# Patient Record
Sex: Female | Born: 1940 | Race: White | Hispanic: No | Smoking: Former smoker
Health system: Southern US, Community
[De-identification: ages and names within clinical notes are randomized; demographics above are authoritative.]

## PROBLEM LIST (undated history)

## (undated) DIAGNOSIS — I10 Essential (primary) hypertension: Secondary | ICD-10-CM

## (undated) DIAGNOSIS — F0391 Unspecified dementia with behavioral disturbance: Secondary | ICD-10-CM

## (undated) DIAGNOSIS — J069 Acute upper respiratory infection, unspecified: Secondary | ICD-10-CM

## (undated) DIAGNOSIS — F32A Depression, unspecified: Secondary | ICD-10-CM

## (undated) DIAGNOSIS — F419 Anxiety disorder, unspecified: Secondary | ICD-10-CM

## (undated) DIAGNOSIS — I639 Cerebral infarction, unspecified: Secondary | ICD-10-CM

## (undated) DIAGNOSIS — K2270 Barrett's esophagus without dysplasia: Secondary | ICD-10-CM

## (undated) DIAGNOSIS — I209 Angina pectoris, unspecified: Secondary | ICD-10-CM

## (undated) DIAGNOSIS — I2510 Atherosclerotic heart disease of native coronary artery without angina pectoris: Secondary | ICD-10-CM

## (undated) DIAGNOSIS — F329 Major depressive disorder, single episode, unspecified: Secondary | ICD-10-CM

## (undated) DIAGNOSIS — E785 Hyperlipidemia, unspecified: Secondary | ICD-10-CM

## (undated) DIAGNOSIS — F39 Unspecified mood [affective] disorder: Secondary | ICD-10-CM

## (undated) DIAGNOSIS — I6930 Unspecified sequelae of cerebral infarction: Secondary | ICD-10-CM

## (undated) DIAGNOSIS — I4891 Unspecified atrial fibrillation: Secondary | ICD-10-CM

## (undated) DIAGNOSIS — D649 Anemia, unspecified: Secondary | ICD-10-CM

## (undated) DIAGNOSIS — J45909 Unspecified asthma, uncomplicated: Secondary | ICD-10-CM

## (undated) DIAGNOSIS — J40 Bronchitis, not specified as acute or chronic: Secondary | ICD-10-CM

## (undated) DIAGNOSIS — I499 Cardiac arrhythmia, unspecified: Secondary | ICD-10-CM

## (undated) DIAGNOSIS — F22 Delusional disorders: Secondary | ICD-10-CM

## (undated) DIAGNOSIS — K219 Gastro-esophageal reflux disease without esophagitis: Secondary | ICD-10-CM

## (undated) DIAGNOSIS — C801 Malignant (primary) neoplasm, unspecified: Secondary | ICD-10-CM

## (undated) HISTORY — DX: Unspecified sequelae of cerebral infarction: I69.30

## (undated) HISTORY — DX: Barrett's esophagus without dysplasia: K22.70

## (undated) HISTORY — DX: Angina pectoris, unspecified: I20.9

## (undated) HISTORY — DX: Atherosclerotic heart disease of native coronary artery without angina pectoris: I25.10

## (undated) HISTORY — PX: PARTIAL COLECTOMY: SHX5273

## (undated) HISTORY — PX: CARDIAC CATHETERIZATION: SHX172

## (undated) HISTORY — DX: Malignant (primary) neoplasm, unspecified: C80.1

## (undated) HISTORY — PX: CAROTID ENDARTERECTOMY: SUR193

## (undated) HISTORY — DX: Anemia, unspecified: D64.9

## (undated) HISTORY — DX: Unspecified atrial fibrillation: I48.91

## (undated) HISTORY — DX: Unspecified asthma, uncomplicated: J45.909

## (undated) HISTORY — PX: CHOLECYSTECTOMY: SHX55

## (undated) HISTORY — DX: Hyperlipidemia, unspecified: E78.5

## (undated) HISTORY — PX: ABDOMINAL HYSTERECTOMY: SHX81

## (undated) HISTORY — DX: Unspecified mood (affective) disorder: F39

## (undated) HISTORY — DX: Delusional disorders: F22

## (undated) HISTORY — DX: Unspecified dementia with behavioral disturbance: F03.91

---

## 2005-03-01 ENCOUNTER — Encounter: Payer: Self-pay | Admitting: Gastroenterology

## 2005-04-14 ENCOUNTER — Encounter: Payer: Self-pay | Admitting: Gastroenterology

## 2005-05-25 ENCOUNTER — Encounter: Payer: Self-pay | Admitting: Gastroenterology

## 2005-08-09 ENCOUNTER — Encounter: Payer: Self-pay | Admitting: Gastroenterology

## 2006-02-05 ENCOUNTER — Emergency Department (HOSPITAL_COMMUNITY): Admission: EM | Admit: 2006-02-05 | Discharge: 2006-02-05 | Payer: Self-pay | Admitting: Family Medicine

## 2006-02-16 ENCOUNTER — Inpatient Hospital Stay (HOSPITAL_COMMUNITY)
Admission: RE | Admit: 2006-02-16 | Discharge: 2006-03-04 | Payer: Self-pay | Admitting: Physical Medicine & Rehabilitation

## 2006-02-16 ENCOUNTER — Ambulatory Visit: Payer: Self-pay | Admitting: Physical Medicine & Rehabilitation

## 2006-07-31 ENCOUNTER — Observation Stay (HOSPITAL_COMMUNITY): Admission: EM | Admit: 2006-07-31 | Discharge: 2006-08-01 | Payer: Self-pay | Admitting: Emergency Medicine

## 2007-04-03 ENCOUNTER — Ambulatory Visit: Payer: Self-pay | Admitting: Vascular Surgery

## 2010-04-15 ENCOUNTER — Encounter (INDEPENDENT_AMBULATORY_CARE_PROVIDER_SITE_OTHER): Payer: Self-pay | Admitting: *Deleted

## 2010-05-26 ENCOUNTER — Ambulatory Visit: Payer: Self-pay | Admitting: Gastroenterology

## 2010-06-04 ENCOUNTER — Encounter (INDEPENDENT_AMBULATORY_CARE_PROVIDER_SITE_OTHER): Payer: Self-pay | Admitting: *Deleted

## 2010-06-04 ENCOUNTER — Encounter: Payer: Self-pay | Admitting: Gastroenterology

## 2010-06-04 DIAGNOSIS — K2270 Barrett's esophagus without dysplasia: Secondary | ICD-10-CM | POA: Insufficient documentation

## 2010-07-29 ENCOUNTER — Ambulatory Visit: Payer: Self-pay | Admitting: Gastroenterology

## 2010-07-29 DIAGNOSIS — D649 Anemia, unspecified: Secondary | ICD-10-CM

## 2010-07-29 HISTORY — DX: Anemia, unspecified: D64.9

## 2010-07-29 LAB — CONVERTED CEMR LAB
Basophils Absolute: 0 10*3/uL (ref 0.0–0.1)
Eosinophils Absolute: 0.2 10*3/uL (ref 0.0–0.7)
HCT: 37.9 % (ref 36.0–46.0)
Hemoglobin: 12.9 g/dL (ref 12.0–15.0)
Lymphs Abs: 1.2 10*3/uL (ref 0.7–4.0)
MCHC: 33.9 g/dL (ref 30.0–36.0)
MCV: 86.3 fL (ref 78.0–100.0)
Monocytes Absolute: 0.4 10*3/uL (ref 0.1–1.0)
Neutro Abs: 3.6 10*3/uL (ref 1.4–7.7)
RDW: 13.6 % (ref 11.5–14.6)

## 2010-10-24 ENCOUNTER — Emergency Department (HOSPITAL_COMMUNITY): Admission: EM | Admit: 2010-10-24 | Discharge: 2010-10-24 | Payer: Self-pay | Admitting: Emergency Medicine

## 2010-12-29 NOTE — Procedures (Signed)
Summary: EGD/[facility]  [facility]   Imported By: Sherian Rein 06/04/2010 09:06:46  _____________________________________________________________________  External Attachment:    Type:   Image     Comment:   External Document

## 2010-12-29 NOTE — Procedures (Signed)
Summary: EGD/[facility]  [facility]   Imported By: Sherian Rein 06/04/2010 09:09:05  _____________________________________________________________________  External Attachment:    Type:   Image     Comment:   External Document

## 2010-12-29 NOTE — Procedures (Signed)
Summary: EGD/[facility]  [facility]   Imported By: Sherian Rein 06/04/2010 09:07:53  _____________________________________________________________________  External Attachment:    Type:   Image     Comment:   External Document

## 2010-12-29 NOTE — Letter (Signed)
Summary: New Patient letter  [facility]  [address] [place], [place] [address]   Phone: [phone]  Fax: [phone]       04/15/2010 MRN: [id number]  [facility] [address] [place], [place]  [address]  Dear Ms. Cindy [name],  Welcome to the Gastroenterology Division at [facility].    You are scheduled to see Dr.  Christella Hartigan on 05-26-10 at 10:30am on the 3rd floor at [facility], [address]  We ask that you try to arrive at our office 15 minutes prior to your appointment time to allow for check-in.  We would like you to complete the enclosed self-administered evaluation form prior to your visit and bring it with you on the day of your appointment.  We will review it with you.  Also, please bring a complete list of all your medications or, if you prefer, bring the medication bottles and we will list them.  Please bring your insurance card so that we may make a copy of it.  If your insurance requires a referral to see a specialist, please bring your referral form from your primary care physician.  Co-payments are due at the time of your visit and may be paid by cash, check or credit card.     Your office visit will consist of a consult with your physician (includes a physical exam), any laboratory testing he/she may order, scheduling of any necessary diagnostic testing (e.g. x-ray, ultrasound, CT-scan), and scheduling of a procedure (e.g. Endoscopy, Colonoscopy) if required.  Please allow enough time on your schedule to allow for any/all of these possibilities.    If you cannot keep your appointment, please call [phone] to cancel or reschedule prior to your appointment date.  This allows [place] the opportunity to schedule an appointment for another patient in need of care.  If you do not cancel or reschedule by 5 p.m. the business day prior to your appointment date, you will be charged a $50.00 late cancellation/no-show fee.    Thank you for choosing  [facility] for your medical needs.  We appreciate the opportunity to care for you.  Please visit [place] at our website  to learn more about our practice.                     Sincerely,                                                             The Gastroenterology Division

## 2010-12-29 NOTE — Procedures (Signed)
Summary: Colonoscopy/[facility]   Imported By: Sherian Rein 06/04/2010 09:10:10  _____________________________________________________________________  External Attachment:    Type:   Image     Comment:   External Document

## 2010-12-29 NOTE — Miscellaneous (Signed)
Summary: CBC  Clinical Lists Changes  Problems: Added new problem of ANEMIA (ICD-285.9) Orders: Added new Test order of TLB-CBC Platelet - w/Differential (85025-CBCD) - Signed

## 2010-12-29 NOTE — Miscellaneous (Signed)
Summary: Orders Update/EGD  Clinical Lists Changes  Problems: Added new problem of BARRETTS ESOPHAGUS (ICD-530.85) Orders: Added new Test order of EGD (EGD) - Signed

## 2010-12-29 NOTE — Procedures (Signed)
Summary: Upper Endoscopy  Patient: Cindy [name] Note: All result statuses are Final unless otherwise noted.  Tests: (1) Upper Endoscopy (EGD)   EGD Upper Endoscopy       DONE     [facility]     [address]     [place], [place]  [address]           ENDOSCOPY PROCEDURE REPORT     PATIENT:  Cindy, [name]  MR#:  [id number]     BIRTHDATE:  [birth year], 68 yrs. old  GENDER:  female     ENDOSCOPIST:  Rachael Fee, MD     Referred by:  Florentina Jenny, M.D.     PROCEDURE DATE:  07/29/2010     PROCEDURE:  EGD with biopsy     ASA CLASS:  Class II     INDICATIONS:  originally referred for routine colonoscopy.     However after obtaining and reviewing previous GI procedures I     determined next colonoscopy was not needed until 02/2015.  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  The LB GIF-H180 [id number] endoscope was     introduced through the mouth and advanced to the second portion of     the duodenum, without limitations.  The instrument was slowly     withdrawn as the mucosa was fully examined.     <<PROCEDUREIMAGES>>     There was a short segment (1-2cm) of non-nodular, Barrett's     appearing changes at GE junction. Several biopsies were taken to     check for dysplasia (see image1 and image8).  A hiatal hernia was     found. This was 3-4cm (see image2).  There was friable GAVE-like     changes in distal stomach that oozed blood with minor scope trauma     (see image3).  Otherwise the examination was normal (see image4,     image5, and image7).    Retroflexed views revealed no     abnormalities.    The scope was then withdrawn from the patient     and the procedure completed.     COMPLICATIONS:  None           ENDOSCOPIC IMPRESSION:     1) Non-nodular Barrett's changes (short segment) in distal     esophagus: biopsied     2) Hiatal hernia     3) Friable GAVE-like changes in distal stomach     4) Otherwise normal examination           RECOMMENDATIONS:     Will get a CBC today to see if she is anemic.  The GAVE changes     in distal stomach could certainly cause/contribute to anemia.  If     she is signficantly anemic, would likely arrange repeat EGD at [facility]     to treat with APC. This would have to be done off of coumadin.     She should stay on iron supplement daily, indefinitely.  ______________________________     Rachael Fee, MD           n.     eSIGNED:   Rachael Fee at 07/29/2010 11:05 AM           Sherolyn Buba, [id number]  Note: An exclamation mark (!) indicates a result that was not dispersed into the flowsheet. Document Creation Date: 07/29/2010 11:05 AM _______________________________________________________________________  (1) Order result status: Final Collection or observation date-time: 07/29/2010 10:53 Requested date-time:  Receipt date-time:  Reported date-time:  Referring Physician:   Ordering Physician: Rob Bunting [id number]) Specimen Source:  Source: Launa Grill Order Number: [id number] Lab site:

## 2010-12-29 NOTE — Letter (Signed)
Summary: EGD Instructions  [facility]  [address] [place], [place] [address]   Phone: [phone]  Fax: [phone]       Cindy [name]    05-17-1941    MRN: [id number]       Procedure Day /Date:07/29/10  WED     Arrival Time: 10 am     Procedure Time:11 am     Location of Procedure:                    [facility] (4th Floor)    PREPARATION FOR ENDOSCOPY   On 07/29/10  THE DAY OF THE PROCEDURE:  1.   No solid foods, milk or milk products are allowed after midnight the night before your procedure.  2.   Do not drink anything colored red or purple.  Avoid juices with pulp.  No orange juice.  3.  You may drink clear liquids until 9 am , which is 2 hours before your procedure.                                                                                                CLEAR LIQUIDS INCLUDE: Water Jello Ice Popsicles Tea (sugar ok, no milk/cream) Powdered fruit flavored drinks Coffee (sugar ok, no milk/cream) Gatorade Juice: apple, white grape, white cranberry  Lemonade Clear bullion, consomm, broth Carbonated beverages (any kind) Strained chicken noodle soup Hard Candy   MEDICATION INSTRUCTIONS  Unless otherwise instructed, you should take regular prescription medications with a small sip of water as early as possible the morning of your procedure.  Diabetic patients - see separate instructions.    Stay on Coumadin per Dr Christella Hartigan               OTHER INSTRUCTIONS  You will need a responsible adult at least 72 years of age to accompany you and drive you home.   This person must remain in the waiting room during your procedure.  Wear loose fitting clothing that is easily removed.  Leave jewelry and other valuables at home.  However, you may wish to bring a book to read or an iPod/MP3 player to listen to music as you wait for your procedure to start.  Remove all body piercing jewelry and leave at home.  Total time from  sign-in until discharge is approximately 2-3 hours.  You should go home directly after your procedure and rest.  You can resume normal activities the day after your procedure.  The day of your procedure you should not:   Drive   Make legal decisions   Operate machinery   Drink alcohol   Return to work  You will receive specific instructions about eating, activities and medications before you leave.    The above instructions have been reviewed and explained to me by   _______________________    I fully understand and can verbalize these instructions _____________________________ Date _________     Appended Document: EGD Instructions faxed to Attn 3rd floor [facility] living  [phone]  pt and staff aware

## 2010-12-29 NOTE — Assessment & Plan Note (Signed)
History of Present Illness Visit Type: Initial Consult Primary GI MD: Rob Bunting MD Requesting Provider: Florentina Jenny, MD Chief Complaint: colon screening on coumadin History of Present Illness:     very pleasant 70 year old woman who had a colonoscopy 5 years ago, she was told she had a hemorrhoids (this was in [place], done at  [facility]).  No polyps were noted.  No cancers.  She was told she needed another colonoscopy at 5 year interval and she was referred here to discuss that recommendation.  she has had several other colonoscopies in the past, cannot recall ever having polyp removed.    She has had no changes in her bowel, no bleeding, no constipation or bleeding.  she is on coumadin due to stroke.            Current Medications (verified): 1)  Acetaminophen 325 Mg Tabs (Acetaminophen) .... Every Evening 2)  Alendronate Sodium 70 Mg Tabs (Alendronate Sodium) .... Once Weekly 3)  Aspirin 81 Mg Tbec (Aspirin) .... Once Daily 4)  Citracal/vitamin D 250-200 Mg-Unit Tabs (Calcium Citrate-Vitamin D) .... Once Daily 5)  Klonopin 0.5 Mg Tabs (Clonazepam) .... Once Daily 6)  Depakote 125 Mg Tbec (Divalproex Sodium) .... At Bedtime 7)  Ferrous Sulfate 325 (65 Fe) Mg Tabs (Ferrous Sulfate) .... Once Daily 8)  Fluticasone Propionate 50 Mcg/act Susp (Fluticasone Propionate) .... 2 Puff Each Nostril Once Daily 9)  Lisinopril 10 Mg Tabs (Lisinopril) .... Once Daily 10)  Metoprolol Tartrate 50 Mg Tabs (Metoprolol Tartrate) .... Take 1 1/2 Tablet By Mouth  Every Morning 11)  Omeprazole 20 Mg Cpdr (Omeprazole) .... Take 2 Caps Daily 12)  Sertraline Hcl 100 Mg  Tabs (Sertraline Hcl) .... Qhs 13)  Proair Hfa 108 (90 Base) Mcg/act Aers (Albuterol Sulfate) .... Use Daily As Needed 14)  Simvastatin 40 Mg Tabs (Simvastatin) .... Once Daily 15)  Trazodone Hcl 100 Mg Tabs (Trazodone Hcl) .... Two Times A Day 16)  Coumadin 3 Mg Tabs (Warfarin Sodium) .... As Directed 17)   Ambien 5 Mg Tabs (Zolpidem Tartrate) .... At Bedtime  Allergies (verified): No Known Drug Allergies  Past History:  Past Medical History: stroke Elevated cholesterol Anxiety Carotid endarterectomy in 2007 Ischemic stroke Hypertension  Past Surgical History: small bowel resection due to ischemic event of some sort appy cholecystectomy hysterectomy carotid endarterectomy  Family History: no colon cancer  Social History: nondrinker Does not smoke cigarettes  Review of Systems       Pertinent positive and negative review of systems were noted in the above HPI and GI specific review of systems.  All other review of systems was otherwise negative.   Vital Signs:  Patient profile:   70 year old female Height:      60 inches Weight:      201.25 pounds BMI:     39.45 Pulse rate:   60 / minute Pulse rhythm:   regular BP sitting:   132 / 60  (left arm) Cuff size:   regular  Vitals Entered By: June McMurray CMA Duncan Dull) (May 26, 2010 10:12 AM)  Physical Exam  Additional Exam:  Constitutional: generally well appearing Psychiatric: alert and oriented times 3 Eyes: extraocular movements intact Mouth: oropharynx moist, no lesions Neck: supple, no lymphadenopathy Cardiovascular: heart regular rate and rythm Lungs: CTA bilaterally Abdomen: soft, non-tender, non-distended, no obvious ascites, no peritoneal signs, normal bowel sounds Extremities: no lower extremity edema bilaterally Skin: no lesions on visible extremities    Impression & Recommendations:  Problem # 1:  routine risk for colon cancer she believes she had a colonoscopy in April 2006. She has had several colonoscopies prior to that. She does not recall ever being told that she had colon polyps and certainly no colon cancer. The best that I can tell, she is at her routine risk for colon cancer. We will get records from her previous gastroenterologist sent over and if that is indeed the case, then she does not  require another colonoscopy until 2016.  If however, she has had polyps removed in the past, then five-year interval is most appropriate. If that were the case then we would have to contact her primary care physician about holding her Coumadin for 5 days prior to a colonoscopy.  Patient Instructions: 1)  We will get records from Dr. Hoyle Barr office to review your previous colonoscopy reports, pathology. This will determine when you need another rountine colonoscopy for cancer screening. 2)  A copy of this information will be sent to Dr. Florentina Jenny. 3)  The medication list was reviewed and reconciled.  All changed / newly prescribed medications were explained.  A complete medication list was provided to the patient / caregiver.  Appended Document:  received faxed notes: several EGDs and one colonoscoyp from 2005/2006.  she needs recall colonoscopy in 02/2015 for routine colon cancer screening. She has short segment Barretts and GAVE (treated with APC) proven on previous EGDs, the last EGD was in 2006.  patty, she will need repeat EGD in [facility] (for Barrett's surveillance). She can stay on the coumadin, but will need an INR checked the day prior to EGD to make sure her level isn't too high.  Appended Document: Recall colon   Clinical Lists Changes  Observations: Added new observation of COLONNXTDUE: 02/2015 (06/02/2010 14:50)      Appended Document:  pt will be scheduled for EGD and remain on coumadin  I spoke with the pt and her aide at [facility] assisting living and the instructions will be faxed to them attn tina. 3rd floor  [phone]

## 2010-12-29 NOTE — Procedures (Signed)
Summary: EGD/[facility]  [facility]   Imported By: Sherian Rein 06/04/2010 09:11:08  _____________________________________________________________________  External Attachment:    Type:   Image     Comment:   External Document

## 2011-02-09 LAB — CBC
MCHC: 31.9 g/dL (ref 30.0–36.0)
Platelets: 168 10*3/uL (ref 150–400)
RDW: 13.1 % (ref 11.5–15.5)
WBC: 5.5 10*3/uL (ref 4.0–10.5)

## 2011-02-09 LAB — BASIC METABOLIC PANEL
BUN: 14 mg/dL (ref 6–23)
Creatinine, Ser: 0.95 mg/dL (ref 0.4–1.2)
GFR calc non Af Amer: 58 mL/min — ABNORMAL LOW (ref >60)

## 2011-02-09 LAB — PROTIME-INR: INR: 1.84 — ABNORMAL HIGH (ref 0.00–1.49)

## 2011-02-09 LAB — POCT CARDIAC MARKERS

## 2011-04-16 NOTE — Discharge Summary (Signed)
NAMEJANAIAH, Cindy [name] NO.:  [id number]   MEDICAL RECORD NO.:  [id number]          PATIENT TYPE:  IPS   LOCATION:  4027                         FACILITY:  [facility]   PHYSICIAN:  Ranelle Oyster, M.D.DATE OF BIRTH:  1941-04-15   DATE OF ADMISSION:  02/16/2006  DATE OF DISCHARGE:  03/04/2006                                 DISCHARGE SUMMARY   DISCHARGE DIAGNOSES:  1.  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  2.  Pain management.  3.  Hypertension.  4.  Hyperlipidemia.  5.  History of transient ischemic attack.  6.  Anxiety with depression.  7.  Irritable bowel syndrome.   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  The  patient seen at [facility] for planned left carotid surgery.  Preoperative stress test positive.  Cardiac catheterization per Dr. Lodema Hong  revealed a 50% left main and circumflex disease.  She was cleared for  surgery.  Underwent left carotid surgery March 15 per Dr. Keitha Butte.  Intraoperative right-sided weakness.  Underwent coronary angiogram with no  obvious embolism and intraoperative urokinase was given.  Postoperative  cranial CT scan with watershed ischemic infarction.  Follow-up neurology  services, Dr. Lynnea Ferrier, and placed on Aggrenox therapy.  Follow-up cranial CT  scan negative for hemorrhage.  Blood pressure monitored with metoprolol and  Altace.  She was admitted for comprehensive rehab program.   PAST MEDICAL HISTORY:  1.  See discharge diagnoses.  2.  Remote smoker.  3.  No alcohol.   SOCIAL HISTORY:  Lives alone in [place],  [place].  Daughter in [place] but  can assist as needed.  No other local family.   ALLERGIES:  NONE.   MEDICATIONS:  Prior to admission, unknown.   REHABILITATION HOSPITAL COURSE:  The patient was admitted to inpatient rehab  services with therapies initiated on a b.i.d. basis consisting of physical  therapy, occupational therapy, speech therapy and rehabilitation nursing.  The following issues were addressed during the patient's rehabilitation  stay.  Pertaining to Mrs. Commisso's watershed ischemic infarction, right-sided  weakness and aphasia after carotid surgery.  She continued to progress in  all areas.  Strength was graded 3+ to 4/5, right lower extremity, 3+/5 right  lower extremity.  She was now close supervision for ambulation, needing some  cues for safety.  Side rails had been up x 4 for patient safety and limited  awareness.  She was minimal assist for bathing and dressing.  She had a 50%  comprehension for short sentences.  She remained on Aggrenox for CVA  prophylaxis.  Subcutaneous Lovenox for deep vein thrombosis prophylaxis was  discontinued at time of discharge.  Her blood pressures were well-controlled  with Altace and Lopressor  with no orthostatic changes.  She would remain on  Vytorin for hyperlipidemia.  During her rehabilitation course, she was  placed on Lexapro March 28 for depression after cerebrovascular accident  although her moods had greatly improved.  She was resting well at night.  Her appetite continued to improve.  She was also using Xanax as needed.  Plan is to be discharged March 04, 2006 to assisted living facility.   Latest labs showed a hemoglobin 10.4, hematocrit 30.5, platelets 356,000,  WBC 9.4.  Sodium 137, potassium 4.1, BUN 18, creatinine 1.1.   DISCHARGE MEDICATIONS AT TIME OF DICTATION:  Included:  1.  Aggrenox one capsule p.o. b.i.d.  2.  Altace 10 mg p.o. daily.  3.  Protonix 40 mg p.o. daily.  4.  Vytorin 10/40 one tablet p.o. q.h.s.  5.   Lopressor 50 mg p.o. b.i.d.  6.  Tylenol 650 mg p.o. q.4h. p.r.n. pain.  7.  Albuterol inhaler 90 mcg two puffs every 4 hours as needed.  8.  Lexapro 10 mg p.o. q.h.s.  9.  Xanax 0.5 mg p.o. q.h.s. as needed.   ACTIVITY:  As tolerated.   DIET:  Regular.   SPECIAL INSTRUCTIONS:  Continue therapies to promote overall mobility and  well-being.  She would follow up with Dr. Faith Rogue at the outpatient  rehab service office as advised.      Mariam Dollar, P.A.      Ranelle Oyster, M.D.  Electronically Signed    DA/MEDQ  D:  03/04/2006  T:  03/04/2006  Job:  [id number]

## 2011-04-16 NOTE — H&P (Signed)
Cindy, [name] NO.:  [id number]   MEDICAL RECORD NO.:  [id number]          PATIENT TYPE:  IPS   LOCATION:  4027                         FACILITY:  [facility]   PHYSICIAN:  Ranelle Oyster, M.D.DATE OF BIRTH:  1941/08/04   DATE OF ADMISSION:  02/16/2006  DATE OF DISCHARGE:                                HISTORY & PHYSICAL   CHIEF COMPLAINT:  Right-sided weakness, aphasia.   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 She ultimately was admitted for elective  left CEA to [facility]. Perioperatively, the patient developed  right hemiparesis and difficulty with speech. Postoperative CT revealed a  watershed ischemic infarct. The patient may have had some adjustment to her  Aggrenox at the time of the stroke, although it is unclear by the  documentation we have available. It appears the patient was on Aggrenox at  home prior to arrival. The patient had no hemorrhage so there was no  contraindication for further anticoagulation. She was managed supportively  and has been showing some improvements in regards to her right-sided motor  function and speech.   REVIEW OF SYSTEMS:  Positive for some dizziness, anxiety. Other pertinent  positives are listed above. Full review is in the written H&P.   PAST MEDICAL HISTORY:  Positive for:  1.  Hypertension.  2.  Hyperlipidemia.  3.  Obesity.  4.  Irritable bowel syndrome.  5.  TIA.  6.  Hysterectomy.  7.  Anxiety.  8.  Remote tobacco use.   FAMILY HISTORY:  Positive for coronary artery disease, cervical cancer,  diabetes, and hypertension.   SOCIAL HISTORY:  The patient lives alone in [place].  Daughter lives in [place] and works but can assist her as needed. She  does not have any other local family. The patient was completely independent  prior to arrival.   MEDICATIONS PRIOR TO ARRIVAL:  Apparently include Altace, omeprazole, Xanax,  Vytorin, Aggrenox, and metoprolol.   ALLERGIES:  None.   Recent laboratory values include hemoglobin 9.7; white count 8.0; platelets  198,000. Sodium 134, potassium 3.9, BUN and creatinine 21 and 0.9.   PHYSICAL EXAMINATION:  VITAL SIGNS:  Blood pressure is 124/82, pulse is 74,  respiratory rate is 16, temperature is afebrile.  GENERAL:  The patient is pleasant, in no acute distress. She is able to  follow simple one-step commands for me today.  HEENT:  Ear, nose, and throat exam was unremarkable. Oral mucosa is pink and  moist.  NECK:  Supple without JVD or lymphadenopathy.  CHEST:  Clear to auscultation bilaterally without wheezes, rales, or  rhonchi.  HEART:  Regular rate and rhythm without murmur, rubs, or gallops.  ABDOMEN:  Soft, nontender, bowel sounds are positive.  EXTREMITIES:  Showed no clubbing, cyanosis, or edema.  NEUROLOGIC:  Cranial nerves II-XII revealed a right central VII, right  tongue deviation. She appeared to have good visual acuity and scanning  abilities. No focal field deficits  were noted today. As noted above, the  patient was able to follow simple commands. She had notable expressive  aphasia with minimal receptive component today. Judgment, orientation, and  memory are difficult to assess for the most part, although she seemed  generally alert. Mood was good. Motor function in the right upper extremity  is 2/5 to 3-/5. Right lower extremity is 3+/5 to 4/5. Reflexes are 2+. The  patient appeared to have general preservation of her sensory function with  good withdrawal to pain stimulus.  SKIN:  Intact. Pulses are 2+.   ASSESSMENT AND PLAN:  1.  Functional deficits secondary to left watershed stroke related to her      carotid endarterectomy and internal carotid artery stenosis on the left.      Begin comprehensive inpatient rehab with PT to assess the  patient for      mobility, lower extremity strengthening. OT to assess for upper      extremity strength and self care. Speech will follow the patient for      swallowing, language, and cognitive deficits. Nursing will manage bowel,      bladder, skin, and medication issues. Nurse case manager and social work      will assess for psychosocial needs. Estimated length of stay is 2 weeks.      Goal is modified independent/supervision.  Prognosis good.  2.  Pain management with Vicodin p.r.n. as well as Tylenol p.r.n.  3.  Deep venous thrombosis prophylaxis with thigh-high TED hose and Lovenox.  4.  Hypertension. Continue metoprolol and Altace.  5.  Anticoagulation:  Aggrenox.  6.  Anxiety:  Xanax. Will monitor the patient clinically.  7.  Observe bowel and bladder function.      Ranelle Oyster, M.D.  Electronically Signed     ZTS/MEDQ  D:  02/16/2006  T:  02/17/2006  Job:  [id number]

## 2011-04-16 NOTE — H&P (Signed)
NAMEROXANA, LAI NO.:  [id number]   MEDICAL RECORD NO.:  [id number]          PATIENT TYPE:  INP   LOCATION:  1825                         FACILITY:  [facility]   PHYSICIAN:  Elmore Guise., M.D.DATE OF BIRTH:  [birth year]   DATE OF ADMISSION:  07/31/2006  DATE OF DISCHARGE:                                HISTORY & PHYSICAL   INDICATION FOR ADMISSION:  Chest pain.   PRIMARY CARDIOLOGIST:  Corky Crafts, M.D., [facility]   HISTORY OF PRESENT ILLNESS:  The patient is a very pleasant 70 year old  white female with past medical history of coronary artery disease, recent  left-sided CVA stroke, peripheral vascular disease (status post carotid  endarterectomy), dyslipidemia, obesity, who presented with acute onset of  epigastric or sternal chest pain.  History is obtained from the patient and  her daughter. Both report the patient was in normal state of health.  She  had a significant hospitalization after her stroke back in April.  Following  that, she moved to [place] be closer with her family. She initially had  significant dysarthria as well as total right-sided near paralysis which is  now almost at total resolution.  Since being discharged from the hospital,  she would do her normal activities without any significant pain. She does  have a history of obstructive coronary disease and, by report, was being set  up for a coronary artery bypass surgery in the future.  Today she went to  church, doing her normal activities. While there, she started having acute  onset of indigestion.  This was followed by epigastric and lower sternal  chest pain associated with shortness of breath, diaphoresis and nausea.  Her  symptoms lasted approximately 30 minutes.  She denies any exertional  worsening. She told her daughter. Her daughter took her church, called EMS,  and the patient was then brought to the emergency department for further  evaluation.  Her  symptoms resolved after starting supplemental oxygen.   She denies any recent fever, cough, orthopnea, PND, palpitations or  significant lower extremity edema.  She does report she has a limited  functional capacity of less than one block secondary to shortness of breath.   CURRENT MEDICATIONS:  1. Advair discus 250/50 one puff twice daily.  2. Trazodone 50 mg daily.  3. Vitorin 10/40 mg daily.  4. Tums p.r.n.  5. Albuterol p.r.n.  6. Tylenol p.r.n.  7. Xanax p.r.n.  8. Prilosec 20 mg daily.  9. Claritin 10 mg daily.  10.Coumadin 5 mg daily.  11.Lopressor 25 mg twice daily.  12.Iron sulfate 325 mg twice daily.  13.Aspirin 81 mg daily.   ALLERGIES:  None.   FAMILY HISTORY:  Is positive for heart disease, stroke, diabetes.   SOCIAL HISTORY:  She lives in assisted living. No tobacco or alcohol.   PHYSICAL EXAMINATION:  VITAL SIGNS:  She is afebrile.  Temperature is 98.6,  blood pressure 151/73, heart rate 57.  She is saturating 96% on room air.  GENERAL: She is a very pleasant, elderly white female, alert and oriented  x4.  No  acute distress.  NECK:  She has no JVD, 2+ carotids.  LUNGS: Were clear.  HEART: Is regular with a normal S1-S2 soft flow murmur noted.  ABDOMEN: Obese, soft, nontender, nondistended. No rebound or guarding.  EXTREMITIES:  Warm with no significant edema.  NEUROLOGIC: She is slightly weaker on the right; however, cranial nerves are  intact, and she has full range of motion in her upper and lower extremities.   LABORATORY DATA:  Shows a white count of 7.9, hemoglobin 13.2, platelet  count of 244.  INR is 2.7.  Myoglobin is 71. CPK-MB is 1.5.  Troponin I is  less than 0.05.  Her BUN and creatinine are 23 and 0.9, respectively.  Potassium level is 4.5.   Her x-rays show no acute cardiopulmonary disease.  She does have a very  small blunting of her costophrenic angles.   ECG shows normal sinus rhythm, 60 per minute, with occasional PVCs. No  significant  ST or T-wave changes noted.   IMPRESSION:  1. Acute onset of chest pain, now resolved.  2. History of known coronary artery disease.  3. History of cerebrovascular accident approximately 6 months ago (on      Coumadin now).  4. Hypertension.  5. Obesity.   PLAN:  1. The patient will be admitted for observation.  We will continue serial      cardiac enzymes.  The patient will have Lopressor, nitroglycerin p.r.n.      Will add low-dose ACE inhibitor. If no further symptoms, likely      discharge in a.m. with good outpatient followup with Dr. Eldridge Dace;      however, should her symptoms continue, will  continue her hospital      evaluation as indicated.  Will also try to get her outpatient hospital      records from [place] regarding her catheterization back in April.      Elmore Guise., M.D.  Electronically Signed     TWK/MEDQ  D:  07/31/2006  T:  07/31/2006  Job:  [id number]   cc:   Corky Crafts, MD

## 2011-04-16 NOTE — Discharge Summary (Signed)
Cindy [name], ROAT NO.:  [id number]   MEDICAL RECORD NO.:  [id number]          PATIENT TYPE:  INP   LOCATION:  2005                         FACILITY:  [facility]   PHYSICIAN:  Elmore Guise., M.D.DATE OF BIRTH:  Aug 14, 1941   DATE OF ADMISSION:  07/31/2006  DATE OF DISCHARGE:  08/01/2006                                 DISCHARGE SUMMARY   DISCHARGE DIAGNOSES:  1. History of coronary artery disease.  2. History of recent cerebrovascular accident, now with almost total      resolution of her right-sided weakness.  3. Dyslipidemia.  4. Gastroesophageal reflux disease.  5. History of diastolic dysfunction.   HISTORY OF PRESENT ILLNESS:  The patient is a very pleasant 70 year old  white female who was admitted with a single episode of chest pain.   HOSPITAL COURSE:  Patient was admitted for observation.  She was started on  Lisinopril for hypertension and likely diastolic dysfunction.  She remained  chest pain free throughout her hospitalization.  Her cardiac enzymes and EKG  were normal.  She was discharged on August 01, 2006.   DISCHARGE MEDICATIONS:  1. Advair 250/50, one puff twice daily.  2. Trazodone 50 mg q.h.s.  3. Vytorin 10/40 mg once daily.  4. Prilosec 20 mg daily.  5. Claritin 10 mg daily.  6. Coumadin as before.  7. Lopressor 25 mg twice daily.  8. Iron sulfate 325 mg twice daily.  9. Aspirin 325 mg daily.  10.Lisinopril 5 mg daily.  11.Nitroglycerin on a p.r.n. basis.   The patient will follow up with Dr. Everette Rank with [facility] in  the next 1 to 2 weeks for a routine office visit and BMP.  Her diet should  be low cholesterol, low salt.  She is to slowly increase her activity as  tolerated.  She is to notify the office, should she have any further  problems prior to seeing Dr. Eldridge Dace.  All of her instructions were  discussed with the patient, as well as her daughter at length.      Elmore Guise., M.D.  Electronically Signed    TWK/MEDQ  D:  08/02/2006  T:  08/02/2006  Job:  [id number]

## 2011-10-25 ENCOUNTER — Ambulatory Visit (HOSPITAL_COMMUNITY): Admission: RE | Admit: 2011-10-25 | Payer: Medicare Other | Source: Ambulatory Visit | Admitting: Gastroenterology

## 2011-10-25 ENCOUNTER — Encounter (HOSPITAL_COMMUNITY): Admission: RE | Payer: Self-pay | Source: Ambulatory Visit

## 2011-10-25 SURGERY — COLONOSCOPY
Anesthesia: Moderate Sedation

## 2011-12-01 ENCOUNTER — Ambulatory Visit (HOSPITAL_COMMUNITY)
Admission: RE | Admit: 2011-12-01 | Discharge: 2011-12-01 | Disposition: A | Discharge status: Home / Self Care | Payer: Medicare Other | Source: Ambulatory Visit | Attending: Gastroenterology | Admitting: Gastroenterology

## 2011-12-01 ENCOUNTER — Other Ambulatory Visit: Payer: Self-pay | Admitting: Gastroenterology

## 2011-12-01 ENCOUNTER — Encounter (HOSPITAL_COMMUNITY): Payer: Self-pay | Admitting: *Deleted

## 2011-12-01 ENCOUNTER — Encounter (HOSPITAL_COMMUNITY)
Admission: RE | Disposition: A | Discharge status: Home / Self Care | Payer: Self-pay | Source: Ambulatory Visit | Attending: Gastroenterology

## 2011-12-01 DIAGNOSIS — K648 Other hemorrhoids: Secondary | ICD-10-CM | POA: Insufficient documentation

## 2011-12-01 DIAGNOSIS — D509 Iron deficiency anemia, unspecified: Secondary | ICD-10-CM | POA: Insufficient documentation

## 2011-12-01 DIAGNOSIS — K5730 Diverticulosis of large intestine without perforation or abscess without bleeding: Secondary | ICD-10-CM | POA: Insufficient documentation

## 2011-12-01 DIAGNOSIS — D126 Benign neoplasm of colon, unspecified: Secondary | ICD-10-CM | POA: Insufficient documentation

## 2011-12-01 DIAGNOSIS — K449 Diaphragmatic hernia without obstruction or gangrene: Secondary | ICD-10-CM | POA: Insufficient documentation

## 2011-12-01 DIAGNOSIS — K219 Gastro-esophageal reflux disease without esophagitis: Secondary | ICD-10-CM | POA: Insufficient documentation

## 2011-12-01 DIAGNOSIS — Z981 Arthrodesis status: Secondary | ICD-10-CM | POA: Insufficient documentation

## 2011-12-01 DIAGNOSIS — K31811 Angiodysplasia of stomach and duodenum with bleeding: Secondary | ICD-10-CM | POA: Insufficient documentation

## 2011-12-01 HISTORY — PX: COLONOSCOPY: SHX5424

## 2011-12-01 HISTORY — PX: HOT HEMOSTASIS: SHX5433

## 2011-12-01 HISTORY — DX: Cerebral infarction, unspecified: I63.9

## 2011-12-01 HISTORY — DX: Major depressive disorder, single episode, unspecified: F32.9

## 2011-12-01 HISTORY — DX: Acute upper respiratory infection, unspecified: J06.9

## 2011-12-01 HISTORY — DX: Essential (primary) hypertension: I10

## 2011-12-01 HISTORY — DX: Depression, unspecified: F32.A

## 2011-12-01 HISTORY — DX: Bronchitis, not specified as acute or chronic: J40

## 2011-12-01 HISTORY — DX: Gastro-esophageal reflux disease without esophagitis: K21.9

## 2011-12-01 HISTORY — DX: Anxiety disorder, unspecified: F41.9

## 2011-12-01 HISTORY — PX: ESOPHAGOGASTRODUODENOSCOPY: SHX5428

## 2011-12-01 SURGERY — EGD (ESOPHAGOGASTRODUODENOSCOPY)
Anesthesia: Moderate Sedation

## 2011-12-01 MED ORDER — MIDAZOLAM HCL 10 MG/2ML IJ SOLN
INTRAMUSCULAR | Status: AC
  Filled 2011-12-01: qty 4

## 2011-12-01 MED ORDER — FENTANYL CITRATE 0.05 MG/ML IJ SOLN
INTRAMUSCULAR | Status: AC
  Filled 2011-12-01: qty 4

## 2011-12-01 MED ORDER — MIDAZOLAM HCL 10 MG/2ML IJ SOLN
INTRAMUSCULAR | Status: DC | PRN
  Administered 2011-12-01 (×3): 2 mg via INTRAVENOUS
  Administered 2011-12-01 (×2): 1 mg via INTRAVENOUS
  Administered 2011-12-01: 2 mg via INTRAVENOUS
  Administered 2011-12-01 (×4): 1 mg via INTRAVENOUS

## 2011-12-01 MED ORDER — SODIUM CHLORIDE 0.9 % IV SOLN
Freq: Once | INTRAVENOUS | Status: AC
  Administered 2011-12-01: 500 mL via INTRAVENOUS

## 2011-12-01 MED ORDER — BUTAMBEN-TETRACAINE-BENZOCAINE 2-2-14 % EX AERO
INHALATION_SPRAY | CUTANEOUS | Status: DC | PRN
  Administered 2011-12-01: 2 via TOPICAL

## 2011-12-01 MED ORDER — FENTANYL NICU IV SYRINGE 50 MCG/ML
INJECTION | INTRAMUSCULAR | Status: DC | PRN
  Administered 2011-12-01 (×5): 25 ug via INTRAVENOUS

## 2011-12-01 NOTE — H&P (Signed)
  See scanned H and P. History of Anemia and gastric vascular antral ectasia.  Needs colonoscopy and EGD reevaluate.

## 2011-12-01 NOTE — Op Note (Addendum)
[facility] [address] [place], [place]  [address]  COLONOSCOPY PROCEDURE REPORT  PATIENT:  Cindy [name], Cindy [name]  MR#:  [id number] BIRTHDATE:  09/04/1941, 70 yrs. old  GENDER:  female ENDOSCOPIST:  Charlott Rakes, MD REF. BY:  Florentina Jenny, M.D. PROCEDURE DATE:  12/01/2011 PROCEDURE:  Colonoscopy with snare polypectomy ASA CLASS:  Class III INDICATIONS:  blood in stool MEDICATIONS:   Fentanyl 25 mcg IV, Versed 4 mg IV, additional medicine given during preceding EGD  DESCRIPTION OF PROCEDURE:   After the risks benefits and alternatives of the procedure were thoroughly explained, informed consent was obtained.  The Pentax Ped Colon [id number] endoscope was introduced through the anus and advanced to the cecum, which was identified by both the appendix and ileocecal valve, limited by a tortuous colon, looping difficulties.    The quality of the prep was fair.  The instrument was then slowly withdrawn as the colon was fully examined. <<PROCEDUREIMAGES>>  FINDINGS:  Rectal exam unremarkable.  Pediatric colonoscope inserted into the colon and advanced to the cecum, where the appendiceal orifice and ileocecal valve were identified.    On insertion a colocolonic anastomosis was noted in the transverse colon. The terminal ileum was intubated and was normal in appearance. On careful withdrawal of the colonoscope a 1.2 cm pedunculated polyp was noted in the sigmoid colon that was removed with snare cautery.   Diffused sigmoid diverticuli noted on insertion and withdrawal. Retroflexion revealed small internal hemorrhoids.  COMPLICATIONS:  None  IMPRESSION:   1. Pedunculated sigmoid polyp removed with snare cautery 2. Small internal hemorrhoids 3. Evidence of previous surgery with a colocolonic anastomosis in the mid-colon 4. Sigmoid diverticulosis  RECOMMENDATIONS:   1. Hold coumadin for 5 days 2. F/U on path 3. No aspirin products for 14 days 4. High fiber  diet  ______________________________ Charlott Rakes, MD  CC:  n. REVISED:  12/01/2011 12:32 PM eSIGNED:   Charlott Rakes at 12/01/2011 12:32 PM  Sherolyn Buba, [phone]

## 2011-12-01 NOTE — Brief Op Note (Signed)
See Endopro notes.

## 2011-12-01 NOTE — Op Note (Signed)
[facility] [address] [place], [place]  [address]  ENDOSCOPY PROCEDURE REPORT  PATIENT:  Cindy, [name]  MR#:  [id number] BIRTHDATE:  September 10, 1941, 70 yrs. old  GENDER:  female  ENDOSCOPIST:  Charlott Rakes, MD Referred by:  Florentina Jenny, M.D.  PROCEDURE DATE:  12/01/2011 PROCEDURE:  EGD for control of bleeding, EGD with lesion ablation ASA CLASS:  Class III INDICATIONS:  iron deficiency anemia, h/o Barrett's Esophagus, blood in stool  MEDICATIONS:   Fentanyl 100 mcg IV, Versed 10 mg IV  TOPICAL ANESTHETIC:  Cetacaine spray X 2  DESCRIPTION OF PROCEDURE:   After the risks benefits and alternatives of the procedure were thoroughly explained, informed consent was obtained.  The [id number] endoscope was introduced through the mouth and advanced to the second portion of the duodenum, without limitations.  The instrument was slowly withdrawn as the mucosa was fully examined. <<PROCEDUREIMAGES>>  FINDINGS:  The endoscope was inserted into the oropharynx and esophagus was intubated.  In the distal esophagus there was circumferential erythema and a mildly stenotic gastroesophageal junction. The endoscope was able to advance to this area without resistance. Endoscope was advanced into the stomach which revealed moderate gastric antral vascular ectasias with a small amount of oozing from part of this affected area. Retroflexion revealed small hiatal hernia with mild proximal erythema of gastric mucosa. The endoscope was advanced to the duodenal bulb and second portion of duodenum which were unremarkable.  The endoscope was withdrawn back into the stomach and argon plasma coagulation was used to fulgurate portions of the GAVE with successful results. On withdrawal the endoscope no Barrett's esophagus was noted.  COMPLICATIONS:  None  ENDOSCOPIC IMPRESSION:  1. 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 Small hiatal hernia  RECOMMENDATIONS:   1. Hold Coumadin  for another 5 days and then resume 2. No aspirin products for 2 weeks  REPEAT EXAM:  N/A  ______________________________ Charlott Rakes, MD  CC:  n. eSIGNEDCharlott Rakes at 12/01/2011 12:04 PM  Sherolyn Buba, [phone]

## 2011-12-01 NOTE — Interval H&P Note (Signed)
History and Physical Interval Note:  12/01/2011 10:31 AM  Cindy [name]  has presented today for surgery, with the diagnosis of reflux/blood in stool  The various methods of treatment have been discussed with the patient and family. After consideration of risks, benefits and other options for treatment, the patient has consented to  Procedure(s): ESOPHAGOGASTRODUODENOSCOPY (EGD) COLONOSCOPY HOT HEMOSTASIS (ARGON PLASMA COAGULATION/BICAP) as a surgical intervention .  The patients' history has been reviewed, patient examined, no change in status, stable for surgery.  I have reviewed the patients' chart and labs.  Questions were answered to the patient's satisfaction.     Janiyla Long C.

## 2011-12-02 ENCOUNTER — Encounter (HOSPITAL_COMMUNITY): Payer: Self-pay

## 2011-12-02 ENCOUNTER — Encounter (HOSPITAL_COMMUNITY): Payer: Self-pay | Admitting: Gastroenterology

## 2011-12-27 NOTE — H&P (Signed)
Cindy [name] is an 71 y.o. female.   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 Christella Hartigan. She has occasional blood on the toilet tissue with wiping. She has reflux that is well-controlled with Omeprazole. She is on chronic Coumadin for atrial fibrillation. Last had a colonoscopy in 2006. Hgb 11.5 in September 2012.  Past Medical History  Diagnosis Date  . Stroke     2007  . Asthma   . Bronchitis   . Hypertension   . Recurrent upper respiratory infection (URI)   . GERD (gastroesophageal reflux disease)   . Depression   . Anxiety     Past Surgical History  Procedure Date  . Carotid artery angioplasty   . Abdominal hysterectomy   . Cholecystectomy   . Partial colectomy   . Esophagogastroduodenoscopy 12/01/2011    Procedure: ESOPHAGOGASTRODUODENOSCOPY (EGD);  Surgeon: Kearia Friar, MD;  Location: [facility];  Service: Endoscopy;  Laterality: N/A;  . Colonoscopy 12/01/2011    Procedure: COLONOSCOPY;  Surgeon: Shoshanna Friar, MD;  Location: [facility] END[facility]COPY;  Service: Endoscopy;  Laterality: N/A;  . Hot hemostasis 12/01/2011    Procedure: HOT HEMOSTASIS (ARGON PLASMA COAGULATION/BICAP);  Surgeon: Lashika Friar, MD;  Location: [facility];  Service: Endoscopy;  Laterality: N/A;    No prescriptions prior to admission    Allergies: No Known Allergies  History reviewed. No pertinent family history.  Social History:  reports that she has quit smoking. She does not have any smokeless tobacco history on file. She reports that she does not drink alcohol or use illicit drugs.   ROS: Negative except as stated in HPI    VITALS: VS: Afebrile, VSS PHYSICAL EXAM: General appearance: alert, well-nourished, no acute distress HENT: oropharynx clear, no erythema Eyes: anicteric Neck: no  lymphadenopathy, nontender Skin: no jaundice Resp: clear to ascultation bilaterally Cardio: regular, rate, and rhythm no murmurs, rubs, or gallops GI: soft, nontender, nondistended Extremities: no lower extremity edema, no cyanosis Neuro: no asterixis   Assessment/Plan 70yo with history of anemia and blood in stool with past history of GAVE and Barrett's esophagus in need of a repeat colonoscopy/EGD with possible APC. Procedures will be done off Coumadin for 5 days prior to the procedure.  [name] 12/27/2011, 8:44 AM

## 2012-01-07 ENCOUNTER — Emergency Department (HOSPITAL_COMMUNITY): Payer: Medicare Other

## 2012-01-07 ENCOUNTER — Observation Stay (HOSPITAL_COMMUNITY)
Admission: EM | Admit: 2012-01-07 | Discharge: 2012-01-08 | Payer: Medicare Other | Attending: Internal Medicine | Admitting: Internal Medicine

## 2012-01-07 ENCOUNTER — Encounter (HOSPITAL_COMMUNITY): Payer: Self-pay

## 2012-01-07 ENCOUNTER — Other Ambulatory Visit: Payer: Self-pay

## 2012-01-07 DIAGNOSIS — X58XXXA Exposure to other specified factors, initial encounter: Secondary | ICD-10-CM | POA: Insufficient documentation

## 2012-01-07 DIAGNOSIS — I4891 Unspecified atrial fibrillation: Secondary | ICD-10-CM | POA: Diagnosis present

## 2012-01-07 DIAGNOSIS — E785 Hyperlipidemia, unspecified: Secondary | ICD-10-CM | POA: Insufficient documentation

## 2012-01-07 DIAGNOSIS — Z23 Encounter for immunization: Secondary | ICD-10-CM | POA: Insufficient documentation

## 2012-01-07 DIAGNOSIS — K2270 Barrett's esophagus without dysplasia: Secondary | ICD-10-CM | POA: Diagnosis present

## 2012-01-07 DIAGNOSIS — J45909 Unspecified asthma, uncomplicated: Secondary | ICD-10-CM

## 2012-01-07 DIAGNOSIS — IMO0002 Reserved for concepts with insufficient information to code with codable children: Secondary | ICD-10-CM | POA: Insufficient documentation

## 2012-01-07 DIAGNOSIS — R079 Chest pain, unspecified: Principal | ICD-10-CM | POA: Diagnosis present

## 2012-01-07 DIAGNOSIS — K219 Gastro-esophageal reflux disease without esophagitis: Secondary | ICD-10-CM | POA: Diagnosis present

## 2012-01-07 DIAGNOSIS — D649 Anemia, unspecified: Secondary | ICD-10-CM | POA: Diagnosis present

## 2012-01-07 DIAGNOSIS — R0602 Shortness of breath: Secondary | ICD-10-CM | POA: Insufficient documentation

## 2012-01-07 DIAGNOSIS — I6930 Unspecified sequelae of cerebral infarction: Secondary | ICD-10-CM

## 2012-01-07 DIAGNOSIS — Z7901 Long term (current) use of anticoagulants: Secondary | ICD-10-CM | POA: Insufficient documentation

## 2012-01-07 DIAGNOSIS — I10 Essential (primary) hypertension: Secondary | ICD-10-CM | POA: Insufficient documentation

## 2012-01-07 DIAGNOSIS — F419 Anxiety disorder, unspecified: Secondary | ICD-10-CM | POA: Diagnosis present

## 2012-01-07 DIAGNOSIS — F39 Unspecified mood [affective] disorder: Secondary | ICD-10-CM | POA: Diagnosis present

## 2012-01-07 DIAGNOSIS — I110 Hypertensive heart disease with heart failure: Secondary | ICD-10-CM | POA: Diagnosis present

## 2012-01-07 DIAGNOSIS — D509 Iron deficiency anemia, unspecified: Secondary | ICD-10-CM | POA: Insufficient documentation

## 2012-01-07 DIAGNOSIS — I639 Cerebral infarction, unspecified: Secondary | ICD-10-CM | POA: Diagnosis present

## 2012-01-07 DIAGNOSIS — Z8673 Personal history of transient ischemic attack (TIA), and cerebral infarction without residual deficits: Secondary | ICD-10-CM | POA: Insufficient documentation

## 2012-01-07 HISTORY — DX: Unspecified asthma, uncomplicated: J45.909

## 2012-01-07 HISTORY — DX: Unspecified atrial fibrillation: I48.91

## 2012-01-07 HISTORY — DX: Unspecified sequelae of cerebral infarction: I69.30

## 2012-01-07 HISTORY — DX: Cardiac arrhythmia, unspecified: I49.9

## 2012-01-07 LAB — CARDIAC PANEL(CRET KIN+CKTOT+MB+TROPI)
CK, MB: 2.6 ng/mL (ref 0.3–4.0)
Relative Index: INVALID (ref 0.0–2.5)
Relative Index: INVALID (ref 0.0–2.5)
Relative Index: INVALID (ref 0.0–2.5)
Total CK: 54 U/L (ref 7–177)
Total CK: 61 U/L (ref 7–177)

## 2012-01-07 LAB — DIFFERENTIAL
Basophils Absolute: 0 10*3/uL (ref 0.0–0.1)
Lymphocytes Relative: 17 % (ref 12–46)
Neutro Abs: 4.2 10*3/uL (ref 1.7–7.7)

## 2012-01-07 LAB — POCT I-STAT TROPONIN I: Troponin i, poc: 0 ng/mL (ref 0.00–0.08)

## 2012-01-07 LAB — CBC
HCT: 33.8 % — ABNORMAL LOW (ref 36.0–46.0)
Platelets: 148 10*3/uL — ABNORMAL LOW (ref 150–400)
RDW: 14.6 % (ref 11.5–15.5)
WBC: 5.7 10*3/uL (ref 4.0–10.5)

## 2012-01-07 LAB — BASIC METABOLIC PANEL
CO2: 29 mEq/L (ref 19–32)
Chloride: 104 mEq/L (ref 96–112)
Potassium: 4.2 mEq/L (ref 3.5–5.1)
Sodium: 140 mEq/L (ref 135–145)

## 2012-01-07 LAB — PROTIME-INR
INR: 2.73 — ABNORMAL HIGH (ref 0.00–1.49)
Prothrombin Time: 29.4 seconds — ABNORMAL HIGH (ref 11.6–15.2)

## 2012-01-07 MED ORDER — ZOLPIDEM TARTRATE 5 MG PO TABS
5.0000 mg | ORAL_TABLET | Freq: Every evening | ORAL | Status: DC | PRN
  Administered 2012-01-07: 5 mg via ORAL
  Filled 2012-01-07: qty 1

## 2012-01-07 MED ORDER — TRAZODONE HCL 100 MG PO TABS
100.0000 mg | ORAL_TABLET | Freq: Two times a day (BID) | ORAL | Status: DC
  Administered 2012-01-07 – 2012-01-08 (×3): 100 mg via ORAL
  Filled 2012-01-07 (×5): qty 1

## 2012-01-07 MED ORDER — WARFARIN SODIUM 4 MG PO TABS
4.0000 mg | ORAL_TABLET | ORAL | Status: DC
  Filled 2012-01-07: qty 1

## 2012-01-07 MED ORDER — ALBUTEROL SULFATE HFA 108 (90 BASE) MCG/ACT IN AERS: 2.0000 | INHALATION_SPRAY | Freq: Four times a day (QID) | RESPIRATORY_TRACT | Status: DC | PRN

## 2012-01-07 MED ORDER — SALINE SPRAY 0.65 % NA SOLN
2.0000 | NASAL | Status: DC
  Administered 2012-01-07 – 2012-01-08 (×7): 2 via NASAL
  Filled 2012-01-07: qty 44

## 2012-01-07 MED ORDER — SODIUM CHLORIDE 0.9 % IJ SOLN: 3.0000 mL | Freq: Two times a day (BID) | INTRAMUSCULAR | Status: DC

## 2012-01-07 MED ORDER — WARFARIN SODIUM 6 MG PO TABS
6.0000 mg | ORAL_TABLET | ORAL | Status: DC
  Administered 2012-01-07: 6 mg via ORAL
  Filled 2012-01-07: qty 1

## 2012-01-07 MED ORDER — MELATONIN 1 MG PO TABS: 2.0000 mg | ORAL_TABLET | Freq: Every day | ORAL | Status: DC

## 2012-01-07 MED ORDER — ACETAMINOPHEN 325 MG PO TABS
650.0000 mg | ORAL_TABLET | Freq: Four times a day (QID) | ORAL | Status: DC | PRN
  Administered 2012-01-07: 650 mg via ORAL
  Filled 2012-01-07: qty 2

## 2012-01-07 MED ORDER — PANTOPRAZOLE SODIUM 40 MG PO TBEC
40.0000 mg | DELAYED_RELEASE_TABLET | Freq: Every day | ORAL | Status: DC
  Administered 2012-01-07 – 2012-01-08 (×2): 40 mg via ORAL
  Filled 2012-01-07: qty 1

## 2012-01-07 MED ORDER — SODIUM CHLORIDE 0.9 % IV SOLN
INTRAVENOUS | Status: DC
  Administered 2012-01-07: 05:00:00 via INTRAVENOUS

## 2012-01-07 MED ORDER — FERROUS SULFATE 325 (65 FE) MG PO TABS
325.0000 mg | ORAL_TABLET | Freq: Every day | ORAL | Status: DC
  Administered 2012-01-07 – 2012-01-08 (×2): 325 mg via ORAL
  Filled 2012-01-07 (×3): qty 1

## 2012-01-07 MED ORDER — ASPIRIN EC 81 MG PO TBEC
81.0000 mg | DELAYED_RELEASE_TABLET | Freq: Every day | ORAL | Status: DC
  Administered 2012-01-08: 81 mg via ORAL
  Filled 2012-01-07: qty 1

## 2012-01-07 MED ORDER — CHOLECALCIFEROL 10 MCG (400 UNIT) PO TABS
400.0000 [IU] | ORAL_TABLET | Freq: Every day | ORAL | Status: DC
  Administered 2012-01-07 – 2012-01-08 (×2): 400 [IU] via ORAL
  Filled 2012-01-07 (×2): qty 1

## 2012-01-07 MED ORDER — PNEUMOCOCCAL VAC POLYVALENT 25 MCG/0.5ML IJ INJ
0.5000 mL | INJECTION | INTRAMUSCULAR | Status: AC
  Administered 2012-01-08: 0.5 mL via INTRAMUSCULAR
  Filled 2012-01-07: qty 0.5

## 2012-01-07 MED ORDER — CALCIUM CITRATE 950 (200 CA) MG PO TABS
200.0000 mg | ORAL_TABLET | Freq: Every day | ORAL | Status: DC
  Administered 2012-01-07 – 2012-01-08 (×2): 200 mg via ORAL
  Filled 2012-01-07 (×2): qty 1

## 2012-01-07 MED ORDER — FLUTICASONE PROPIONATE 50 MCG/ACT NA SUSP
2.0000 | Freq: Every day | NASAL | Status: DC
  Administered 2012-01-07 – 2012-01-08 (×2): 2 via NASAL
  Filled 2012-01-07: qty 16

## 2012-01-07 MED ORDER — ONDANSETRON HCL 4 MG PO TABS: 4.0000 mg | ORAL_TABLET | Freq: Four times a day (QID) | ORAL | Status: DC | PRN

## 2012-01-07 MED ORDER — ISOSORBIDE MONONITRATE ER 30 MG PO TB24
30.0000 mg | ORAL_TABLET | Freq: Every day | ORAL | Status: DC
  Administered 2012-01-07 – 2012-01-08 (×2): 30 mg via ORAL
  Filled 2012-01-07 (×3): qty 1

## 2012-01-07 MED ORDER — LORATADINE 10 MG PO TABS
10.0000 mg | ORAL_TABLET | Freq: Every day | ORAL | Status: DC
  Administered 2012-01-07 – 2012-01-08 (×2): 10 mg via ORAL
  Filled 2012-01-07 (×2): qty 1

## 2012-01-07 MED ORDER — ONDANSETRON HCL 4 MG/2ML IJ SOLN: 4.0000 mg | Freq: Four times a day (QID) | INTRAMUSCULAR | Status: DC | PRN

## 2012-01-07 MED ORDER — SODIUM CHLORIDE 0.9 % IJ SOLN
3.0000 mL | Freq: Two times a day (BID) | INTRAMUSCULAR | Status: DC
  Administered 2012-01-07 – 2012-01-08 (×2): 3 mL via INTRAVENOUS

## 2012-01-07 MED ORDER — CALCIUM CITRATE-VITAMIN D 200-200 MG-UNIT PO TABS
1.0000 | ORAL_TABLET | Freq: Every day | ORAL | Status: DC
Start: 2012-01-07 — End: 2012-01-07

## 2012-01-07 MED ORDER — ASPIRIN 81 MG PO CHEW
324.0000 mg | CHEWABLE_TABLET | Freq: Once | ORAL | Status: AC
  Administered 2012-01-07: 324 mg via ORAL
  Filled 2012-01-07: qty 4

## 2012-01-07 MED ORDER — SERTRALINE HCL 100 MG PO TABS
100.0000 mg | ORAL_TABLET | Freq: Every day | ORAL | Status: DC
  Administered 2012-01-07: 100 mg via ORAL
  Filled 2012-01-07 (×2): qty 1

## 2012-01-07 MED ORDER — MUPIROCIN 2 % EX OINT
1.0000 "application " | TOPICAL_OINTMENT | Freq: Three times a day (TID) | CUTANEOUS | Status: DC
  Administered 2012-01-07 – 2012-01-08 (×2): 1 via TOPICAL
  Filled 2012-01-07: qty 22

## 2012-01-07 MED ORDER — ACETAMINOPHEN 650 MG RE SUPP: 650.0000 mg | Freq: Four times a day (QID) | RECTAL | Status: DC | PRN

## 2012-01-07 MED ORDER — CLONAZEPAM 0.5 MG PO TABS
0.5000 mg | ORAL_TABLET | Freq: Three times a day (TID) | ORAL | Status: DC
  Administered 2012-01-07 – 2012-01-08 (×4): 0.5 mg via ORAL
  Filled 2012-01-07 (×4): qty 1

## 2012-01-07 MED ORDER — NITROGLYCERIN 0.4 MG SL SUBL: 0.4000 mg | SUBLINGUAL_TABLET | SUBLINGUAL | Status: DC | PRN

## 2012-01-07 MED ORDER — SIMVASTATIN 20 MG PO TABS
20.0000 mg | ORAL_TABLET | Freq: Every day | ORAL | Status: DC
  Administered 2012-01-07: 20 mg via ORAL
  Filled 2012-01-07 (×2): qty 1

## 2012-01-07 MED ORDER — LOSARTAN POTASSIUM 50 MG PO TABS
50.0000 mg | ORAL_TABLET | Freq: Every day | ORAL | Status: DC
  Administered 2012-01-07 – 2012-01-08 (×2): 50 mg via ORAL
  Filled 2012-01-07 (×2): qty 1

## 2012-01-07 NOTE — ED Notes (Signed)
Tried to call report and they will call me back.

## 2012-01-07 NOTE — Progress Notes (Signed)
Subjective: Denies any chest pain or shortness of breath.  Objective: Vital signs in last 24 hours: Filed Vitals:   01/07/12 0403 01/07/12 0515 01/07/12 0640 01/07/12 0900  BP: 183/81 166/78 106/76 168/64  Pulse: 65 57 55   Temp:   98.1 F (36.7 C)   TempSrc: Oral  Oral   Resp: 24 17 18    Height: 5\' 1"  (1.549 m)  5\' 1"  (1.549 m)   Weight: 93.441 kg (206 lb)  92.625 kg (204 lb 3.2 oz)   SpO2: 97% 94% 96%    Weight change:   Intake/Output Summary (Last 24 hours) at 01/07/12 1323 Last data filed at 01/07/12 0900  Gross per 24 hour  Intake    480 ml  Output      0 ml  Net    480 ml    Physical Exam: General: Awake, Oriented, No acute distress. HEENT: EOMI. Neck: Supple CV: S1 and S2 Lungs: Clear to ascultation bilaterally Abdomen: Soft, Nontender, Nondistended, +bowel sounds. Ext: Good pulses. Trace edema.  Lab Results:  [facility] 01/07/12 0424  NA 140  K 4.2  CL 104  CO2 29  GLUCOSE 110*  BUN 21  CREATININE 1.06  CALCIUM 9.8  MG --  PHOS --   No results found for this basename: AST:2,ALT:2,ALKPHOS:2,BILITOT:2,PROT:2,ALBUMIN:2 in the last 72 hours No results found for this basename: LIPASE:2,AMYLASE:2 in the last 72 hours  Basename 01/07/12 0424  WBC 5.7  NEUTROABS 4.2  HGB 10.7*  HCT 33.8*  MCV 84.3  PLT 148*    Basename 01/07/12 0655  CKTOTAL 59  CKMB 3.0  CKMBINDEX --  TROPONINI <0.30   No components found with this basename: POCBNP:3 No results found for this basename: DDIMER:2 in the last 72 hours No results found for this basename: HGBA1C:2 in the last 72 hours No results found for this basename: CHOL:2,HDL:2,LDLCALC:2,TRIG:2,CHOLHDL:2,LDLDIRECT:2 in the last 72 hours No results found for this basename: TSH,T4TOTAL,FREET3,T3FREE,THYROIDAB in the last 72 hours No results found for this basename: VITAMINB12:2,FOLATE:2,FERRITIN:2,TIBC:2,IRON:2,RETICCTPCT:2 in the last 72 hours  Micro Results: Recent Results (from the past 240 hour(s))  MRSA  PCR SCREENING     Status: Normal   Collection Time   01/07/12  7:05 AM      Component Value Range Status Comment   MRSA by PCR NEGATIVE  NEGATIVE  Final     Studies/Results: Dg Chest Port 1 View  01/07/2012  *RADIOLOGY REPORT*  Clinical Data: Chest pain  PORTABLE CHEST - 1 VIEW  Comparison: 07/31/2006  Findings: Cardiomegaly central vascular congestion. Loss of the normal AP window concavity. Otherwise, no overt edema or new areas of consolidation.  No effusion or pneumothorax.  No acute osseous abnormality.  IMPRESSION: Cardiomegaly. Loss of the AP window concavity may be exaggerated by technique/positioning. Recommend PA and lateral radiographs to better characterize.  Original Report Authenticated By: Waneta Martins, M.D.    Medications: I have reviewed the patient's current medications. Scheduled Meds:   . aspirin  324 mg Oral Once  . aspirin EC  81 mg Oral Daily  . calcium citrate  200 mg of elemental calcium Oral Daily  . cholecalciferol  400 Units Oral Daily  . clonazePAM  0.5 mg Oral TID  . ferrous sulfate  325 mg Oral Q breakfast  . fluticasone  2 spray Each Nare Daily  . isosorbide mononitrate  30 mg Oral Daily  . loratadine  10 mg Oral Daily  . losartan  50 mg Oral Daily  . mupirocin ointment  1 application Topical TID  . pantoprazole  40 mg Oral Q1200  . pneumococcal 23 valent vaccine  0.5 mL Intramuscular Tomorrow-1000  . sertraline  100 mg Oral QHS  . simvastatin  20 mg Oral q1800  . sodium chloride  2 spray Nasal Q4H  . sodium chloride  3 mL Intravenous Q12H  . traZODone  100 mg Oral BID  . warfarin  4 mg Oral Q T,Th,S,Su-1800  . warfarin  6 mg Oral Q M,W,F-1800  . DISCONTD: calcium citrate-vitamin D  1 tablet Oral Daily  . DISCONTD: Melatonin  2 mg Oral QHS  . DISCONTD: sodium chloride  3 mL Intravenous Q12H   Continuous Infusions:   . DISCONTD: sodium chloride 125 mL/hr at 01/07/12 0511   PRN Meds:.acetaminophen, acetaminophen, albuterol, nitroGLYCERIN,  ondansetron (ZOFRAN) IV, ondansetron, zolpidem  Assessment/Plan: 1. Chest pain.  Currently on telemetry.  Trending troponins, ruling the patient out for acute coronary syndrome.  Continue aspirin.  Patient was started on isosorbide mononitrate by cardiology 30 mg daily.  2.  History of A. fib.  Patient rate controlled continue Coumadin per pharmacy.  3.  Hypertension.  Stable continue to monitor.  If blood pressure is elevated consistently will adjust antihypertensive medications.  Patient started on isosorbide mononitrate  4.  Anemia.  Due to iron deficiency anemia.  Continue for ferrous sulfate.  5. Asthma.  Stable.  6.  Hyperlipidemia.  Continue statin.  7. CVA (cerebral infarction).  Continue aspirin.  8.  Prophylaxis.  Therapeutic INR.  9.  Disposition.  Pending.  Patient from assisted living facility.  As per cardiology if no further workup after being ruled out for acute coronary syndrome consider discharge 1-2 days.   LOS: 0 days  Avan Gullett A, MD 01/07/2012, 1:23 PM

## 2012-01-07 NOTE — Progress Notes (Signed)
.  Clinical social worker completed patient psychosocial assessment, please see assessment in patient shadow chart.  Pt plans to return to [facility] when medically stable. Pt will be able to return without nurse assessment per discussion with alf. CSW completed fl2 for md signature. .Clinical social worker continuing to follow pt to assist with pt dc plans and further csw needs.   Catha Gosselin, [place]  [phone] .01/07/2012 16:47pm

## 2012-01-07 NOTE — Progress Notes (Signed)
ANTICOAGULATION CONSULT NOTE - Initial Consult  Pharmacy Consult for Coumadin Indication: atrial fibrillation  No Known Allergies  Patient Measurements: Height: 5\' 1"  (154.9 cm) Weight: 206 lb (93.441 kg) IBW/kg (Calculated) : 47.8   Vital Signs: Temp src: Oral (02/08 0403) BP: 166/78 mmHg (02/08 0515) Pulse Rate: 57  (02/08 0515)  Labs:  Basename 01/07/12 0424  HGB 10.7*  HCT 33.8*  PLT 148*  APTT --  LABPROT 29.4*  INR 2.73*  HEPARINUNFRC --  CREATININE 1.06  CKTOTAL --  CKMB --  TROPONINI --   Estimated Creatinine Clearance: 50 ml/min (by C-G formula based on Cr of 1.06).  Medical History: Past Medical History  Diagnosis Date  . Stroke     2007  . Asthma   . Bronchitis   . Hypertension   . Recurrent upper respiratory infection (URI)   . GERD (gastroesophageal reflux disease)   . Depression   . Anxiety   . Dysrhythmia     Medications:  Coumadin 4 mg TTSS 6 mg MWF APAP  Albuterol  Fosamax  ASA  Oscal D  Zyrtec  Klonopin  Iron  Flonase  Cozaar  Melatonin  Bactroban  Ntg  Prilosec  Zoloft  Zocor  Trazodone  Ambien  Assessment: 71 yo female admitted with chest pain, h/o Afib, for continues anticoagulation  Goal of Therapy:  INR 2-3   Plan:  Continue prior Coumadin regimen.  CONCERNING: P&T Medication Policy on Herbal Medications  DESCRIPTION:  This patient's order for:  Melatonin  has been noted. This product(s) is classified as an "herbal" or natural product. Due to a lack of definitive safety studies or FDA approval, nonstandard manufacturing practices, plus the potential risk of unknown drug-drug interactions while on inpatient medications, the Pharmacy and Therapeutics Committee does not permit the use of "herbal" or natural products of this type within [facility].   RECOMMENDATION: The pharmacy department has rejected this order at this time and your patient has been informed of this safety policy. Please reevaluate patient's clinical  condition at discharge and address if the herbal or natural product(s) should be resumed at that time.   Eddie Candle 01/07/2012,6:32 AM

## 2012-01-07 NOTE — Consult Note (Signed)
WOC consult Note Reason for Consult: req to eval for wound care for abdominal wound.  Pt reports she has had open area for several weeks, they come up and heal.  Today wound on abdomen is healed completely and scarred. No need for dressing at this time.  Re consult if needed, will not follow at this time. Thanks  Nabeeha Badertscher [employer], CWOCN [phone])

## 2012-01-07 NOTE — H&P (Addendum)
Cindy [name] is an 71 y.o. female.   PCP - Dr.[facility] - [facility] - Dr.Schooler. Chief Complaint: Chest pain. HPI: 71 year-old female with history as previous CVA during left-sided carotid endarterectomy, hypertension, atrial fibrillation on Coumadin, asthma who lives in an assisted living facility presented to the for chest pain. Patient states the chest pain happened when she went to bed. It was retrosternal and nonradiating with no associated shortness of breath palpitation diaphoresis nausea vomiting or abdominal pain. At the assisted living facility patient was given nitroglycerin after which her chest pain subsided. Patient was brought to the ER for further workup. At this time her cardiac enzymes remain negative chest x-ray and EKG does not show any acute.  Past Medical History  Diagnosis Date  . Stroke     2007  . Asthma   . Bronchitis   . Hypertension   . Recurrent upper respiratory infection (URI)   . GERD (gastroesophageal reflux disease)   . Depression   . Anxiety   . Dysrhythmia     Past Surgical History  Procedure Date  . Carotid artery angioplasty   . Abdominal hysterectomy   . Cholecystectomy   . Partial colectomy   . Esophagogastroduodenoscopy 12/01/2011    Procedure: ESOPHAGOGASTRODUODENOSCOPY (EGD);  Surgeon: Micheala Friar, MD;  Location: [facility];  Service: Endoscopy;  Laterality: N/A;  . Colonoscopy 12/01/2011    Procedure: COLONOSCOPY;  Surgeon: Vonette Friar, MD;  Location: [facility];  Service: Endoscopy;  Laterality: N/A;  . Hot hemostasis 12/01/2011    Procedure: HOT HEMOSTASIS (ARGON PLASMA COAGULATION/BICAP);  Surgeon: Jacklin Friar, MD;  Location: [facility];  Service: Endoscopy;  Laterality: N/A;    History reviewed. No pertinent family history. Social History:  reports that she has quit smoking. She does not have any smokeless tobacco history on file. She reports that she does not drink alcohol or use illicit  drugs.  Allergies: No Known Allergies  Medications Prior to Admission  Medication Dose Route Frequency Provider Last Rate Last Dose  . 0.9 %  sodium chloride infusion   Intravenous Continuous Nicholes Stairs, MD 125 mL/hr at 01/07/12 0511    . aspirin chewable tablet 324 mg  324 mg Oral Once Nicholes Stairs, MD   324 mg at 01/07/12 5621   Medications Prior to Admission  Medication Sig Dispense Refill  . acetaminophen (TYLENOL) 325 MG tablet Take 650 mg by mouth every evening. 8pm      . albuterol (PROVENTIL HFA;VENTOLIN HFA) 108 (90 BASE) MCG/ACT inhaler Inhale 2 puffs into the lungs every 6 (six) hours as needed. For shortness of breath      . alendronate (FOSAMAX) 70 MG tablet Take 70 mg by mouth every 7 (seven) days. Take with a full glass of water on an empty stomach.       Marland Kitchen aspirin 81 MG chewable tablet Chew 81 mg by mouth daily.        . calcium citrate-vitamin D 200-200 MG-UNIT TABS Take 1 tablet by mouth daily.        . cetirizine (ZYRTEC) 10 MG tablet Take 10 mg by mouth daily.        . clonazePAM (KLONOPIN) 0.5 MG tablet Take 0.5 mg by mouth 3 (three) times daily.        . ferrous sulfate 325 (65 FE) MG tablet Take 325 mg by mouth daily with breakfast.        . fluticasone (FLONASE) 50 MCG/ACT nasal spray  Place 2 sprays into the nose daily.        Marland Kitchen losartan (COZAAR) 50 MG tablet Take 50 mg by mouth daily.        . Melatonin 1 MG TABS Take 2 mg by mouth at bedtime.       Marland Kitchen omeprazole (PRILOSEC) 20 MG capsule Take 40 mg by mouth daily.        . sertraline (ZOLOFT) 100 MG tablet Take 100 mg by mouth at bedtime.       . simvastatin (ZOCOR) 20 MG tablet Take 20 mg by mouth at bedtime.        . traZODone (DESYREL) 100 MG tablet Take 100 mg by mouth 2 (two) times daily.          Results for orders placed during the hospital encounter of 01/07/12 (from the past 48 hour(s))  CBC     Status: Abnormal   Collection Time   01/07/12  4:24 AM      Component Value Range Comment    WBC 5.7  4.0 - 10.5 (K/uL)    RBC 4.01  3.87 - 5.11 (MIL/uL)    Hemoglobin 10.7 (*) 12.0 - 15.0 (g/dL)    HCT 54.0 (*) 98.1 - 46.0 (%)    MCV 84.3  78.0 - 100.0 (fL)    MCH 26.7  26.0 - 34.0 (pg)    MCHC 31.7  30.0 - 36.0 (g/dL)    RDW 19.1  47.8 - 29.5 (%)    Platelets 148 (*) 150 - 400 (K/uL)   DIFFERENTIAL     Status: Normal   Collection Time   01/07/12  4:24 AM      Component Value Range Comment   Neutrophils Relative 74  43 - 77 (%)    Neutro Abs 4.2  1.7 - 7.7 (K/uL)    Lymphocytes Relative 17  12 - 46 (%)    Lymphs Abs 0.9  0.7 - 4.0 (K/uL)    Monocytes Relative 6  3 - 12 (%)    Monocytes Absolute 0.4  0.1 - 1.0 (K/uL)    Eosinophils Relative 3  0 - 5 (%)    Eosinophils Absolute 0.2  0.0 - 0.7 (K/uL)    Basophils Relative 0  0 - 1 (%)    Basophils Absolute 0.0  0.0 - 0.1 (K/uL)   BASIC METABOLIC PANEL     Status: Abnormal   Collection Time   01/07/12  4:24 AM      Component Value Range Comment   Sodium 140  135 - 145 (mEq/L)    Potassium 4.2  3.5 - 5.1 (mEq/L)    Chloride 104  96 - 112 (mEq/L)    CO2 29  19 - 32 (mEq/L)    Glucose, Bld 110 (*) 70 - 99 (mg/dL)    BUN 21  6 - 23 (mg/dL)    Creatinine, Ser 6.21  0.50 - 1.10 (mg/dL)    Calcium 9.8  8.4 - 10.5 (mg/dL)    GFR calc non Af Amer 51 (*) >90 (mL/min)    GFR calc Af Amer 59 (*) >90 (mL/min)   PROTIME-INR     Status: Abnormal   Collection Time   01/07/12  4:24 AM      Component Value Range Comment   Prothrombin Time 29.4 (*) 11.6 - 15.2 (seconds)    INR 2.73 (*) 0.00 - 1.49    POCT I-STAT TROPONIN I     Status: Normal   Collection Time  01/07/12  4:37 AM      Component Value Range Comment   Troponin i, poc 0.00  0.00 - 0.08 (ng/mL)    Comment 3             Dg Chest Port 1 View  01/07/2012  *RADIOLOGY REPORT*  Clinical Data: Chest pain  PORTABLE CHEST - 1 VIEW  Comparison: 07/31/2006  Findings: Cardiomegaly central vascular congestion. Loss of the normal AP window concavity. Otherwise, no overt edema or new areas  of consolidation.  No effusion or pneumothorax.  No acute osseous abnormality.  IMPRESSION: Cardiomegaly. Loss of the AP window concavity may be exaggerated by technique/positioning. Recommend PA and lateral radiographs to better characterize.  Original Report Authenticated By: Waneta Martins, M.D.    Review of Systems  Constitutional: Negative.   HENT: Negative.   Eyes: Negative.   Respiratory: Negative.   Cardiovascular: Positive for chest pain.  Gastrointestinal: Negative.   Genitourinary: Negative.   Musculoskeletal: Negative.   Skin: Negative.   Neurological: Negative.   Endo/Heme/Allergies: Negative.   Psychiatric/Behavioral: Negative.     Blood pressure 166/78, pulse 57, resp. rate 17, height 5\' 1"  (1.549 m), weight 93.441 kg (206 lb), SpO2 94.00%. Physical Exam  Constitutional: She is oriented to person, place, and time. She appears well-developed and well-nourished. No distress.  HENT:  Head: Normocephalic and atraumatic.  Right Ear: External ear normal.  Left Ear: External ear normal.  Nose: Nose normal.  Mouth/Throat: Oropharynx is clear and moist. No oropharyngeal exudate.  Eyes: Conjunctivae are normal. Pupils are equal, round, and reactive to light. Right eye exhibits no discharge. Left eye exhibits no discharge. No scleral icterus.  Neck: Normal range of motion. Neck supple.  Cardiovascular: Normal rate.        Irregular.  Respiratory: Effort normal and breath sounds normal. No respiratory distress. She has no wheezes. She has no rales.  GI: Soft. Bowel sounds are normal. She exhibits no distension. There is no tenderness. There is no rebound.       Small wound in the abdomen.  Musculoskeletal: Normal range of motion. She exhibits no edema and no tenderness.  Neurological: She is alert and oriented to person, place, and time.       Moves all limbs 5/5.  Skin: She is not diaphoretic.  Psychiatric: Her behavior is normal.     Assessment/Plan #1. Chest pain  to rule out ACS - presently patient is chest pain-free. We will cycle cardiac markers. #2. History of atrial fibrillation on Coumadin rate controlled - continue Coumadin per pharmacy and present medications. #3. Small skin abrasion on the abdomen - continue mupirocin cream and also consulted wound team. #4. History of hypertension and hyperlipidemia - continue present medications. #5. Anemia - out patient work up. Follow CBC.  CODE STATUS - full code.  Daelyn Mozer N. 01/07/2012, 6:06 AM

## 2012-01-07 NOTE — ED Notes (Signed)
Pt is in from assisted living. Pt had chest pains last night. Was given a nitro at 2300 and at 0300. Pt did not want to come but daughter "made" her come. Pt has been pain free with EMS.

## 2012-01-07 NOTE — ED Provider Notes (Signed)
History     CSN: [id number]  Arrival date & time 01/07/12  0410   First MD Initiated Contact with Patient 01/07/12 0411      Chief Complaint  Patient presents with  . Chest Pain    (Consider location/radiation/quality/duration/timing/severity/associated sxs/prior treatment) Patient is a 71 y.o. female presenting with chest pain. The history is provided by the patient, medical records and the nursing home.  Chest Pain Primary symptoms include shortness of breath. Pertinent negatives for primary symptoms include no fever, no cough, no nausea and no vomiting.    the patient is a 71 year old, female, with a history of stroke, and hypertension, who presents to the emergency department after she had 2 episodes of chest pain.  She described it as if a breakfast sitting on her chest.  She was given nitroglycerin at the nursing home and her symptoms resolved.  She denies nausea, vomiting, fevers, chills.  She did have shortness of breath.  She denies leg pain or swelling.  She denies history of prior heart attack in the past.  She denies a history of congestive heart failure.  She is asymptomatic now.  Past Medical History  Diagnosis Date  . Stroke     2007  . Asthma   . Bronchitis   . Hypertension   . Recurrent upper respiratory infection (URI)   . GERD (gastroesophageal reflux disease)   . Depression   . Anxiety     Past Surgical History  Procedure Date  . Carotid artery angioplasty   . Abdominal hysterectomy   . Cholecystectomy   . Partial colectomy   . Esophagogastroduodenoscopy 12/01/2011    Procedure: ESOPHAGOGASTRODUODENOSCOPY (EGD);  Surgeon: Novaleigh Friar, MD;  Location: [facility];  Service: Endoscopy;  Laterality: N/A;  . Colonoscopy 12/01/2011    Procedure: COLONOSCOPY;  Surgeon: Diania Friar, MD;  Location: [facility];  Service: Endoscopy;  Laterality: N/A;  . Hot hemostasis 12/01/2011    Procedure: HOT HEMOSTASIS (ARGON PLASMA COAGULATION/BICAP);  Surgeon:  Tyrihanna Friar, MD;  Location: [facility];  Service: Endoscopy;  Laterality: N/A;    History reviewed. No pertinent family history.  History  Substance Use Topics  . Smoking status: Former Games developer  . Smokeless tobacco: Not on file  . Alcohol Use: No    OB History    Grav Para Term Preterm Abortions TAB SAB Ect Mult Living                  Review of Systems  Constitutional: Negative for fever and chills.  HENT: Negative for congestion.   Respiratory: Positive for shortness of breath. Negative for cough.   Cardiovascular: Positive for chest pain.  Gastrointestinal: Negative for nausea and vomiting.  Skin: Negative for rash.  Neurological: Negative for headaches.  Psychiatric/Behavioral: Negative for confusion.  All other systems reviewed and are negative.    Allergies  Review of patient's allergies indicates no known allergies.  Home Medications   Current Outpatient Rx  Name Route Sig Dispense Refill  . ACETAMINOPHEN 325 MG PO TABS Oral Take 650 mg by mouth Nightly.      . ALBUTEROL SULFATE HFA 108 (90 BASE) MCG/ACT IN AERS Inhalation Inhale 2 puffs into the lungs every 6 (six) hours as needed.      . ALENDRONATE SODIUM 70 MG PO TABS Oral Take 70 mg by mouth every 7 (seven) days. Take with a full glass of water on an empty stomach.     . ASPIRIN 81 MG PO CHEW Oral  Chew 81 mg by mouth daily.      Marland Kitchen BISACODYL 5 MG PO TBEC Oral Take 5 mg by mouth daily as needed.      Marland Kitchen CALCIUM CITRATE-VITAMIN D 200-200 MG-UNIT PO TABS Oral Take 1 tablet by mouth daily.      Marland Kitchen CETIRIZINE HCL 10 MG PO TABS Oral Take 10 mg by mouth daily.      Marland Kitchen CLONAZEPAM 0.5 MG PO TABS Oral Take 0.5 mg by mouth 3 (three) times daily.      Marland Kitchen FERROUS SULFATE 325 (65 FE) MG PO TABS Oral Take 325 mg by mouth daily with breakfast.      . FLUTICASONE PROPIONATE 50 MCG/ACT NA SUSP Nasal Place 2 sprays into the nose daily.      Marland Kitchen LOSARTAN POTASSIUM 50 MG PO TABS Oral Take 50 mg by mouth daily.      Marland Kitchen  MELATONIN 1 MG PO TABS Oral Take 1 mg by mouth Nightly.      Marland Kitchen OMEPRAZOLE 20 MG PO CPDR Oral Take 40 mg by mouth daily.      . SERTRALINE HCL 100 MG PO TABS Oral Take 100 mg by mouth daily.      Marland Kitchen SIMVASTATIN 20 MG PO TABS Oral Take 20 mg by mouth at bedtime.      . TRAZODONE HCL 100 MG PO TABS Oral Take 100 mg by mouth 2 (two) times daily.        BP 183/81  Pulse 65  Resp 24  Ht 5\' 1"  (1.549 m)  Wt 206 lb (93.441 kg)  BMI 38.92 kg/m2  SpO2 97%  Physical Exam  Constitutional: She is oriented to person, place, and time. No distress.       Obese  HENT:  Head: Normocephalic and atraumatic.  Eyes: Conjunctivae are normal. Pupils are equal, round, and reactive to light.  Neck: Normal range of motion. Neck supple.  Cardiovascular: Regular rhythm.   No murmur heard.      Bradycardia  Pulmonary/Chest: Effort normal and breath sounds normal. No respiratory distress.  Abdominal: Soft. There is no tenderness.  Musculoskeletal: Normal range of motion. She exhibits no edema and no tenderness.  Neurological: She is alert and oriented to person, place, and time.  Skin: Skin is warm and dry.  Psychiatric: She has a normal mood and affect.    ED Course  Procedures (including critical care time) 72 year old, female, with known history of coronary artery disease, presents to the emergency department after she had 2 episodes of pressure in her chest with shortness of breath.  Her symptoms resolved, with nitroglycerin.  She is asymptomatic now.  Her physical examination is normal.  EKG does not show any signs of acute coronary syndrome.  We will perform a chest x-ray, and laboratory testing, and monitor.  ED ECG REPORT   Date: 01/07/2012  EKG Time: 4:28 AM  Rate: 56   Rhythm: sinus bradycardia,    Axis:  nl  Intervals:first-degree A-V block   ST&T Change: none  Narrative Interpretation: sinus brady with 1st degree av block            Labs Reviewed  CBC  DIFFERENTIAL  BASIC METABOLIC  PANEL   No results found.   No diagnosis found.  5:18 AM Spoke with [facility]. He accepted pt for admission.  MDM  Chest pain New onset cp c/w angina.  Therefore, unstable angina.  No stemi and neg ces. Pain free.  Nicholes Stairs, MD 01/07/12 [phone]

## 2012-01-07 NOTE — Consult Note (Signed)
Admit date: 01/07/2012 Referring Physician  Dr. Betti Cruz Primary Physician  Dr. Redmond School Primary Cardiologist  Dr. Eldridge Dace Reason for Consultation  Chest pain  HPI: This is a 71yo WF with a history of atrial fibrillation, CVA with carotid artery stenosis, HTN and GERD who presented to the hospital with complaints of chest pain.  She says that she had gone to bed and was asleep when she was awakened by chest pain.  It was midsternal in location and described as a pressure.  There was no radiation of the discomfort.  She did have some diaphoresis but no nausea or vomiting.  She was given 2  NTG at the assisted living and by the time she got to the ER it was gone.  She has not had any chest pain since admission.  SHe has never had chest pain up until yesterday.     PMH:   Past Medical History  Diagnosis Date  . Stroke     2007  . Asthma   . Bronchitis   . Hypertension   . Recurrent upper respiratory infection (URI)   . GERD (gastroesophageal reflux disease)   . Depression   . Anxiety   . Dysrhythmia     atrial fibrillation     PSH:   Past Surgical History  Procedure Date  . Carotid artery angioplasty   . Abdominal hysterectomy   . Cholecystectomy   . Partial colectomy   . Esophagogastroduodenoscopy 12/01/2011    Procedure: ESOPHAGOGASTRODUODENOSCOPY (EGD);  Surgeon: Mirakle Friar, MD;  Location: [facility];  Service: Endoscopy;  Laterality: N/A;  . Colonoscopy 12/01/2011    Procedure: COLONOSCOPY;  Surgeon: Journii Friar, MD;  Location: [facility];  Service: Endoscopy;  Laterality: N/A;  . Hot hemostasis 12/01/2011    Procedure: HOT HEMOSTASIS (ARGON PLASMA COAGULATION/BICAP);  Surgeon: Elna Friar, MD;  Location: [facility];  Service: Endoscopy;  Laterality: N/A;    Allergies:  Review of patient's allergies indicates no known allergies. Prior to Admit Meds:   Prescriptions prior to admission  Medication Sig Dispense Refill  . acetaminophen (TYLENOL) 325 MG tablet  Take 650 mg by mouth every evening. 8pm      . albuterol (PROVENTIL HFA;VENTOLIN HFA) 108 (90 BASE) MCG/ACT inhaler Inhale 2 puffs into the lungs every 6 (six) hours as needed. For shortness of breath      . alendronate (FOSAMAX) 70 MG tablet Take 70 mg by mouth every 7 (seven) days. Take with a full glass of water on an empty stomach.       Marland Kitchen aspirin 81 MG chewable tablet Chew 81 mg by mouth daily.        . calcium citrate-vitamin D 200-200 MG-UNIT TABS Take 1 tablet by mouth daily.        . cetirizine (ZYRTEC) 10 MG tablet Take 10 mg by mouth daily.        . clonazePAM (KLONOPIN) 0.5 MG tablet Take 0.5 mg by mouth 3 (three) times daily.        . ferrous sulfate 325 (65 FE) MG tablet Take 325 mg by mouth daily with breakfast.        . fluticasone (FLONASE) 50 MCG/ACT nasal spray Place 2 sprays into the nose daily.        Marland Kitchen guaiFENesin (ROBITUSSIN) 100 MG/5ML liquid Take 200 mg by mouth 3 (three) times daily as needed. For cough      . losartan (COZAAR) 50 MG tablet Take 50 mg by mouth daily.        Marland Kitchen  Melatonin 1 MG TABS Take 2 mg by mouth at bedtime.       . mupirocin ointment (BACTROBAN) 2 % Apply 1 application topically 3 (three) times daily. Apply to abdomen,arm, and upper chest      . nitroGLYCERIN (NITROSTAT) 0.4 MG SL tablet Place 0.4 mg under the tongue every 5 (five) minutes as needed. For chest pain      . omeprazole (PRILOSEC) 20 MG capsule Take 40 mg by mouth daily.        Marland Kitchen OVER THE COUNTER MEDICATION Place 1 drop into both ears every 7 (seven) days. Unknown day of the week not specified on mar      . sertraline (ZOLOFT) 100 MG tablet Take 100 mg by mouth at bedtime.       . simvastatin (ZOCOR) 20 MG tablet Take 20 mg by mouth at bedtime.        . sodium chloride (OCEAN) 0.65 % SOLN nasal spray Place 2 sprays into the nose every 4 (four) hours. Scheduled while awake only      . traZODone (DESYREL) 100 MG tablet Take 100 mg by mouth 2 (two) times daily.        Marland Kitchen triamcinolone cream  (KENALOG) 0.1 % Apply 1 application topically 2 (two) times daily as needed. Apply to affected area      . warfarin (COUMADIN) 4 MG tablet Take 4 mg by mouth every Tuesday, Thursday, Saturday, and Sunday.      . warfarin (COUMADIN) 6 MG tablet Take 6 mg by mouth every Monday, Wednesday, and Friday.      . zolpidem (AMBIEN) 5 MG tablet Take 5 mg by mouth at bedtime as needed. For sleep       Fam HX:   History reviewed. No pertinent family history. Social HX:    History   Social History  . Marital Status: Divorced    Spouse Name: N/A    Number of Children: N/A  . Years of Education: N/A   Occupational History  . Not on file.   Social History Main Topics  . Smoking status: Former Games developer  . Smokeless tobacco: Not on file  . Alcohol Use: No  . Drug Use: No  . Sexually Active: No   Other Topics Concern  . Not on file   Social History Narrative  . No narrative on file     ROS:  All 11 ROS were addressed and are negative except what is stated in the HPI  Physical Exam: Blood pressure 168/64, pulse 55, temperature 98.1 F (36.7 C), temperature source Oral, resp. rate 18, height 5\' 1"  (1.549 m), weight 92.625 kg (204 lb 3.2 oz), SpO2 96.00%.    General: Well developed, well nourished, in no acute distress Head: Eyes PERRLA, No xanthomas.   Normal cephalic and atramatic  Lungs:   Clear bilaterally to auscultation and percussion. Heart:   HRRR S1 S2 Pulses are 2+ & equal.            No carotid bruit. No JVD.  No abdominal bruits. No femoral bruits. Abdomen: Bowel sounds are positive, abdomen soft and non-tender without masse Extremities:   No clubbing, cyanosis or edema.  DP +1 Neuro: Alert and oriented X 3. Psych:  Good affect, responds appropriately    Labs:   Lab Results  Component Value Date   WBC 5.7 01/07/2012   HGB 10.7* 01/07/2012   HCT 33.8* 01/07/2012   MCV 84.3 01/07/2012   PLT 148* 01/07/2012  Lab 01/07/12 0424  NA 140  K 4.2  CL 104  CO2 29  BUN 21    CREATININE 1.06  CALCIUM 9.8  PROT --  BILITOT --  ALKPHOS --  ALT --  AST --  GLUCOSE 110*   No results found for this basename: PTT   Lab Results  Component Value Date   INR 2.73* 01/07/2012   INR 1.84* 10/24/2010   Lab Results  Component Value Date   CKTOTAL 59 01/07/2012   CKMB 3.0 01/07/2012   TROPONINI <0.30 01/07/2012       Radiology:  Dg Chest Port 1 View  01/07/2012  *RADIOLOGY REPORT*  Clinical Data: Chest pain  PORTABLE CHEST - 1 VIEW  Comparison: 07/31/2006  Findings: Cardiomegaly central vascular congestion. Loss of the normal AP window concavity. Otherwise, no overt edema or new areas of consolidation.  No effusion or pneumothorax.  No acute osseous abnormality.  IMPRESSION: Cardiomegaly. Loss of the AP window concavity may be exaggerated by technique/positioning. Recommend PA and lateral radiographs to better characterize.  Original Report Authenticated By: Waneta Martins, M.D.    EKG:  NSR with nonspecific ST abnormality  ASSESSMENT:  1.  Chest pain with negative cardiac enzymes x 1 and no futher chest pain.  Her cardiac risk factors include age, PVD, HTN, obesity and remote tob abuse.  Chest pain occurred at night ? Coronary ischemia vs. GERD with esophageal spasm.  She had a nuclear stress test done last April that showed a small area of ischemia and she was placed on medical therapy.   2.  PVD with carotid artery angioplasty complicated by CVA 3.  HTN 4.  Obesity 5.  H/O PAF on Coumadin  PLAN:   I have discussed patient with Dr. Eldridge Dace who is her primary Cardiologist.  He would like to try medical therapy with nitrates unless her cardiac enzymes become positive.  I will start Imdur 30mg  daily and monitor for further chest pain.  Dr. Anne Fu will see patient over the weekend.   Quintella Reichert, MD  01/07/2012  10:19 AM

## 2012-01-08 DIAGNOSIS — F39 Unspecified mood [affective] disorder: Secondary | ICD-10-CM | POA: Diagnosis present

## 2012-01-08 DIAGNOSIS — I110 Hypertensive heart disease with heart failure: Secondary | ICD-10-CM | POA: Diagnosis present

## 2012-01-08 DIAGNOSIS — K219 Gastro-esophageal reflux disease without esophagitis: Secondary | ICD-10-CM | POA: Diagnosis present

## 2012-01-08 DIAGNOSIS — I639 Cerebral infarction, unspecified: Secondary | ICD-10-CM | POA: Diagnosis present

## 2012-01-08 DIAGNOSIS — F419 Anxiety disorder, unspecified: Secondary | ICD-10-CM | POA: Diagnosis present

## 2012-01-08 LAB — CBC
HCT: 34.3 % — ABNORMAL LOW (ref 36.0–46.0)
Hemoglobin: 10.7 g/dL — ABNORMAL LOW (ref 12.0–15.0)
MCHC: 31.2 g/dL (ref 30.0–36.0)
MCV: 83.9 fL (ref 78.0–100.0)
RDW: 14.6 % (ref 11.5–15.5)
WBC: 5.9 10*3/uL (ref 4.0–10.5)

## 2012-01-08 LAB — PROTIME-INR: INR: 2.58 — ABNORMAL HIGH (ref 0.00–1.49)

## 2012-01-08 LAB — BASIC METABOLIC PANEL
BUN: 21 mg/dL (ref 6–23)
CO2: 30 mEq/L (ref 19–32)
Chloride: 105 mEq/L (ref 96–112)
Creatinine, Ser: 0.99 mg/dL (ref 0.50–1.10)
GFR calc Af Amer: 64 mL/min — ABNORMAL LOW (ref >90)
Potassium: 4 mEq/L (ref 3.5–5.1)

## 2012-01-08 MED ORDER — ISOSORBIDE MONONITRATE ER 30 MG PO TB24: 30.0000 mg | ORAL_TABLET | Freq: Every day | ORAL | Status: DC

## 2012-01-08 NOTE — Progress Notes (Signed)
Subjective: Denies any chest pain or shortness of breath.  Feeling better today.  Wants to be discharged home today.  Objective: Vital signs in last 24 hours: Filed Vitals:   01/07/12 0900 01/07/12 1400 01/07/12 2100 01/08/12 0322  BP: 168/64 141/75 163/72 165/72  Pulse:  57 66 61  Temp:  98.1 F (36.7 C) 98 F (36.7 C) 98.9 F (37.2 C)  TempSrc:  Oral Oral Oral  Resp:  18 18 18   Height:      Weight:      SpO2:  93% 93% 92%   Weight change:   Intake/Output Summary (Last 24 hours) at 01/08/12 1046 Last data filed at 01/08/12 0911  Gross per 24 hour  Intake    980 ml  Output      0 ml  Net    980 ml    Physical Exam: General: Awake, Oriented, No acute distress. HEENT: EOMI. Neck: Supple CV: S1 and S2 Lungs: Clear to ascultation bilaterally Abdomen: Soft, Nontender, Nondistended, +bowel sounds. Ext: Good pulses. Trace edema.  Lab Results:  Basename 01/08/12 0500 01/07/12 0424  NA 140 140  K 4.0 4.2  CL 105 104  CO2 30 29  GLUCOSE 88 110*  BUN 21 21  CREATININE 0.99 1.06  CALCIUM 10.2 9.8  MG -- --  PHOS -- --   No results found for this basename: AST:2,ALT:2,ALKPHOS:2,BILITOT:2,PROT:2,ALBUMIN:2 in the last 72 hours No results found for this basename: LIPASE:2,AMYLASE:2 in the last 72 hours  Basename 01/08/12 0500 01/07/12 0424  WBC 5.9 5.7  NEUTROABS -- 4.2  HGB 10.7* 10.7*  HCT 34.3* 33.8*  MCV 83.9 84.3  PLT 164 148*    Basename 01/07/12 2235 01/07/12 1502 01/07/12 0655  CKTOTAL 61 54 59  CKMB 2.5 2.6 3.0  CKMBINDEX -- -- --  TROPONINI <0.30 <0.30 <0.30   No components found with this basename: POCBNP:3 No results found for this basename: DDIMER:2 in the last 72 hours No results found for this basename: HGBA1C:2 in the last 72 hours No results found for this basename: CHOL:2,HDL:2,LDLCALC:2,TRIG:2,CHOLHDL:2,LDLDIRECT:2 in the last 72 hours No results found for this basename: TSH,T4TOTAL,FREET3,T3FREE,THYROIDAB in the last 72 hours No results  found for this basename: VITAMINB12:2,FOLATE:2,FERRITIN:2,TIBC:2,IRON:2,RETICCTPCT:2 in the last 72 hours  Micro Results: Recent Results (from the past 240 hour(s))  MRSA PCR SCREENING     Status: Normal   Collection Time   01/07/12  7:05 AM      Component Value Range Status Comment   MRSA by PCR NEGATIVE  NEGATIVE  Final     Studies/Results: Dg Chest Port 1 View  01/07/2012  *RADIOLOGY REPORT*  Clinical Data: Chest pain  PORTABLE CHEST - 1 VIEW  Comparison: 07/31/2006  Findings: Cardiomegaly central vascular congestion. Loss of the normal AP window concavity. Otherwise, no overt edema or new areas of consolidation.  No effusion or pneumothorax.  No acute osseous abnormality.  IMPRESSION: Cardiomegaly. Loss of the AP window concavity may be exaggerated by technique/positioning. Recommend PA and lateral radiographs to better characterize.  Original Report Authenticated By: Waneta Martins, M.D.    Medications: I have reviewed the patient's current medications. Scheduled Meds:    . aspirin EC  81 mg Oral Daily  . calcium citrate  200 mg of elemental calcium Oral Daily  . cholecalciferol  400 Units Oral Daily  . clonazePAM  0.5 mg Oral TID  . ferrous sulfate  325 mg Oral Q breakfast  . fluticasone  2 spray Each Nare Daily  .  isosorbide mononitrate  30 mg Oral Daily  . loratadine  10 mg Oral Daily  . losartan  50 mg Oral Daily  . mupirocin ointment  1 application Topical TID  . pantoprazole  40 mg Oral Q1200  . pneumococcal 23 valent vaccine  0.5 mL Intramuscular Tomorrow-1000  . sertraline  100 mg Oral QHS  . simvastatin  20 mg Oral q1800  . sodium chloride  2 spray Nasal Q4H  . sodium chloride  3 mL Intravenous Q12H  . traZODone  100 mg Oral BID  . warfarin  4 mg Oral Q T,Th,S,Su-1800  . warfarin  6 mg Oral Q M,W,F-1800  . DISCONTD: sodium chloride  3 mL Intravenous Q12H   Continuous Infusions:  PRN Meds:.acetaminophen, acetaminophen, albuterol, nitroGLYCERIN, ondansetron  (ZOFRAN) IV, ondansetron, zolpidem  Assessment/Plan: 1. Atypical chest pain.  Resolved.  Patient ruled out for acute coronary syndrome.  Continue aspirin.  Patient was started on isosorbide mononitrate by cardiology 30 mg daily.  2.  History of A. fib.  Patient rate controlled continue Coumadin per pharmacy.  3.  Hypertension.  Stable continue to monitor.  Further titration as outpatient.  Patient started on isosorbide mononitrate during the course of hospital stay.  4.  Anemia.  Due to iron deficiency anemia.  Continue for ferrous sulfate.  5. Asthma.  Stable.  6.  Hyperlipidemia.  Continue statin.  7. CVA (cerebral infarction).  Continue aspirin.  8.  Prophylaxis.  Therapeutic INR.  9.  Disposition.  Pending.  Discharge the patient to assisted living facility today.   LOS: 1 day  Chiamaka Latka A, MD 01/08/2012, 10:46 AM

## 2012-01-08 NOTE — Discharge Summary (Signed)
Discharge Summary  Cindy [name] MR#: [id number]  DOB:07/28/1941  Date of Admission: 01/07/2012 Date of Discharge: 01/08/2012  Patient's PCP: Florentina Jenny, MD, MD  Attending Physician:Jim Philemon A  Consults: Treatment Team:  Corky Crafts, MD, Cardiology  Discharge Diagnoses: Principal Problem:  *Chest pain Active Problems:  BARRETTS ESOPHAGUS  Asthma  A-fib  CVA (cerebral infarction)  Anemia  GERD (gastroesophageal reflux disease)  Depression  Anxiety  Hypertension  Stroke  Brief Admitting History and Physical 71 year old Caucasian female with history of CVA during left-sided carotid endarterectomy, hypertension, A. fib on chronic anticoagulation, asthma who lives at assisted living facility presented with complaints of chest pain on 01/07/2012.  Discharge Medications Medication List  As of 01/08/2012 10:54 AM   TAKE these medications         acetaminophen 325 MG tablet   Commonly known as: TYLENOL   Take 650 mg by mouth every evening. 8pm      albuterol 108 (90 BASE) MCG/ACT inhaler   Commonly known as: PROVENTIL HFA;VENTOLIN HFA   Inhale 2 puffs into the lungs every 6 (six) hours as needed. For shortness of breath      alendronate 70 MG tablet   Commonly known as: FOSAMAX   Take 70 mg by mouth every 7 (seven) days. Take with a full glass of water on an empty stomach.      aspirin 81 MG chewable tablet   Chew 81 mg by mouth daily.      calcium citrate-vitamin D 200-200 MG-UNIT Tabs   Take 1 tablet by mouth daily.      cetirizine 10 MG tablet   Commonly known as: ZYRTEC   Take 10 mg by mouth daily.      clonazePAM 0.5 MG tablet   Commonly known as: KLONOPIN   Take 0.5 mg by mouth 3 (three) times daily.      ferrous sulfate 325 (65 FE) MG tablet   Take 325 mg by mouth daily with breakfast.      fluticasone 50 MCG/ACT nasal spray   Commonly known as: FLONASE   Place 2 sprays into the nose daily.      guaiFENesin 100 MG/5ML liquid   Commonly  known as: ROBITUSSIN   Take 200 mg by mouth 3 (three) times daily as needed. For cough      isosorbide mononitrate 30 MG 24 hr tablet   Commonly known as: IMDUR   Take 1 tablet (30 mg total) by mouth daily.      losartan 50 MG tablet   Commonly known as: COZAAR   Take 50 mg by mouth daily.      Melatonin 1 MG Tabs   Take 2 mg by mouth at bedtime.      mupirocin ointment 2 %   Commonly known as: BACTROBAN   Apply 1 application topically 3 (three) times daily. Apply to abdomen,arm, and upper chest      nitroGLYCERIN 0.4 MG SL tablet   Commonly known as: NITROSTAT   Place 0.4 mg under the tongue every 5 (five) minutes as needed. For chest pain      omeprazole 20 MG capsule   Commonly known as: PRILOSEC   Take 40 mg by mouth daily.      OVER THE COUNTER MEDICATION   Place 1 drop into both ears every 7 (seven) days. Unknown day of the week not specified on mar      sertraline 100 MG tablet   Commonly known as: ZOLOFT   Take  100 mg by mouth at bedtime.      simvastatin 20 MG tablet   Commonly known as: ZOCOR   Take 20 mg by mouth at bedtime.      sodium chloride 0.65 % Soln nasal spray   Commonly known as: OCEAN   Place 2 sprays into the nose every 4 (four) hours. Scheduled while awake only      traZODone 100 MG tablet   Commonly known as: DESYREL   Take 100 mg by mouth 2 (two) times daily.      triamcinolone cream 0.1 %   Commonly known as: KENALOG   Apply 1 application topically 2 (two) times daily as needed. Apply to affected area      warfarin 4 MG tablet   Commonly known as: COUMADIN   Take 4 mg by mouth every Tuesday, Thursday, Saturday, and Sunday.      warfarin 6 MG tablet   Commonly known as: COUMADIN   Take 6 mg by mouth every Monday, Wednesday, and Friday.      zolpidem 5 MG tablet   Commonly known as: AMBIEN   Take 5 mg by mouth at bedtime as needed. For sleep            Hospital Course: 1. Atypical chest pain.  Patient was admitted to  telemetry and was ruled out for acute coronary syndrome.  Dr. Mayford Knife and Dr. Anne Fu with cardiology evaluated the patient.  Patient was started on isosorbide mononitrate 30 mg daily per cardiology.  Continue the patient on aspirin.  After the patient was ruled out for acute coronary syndrome.  Cardiology recommended no further workup other than medical management at this time.    2. History of A. fib. Patient rate controlled continue Coumadin per pharmacy.  Patient to have PT/INR checked on 2/11 or 01/11/2012.  3. Hypertension.  Was slightly uncontrolled and was borderline high, patient was started on isosorbide mononitrate during the course of hospital stay. Further titration as outpatient.   4. Anemia. Due to iron deficiency anemia. Continue for ferrous sulfate.  Hemoglobin stable during the course of hospital stay.  5. Asthma. Stable.   6. Hyperlipidemia. Continue statin.   7. history of CVA (cerebral infarction). Continue aspirin, and continue anticoagulation for history of A. fib.   Day of Discharge BP 165/72  Pulse 61  Temp(Src) 98.9 F (37.2 C) (Oral)  Resp 18  Ht 5\' 1"  (1.549 m)  Wt 92.625 kg (204 lb 3.2 oz)  BMI 38.58 kg/m2  SpO2 92%  Results for orders placed during the hospital encounter of 01/07/12 (from the past 48 hour(s))  CBC     Status: Abnormal   Collection Time   01/07/12  4:24 AM      Component Value Range Comment   WBC 5.7  4.0 - 10.5 (K/uL)    RBC 4.01  3.87 - 5.11 (MIL/uL)    Hemoglobin 10.7 (*) 12.0 - 15.0 (g/dL)    HCT 05.3 (*) 97.6 - 46.0 (%)    MCV 84.3  78.0 - 100.0 (fL)    MCH 26.7  26.0 - 34.0 (pg)    MCHC 31.7  30.0 - 36.0 (g/dL)    RDW 73.4  19.3 - 79.0 (%)    Platelets 148 (*) 150 - 400 (K/uL)   DIFFERENTIAL     Status: Normal   Collection Time   01/07/12  4:24 AM      Component Value Range Comment   Neutrophils Relative 74  43 -  77 (%)    Neutro Abs 4.2  1.7 - 7.7 (K/uL)    Lymphocytes Relative 17  12 - 46 (%)    Lymphs Abs 0.9  0.7 - 4.0  (K/uL)    Monocytes Relative 6  3 - 12 (%)    Monocytes Absolute 0.4  0.1 - 1.0 (K/uL)    Eosinophils Relative 3  0 - 5 (%)    Eosinophils Absolute 0.2  0.0 - 0.7 (K/uL)    Basophils Relative 0  0 - 1 (%)    Basophils Absolute 0.0  0.0 - 0.1 (K/uL)   BASIC METABOLIC PANEL     Status: Abnormal   Collection Time   01/07/12  4:24 AM      Component Value Range Comment   Sodium 140  135 - 145 (mEq/L)    Potassium 4.2  3.5 - 5.1 (mEq/L)    Chloride 104  96 - 112 (mEq/L)    CO2 29  19 - 32 (mEq/L)    Glucose, Bld 110 (*) 70 - 99 (mg/dL)    BUN 21  6 - 23 (mg/dL)    Creatinine, Ser 1.61  0.50 - 1.10 (mg/dL)    Calcium 9.8  8.4 - 10.5 (mg/dL)    GFR calc non Af Amer 51 (*) >90 (mL/min)    GFR calc Af Amer 59 (*) >90 (mL/min)   PROTIME-INR     Status: Abnormal   Collection Time   01/07/12  4:24 AM      Component Value Range Comment   Prothrombin Time 29.4 (*) 11.6 - 15.2 (seconds)    INR 2.73 (*) 0.00 - 1.49    POCT I-STAT TROPONIN I     Status: Normal   Collection Time   01/07/12  4:37 AM      Component Value Range Comment   Troponin i, poc 0.00  0.00 - 0.08 (ng/mL)    Comment 3            CARDIAC PANEL(CRET KIN+CKTOT+MB+TROPI)     Status: Normal   Collection Time   01/07/12  6:55 AM      Component Value Range Comment   Total CK 59  7 - 177 (U/L)    CK, MB 3.0  0.3 - 4.0 (ng/mL)    Troponin I <0.30  <0.30 (ng/mL)    Relative Index RELATIVE INDEX IS INVALID  0.0 - 2.5    MRSA PCR SCREENING     Status: Normal   Collection Time   01/07/12  7:05 AM      Component Value Range Comment   MRSA by PCR NEGATIVE  NEGATIVE    CARDIAC PANEL(CRET KIN+CKTOT+MB+TROPI)     Status: Normal   Collection Time   01/07/12  3:02 PM      Component Value Range Comment   Total CK 54  7 - 177 (U/L)    CK, MB 2.6  0.3 - 4.0 (ng/mL)    Troponin I <0.30  <0.30 (ng/mL)    Relative Index RELATIVE INDEX IS INVALID  0.0 - 2.5    CARDIAC PANEL(CRET KIN+CKTOT+MB+TROPI)     Status: Normal   Collection Time   01/07/12  10:35 PM      Component Value Range Comment   Total CK 61  7 - 177 (U/L)    CK, MB 2.5  0.3 - 4.0 (ng/mL)    Troponin I <0.30  <0.30 (ng/mL)    Relative Index RELATIVE INDEX IS INVALID  0.0 - 2.5  BASIC METABOLIC PANEL     Status: Abnormal   Collection Time   01/08/12  5:00 AM      Component Value Range Comment   Sodium 140  135 - 145 (mEq/L)    Potassium 4.0  3.5 - 5.1 (mEq/L)    Chloride 105  96 - 112 (mEq/L)    CO2 30  19 - 32 (mEq/L)    Glucose, Bld 88  70 - 99 (mg/dL)    BUN 21  6 - 23 (mg/dL)    Creatinine, Ser 1.61  0.50 - 1.10 (mg/dL)    Calcium 09.6  8.4 - 10.5 (mg/dL)    GFR calc non Af Amer 56 (*) >90 (mL/min)    GFR calc Af Amer 64 (*) >90 (mL/min)   CBC     Status: Abnormal   Collection Time   01/08/12  5:00 AM      Component Value Range Comment   WBC 5.9  4.0 - 10.5 (K/uL)    RBC 4.09  3.87 - 5.11 (MIL/uL)    Hemoglobin 10.7 (*) 12.0 - 15.0 (g/dL)    HCT 04.5 (*) 40.9 - 46.0 (%)    MCV 83.9  78.0 - 100.0 (fL)    MCH 26.2  26.0 - 34.0 (pg)    MCHC 31.2  30.0 - 36.0 (g/dL)    RDW 81.1  91.4 - 78.2 (%)    Platelets 164  150 - 400 (K/uL)   PROTIME-INR     Status: Abnormal   Collection Time   01/08/12  5:00 AM      Component Value Range Comment   Prothrombin Time 28.1 (*) 11.6 - 15.2 (seconds)    INR 2.58 (*) 0.00 - 1.49      Dg Chest Port 1 View  01/07/2012  *RADIOLOGY REPORT*  Clinical Data: Chest pain  PORTABLE CHEST - 1 VIEW  Comparison: 07/31/2006  Findings: Cardiomegaly central vascular congestion. Loss of the normal AP window concavity. Otherwise, no overt edema or new areas of consolidation.  No effusion or pneumothorax.  No acute osseous abnormality.  IMPRESSION: Cardiomegaly. Loss of the AP window concavity may be exaggerated by technique/positioning. Recommend PA and lateral radiographs to better characterize.  Original Report Authenticated By: Waneta Martins, M.D.   Disposition: Assisted living facility.  Diet: Heart healthy diet.  Activity: Resume  as tolerated.  Follow-up Appts: Discharge Orders    Future Orders Please Complete By Expires   Diet - low sodium heart healthy      Increase activity slowly      Discharge instructions      Comments:   Followup with TRIPP,HENRY, MD (PCP) in 1 week. Please have your PT/INR checked on 2/11 or 01/11/2012 and have the results sent to your cardiologist Dr. Eldridge Dace [facility]).      TESTS THAT NEED FOLLOW-UP None  Time spent on discharge, talking to the patient, and coordinating care: 25 mins.   Signed: Cristal Ford, MD 01/08/2012, 10:54 AM

## 2012-01-08 NOTE — Progress Notes (Signed)
Subjective:  71 year old with atypical CP, obesity, afib, CVA, HTN  Feels better. OK to go home. No CP, no SOB  Objective:  Vital Signs in the last 24 hours: Temp:  [98 F (36.7 C)-98.9 F (37.2 C)] 98.9 F (37.2 C) (02/09 0322) Pulse Rate:  [57-66] 61  (02/09 0322) Resp:  [18] 18  (02/09 0322) BP: (141-165)/(72-75) 165/72 mmHg (02/09 0322) SpO2:  [92 %-93 %] 92 % (02/09 0322)  Intake/Output from previous day: 02/08 0701 - 02/09 0700 In: 980 [P.O.:980] Out: -    Physical Exam: General: Well developed, well nourished, in no acute distress, awakened from sleep. Head:  Normocephalic and atraumatic. Lungs: Clear to auscultation and percussion. Heart: Normal S1 and S2.  No murmur, rubs or gallops.  Pulses: Pulses normal in all 4 extremities. Abdomen: soft, non-tender, positive bowel sounds. Obese Extremities: No clubbing or cyanosis. No edema. Neurologic: Alert and oriented x 3.    Lab Results:  Basename 01/08/12 0500 01/07/12 0424  WBC 5.9 5.7  HGB 10.7* 10.7*  PLT 164 148*    Basename 01/08/12 0500 01/07/12 0424  NA 140 140  K 4.0 4.2  CL 105 104  CO2 30 29  GLUCOSE 88 110*  BUN 21 21  CREATININE 0.99 1.06    Basename 01/07/12 2235 01/07/12 1502  TROPONINI <0.30 <0.30  Imaging: Dg Chest Port 1 View  01/07/2012  *RADIOLOGY REPORT*  Clinical Data: Chest pain  PORTABLE CHEST - 1 VIEW  Comparison: 07/31/2006  Findings: Cardiomegaly central vascular congestion. Loss of the normal AP window concavity. Otherwise, no overt edema or new areas of consolidation.  No effusion or pneumothorax.  No acute osseous abnormality.  IMPRESSION: Cardiomegaly. Loss of the AP window concavity may be exaggerated by technique/positioning. Recommend PA and lateral radiographs to better characterize.  Original Report Authenticated By: Waneta Martins, M.D.   Personally viewed.   Telemetry: Occasional PAC's, SR Personally viewed.   EKG:  SB 56, NSSTW changes  Cardiac Studies:  Recent  NUC low risk  Assessment/Plan:  Principal Problem:  *Chest pain Active Problems:  Asthma  A-fib  CVA (cerebral infarction)  Anemia   Given her negative cardiac markers and resolution of CP, I am fine with her discharge back to facility.  Continue with coumadin, med mgt. No further cardiac testing needed. Discussed with primary cardiologist Dr. Eldridge Dace yesterday. Imdur started.   SKAINS, MARK 01/08/2012, 10:08 AM

## 2012-04-08 ENCOUNTER — Emergency Department (HOSPITAL_COMMUNITY)
Admission: EM | Admit: 2012-04-08 | Discharge: 2012-04-08 | Disposition: A | Discharge status: Home / Self Care | Payer: Medicare Other | Attending: Emergency Medicine | Admitting: Emergency Medicine

## 2012-04-08 ENCOUNTER — Encounter (HOSPITAL_COMMUNITY): Payer: Self-pay | Admitting: Emergency Medicine

## 2012-04-08 ENCOUNTER — Emergency Department (HOSPITAL_COMMUNITY): Payer: Medicare Other

## 2012-04-08 DIAGNOSIS — I4891 Unspecified atrial fibrillation: Secondary | ICD-10-CM | POA: Insufficient documentation

## 2012-04-08 DIAGNOSIS — W19XXXA Unspecified fall, initial encounter: Secondary | ICD-10-CM

## 2012-04-08 DIAGNOSIS — Z79899 Other long term (current) drug therapy: Secondary | ICD-10-CM | POA: Insufficient documentation

## 2012-04-08 DIAGNOSIS — Z87891 Personal history of nicotine dependence: Secondary | ICD-10-CM | POA: Insufficient documentation

## 2012-04-08 DIAGNOSIS — S8010XA Contusion of unspecified lower leg, initial encounter: Secondary | ICD-10-CM | POA: Insufficient documentation

## 2012-04-08 DIAGNOSIS — S5010XA Contusion of unspecified forearm, initial encounter: Secondary | ICD-10-CM | POA: Insufficient documentation

## 2012-04-08 DIAGNOSIS — F411 Generalized anxiety disorder: Secondary | ICD-10-CM | POA: Insufficient documentation

## 2012-04-08 DIAGNOSIS — Z8673 Personal history of transient ischemic attack (TIA), and cerebral infarction without residual deficits: Secondary | ICD-10-CM | POA: Insufficient documentation

## 2012-04-08 DIAGNOSIS — J45909 Unspecified asthma, uncomplicated: Secondary | ICD-10-CM | POA: Insufficient documentation

## 2012-04-08 DIAGNOSIS — R6889 Other general symptoms and signs: Secondary | ICD-10-CM | POA: Insufficient documentation

## 2012-04-08 DIAGNOSIS — Y9210 Unspecified residential institution as the place of occurrence of the external cause: Secondary | ICD-10-CM | POA: Insufficient documentation

## 2012-04-08 DIAGNOSIS — Z9861 Coronary angioplasty status: Secondary | ICD-10-CM | POA: Insufficient documentation

## 2012-04-08 DIAGNOSIS — T07XXXA Unspecified multiple injuries, initial encounter: Secondary | ICD-10-CM

## 2012-04-08 DIAGNOSIS — R51 Headache: Secondary | ICD-10-CM | POA: Insufficient documentation

## 2012-04-08 DIAGNOSIS — K219 Gastro-esophageal reflux disease without esophagitis: Secondary | ICD-10-CM | POA: Insufficient documentation

## 2012-04-08 DIAGNOSIS — W010XXA Fall on same level from slipping, tripping and stumbling without subsequent striking against object, initial encounter: Secondary | ICD-10-CM | POA: Insufficient documentation

## 2012-04-08 DIAGNOSIS — S0003XA Contusion of scalp, initial encounter: Secondary | ICD-10-CM | POA: Insufficient documentation

## 2012-04-08 LAB — CBC
HCT: 35.5 % — ABNORMAL LOW (ref 36.0–46.0)
Hemoglobin: 11.8 g/dL — ABNORMAL LOW (ref 12.0–15.0)
MCHC: 33.2 g/dL (ref 30.0–36.0)
RBC: 4.2 MIL/uL (ref 3.87–5.11)
WBC: 5.8 10*3/uL (ref 4.0–10.5)

## 2012-04-08 LAB — DIFFERENTIAL
Basophils Relative: 0 % (ref 0–1)
Lymphocytes Relative: 20 % (ref 12–46)
Lymphs Abs: 1.1 10*3/uL (ref 0.7–4.0)
Monocytes Absolute: 0.4 10*3/uL (ref 0.1–1.0)
Monocytes Relative: 7 % (ref 3–12)
Neutro Abs: 4.1 10*3/uL (ref 1.7–7.7)
Neutrophils Relative %: 71 % (ref 43–77)

## 2012-04-08 LAB — URINALYSIS, ROUTINE W REFLEX MICROSCOPIC
Hgb urine dipstick: NEGATIVE
Specific Gravity, Urine: 1.007 (ref 1.005–1.030)
Urobilinogen, UA: 0.2 mg/dL (ref 0.0–1.0)
pH: 6 (ref 5.0–8.0)

## 2012-04-08 LAB — POCT I-STAT, CHEM 8
BUN: 19 mg/dL (ref 6–23)
Creatinine, Ser: 1.1 mg/dL (ref 0.50–1.10)
Glucose, Bld: 94 mg/dL (ref 70–99)
Potassium: 4.5 mEq/L (ref 3.5–5.1)
Sodium: 137 mEq/L (ref 135–145)

## 2012-04-08 NOTE — ED Notes (Signed)
Patient transported to CT 

## 2012-04-08 NOTE — ED Notes (Signed)
Ice pack applied to right forearm and right lower leg.

## 2012-04-08 NOTE — Discharge Instructions (Signed)
Cindy [name] had physical examination, laboratory tests, and CT x-ray of the head to check on her after she fell today. She had normal laboratory tests, INR was 2.89, which is therapeutic, and negative head CT. She can return to her facility and resume her regular medications. She should apply an ice pack to her right shin for 15 minutes 4 times a day to prevent the contusion there from going into a larger contusion.

## 2012-04-08 NOTE — ED Notes (Signed)
Patient brought in via [facility] S/P fall, at a Nursing Home. Abrasions noted to forehead, right forearm and  right shin, No LOC patient was able to get up without assistance. Alert and oriented to normal status.

## 2012-04-08 NOTE — ED Provider Notes (Signed)
History     CSN: [id number]  Arrival date & time 04/08/12  1610   First MD Initiated Contact with Patient 04/08/12 1612      Chief Complaint  Patient presents with  . Fall    (Consider location/radiation/quality/duration/timing/severity/associated sxs/prior treatment) HPI Comments: Patient is a 71 year old woman living in an assisted-living facility. She was outside feeding the birds, tripped over her feet, and fell, striking her forehead against a concrete object. She also suffered bruising on her right forearm and right shin. She was not rendered unconscious. She was therefore sent to [facility] for evaluation. Review of prior history shows that she has had a stroke following a carotid endarterectomy. She is on Coumadin for chronic atrial fibrillation.  Patient is a 71 y.o. female presenting with fall.  Fall The accident occurred 1 to 2 hours ago. Incident: While feeding birds. She fell from a height of 1 to 2 ft. She landed on concrete. There was no blood loss. The point of impact was the head (Right forearm, right shin.  ). The pain is present in the head. The pain is at a severity of 2/10. The pain is mild. She was ambulatory at the scene. There was no entrapment after the fall. There was no drug use involved in the accident. There was no alcohol use involved in the accident. Pertinent negatives include no loss of consciousness. Exacerbated by: Nothing. She has tried nothing for the symptoms.    Past Medical History  Diagnosis Date  . Stroke     2007  . Asthma   . Bronchitis   . Hypertension   . Recurrent upper respiratory infection (URI)   . GERD (gastroesophageal reflux disease)   . Depression   . Anxiety   . Dysrhythmia     atrial fibrillation  . Chest pain     Past Surgical History  Procedure Date  . Carotid artery angioplasty   . Abdominal hysterectomy   . Cholecystectomy   . Partial colectomy   . Esophagogastroduodenoscopy 12/01/2011    Procedure:  ESOPHAGOGASTRODUODENOSCOPY (EGD);  Surgeon: Selisa Friar, MD;  Location: [facility];  Service: Endoscopy;  Laterality: N/A;  . Colonoscopy 12/01/2011    Procedure: COLONOSCOPY;  Surgeon: Jazmon Friar, MD;  Location: [facility];  Service: Endoscopy;  Laterality: N/A;  . Hot hemostasis 12/01/2011    Procedure: HOT HEMOSTASIS (ARGON PLASMA COAGULATION/BICAP);  Surgeon: Brianna Friar, MD;  Location: [facility];  Service: Endoscopy;  Laterality: N/A;    No family history on file.  History  Substance Use Topics  . Smoking status: Former Smoker    Quit date: 04/09/2003  . Smokeless tobacco: Never Used  . Alcohol Use: No    OB History    Grav Para Term Preterm Abortions TAB SAB Ect Mult Living                  Review of Systems  Constitutional: Negative.   HENT:       Contusion of left forehead.   Eyes: Negative.   Respiratory: Negative.   Gastrointestinal: Negative.   Genitourinary: Negative.   Musculoskeletal: Negative.   Skin:       Contusions of left forehead, Left forearm, left shin.    Neurological: Negative.  Negative for loss of consciousness.  Psychiatric/Behavioral: Negative.     Allergies  Review of patient's allergies indicates no known allergies.  Home Medications   Current Outpatient Rx  Name Route Sig Dispense Refill  . ACETAMINOPHEN 325  MG PO TABS Oral Take 650 mg by mouth every 4 (four) hours as needed. For pain    . ACETAMINOPHEN 325 MG PO TABS Oral Take 650 mg by mouth daily at 8 pm.    . ALBUTEROL SULFATE HFA 108 (90 BASE) MCG/ACT IN AERS Inhalation Inhale 2 puffs into the lungs every 6 (six) hours as needed. For shortness of breath    . ALENDRONATE SODIUM 70 MG PO TABS Oral Take 70 mg by mouth every 7 (seven) days. Take with a full glass of water on an empty stomach.     . ASPIRIN 81 MG PO CHEW Oral Chew 81 mg by mouth daily.      Marland Kitchen CALCIUM CITRATE-VITAMIN D 200-200 MG-UNIT PO TABS Oral Take 1 tablet by mouth daily.      Marland Kitchen  CETIRIZINE HCL 10 MG PO TABS Oral Take 10 mg by mouth daily.      Marland Kitchen CLONAZEPAM 0.5 MG PO TABS Oral Take 0.5 mg by mouth 3 (three) times daily.      Marland Kitchen FERROUS SULFATE 325 (65 FE) MG PO TABS Oral Take 325 mg by mouth daily with breakfast.      . FLUTICASONE PROPIONATE 50 MCG/ACT NA SUSP Nasal Place 2 sprays into the nose daily.      . GUAIFENESIN 100 MG/5ML PO LIQD Oral Take 200 mg by mouth 3 (three) times daily as needed. For cough    . ISOSORBIDE MONONITRATE ER 30 MG PO TB24 Oral Take 1 tablet (30 mg total) by mouth daily. 30 tablet 0  . LOSARTAN POTASSIUM 100 MG PO TABS Oral Take 100 mg by mouth daily.    Marland Kitchen MELATONIN 1 MG PO TABS Oral Take 2 mg by mouth at bedtime.     Marland Kitchen NITROGLYCERIN 0.4 MG SL SUBL Sublingual Place 0.4 mg under the tongue every 5 (five) minutes as needed. For chest pain    . OMEPRAZOLE 20 MG PO CPDR Oral Take 40 mg by mouth daily.      Marland Kitchen OVER THE COUNTER MEDICATION Both Ears Place 1 drop into both ears every 7 (seven) days. Mineral Oil    . SERTRALINE HCL 100 MG PO TABS Oral Take 100 mg by mouth at bedtime.     Marland Kitchen SIMVASTATIN 20 MG PO TABS Oral Take 20 mg by mouth at bedtime.      Marland Kitchen SALINE 0.65 % NA SOLN Nasal Place 2 sprays into the nose every 4 (four) hours. Scheduled while awake only    . TIOTROPIUM BROMIDE MONOHYDRATE 18 MCG IN CAPS Inhalation Place 18 mcg into inhaler and inhale daily.    . TRAZODONE HCL 100 MG PO TABS Oral Take 100 mg by mouth 2 (two) times daily.      . TRIAMCINOLONE ACETONIDE 0.1 % EX CREA Topical Apply 1 application topically 2 (two) times daily. Apply to affected area    . WARFARIN SODIUM 4 MG PO TABS Oral Take 4 mg by mouth daily.     Marland Kitchen ZOLPIDEM TARTRATE 5 MG PO TABS Oral Take 5 mg by mouth at bedtime as needed. For sleep      BP 132/61  Pulse 79  Temp(Src) 98.3 F (36.8 C) (Oral)  Resp 18  SpO2 99%  Physical Exam  Nursing note and vitals reviewed. Constitutional: She is oriented to person, place, and time. She appears well-developed and  well-nourished. No distress.  HENT:       She has a 1 cm contusion on the left forehead by  the hairline.  There is no palpable skull or facial bony deformity.  Eyes: Conjunctivae and EOM are normal. Pupils are equal, round, and reactive to light.  Neck: Normal range of motion. Neck supple.       Well-healed left carotid endarterectomy scar.  Cardiovascular: Normal rate, regular rhythm and normal heart sounds.   Pulmonary/Chest: Effort normal and breath sounds normal.  Abdominal: Soft. Bowel sounds are normal.  Musculoskeletal: Normal range of motion. She exhibits no edema and no tenderness.  Neurological: She is alert and oriented to person, place, and time.       No sensory or motor deficit.  Skin: Skin is warm and dry.       She as a 2 x 2 cm contusion on the volar aspect of the right forearm.  She has a 3 x 3 cm contusion on the right anterior shin.  There is no bony deformity, and no loss of pulse, sensation or tendon function in the arms and legs.  Psychiatric: She has a normal mood and affect. Her behavior is normal.    ED Course  Procedures (including critical care time)   Labs Reviewed  CBC  DIFFERENTIAL  URINALYSIS, ROUTINE W REFLEX MICROSCOPIC  URINE CULTURE  PROTIME-INR   4:38 PM Pt was seen and had physical examination.  Lab tests and CT of head ordered.  Ice pack to contusion on right shin.  Old charts reviewed.    7:30 PM Results for orders placed during the hospital encounter of 04/08/12  CBC      Component Value Range   WBC 5.8  4.0 - 10.5 (K/uL)   RBC 4.20  3.87 - 5.11 (MIL/uL)   Hemoglobin 11.8 (*) 12.0 - 15.0 (g/dL)   HCT 16.1 (*) 09.6 - 46.0 (%)   MCV 84.5  78.0 - 100.0 (fL)   MCH 28.1  26.0 - 34.0 (pg)   MCHC 33.2  30.0 - 36.0 (g/dL)   RDW 04.5  40.9 - 81.1 (%)   Platelets 173  150 - 400 (K/uL)  DIFFERENTIAL      Component Value Range   Neutrophils Relative 71  43 - 77 (%)   Neutro Abs 4.1  1.7 - 7.7 (K/uL)   Lymphocytes Relative 20  12 - 46 (%)    Lymphs Abs 1.1  0.7 - 4.0 (K/uL)   Monocytes Relative 7  3 - 12 (%)   Monocytes Absolute 0.4  0.1 - 1.0 (K/uL)   Eosinophils Relative 3  0 - 5 (%)   Eosinophils Absolute 0.2  0.0 - 0.7 (K/uL)   Basophils Relative 0  0 - 1 (%)   Basophils Absolute 0.0  0.0 - 0.1 (K/uL)  URINALYSIS, ROUTINE W REFLEX MICROSCOPIC      Component Value Range   Color, Urine YELLOW  YELLOW    APPearance CLEAR  CLEAR    Specific Gravity, Urine 1.007  1.005 - 1.030    pH 6.0  5.0 - 8.0    Glucose, UA NEGATIVE  NEGATIVE (mg/dL)   Hgb urine dipstick NEGATIVE  NEGATIVE    Bilirubin Urine NEGATIVE  NEGATIVE    Ketones, ur NEGATIVE  NEGATIVE (mg/dL)   Protein, ur NEGATIVE  NEGATIVE (mg/dL)   Urobilinogen, UA 0.2  0.0 - 1.0 (mg/dL)   Nitrite NEGATIVE  NEGATIVE    Leukocytes, UA NEGATIVE  NEGATIVE   PROTIME-INR      Component Value Range   Prothrombin Time 30.7 (*) 11.6 - 15.2 (seconds)   INR  2.89 (*) 0.00 - 1.49   POCT I-STAT, CHEM 8      Component Value Range   Sodium 137  135 - 145 (mEq/L)   Potassium 4.5  3.5 - 5.1 (mEq/L)   Chloride 103  96 - 112 (mEq/L)   BUN 19  6 - 23 (mg/dL)   Creatinine, Ser 2.95  0.50 - 1.10 (mg/dL)   Glucose, Bld 94  70 - 99 (mg/dL)   Calcium, Ion 6.21  3.08 - 1.32 (mmol/L)   TCO2 29  0 - 100 (mmol/L)   Hemoglobin 11.9 (*) 12.0 - 15.0 (g/dL)   HCT 65.7 (*) 84.6 - 46.0 (%)   Ct Head Wo Contrast  04/08/2012  *RADIOLOGY REPORT*  Clinical Data: Fall  CT HEAD WITHOUT CONTRAST  Technique:  Contiguous axial images were obtained from the base of the skull through the vertex without contrast.  Comparison: 10/24/2010  Findings: No skull fracture is noted.  No intracranial hemorrhage, mass effect or midline shift.  Atherosclerotic calcifications of the carotid siphon again noted.  Stable cerebral atrophy.  Stable remote infarcts in the left frontal lobe and posterior aspect of the right parietal lobe.  Ventricular size is stable from prior exam.  Periventricular and subcortical white matter  decreased attenuation probable due to chronic small vessel ischemic changes.  No definite acute cortical infarction.  No mass lesion is noted on this unenhanced scan. Bilateral basal ganglia punctate calcifications are again noted.  IMPRESSION: No acute intracranial abnormality.  Stable remote infarcts in left frontal lobe and right posterior parietal lobe.  Stable atrophy and chronic white matter disease.  Original Report Authenticated By: Natasha Mead, M.D.    INR was therapeutic. CT of the head showed no intracranial bleeding. Patient laboratory tests were all essentially normal. She was reassured and released.  1. Fall   2. Contusion of multiple sites          Carleene Cooper III, MD 04/08/12 [phone]

## 2012-04-10 LAB — URINE CULTURE
Culture  Setup Time: 201305120206
Culture: NO GROWTH

## 2012-05-25 ENCOUNTER — Other Ambulatory Visit: Payer: Self-pay | Admitting: Gastroenterology

## 2012-05-25 DIAGNOSIS — R110 Nausea: Secondary | ICD-10-CM

## 2012-05-25 DIAGNOSIS — R109 Unspecified abdominal pain: Secondary | ICD-10-CM

## 2012-05-26 ENCOUNTER — Inpatient Hospital Stay: Admission: RE | Admit: 2012-05-26 | Payer: Medicare Other | Source: Ambulatory Visit

## 2012-05-29 ENCOUNTER — Ambulatory Visit
Admission: RE | Admit: 2012-05-29 | Discharge: 2012-05-29 | Disposition: A | Discharge status: Home / Self Care | Payer: Medicare Other | Source: Ambulatory Visit | Attending: Gastroenterology | Admitting: Gastroenterology

## 2012-05-29 DIAGNOSIS — R109 Unspecified abdominal pain: Secondary | ICD-10-CM

## 2012-05-29 DIAGNOSIS — R110 Nausea: Secondary | ICD-10-CM

## 2012-05-29 MED ORDER — IOHEXOL 300 MG/ML  SOLN
80.0000 mL | Freq: Once | INTRAMUSCULAR | Status: AC | PRN
  Administered 2012-05-29: 80 mL via INTRAVENOUS

## 2012-07-17 ENCOUNTER — Encounter (INDEPENDENT_AMBULATORY_CARE_PROVIDER_SITE_OTHER): Payer: Self-pay | Admitting: General Surgery

## 2012-07-17 ENCOUNTER — Ambulatory Visit (INDEPENDENT_AMBULATORY_CARE_PROVIDER_SITE_OTHER): Payer: Medicare Other | Admitting: General Surgery

## 2012-07-17 VITALS — BP 140/64 | HR 68 | Temp 98.1°F | Resp 16 | Ht 60.0 in | Wt 205.2 lb

## 2012-07-17 DIAGNOSIS — R109 Unspecified abdominal pain: Secondary | ICD-10-CM

## 2012-07-17 DIAGNOSIS — K432 Incisional hernia without obstruction or gangrene: Secondary | ICD-10-CM

## 2012-07-17 NOTE — Progress Notes (Signed)
Patient ID: Cindy [name], female   DOB: January 25, 1941, 71 y.o.   MRN: [id number]  Chief Complaint  Patient presents with  . Other    eval abd pain    HPI Cindy [name] is a 71 y.o. female.  Referred by Dr. Bosie Clos HPI This is a 70 year old female with multiple medical problems as listed in her H&P who presents after which she describes as about a month or so history of a area that is chloroquine in her upper abdomen. She also complains of nausea. She has had a couple episodes of emesis. She is having normal bowel movements without any difficulty there is been no change in. There is no relation to any food or any other activity she notes. She thinks she may have had been nausea and emesis from some sinus drainage she had the same time as well. She has undergone a colonoscopy in January and had a polyp present. She also has a history of GAVE.  I've obtained her prior operative report from the other hospital that showed she apparently had ischemic colitis with a subsequent stricture was taken to the operating room and when a segmental colectomy with a stapled anastomosis as well as an incidental cholecystectomy at the same time. She comes in today to discuss her abdominal pain and a possible surgical therapy. Past Medical History  Diagnosis Date  . Stroke     2007  . Asthma   . Bronchitis   . Hypertension   . Recurrent upper respiratory infection (URI)   . GERD (gastroesophageal reflux disease)   . Depression   . Anxiety   . Dysrhythmia     atrial fibrillation  . Chest pain   . Cancer     [facility]    Past Surgical History  Procedure Date  . Carotid artery angioplasty   . Abdominal hysterectomy   . Cholecystectomy   . Partial colectomy   . Esophagogastroduodenoscopy 12/01/2011    Procedure: ESOPHAGOGASTRODUODENOSCOPY (EGD);  Surgeon: Maryum Friar, MD;  Location: [facility];  Service: Endoscopy;  Laterality: N/A;  . Colonoscopy 12/01/2011    Procedure: COLONOSCOPY;  Surgeon:  Jadin Friar, MD;  Location: [facility];  Service: Endoscopy;  Laterality: N/A;  . Hot hemostasis 12/01/2011    Procedure: HOT HEMOSTASIS (ARGON PLASMA COAGULATION/BICAP);  Surgeon: Trinidad Friar, MD;  Location: [facility];  Service: Endoscopy;  Laterality: N/A;    Family History  Problem Relation Age of Onset  . Cancer Sister     ovarian    Social History History  Substance Use Topics  . Smoking status: Former Smoker    Quit date: 04/09/2003  . Smokeless tobacco: Never Used  . Alcohol Use: No    No Known Allergies  Current Outpatient Prescriptions  Medication Sig Dispense Refill  . acetaminophen (TYLENOL) 325 MG tablet Take 650 mg by mouth every 4 (four) hours as needed. For pain      . acetaminophen (TYLENOL) 325 MG tablet Take 650 mg by mouth daily at 8 pm.      . albuterol (PROVENTIL HFA;VENTOLIN HFA) 108 (90 BASE) MCG/ACT inhaler Inhale 2 puffs into the lungs every 6 (six) hours as needed. For shortness of breath      . alendronate (FOSAMAX) 70 MG tablet Take 70 mg by mouth every 7 (seven) days. Take with a full glass of water on an empty stomach.       Marland Kitchen aspirin 81 MG chewable tablet Chew 81 mg by mouth daily.        Marland Kitchen  cetirizine (ZYRTEC) 10 MG tablet Take 10 mg by mouth daily.        . clonazePAM (KLONOPIN) 0.5 MG tablet Take 0.5 mg by mouth 3 (three) times daily.        . ferrous sulfate 325 (65 FE) MG tablet Take 325 mg by mouth daily with breakfast.        . fluticasone (FLONASE) 50 MCG/ACT nasal spray Place 2 sprays into the nose daily.        Marland Kitchen guaiFENesin (ROBITUSSIN) 100 MG/5ML liquid Take 200 mg by mouth 3 (three) times daily as needed. For cough      . isosorbide mononitrate (IMDUR) 30 MG 24 hr tablet Take 1 tablet (30 mg total) by mouth daily.  30 tablet  0  . losartan (COZAAR) 100 MG tablet Take 100 mg by mouth daily.      . Melatonin 1 MG TABS Take 2 mg by mouth at bedtime.       . nitroGLYCERIN (NITROSTAT) 0.4 MG SL tablet Place 0.4 mg under the  tongue every 5 (five) minutes as needed. For chest pain      . omeprazole (PRILOSEC) 20 MG capsule Take 40 mg by mouth daily.        Marland Kitchen OVER THE COUNTER MEDICATION Place 1 drop into both ears every 7 (seven) days. Mineral Oil      . sertraline (ZOLOFT) 100 MG tablet Take 100 mg by mouth at bedtime.       . simvastatin (ZOCOR) 20 MG tablet Take 20 mg by mouth at bedtime.        . sodium chloride (OCEAN) 0.65 % SOLN nasal spray Place 2 sprays into the nose every 4 (four) hours. Scheduled while awake only      . traZODone (DESYREL) 100 MG tablet Take 100 mg by mouth 2 (two) times daily.        Marland Kitchen triamcinolone cream (KENALOG) 0.1 % Apply 1 application topically 2 (two) times daily. Apply to affected area      . warfarin (COUMADIN) 4 MG tablet Take 4 mg by mouth daily.       Marland Kitchen zolpidem (AMBIEN) 5 MG tablet Take 5 mg by mouth at bedtime as needed. For sleep      . calcium citrate-vitamin D 200-200 MG-UNIT TABS Take 1 tablet by mouth daily.        Marland Kitchen tiotropium (SPIRIVA) 18 MCG inhalation capsule Place 18 mcg into inhaler and inhale daily.        Review of Systems Review of Systems  Constitutional: Negative for fever, chills and unexpected weight change.  HENT: Positive for hearing loss. Negative for congestion, sore throat, trouble swallowing and voice change.   Eyes: Negative for visual disturbance.  Respiratory: Positive for cough and wheezing.   Cardiovascular: Negative for chest pain, palpitations and leg swelling.  Gastrointestinal: Positive for nausea and abdominal pain. Negative for vomiting, diarrhea, constipation, blood in stool, abdominal distention and anal bleeding.  Genitourinary: Negative for hematuria, vaginal bleeding and difficulty urinating.  Musculoskeletal: Positive for arthralgias.  Skin: Negative for rash and wound.  Neurological: Positive for weakness and headaches. Negative for seizures and syncope.  Hematological: Negative for adenopathy. Does not bruise/bleed easily.    Psychiatric/Behavioral: Negative for confusion.    Blood pressure 140/64, pulse 68, temperature 98.1 F (36.7 C), temperature source Temporal, resp. rate 16, height 5' (1.524 m), weight 205 lb 3.2 oz (93.078 kg).  Physical Exam Physical Exam  Vitals reviewed. Constitutional: She appears well-developed  and well-nourished.  Abdominal:      Data Reviewed  CT ABDOMEN AND PELVIS WITH CONTRAST  Technique: Multidetector CT imaging of the abdomen and pelvis was  performed following the standard protocol during bolus  administration of intravenous contrast.  Contrast: 80mL OMNIPAQUE IOHEXOL 300 MG/ML SOLN  Comparison: None.  Findings: A band of atelectasis or scarring in the lingula.  Otherwise, the imaged lung bases are clear. Coronary artery  atherosclerosis is noted. Visualized portion of the heart appears  mildly enlarged. The distal esophagus is distended with oral  contrast and measures up to 3.0 cm AP diameter. There is some  herniation of the abdominal mesentery through the diaphragmatic  hiatus. However, the stomach is normally positioned below the  diaphragm.  The liver contains multiple low density lesions in both lobes. One  of the largest is in the right lobe, and measures 16 mm greatest  diameter. This measures water density, compatible with a cyst.  The majority of the other low density lesions have similar  appearances, but are too small to characterize. There are no prior  imaging studies of the liver for comparison. The liver is normal  in size.  The spleen is normal in size and enhancement. The adrenal glands  and pancreas are normal. There is some atrophy of the renal  cortices bilaterally. There is a 1 cm exophytic cyst extending  from the lower pole of the left kidney. There is no  hydronephrosis.  There is anastomotic suture in the colon at the level of the  splenic flexure. At the level of the suture, the colon is focally  dilated, and measures up to 7.3  cm transverse diameter. This  dilated portion of the colon contains a large amount of stool.  Proximal to the splenic flexure however, the colon is normal in  caliber. The descending colon and sigmoid colon are decompressed  and contain multiple diverticula, and a moderate amount of stool.  There are no focal inflammatory changes to suggest acute  diverticulitis.  Small bowel loops are normal in caliber. There are several fat  density filling defects within multiple small bowel loops, that  appear to be within the center of the bowel loop and not associated  with the bowel wall; therefore are favored to be due to ingested  food particles, likely fat-containing meat, rather than small bowel  lipomas. Patient is status post cholecystectomy and hysterectomy.  There is no adnexal mass. Urinary bladder has normal appearances.  There is extensive atherosclerotic calcification of the abdominal  aorta and iliac vasculature. The infrarenal abdominal aorta  demonstrates focal ectasia, measuring up to 2.4 cm AP diameter.  There is no aneurysm.  Negative for lymphadenopathy or ascites.  There is a supraumbilical midline abdominal wall hernia which  contains fat only. The mouth of the hernia is 4.6 cm. The hernia  sac measures 8.1 x 3.2 x 5.8 cm. There are no herniated bowel  loops.  There is disc space narrowing at L4-L5. There are facet joint  degenerative changes of the lower lumbar spine. There are  degenerative changes of the pubic symphysis. No acute or  suspicious bony abnormality.  IMPRESSION:  1. The colon is focally dilated at the level of the splenic  flexure, where there is anastomotic suture. This portion of the  colon in particular contains a large volume of stool. Question if  there could be a low grade focal partial obstruction (it is noted  that the proximal colon is normal in  caliber, however), focal  ileus, or decreased colonic motility.  2. Fairly extensive colonic  diverticulosis, predominately in the  sigmoid colon. No acute inflammatory changes.  3. The distal thoracic esophagus is distended with oral contrast.  This could reflect gastroesophageal reflux, dysmotility, or distal  esophageal stricture. Esophagram may be useful for further  evaluation.  4. Multiple low-density lesions in the liver. The largest of  these meet the criteria for simple cysts. Many of these low  density lesions are too small to characterize. If there is any  clinical concern for metastatic disease, MRI of the abdomen with  and without contrast could be useful for further evaluation.  5. Atherosclerotic disease of the aorta and iliacs, with focal  ectasia of the infrarenal abdominal aorta.  6. Fat containing ventral abdominal wall hernia.  7. Coronary artery atherosclerosis and cardiomegaly.   Assessment    Incisional hernia Nausea and abdominal pain    Plan    She has an abdominal wall hernia secondary to her prior surgery that is certainly present. I'm not sure that this is related to all of the symptoms she is having right now. She also has an abnormality on her CT scan and her prior colectomy site. She had a colonoscopy in January that didn't look like it showed an abnormality beside that was prior to her having symptoms. I think it would be reasonable to advocate another colonoscopy or barium enema to evaluate this area prior to any discussion of proceeding with surgery. I will discuss this with Dr. Bosie Clos. I thank you for going to discuss any sort of hernia repair with her I would not necessarily tell her this would relieve a lot of her symptoms but would be to prevent incarceration moving forward. She is certainly at high risk given all her medical problems it would require a full medical evaluation before discussion of this. I will plan on seeing her after her we evaluate this area and the colon. I've also obtain her operative report from previous as well.        Kharee Lesesne 07/17/2012, 3:55 PM

## 2012-07-25 ENCOUNTER — Telehealth (INDEPENDENT_AMBULATORY_CARE_PROVIDER_SITE_OTHER): Payer: Self-pay

## 2012-07-25 DIAGNOSIS — R109 Unspecified abdominal pain: Secondary | ICD-10-CM

## 2012-07-25 NOTE — Telephone Encounter (Signed)
LMOM and work for pt's daughter Cindy [name] to call me back. I did leave the detail message of the appt on the cell# stating that I have the pt an appt with Dr Bosie Clos sch. For 07/27/12 arrive at 3:45 for a 4:00. The daughter said she would have to call me back to get the details when I called the facility where the pt lives.

## 2012-07-27 ENCOUNTER — Other Ambulatory Visit: Payer: Self-pay | Admitting: Gastroenterology

## 2012-07-27 DIAGNOSIS — R109 Unspecified abdominal pain: Secondary | ICD-10-CM

## 2012-08-14 ENCOUNTER — Ambulatory Visit
Admission: RE | Admit: 2012-08-14 | Discharge: 2012-08-14 | Disposition: A | Discharge status: Home / Self Care | Payer: Medicare Other | Source: Ambulatory Visit | Attending: Gastroenterology | Admitting: Gastroenterology

## 2012-08-14 ENCOUNTER — Other Ambulatory Visit: Payer: Self-pay | Admitting: Gastroenterology

## 2012-08-14 DIAGNOSIS — R109 Unspecified abdominal pain: Secondary | ICD-10-CM

## 2012-09-25 ENCOUNTER — Encounter (INDEPENDENT_AMBULATORY_CARE_PROVIDER_SITE_OTHER): Payer: Medicare Other | Admitting: General Surgery

## 2012-09-25 ENCOUNTER — Encounter (INDEPENDENT_AMBULATORY_CARE_PROVIDER_SITE_OTHER): Payer: Self-pay | Admitting: General Surgery

## 2012-09-25 ENCOUNTER — Ambulatory Visit (INDEPENDENT_AMBULATORY_CARE_PROVIDER_SITE_OTHER): Payer: Medicare Other | Admitting: General Surgery

## 2012-09-25 VITALS — BP 125/81 | HR 70 | Temp 98.2°F | Resp 22 | Ht 62.0 in | Wt 198.8 lb

## 2012-09-25 DIAGNOSIS — K56699 Other intestinal obstruction unspecified as to partial versus complete obstruction: Secondary | ICD-10-CM

## 2012-09-25 DIAGNOSIS — K432 Incisional hernia without obstruction or gangrene: Secondary | ICD-10-CM

## 2012-09-25 DIAGNOSIS — K56609 Unspecified intestinal obstruction, unspecified as to partial versus complete obstruction: Secondary | ICD-10-CM

## 2012-09-25 NOTE — Progress Notes (Signed)
Subjective:     Patient ID: Cindy [name], female   DOB: 1941/07/10, 71 y.o.   MRN: [id number]  HPI This is a 71 year old female with multiple medical problems as listed in her H&P who presents after which she describes as about a month or so history of abdominal pain. I've obtained her prior operative report from the other hospital that showed she apparently had ischemic colitis with a subsequent stricture was taken to the operating room and when a segmental colectomy with a stapled anastomosis as well as an incidental cholecystectomy at the same time. She also appears to have an incisional hernia at this time. I discussed her with gastroenterology at that point. I obtained a barium enema to reevaluate this area that showed up on her CT scan. The barium enema showed that there is considerable retained feces as well as a caliber change in the splenic flexure suggesting a mild stricture to be present. There is no mass seen in the superior stricture appears smoothly marginated. There is no gross constricting lesion that is noted. She comes back in today to discuss her options and still complains of a fair amount of abdominal pain it is mostly up to her right upper quadrant and right flank at this point. She is having bowel movements through without any difficulty and this really has not changed over this time period   Review of Systems     Objective:   Physical Exam unchanged    Assessment:     Incisional hernia   colonic anastomotic stricture Plan:     I am not entirely sure where most of her symptoms are coming from it this point. I don't think she really has a lot of issues with the stricture although I am concerned about it. I told her that we certainly have several options. If we were going to redo her anastomosis in the operating room that I would not really be able to fix her hernia in appropriate fashion. I think he would be reasonable to consider a colonoscopy and possibly manage this  endoscopically if we need to and see how she does after that. If that is able to be successful we don't need to do anything with that at this point then I think he would also be reasonable to proceed with going ahead with repairing her incisional hernia which could possibly be done laparoscopically. There is certainly some risk for all of these operations and I think would be to obtain perioperative evaluation from her primary care physician as well as her cardiologist prior to beginning this as well. Am going to discuss this with Dr. Bosie Clos in and will get back to the patient.

## 2012-09-29 ENCOUNTER — Telehealth (INDEPENDENT_AMBULATORY_CARE_PROVIDER_SITE_OTHER): Payer: Self-pay | Admitting: General Surgery

## 2012-09-29 NOTE — Telephone Encounter (Signed)
error 

## 2012-10-18 ENCOUNTER — Encounter (HOSPITAL_COMMUNITY): Payer: Self-pay

## 2012-10-18 ENCOUNTER — Ambulatory Visit (HOSPITAL_COMMUNITY)
Admission: RE | Admit: 2012-10-18 | Discharge: 2012-10-18 | Disposition: A | Discharge status: Home / Self Care | Payer: Medicare Other | Source: Ambulatory Visit | Attending: Gastroenterology | Admitting: Gastroenterology

## 2012-10-18 ENCOUNTER — Encounter (HOSPITAL_COMMUNITY)
Admission: RE | Disposition: A | Discharge status: Home / Self Care | Payer: Self-pay | Source: Ambulatory Visit | Attending: Gastroenterology

## 2012-10-18 ENCOUNTER — Encounter (HOSPITAL_COMMUNITY): Payer: Self-pay | Admitting: *Deleted

## 2012-10-18 DIAGNOSIS — R109 Unspecified abdominal pain: Secondary | ICD-10-CM | POA: Insufficient documentation

## 2012-10-18 DIAGNOSIS — Z980 Intestinal bypass and anastomosis status: Secondary | ICD-10-CM | POA: Insufficient documentation

## 2012-10-18 DIAGNOSIS — R1084 Generalized abdominal pain: Secondary | ICD-10-CM | POA: Diagnosis present

## 2012-10-18 HISTORY — PX: BALLOON DILATION: SHX5330

## 2012-10-18 HISTORY — PX: COLONOSCOPY: SHX5424

## 2012-10-18 SURGERY — COLONOSCOPY
Anesthesia: Moderate Sedation

## 2012-10-18 MED ORDER — MIDAZOLAM HCL 10 MG/2ML IJ SOLN
INTRAMUSCULAR | Status: AC
  Filled 2012-10-18: qty 2

## 2012-10-18 MED ORDER — FENTANYL CITRATE 0.05 MG/ML IJ SOLN
INTRAMUSCULAR | Status: AC
  Filled 2012-10-18: qty 2

## 2012-10-18 MED ORDER — DIPHENHYDRAMINE HCL 50 MG/ML IJ SOLN
INTRAMUSCULAR | Status: AC
  Filled 2012-10-18: qty 1

## 2012-10-18 MED ORDER — DIPHENHYDRAMINE HCL 50 MG/ML IJ SOLN
INTRAMUSCULAR | Status: DC | PRN
  Administered 2012-10-18: 25 mg via INTRAVENOUS

## 2012-10-18 MED ORDER — SODIUM CHLORIDE 0.9 % IV SOLN: INTRAVENOUS | Status: DC

## 2012-10-18 MED ORDER — MIDAZOLAM HCL 5 MG/5ML IJ SOLN
INTRAMUSCULAR | Status: DC | PRN
  Administered 2012-10-18 (×5): 2.5 mg via INTRAVENOUS

## 2012-10-18 MED ORDER — FENTANYL CITRATE 0.05 MG/ML IJ SOLN
INTRAMUSCULAR | Status: DC | PRN
  Administered 2012-10-18 (×5): 25 ug via INTRAVENOUS

## 2012-10-18 NOTE — H&P (Signed)
  Date of Initial H&P: 10/16/12  History reviewed, patient examined, no change in status, stable for surgery.

## 2012-10-18 NOTE — Interval H&P Note (Signed)
History and Physical Interval Note:  10/18/2012 1:22 PM  Cindy [name]  has presented today for surgery, with the diagnosis of colon ic dilation for astomosis  The various methods of treatment have been discussed with the patient and family. After consideration of risks, benefits and other options for treatment, the patient has consented to  Procedure(s) (LRB) with comments: COLONOSCOPY (N/A) - colonic dilation BALLOON DILATION (N/A) as a surgical intervention .  The patient's history has been reviewed, patient examined, no change in status, stable for surgery.  I have reviewed the patient's chart and labs.  Questions were answered to the patient's satisfaction.     Triana Coover C.

## 2012-10-18 NOTE — Op Note (Signed)
[facility] [address] [place], [address]   COLONOSCOPY PROCEDURE REPORT  PATIENT: Cindy, [name].  MR#: [id number] BIRTHDATE: Apr 19, 1941 , 71  yrs. old GENDER: Female ENDOSCOPIST: Charlott Rakes, MD REFERRED [id number] Dwain Sarna, M.D. PROCEDURE DATE:  10/18/2012 PROCEDURE:   Colonoscopy, diagnostic ASA CLASS:   Class III INDICATIONS:Abdominal pain. MEDICATIONS: Fentanyl 125 mcg IV, Versed-Detailed 12.5 mg IV, and Benadryl 25 mg IV  DESCRIPTION OF PROCEDURE:   After the risks benefits and alternatives of the procedure were thoroughly explained, informed consent was obtained.  The Pentax Ped Colon [id number]  endoscope was introduced through the anus and advanced to the cecum, which was identified by both the appendix and ileocecal valve , limited by No adverse events experienced.   The quality of the prep was poor. . The instrument was then slowly withdrawn as the colon was fully examined.     FINDINGS:  Rectal exam unremarkable.  Colonoscope was inserted into a poorly prepped colon and advanced to the splenic flexure where an anastomosis was noted with a large amount of solid stool in this area. No stricture was identified although views were limited due to poor prep. Due to excessive looping in this area, abdominal pressure had to be applied to advance proximal to the splenic flexure. The lumen appeared wide open proximal to the stool mass and no stricture was seen. It is possible that a stricture is present under this stool mass but the stool could not be dislodged despite repeated irrigation and use of a biopsy forceps to try and break up the stool. The pediatric colonoscope was then advanced to the cecum, where the appendiceal orifice and ileocecal valve were identified.    On careful withdrawal of the colonoscope the colocolonic anastomosis was again noted and there was no resistance to passage of an 11 mm diameter scope through this  area.  COMPLICATIONS: None  IMPRESSION:     1.Large amount of stool noted at splenic flexure but no stricture seen (poor prep may have prevented visualization); 2.Colocolonic anastomosis patent (see #1)  RECOMMENDATIONS: Miralax daily; Surveillance colonoscopy in 3 years as previously planned    ______________________________ eSigned:  Charlott Rakes, MD 10/18/2012 2:34 PM   [id number] Dwain Sarna, MD  PATIENT NAME:  Cindy, [name]. MR#: [id number]

## 2012-10-19 ENCOUNTER — Encounter (HOSPITAL_COMMUNITY): Payer: Self-pay | Admitting: Gastroenterology

## 2012-10-20 ENCOUNTER — Telehealth (INDEPENDENT_AMBULATORY_CARE_PROVIDER_SITE_OTHER): Payer: Self-pay | Admitting: General Surgery

## 2012-10-20 NOTE — Telephone Encounter (Signed)
Spoke with daughter and gave her appt time of 12/6 at 9:40.

## 2012-11-03 ENCOUNTER — Ambulatory Visit (INDEPENDENT_AMBULATORY_CARE_PROVIDER_SITE_OTHER): Payer: Medicare Other | Admitting: General Surgery

## 2012-11-03 ENCOUNTER — Encounter (INDEPENDENT_AMBULATORY_CARE_PROVIDER_SITE_OTHER): Payer: Self-pay | Admitting: General Surgery

## 2012-11-03 VITALS — BP 132/82 | HR 74 | Temp 97.8°F | Resp 18 | Ht 61.0 in | Wt 196.2 lb

## 2012-11-03 DIAGNOSIS — R1084 Generalized abdominal pain: Secondary | ICD-10-CM

## 2012-11-05 NOTE — Progress Notes (Signed)
Subjective:     Patient ID: [place], female   DOB: [birth year], 71 y.o.   MRN: [id number]  HPI 84 yof with history documented in previous notes.  She saw me for abdominal pain that is difficult to tell if is related to an incisional hernia or a colonic stricture at anastomosis.  Or this could be unrelated to both.  Her pain is actually considerably better today.  She continues to have bowel movements without difficulty and there is no change in caliber.  She underwent attempt at colonoscopy to see if this could be dilated or treated endoscopically but that is not possible.  I have discussed this with Dr. Bosie Clos.  She returns today with her daughter to discuss options.  Review of Systems     Objective:   Physical Exam unchanged    Assessment:     Incisional hernia Colonic anastomotic stricture    Plan:     She has multiple medical issues and is a frail lady.  I had a long discussion with she and her daughter today to discuss our options.  I am still not entirely sure what is causing her pain and I told her that surgery may not resolve any of her pain issues.  I am not entirely sure the colon is symptomatic although endoscopically and radiologically there is what appears to be narrowing of her anastomosis.  We discussed revising the anastomosis which would be colonic resection today.  I told her I would not be able to fix hernia in appropriate manner then and it would likely recur if repaired primarily.  We also discussed hernia repair.  After this discussion we have all agreed to follow her for now.  There is certainly a small risk she would need an urgent operation for one of these but I think the risk of this is significantly less than her operative risk.  They are both interested primarily in her quality of life right now and I think this approach is reasonable.  If there are changes then I would like to see her asap otherwise I will see and evaluated clinically in 3 months.

## 2012-11-06 ENCOUNTER — Encounter (INDEPENDENT_AMBULATORY_CARE_PROVIDER_SITE_OTHER): Payer: Medicare Other | Admitting: General Surgery

## 2012-12-29 ENCOUNTER — Encounter (INDEPENDENT_AMBULATORY_CARE_PROVIDER_SITE_OTHER): Payer: Self-pay

## 2013-01-08 ENCOUNTER — Encounter (INDEPENDENT_AMBULATORY_CARE_PROVIDER_SITE_OTHER): Payer: Self-pay | Admitting: General Surgery

## 2013-01-15 ENCOUNTER — Encounter (INDEPENDENT_AMBULATORY_CARE_PROVIDER_SITE_OTHER): Payer: Self-pay

## 2013-06-12 ENCOUNTER — Encounter (INDEPENDENT_AMBULATORY_CARE_PROVIDER_SITE_OTHER): Payer: Self-pay | Admitting: General Surgery

## 2013-06-12 ENCOUNTER — Ambulatory Visit (INDEPENDENT_AMBULATORY_CARE_PROVIDER_SITE_OTHER): Payer: Medicare Other | Admitting: General Surgery

## 2013-06-12 VITALS — BP 122/60 | HR 72 | Resp 16 | Ht 61.0 in | Wt 192.0 lb

## 2013-06-12 DIAGNOSIS — K432 Incisional hernia without obstruction or gangrene: Secondary | ICD-10-CM

## 2013-06-12 NOTE — Progress Notes (Signed)
Subjective:     Patient ID: Cindy [name], female   DOB: 1941/07/25, 72 y.o.   MRN: [id number]  HPI 89 yof with history documented in previous notes. She has significant surgical history as well as multiple cormorbidities.  Her daughter is not present for todays appointment.  She saw me for abdominal pain that is difficult to tell if is related to an incisional hernia or a colonic stricture at anastomosis. Or this could be unrelated to both.  She continues to have bowel movements without difficulty and there is no change in caliber. She underwent attempt at colonoscopy to see if this could be dilated or treated endoscopically but that is not possible. She does have a barium enema that shows a change in caliber but passes.  She returns today for follow up with no complaints. Specifically she is passing stool daily with no problem, she has no abdominal pain and she has no n/v  The symptoms she initially saw me for are all resolved right now.    Review of Systems     Objective:   Physical Exam Well healed midline incision with nontender incisional hernia    Assessment:     Incisional hernia ? Anastomotic stricture, partial     Plan:     She really has no symptoms today. I still think given her comorbidities as well as her extensive surgical history that the best plan is going to be observation. I've discussed this with she and her daughter on previous occasions. I discussed this with her again today. She is very agreeable to this. I think the risk of an operation is worse than the risk of her having problems with her incisional hernia or: Right now. I will plan on following her operative is needed. If she develops any gastrointestinal symptoms please send her back to see me.

## 2013-10-18 ENCOUNTER — Encounter: Payer: Self-pay | Admitting: Interventional Cardiology

## 2013-12-11 ENCOUNTER — Telehealth: Payer: Self-pay

## 2013-12-11 NOTE — Telephone Encounter (Signed)
Error

## 2014-01-17 ENCOUNTER — Encounter: Payer: Self-pay | Admitting: Cardiology

## 2014-01-17 ENCOUNTER — Encounter: Payer: Self-pay | Admitting: Interventional Cardiology

## 2014-01-17 ENCOUNTER — Ambulatory Visit (INDEPENDENT_AMBULATORY_CARE_PROVIDER_SITE_OTHER): Payer: Medicare Other | Admitting: Interventional Cardiology

## 2014-01-17 VITALS — BP 144/80 | HR 61 | Ht 61.0 in | Wt 190.0 lb

## 2014-01-17 DIAGNOSIS — I4891 Unspecified atrial fibrillation: Secondary | ICD-10-CM

## 2014-01-17 DIAGNOSIS — I2510 Atherosclerotic heart disease of native coronary artery without angina pectoris: Secondary | ICD-10-CM

## 2014-01-17 DIAGNOSIS — E669 Obesity, unspecified: Secondary | ICD-10-CM

## 2014-01-17 DIAGNOSIS — I10 Essential (primary) hypertension: Secondary | ICD-10-CM

## 2014-01-17 MED ORDER — LOSARTAN POTASSIUM 50 MG PO TABS: ORAL_TABLET | ORAL | Status: DC

## 2014-01-17 MED ORDER — NITROGLYCERIN 0.4 MG SL SUBL: 0.4000 mg | SUBLINGUAL_TABLET | SUBLINGUAL | Status: DC | PRN

## 2014-01-17 NOTE — Progress Notes (Signed)
Patient ID: Cindy [name], female   DOB: Oct 19, 1941, 73 y.o.   MRN: [id number]    [name], [name], [place]  [address] Phone: [phone] Fax:  [phone]  Date:  01/17/2014   ID:  [name], [name] [birth year], MRN [id number]  PCP:  [name], MD      History of Present Illness: Cindy [name] is a 73 y.o. female who has had moderate CAD. She has had GI issues with a hernia and a partial colon obstruction. No chest pain. Minimal walking. she has no procedures for her colon planned. Her Coumadin has been checked recently. She does minimal walking. She has some chronic shortness of breath with her activities. CAD/ASCVD:  Denies :  Diaphoresis.  Dizziness.  Dyspnea on exertion.  Leg edema.  Nitroglycerin.  Orthopnea.  Palpitations.   One episode of chest discomfort with walking.  She used a NTG and sx resolved after a minute with some rest.  She went back to her activities.    Wt Readings from Last 3 Encounters:  01/17/14 190 lb (86.183 kg)  06/12/13 192 lb (87.091 kg)  11/03/12 196 lb 3.2 oz (88.996 kg)     Past Medical History  Diagnosis Date  . Asthma   . Bronchitis   . Hypertension   . Recurrent upper respiratory infection (URI)   . GERD (gastroesophageal reflux disease)   . Depression   . Anxiety   . Dysrhythmia     atrial fibrillation  . Chest pain   . Cancer     uterian  . Barrett's esophagus   . Angina pectoris   . Coronary artery disease     right carotid occlusion   . Stroke     2007, after left endarterectomy  . Hyperlipidemia     Current Outpatient Prescriptions  Medication Sig Dispense Refill  . acetaminophen (TYLENOL) 325 MG tablet Take 650 mg by mouth every 4 (four) hours as needed. For pain      . acetaminophen (TYLENOL) 325 MG tablet Take 650 mg by mouth daily at 8 pm.      . albuterol (PROVENTIL HFA;VENTOLIN HFA) 108 (90 BASE) MCG/ACT inhaler Inhale 2 puffs into the lungs every 6 (six) hours as needed. For shortness of  breath      . alendronate (FOSAMAX) 70 MG tablet Take 70 mg by mouth every 7 (seven) days. Take with a full glass of water on an empty stomach.       Marland Kitchen aspirin 81 MG chewable tablet Chew 81 mg by mouth daily.        . calcium citrate-vitamin D 200-200 MG-UNIT TABS Take 1 tablet by mouth daily.        . clonazePAM (KLONOPIN) 0.5 MG tablet Take 0.5 mg by mouth 3 (three) times daily.        . ferrous sulfate 325 (65 FE) MG tablet Take 325 mg by mouth daily with breakfast.        . fluticasone (FLONASE) 50 MCG/ACT nasal spray Place 2 sprays into the nose daily.        Marland Kitchen guaiFENesin (ROBITUSSIN) 100 MG/5ML liquid Take 200 mg by mouth 3 (three) times daily as needed. For cough      . isosorbide mononitrate (IMDUR) 30 MG 24 hr tablet Take 30 mg by mouth daily.      Marland Kitchen losartan (COZAAR) 25 MG tablet Take 25 mg by mouth 2 (two) times daily.      Marland Kitchen  Melatonin 3 MG CAPS Take by mouth. Take 1 tab daily      . nitroGLYCERIN (NITROSTAT) 0.4 MG SL tablet Place 0.4 mg under the tongue every 5 (five) minutes as needed. For chest pain      . omeprazole (PRILOSEC) 20 MG capsule Take 40 mg by mouth daily.        . ondansetron (ZOFRAN-ODT) 8 MG disintegrating tablet       . polyethylene glycol powder (GLYCOLAX/MIRALAX) powder       . sertraline (ZOLOFT) 100 MG tablet Take 100 mg by mouth at bedtime.       . simvastatin (ZOCOR) 20 MG tablet Take 20 mg by mouth at bedtime.        . sodium chloride (OCEAN) 0.65 % SOLN nasal spray Place 2 sprays into the nose every 4 (four) hours. Scheduled while awake only      . tiotropium (SPIRIVA) 18 MCG inhalation capsule Place 18 mcg into inhaler and inhale daily.      . traZODone (DESYREL) 100 MG tablet Take 100 mg by mouth 2 (two) times daily.        Marland Kitchen triamcinolone cream (KENALOG) 0.1 % Apply 1 application topically 2 (two) times daily. Apply to affected area      . warfarin (COUMADIN) 4 MG tablet Take 4 mg by mouth daily.        No current facility-administered medications for  this visit.    Allergies:   No Known Allergies  Social History:  The patient  reports that she quit smoking about 10 years ago. She has never used smokeless tobacco. She reports that she does not drink alcohol or use illicit drugs.   Family History:  The patient's family history includes Cancer in her sister.   ROS:  Please see the history of present illness.  No nausea, vomiting.  No fevers, chills.  No focal weakness.  No dysuria.    All other systems reviewed and negative.   PHYSICAL EXAM: VS:  BP 144/80  Pulse 61  Ht 5\' 1"  (1.549 m)  Wt 190 lb (86.183 kg)  BMI 35.92 kg/m2 Well nourished, well developed, in no acute distress HEENT: normal Neck: no JVD, no carotid bruits Cardiac:  normal S1, S2; RRR;  Lungs:  clear to auscultation bilaterally, no wheezing, rhonchi or rales Abd: soft, nontender, no hepatomegaly Ext: no edema Skin: warm and dry Neuro:   no focal abnormalities noted     ASSESSMENT AND PLAN:  CAD in native artery  Continue Nitroglycerin 0.4 mg tablet, 0.4 mg, 1 tablet as directed, SL, as directed prn chest pain Continue Simvastatin Tablet, 20 MG, 1 tablet every evening, Orally, Once a day Notes: Moderate left main disease in the past. she did have a mildly abnormal stress test in the past. She had a catheterization done in [place] several years ago. With blood pressure control,her chest discomfort improved. Only one episode.  We discussed further workup, but she would like to hold off since it was only one time.  She will let us know if any more episodes occur.  we'll continue imdur at current dose.  Would not recommend cardiac cath at this time.    2. Essential hypertension, benign  Change Losartan Potassium Tablet to 50 MG,  tablet, Orally, once daily at night Notes: Controlled for the most part. she had some spikes in her blood pressure at the nursing home. If her blood pressure does continue to spike, we could add low-dose amlodipine. She also  had some low  readings early in the morning, an hour after the a.m. losartan dose. Will move the losartan to the evening.    3. Obesity, unspecified  Notes: SHe needs to try to walk more and increase exercise. diet control also be important. More walking with walker. she has lost 30 pounds overall.  Maintaining weight loss.    4. Atrial fibrillation  Continue Warfarin Sodium Tablet, 5mg /4mg , 1 5mg  tab MWF and 4mg  all other days, Orally, as directed Notes: In NSR. Coumadin for stroke prevention.    5. Prior CVA at the time of CEA in 2006.   Follow Up     Signed, Mina Marble, MD, [facility] 01/17/2014 4:19 PM

## 2014-01-17 NOTE — Patient Instructions (Signed)
Your physician has recommended you make the following change in your medication:   1. Change from taking Losartan 25 mg twice a day to taking 50 mg at night.   Your physician wants you to follow-up in: 7 months with Dr. Irish Lack. You will receive a reminder letter in the mail two months in advance. If you don't receive a letter, please call our office to schedule the follow-up appointment.

## 2014-01-21 DIAGNOSIS — I2510 Atherosclerotic heart disease of native coronary artery without angina pectoris: Secondary | ICD-10-CM | POA: Insufficient documentation

## 2014-01-31 ENCOUNTER — Ambulatory Visit: Payer: Medicare Other | Admitting: Interventional Cardiology

## 2014-02-01 ENCOUNTER — Ambulatory Visit: Payer: Medicare Other | Admitting: Interventional Cardiology

## 2014-03-19 ENCOUNTER — Encounter: Payer: Self-pay | Admitting: Interventional Cardiology

## 2014-06-10 ENCOUNTER — Emergency Department (HOSPITAL_COMMUNITY): Payer: Medicare Other

## 2014-06-10 ENCOUNTER — Emergency Department (HOSPITAL_COMMUNITY)
Admission: EM | Admit: 2014-06-10 | Discharge: 2014-06-10 | Disposition: A | Discharge status: Home / Self Care | Payer: Medicare Other | Attending: Emergency Medicine | Admitting: Emergency Medicine

## 2014-06-10 ENCOUNTER — Encounter (HOSPITAL_COMMUNITY): Payer: Self-pay | Admitting: Emergency Medicine

## 2014-06-10 DIAGNOSIS — F411 Generalized anxiety disorder: Secondary | ICD-10-CM | POA: Insufficient documentation

## 2014-06-10 DIAGNOSIS — Y9301 Activity, walking, marching and hiking: Secondary | ICD-10-CM | POA: Insufficient documentation

## 2014-06-10 DIAGNOSIS — Z8673 Personal history of transient ischemic attack (TIA), and cerebral infarction without residual deficits: Secondary | ICD-10-CM | POA: Diagnosis not present

## 2014-06-10 DIAGNOSIS — W19XXXA Unspecified fall, initial encounter: Secondary | ICD-10-CM

## 2014-06-10 DIAGNOSIS — F3289 Other specified depressive episodes: Secondary | ICD-10-CM | POA: Diagnosis not present

## 2014-06-10 DIAGNOSIS — G319 Degenerative disease of nervous system, unspecified: Secondary | ICD-10-CM | POA: Insufficient documentation

## 2014-06-10 DIAGNOSIS — IMO0002 Reserved for concepts with insufficient information to code with codable children: Secondary | ICD-10-CM | POA: Diagnosis not present

## 2014-06-10 DIAGNOSIS — E785 Hyperlipidemia, unspecified: Secondary | ICD-10-CM | POA: Diagnosis not present

## 2014-06-10 DIAGNOSIS — Y9210 Unspecified residential institution as the place of occurrence of the external cause: Secondary | ICD-10-CM | POA: Insufficient documentation

## 2014-06-10 DIAGNOSIS — Z7982 Long term (current) use of aspirin: Secondary | ICD-10-CM | POA: Insufficient documentation

## 2014-06-10 DIAGNOSIS — Z9889 Other specified postprocedural states: Secondary | ICD-10-CM | POA: Diagnosis not present

## 2014-06-10 DIAGNOSIS — K219 Gastro-esophageal reflux disease without esophagitis: Secondary | ICD-10-CM | POA: Diagnosis not present

## 2014-06-10 DIAGNOSIS — I2510 Atherosclerotic heart disease of native coronary artery without angina pectoris: Secondary | ICD-10-CM | POA: Diagnosis not present

## 2014-06-10 DIAGNOSIS — W1809XA Striking against other object with subsequent fall, initial encounter: Secondary | ICD-10-CM | POA: Diagnosis not present

## 2014-06-10 DIAGNOSIS — J45909 Unspecified asthma, uncomplicated: Secondary | ICD-10-CM | POA: Diagnosis not present

## 2014-06-10 DIAGNOSIS — Z8542 Personal history of malignant neoplasm of other parts of uterus: Secondary | ICD-10-CM | POA: Diagnosis not present

## 2014-06-10 DIAGNOSIS — Z87891 Personal history of nicotine dependence: Secondary | ICD-10-CM | POA: Diagnosis not present

## 2014-06-10 DIAGNOSIS — I10 Essential (primary) hypertension: Secondary | ICD-10-CM | POA: Insufficient documentation

## 2014-06-10 DIAGNOSIS — Z7901 Long term (current) use of anticoagulants: Secondary | ICD-10-CM | POA: Diagnosis not present

## 2014-06-10 DIAGNOSIS — Z043 Encounter for examination and observation following other accident: Secondary | ICD-10-CM | POA: Insufficient documentation

## 2014-06-10 DIAGNOSIS — F329 Major depressive disorder, single episode, unspecified: Secondary | ICD-10-CM | POA: Insufficient documentation

## 2014-06-10 DIAGNOSIS — Z79899 Other long term (current) drug therapy: Secondary | ICD-10-CM | POA: Insufficient documentation

## 2014-06-10 LAB — COMPREHENSIVE METABOLIC PANEL
ALK PHOS: 53 U/L (ref 39–117)
ALT: 14 U/L (ref 0–35)
ANION GAP: 10 (ref 5–15)
AST: 16 U/L (ref 0–37)
Albumin: 3.6 g/dL (ref 3.5–5.2)
BILIRUBIN TOTAL: 0.3 mg/dL (ref 0.3–1.2)
BUN: 12 mg/dL (ref 6–23)
CHLORIDE: 103 meq/L (ref 96–112)
CO2: 27 mEq/L (ref 19–32)
CREATININE: 0.95 mg/dL (ref 0.50–1.10)
Calcium: 9.4 mg/dL (ref 8.4–10.5)
GFR calc non Af Amer: 58 mL/min — ABNORMAL LOW (ref >90)
GFR, EST AFRICAN AMERICAN: 67 mL/min — AB (ref >90)
GLUCOSE: 96 mg/dL (ref 70–99)
POTASSIUM: 4.3 meq/L (ref 3.7–5.3)
Sodium: 140 mEq/L (ref 137–147)
TOTAL PROTEIN: 6.2 g/dL (ref 6.0–8.3)

## 2014-06-10 LAB — URINALYSIS, ROUTINE W REFLEX MICROSCOPIC
Bilirubin Urine: NEGATIVE
GLUCOSE, UA: NEGATIVE mg/dL
Ketones, ur: NEGATIVE mg/dL
LEUKOCYTES UA: NEGATIVE
Nitrite: NEGATIVE
PH: 6 (ref 5.0–8.0)
Protein, ur: NEGATIVE mg/dL
Specific Gravity, Urine: 1.008 (ref 1.005–1.030)
Urobilinogen, UA: 1 mg/dL (ref 0.0–1.0)

## 2014-06-10 LAB — URINE MICROSCOPIC-ADD ON

## 2014-06-10 LAB — CBC WITH DIFFERENTIAL/PLATELET
Basophils Absolute: 0 10*3/uL (ref 0.0–0.1)
Basophils Relative: 1 % (ref 0–1)
EOS ABS: 0.2 10*3/uL (ref 0.0–0.7)
Eosinophils Relative: 4 % (ref 0–5)
HEMATOCRIT: 38.7 % (ref 36.0–46.0)
HEMOGLOBIN: 12.5 g/dL (ref 12.0–15.0)
LYMPHS ABS: 1 10*3/uL (ref 0.7–4.0)
Lymphocytes Relative: 18 % (ref 12–46)
MCH: 28 pg (ref 26.0–34.0)
MCHC: 32.3 g/dL (ref 30.0–36.0)
MCV: 86.8 fL (ref 78.0–100.0)
MONO ABS: 0.4 10*3/uL (ref 0.1–1.0)
MONOS PCT: 8 % (ref 3–12)
NEUTROS PCT: 69 % (ref 43–77)
Neutro Abs: 3.9 10*3/uL (ref 1.7–7.7)
Platelets: 162 10*3/uL (ref 150–400)
RBC: 4.46 MIL/uL (ref 3.87–5.11)
RDW: 13.4 % (ref 11.5–15.5)
WBC: 5.6 10*3/uL (ref 4.0–10.5)

## 2014-06-10 LAB — PROTIME-INR
INR: 2.46 — AB (ref 0.00–1.49)
Prothrombin Time: 26.7 seconds — ABNORMAL HIGH (ref 11.6–15.2)

## 2014-06-10 LAB — TROPONIN I

## 2014-06-10 NOTE — ED Notes (Signed)
Rn called daughter per patients request to update on discharge and to facilitate ride back home

## 2014-06-10 NOTE — ED Notes (Signed)
Per EMS, Patient is coming from [facility] in [place]. Patient started to fall when she lost her balance while walking and a staff member broke her fall and helped her to the floor. When patient hit the ground, she fell back and hit her head on laminate floor. When initially picked up from the fall, patient complained of lightheadness, but no longer complains of this symptom. EMS found no signs of injury. Patient is on Coumadin. Vitals per EMS: 160/88, 88 HR, 16 RR, 111 CBG.

## 2014-06-10 NOTE — ED Notes (Signed)
Pt at CT

## 2014-06-10 NOTE — ED Provider Notes (Signed)
CSN: [id number]     Arrival date & time 06/10/14  1347 History   First MD Initiated Contact with Patient 06/10/14 1354     Chief Complaint  Patient presents with  . Fall     (Consider location/radiation/quality/duration/timing/severity/associated sxs/prior Treatment) Patient is a 73 y.o. female presenting with fall. The history is provided by the patient.  Fall This is a new problem. The current episode started 1 to 2 hours ago. Episode frequency: once. The problem has been resolved. Pertinent negatives include no chest pain, no abdominal pain, no headaches and no shortness of breath. Nothing aggravates the symptoms. Nothing relieves the symptoms. She has tried nothing for the symptoms. The treatment provided significant relief.    Past Medical History  Diagnosis Date  . Asthma   . Bronchitis   . Hypertension   . Recurrent upper respiratory infection (URI)   . GERD (gastroesophageal reflux disease)   . Depression   . Anxiety   . Dysrhythmia     atrial fibrillation  . Chest pain   . Cancer     uterian  . Barrett's esophagus   . Angina pectoris   . Coronary artery disease     right carotid occlusion   . Stroke     2007, after left endarterectomy  . Hyperlipidemia    Past Surgical History  Procedure Laterality Date  . Abdominal hysterectomy    . 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 Esophagogastroduodenoscopy  12/01/2011    Procedure: ESOPHAGOGASTRODUODENOSCOPY (EGD);  Surgeon: Lear Ng, MD;  Location: [facility];  Service: Endoscopy;  Laterality: N/A;  . Colonoscopy  12/01/2011    Procedure: COLONOSCOPY;  Surgeon: Lear Ng, MD;  Location: [facility];  Service: Endoscopy;  Laterality: N/A;  . Hot hemostasis  12/01/2011    Procedure: HOT HEMOSTASIS (ARGON PLASMA COAGULATION/BICAP);  Surgeon: Lear Ng, MD;  Location: [facility];  Service: Endoscopy;  Laterality: N/A;  . Carotid endarterectomy       left, complicated by stroke  . Cholecystectomy      incidental at time of colectomy  . Colonoscopy  10/18/2012    Procedure: COLONOSCOPY;  Surgeon: Lear Ng, MD;  Location: [facility];  Service: Endoscopy;  Laterality: N/A;  colonic dilation  . Balloon dilation  10/18/2012    Procedure: BALLOON DILATION;  Surgeon: Lear Ng, MD;  Location: [facility];  Service: Endoscopy;  Laterality: N/A;  . Cardiac catheterization     Family History  Problem Relation Age of Onset  . Cancer Sister     ovarian   History  Substance Use Topics  . Smoking status: Former Smoker    Quit date: 04/09/2003  . Smokeless tobacco: Never Used  . Alcohol Use: No   OB History   Grav Para Term Preterm Abortions TAB SAB Ect Mult Living                 Review of Systems  Constitutional: Negative for fever and fatigue.  HENT: Negative for congestion and drooling.   Eyes: Negative for pain.  Respiratory: Negative for cough and shortness of breath.   Cardiovascular: Negative for chest pain.  Gastrointestinal: Negative for nausea, vomiting, abdominal pain and diarrhea.  Genitourinary: Negative for dysuria and hematuria.  Musculoskeletal: Negative for back pain, gait problem and neck pain.  Skin: Negative for color change.  Neurological: Negative for dizziness and headaches.  Hematological: Negative for adenopathy.  Psychiatric/Behavioral:  Negative for behavioral problems.  All other systems reviewed and are negative.     Allergies  Review of patient's allergies indicates no known allergies.  Home Medications   Prior to Admission medications   Medication Sig Start Date End Date Taking? Authorizing Provider  acetaminophen (TYLENOL) 325 MG tablet Take 650 mg by mouth every evening. For pain   Yes Historical Provider, MD  albuterol (PROVENTIL HFA;VENTOLIN HFA) 108 (90 BASE) MCG/ACT inhaler Inhale 2 puffs into the lungs every 6 (six) hours. For shortness of breath   Yes  Historical Provider, MD  alendronate (FOSAMAX) 70 MG tablet Take 70 mg by mouth every Monday. Take with a full glass of water on an empty stomach.   Yes Historical Provider, MD  aspirin 81 MG chewable tablet Chew 81 mg by mouth daily.    Yes Historical Provider, MD  calcium citrate-vitamin D 200-200 MG-UNIT TABS Take 1 tablet by mouth daily.    Yes Historical Provider, MD  clonazePAM (KLONOPIN) 0.5 MG tablet Take 0.5 mg by mouth 3 (three) times daily.    Yes Historical Provider, MD  ferrous sulfate 325 (65 FE) MG tablet Take 325 mg by mouth daily with breakfast.    Yes Historical Provider, MD  fluticasone (FLONASE) 50 MCG/ACT nasal spray Place 2 sprays into the nose daily.    Yes Historical Provider, MD  guaiFENesin (ROBITUSSIN) 100 MG/5ML liquid Take 200 mg by mouth every 6 (six) hours as needed for cough. For cough   Yes Historical Provider, MD  isosorbide mononitrate (IMDUR) 30 MG 24 hr tablet Take 30 mg by mouth daily.   Yes Historical Provider, MD  loratadine (CLARITIN) 10 MG tablet Take 10 mg by mouth daily.   Yes Historical Provider, MD  losartan (COZAAR) 50 MG tablet Take 50 mg by mouth every evening.   Yes Historical Provider, MD  Melatonin 3 MG CAPS Take 3 mg by mouth at bedtime.    Yes Historical Provider, MD  Mineral Oil Heavy OIL Place 1 drop into the right ear every Friday.   Yes Historical Provider, MD  omeprazole (PRILOSEC) 20 MG capsule Take 40 mg by mouth daily.    Yes Historical Provider, MD  phenol (QC SORE THROAT SPRAY) 1.4 % LIQD Use as directed 1 spray in the mouth or throat every 2 (two) hours as needed for throat irritation / pain.   Yes Historical Provider, MD  sertraline (ZOLOFT) 100 MG tablet Take 150 mg by mouth every morning.    Yes Historical Provider, MD  simvastatin (ZOCOR) 20 MG tablet Take 20 mg by mouth at bedtime.    Yes Historical Provider, MD  sodium chloride (OCEAN) 0.65 % SOLN nasal spray Place 2 sprays into the nose every 4 (four) hours. Scheduled while  awake only   Yes Historical Provider, MD  tiotropium (SPIRIVA) 18 MCG inhalation capsule Place 18 mcg into inhaler and inhale daily.   Yes Historical Provider, MD  traZODone (DESYREL) 100 MG tablet Take 200 mg by mouth at bedtime.    Yes Historical Provider, MD  warfarin (COUMADIN) 4 MG tablet Take 4 mg by mouth daily.    Yes Historical Provider, MD  nitroGLYCERIN (NITROSTAT) 0.4 MG SL tablet Place 0.4 mg under the tongue every 5 (five) minutes as needed for chest pain. For chest pain 01/17/14   Jettie Booze, MD   BP 159/88  Temp(Src) 98.8 F (37.1 C) (Oral)  Resp 15  SpO2 99% Physical Exam  Nursing note and vitals reviewed. Constitutional:  She is oriented to person, place, and time. She appears well-developed and well-nourished.  HENT:  Head: Normocephalic and atraumatic.  Mouth/Throat: Oropharynx is clear and moist. No oropharyngeal exudate.  Eyes: Conjunctivae and EOM are normal. Pupils are equal, round, and reactive to light.  Neck: Normal range of motion. Neck supple.  Mild non-spec ttp of c-spine, no other vertebral ttp.   Cardiovascular: Normal rate, regular rhythm, normal heart sounds and intact distal pulses.  Exam reveals no gallop and no friction rub.   No murmur heard. Pulmonary/Chest: Effort normal and breath sounds normal. No respiratory distress. She has no wheezes.  Abdominal: Soft. Bowel sounds are normal. There is no tenderness. There is no rebound and no guarding.  Musculoskeletal: Normal range of motion. She exhibits no edema and no tenderness.  Neurological: She is alert and oriented to person, place, and time.  alert, oriented x3 speech: normal in context and clarity memory: intact grossly cranial nerves II-XII: intact motor strength: full proximally and distally no involuntary movements or tremors sensation: intact to light touch diffusely  cerebellar: finger-to-nose intact gait: normal forwards and backwards w/ mild assistance, pt normally ambulates w/  a walker   Skin: Skin is warm and dry.  Psychiatric: She has a normal mood and affect. Her behavior is normal.    ED Course  Procedures (including critical care time) Labs Review Labs Reviewed  COMPREHENSIVE METABOLIC PANEL - Abnormal; Notable for the following:    GFR calc non Af Amer 58 (*)    GFR calc Af Amer 67 (*)    All other components within normal limits  URINALYSIS, ROUTINE W REFLEX MICROSCOPIC - Abnormal; Notable for the following:    APPearance CLOUDY (*)    Hgb urine dipstick TRACE (*)    All other components within normal limits  PROTIME-INR - Abnormal; Notable for the following:    Prothrombin Time 26.7 (*)    INR 2.46 (*)    All other components within normal limits  URINE MICROSCOPIC-ADD ON - Abnormal; Notable for the following:    Bacteria, UA FEW (*)    All other components within normal limits  URINE CULTURE  CBC WITH DIFFERENTIAL  TROPONIN I    Imaging Review Dg Chest 2 View  06/10/2014   CLINICAL DATA:  Fall.  EXAM: CHEST  2 VIEW  COMPARISON:  January 07, 2012.  FINDINGS: The heart size and mediastinal contours are within normal limits. Both lungs are clear. No pneumothorax or pleural effusion is noted. Anterior osteophyte formation is seen involving the lower thoracic spine.  IMPRESSION: No acute cardiopulmonary abnormality seen.   Electronically Signed   By: Sabino Dick M.D.   On: 06/10/2014 14:49   Ct Head Wo Contrast  06/10/2014   CLINICAL DATA:  Head trauma on anticoagulant ste  EXAM: CT HEAD WITHOUT CONTRAST  CT CERVICAL SPINE WITHOUT CONTRAST  TECHNIQUE: Multidetector CT imaging of the head and cervical spine was performed following the standard protocol without intravenous contrast. Multiplanar CT image reconstructions of the cervical spine were also generated.  COMPARISON:  Noncontrast CT scan of the brain dated Apr 08, 2012  FINDINGS: CT HEAD FINDINGS  There is mild diffuse cerebral and cerebellar atrophy with compensatory ventriculomegaly. There is  stable encephalomalacia in the left frontal and right posterior parietal lobes. There is no acute intracranial hemorrhage nor acute ischemic change. There is stable decreased density in the deep white matter both cerebral hemispheres consistent with chronic small vessel ischemia. There are stable basal ganglia  calcifications bilaterally. The cerebellum and brainstem are normal.  The visualized paranasal sinuses and mastoid air cells are clear. There is no skull fracture.  CT CERVICAL SPINE FINDINGS  The cervical vertebral bodies are preserved in height. There is loss of the normal cervical lordosis. There is mild disc space narrowing at C5-6 and at C6-7. There is no perched facet nor spinous process fracture. The prevertebral soft tissue spaces are normal. The odontoid is intact. The pulmonary apices are clear.  IMPRESSION: 1. There is no acute intracranial abnormality nor other acute abnormality. There is significant chronic change consistent with previous CVA and chronic small-vessel ischemia 2. There is no acute skull fracture. 3. There is no acute cervical spine fracture nor dislocation. There is degenerative disc change at C5-6 and at C6-7.   Electronically Signed   By: David  Martinique   On: 06/10/2014 15:12   Ct Cervical Spine Wo Contrast  06/10/2014   CLINICAL DATA:  Head trauma on anticoagulant ste  EXAM: CT HEAD WITHOUT CONTRAST  CT CERVICAL SPINE WITHOUT CONTRAST  TECHNIQUE: Multidetector CT imaging of the head and cervical spine was performed following the standard protocol without intravenous contrast. Multiplanar CT image reconstructions of the cervical spine were also generated.  COMPARISON:  Noncontrast CT scan of the brain dated Apr 08, 2012  FINDINGS: CT HEAD FINDINGS  There is mild diffuse cerebral and cerebellar atrophy with compensatory ventriculomegaly. There is stable encephalomalacia in the left frontal and right posterior parietal lobes. There is no acute intracranial hemorrhage nor acute  ischemic change. There is stable decreased density in the deep white matter both cerebral hemispheres consistent with chronic small vessel ischemia. There are stable basal ganglia calcifications bilaterally. The cerebellum and brainstem are normal.  The visualized paranasal sinuses and mastoid air cells are clear. There is no skull fracture.  CT CERVICAL SPINE FINDINGS  The cervical vertebral bodies are preserved in height. There is loss of the normal cervical lordosis. There is mild disc space narrowing at C5-6 and at C6-7. There is no perched facet nor spinous process fracture. The prevertebral soft tissue spaces are normal. The odontoid is intact. The pulmonary apices are clear.  IMPRESSION: 1. There is no acute intracranial abnormality nor other acute abnormality. There is significant chronic change consistent with previous CVA and chronic small-vessel ischemia 2. There is no acute skull fracture. 3. There is no acute cervical spine fracture nor dislocation. There is degenerative disc change at C5-6 and at C6-7.   Electronically Signed   By: David  Martinique   On: 06/10/2014 15:12     EKG Interpretation   Date/Time:  Monday June 10 2014 14:06:17 EDT Ventricular Rate:  67 PR Interval:  201 QRS Duration: 96 QT Interval:  419 QTC Calculation: 442 R Axis:   -9 Text Interpretation:  Sinus rhythm LVH with secondary repolarization  abnormality No significant change since last tracing Confirmed by Bronwyn Belasco   MD, Lorra Freeman ([id number]) on 06/10/2014 2:28:40 PM      MDM   Final diagnoses:  Fall, initial encounter    2:20 PM 73 y.o. female w hx of CVA, CAD on coumadin who presents with a fall which occurred prior to arrival. The patient is unsure why she fell. She states that she had just gotten up and was ambulating with her med tech when she fell backwards. The med tech reportedly helped her down to the floor. The patient did hit the occipital area of her head on the ground. She denies loss of  consciousness.  She is afebrile and vital signs are unremarkable here. She denies any pain on exam. Will get screening labs and imaging.  3:50 PM: I interpreted/reviewed the labs and/or imaging which were non-contributory.  Pt remains asx. Will d/c back to facility.  I have discussed the diagnosis/risks/treatment options with the patient and believe the pt to be eligible for discharge home to follow-up with pcp as needed. We also discussed returning to the ED immediately if new or worsening sx occur. We discussed the sx which are most concerning (e.g., HA, AMS) that necessitate immediate return. Medications administered to the patient during their visit and any new prescriptions provided to the patient are listed below.  Medications given during this visit Medications - No data to display  New Prescriptions   No medications on file     Blanchard Kelch, MD 06/10/14 1559

## 2014-06-11 LAB — URINE CULTURE: Colony Count: 15000

## 2014-06-21 ENCOUNTER — Encounter: Payer: Self-pay | Admitting: *Deleted

## 2014-07-25 ENCOUNTER — Ambulatory Visit (HOSPITAL_COMMUNITY): Payer: Medicare Other | Attending: Interventional Cardiology | Admitting: Cardiology

## 2014-07-25 ENCOUNTER — Encounter: Payer: Self-pay | Admitting: Interventional Cardiology

## 2014-07-25 ENCOUNTER — Ambulatory Visit (INDEPENDENT_AMBULATORY_CARE_PROVIDER_SITE_OTHER): Payer: Medicare Other | Admitting: Interventional Cardiology

## 2014-07-25 VITALS — BP 120/58 | HR 60 | Ht 63.0 in | Wt 188.0 lb

## 2014-07-25 DIAGNOSIS — I6529 Occlusion and stenosis of unspecified carotid artery: Secondary | ICD-10-CM | POA: Insufficient documentation

## 2014-07-25 DIAGNOSIS — E669 Obesity, unspecified: Secondary | ICD-10-CM | POA: Insufficient documentation

## 2014-07-25 DIAGNOSIS — I4891 Unspecified atrial fibrillation: Secondary | ICD-10-CM | POA: Insufficient documentation

## 2014-07-25 DIAGNOSIS — I639 Cerebral infarction, unspecified: Secondary | ICD-10-CM

## 2014-07-25 DIAGNOSIS — I10 Essential (primary) hypertension: Secondary | ICD-10-CM | POA: Diagnosis not present

## 2014-07-25 DIAGNOSIS — Z7901 Long term (current) use of anticoagulants: Secondary | ICD-10-CM | POA: Insufficient documentation

## 2014-07-25 DIAGNOSIS — I2510 Atherosclerotic heart disease of native coronary artery without angina pectoris: Secondary | ICD-10-CM

## 2014-07-25 DIAGNOSIS — I6350 Cerebral infarction due to unspecified occlusion or stenosis of unspecified cerebral artery: Secondary | ICD-10-CM

## 2014-07-25 DIAGNOSIS — Z7982 Long term (current) use of aspirin: Secondary | ICD-10-CM | POA: Insufficient documentation

## 2014-07-25 DIAGNOSIS — I480 Paroxysmal atrial fibrillation: Secondary | ICD-10-CM

## 2014-07-25 DIAGNOSIS — Z8673 Personal history of transient ischemic attack (TIA), and cerebral infarction without residual deficits: Secondary | ICD-10-CM | POA: Diagnosis not present

## 2014-07-25 NOTE — Progress Notes (Signed)
Patient ID: AVIN UPPERMAN, female   DOB: [birth year], 73 y.o.   MRN: [id number] Patient ID: NISHITA ISAACKS, female   DOB: [birth year], 73 y.o.   MRN: [id number]    [address], [place], [place]  [address] Phone: [phone] Fax:  [phone]  Date:  07/25/2014   ID:  Aubreyanna, Dorrough 11-11-1941, MRN [id number]  PCP:  [name], MD      History of Present Illness: LAURINA FISCHL is a 73 y.o. female who has had moderate CAD. She has had GI issues with a hernia and a partial colon obstruction. No chest pain. Minimal walking. she has no procedures for her colon planned. Her Coumadin has been checked recently. She does minimal walking. She has some chronic shortness of breath with her activities. CAD/ASCVD:  Denies :  Diaphoresis.  Dizziness.  Dyspnea on exertion.  Leg edema.  Nitroglycerin.  Orthopnea.  Palpitations.   No further chest discomfort or NTG use.    Wt Readings from Last 3 Encounters:  07/25/14 188 lb (85.276 kg)  01/17/14 190 lb (86.183 kg)  06/12/13 192 lb (87.091 kg)     Past Medical History  Diagnosis Date  . Asthma   . Bronchitis   . Hypertension   . Recurrent upper respiratory infection (URI)   . GERD (gastroesophageal reflux disease)   . Depression   . Anxiety   . Dysrhythmia     atrial fibrillation  . Chest pain   . Cancer     uterian  . Barrett's esophagus   . Angina pectoris   . Coronary artery disease     right carotid occlusion   . Stroke     2007, after left endarterectomy  . Hyperlipidemia     Current Outpatient Prescriptions  Medication Sig Dispense Refill  . acetaminophen (TYLENOL) 325 MG tablet Take 650 mg by mouth every evening. For pain      . albuterol (PROVENTIL HFA;VENTOLIN HFA) 108 (90 BASE) MCG/ACT inhaler Inhale 2 puffs into the lungs every 6 (six) hours. For shortness of breath      . alendronate (FOSAMAX) 70 MG tablet Take 70 mg by mouth every Monday. Take with a full glass of water on an empty stomach.       Marland Kitchen aspirin 81 MG chewable tablet Chew 81 mg by mouth daily.       . calcium citrate-vitamin D 200-200 MG-UNIT TABS Take 1 tablet by mouth daily.       . clonazePAM (KLONOPIN) 0.5 MG tablet Take 0.5 mg by mouth 3 (three) times daily.       . ferrous sulfate 325 (65 FE) MG tablet Take 325 mg by mouth daily with breakfast.       . fluticasone (FLONASE) 50 MCG/ACT nasal spray Place 2 sprays into the nose daily.       Marland Kitchen guaiFENesin (ROBITUSSIN) 100 MG/5ML liquid Take 200 mg by mouth every 6 (six) hours as needed for cough. For cough      . isosorbide mononitrate (IMDUR) 30 MG 24 hr tablet Take 30 mg by mouth daily.      Marland Kitchen loratadine (CLARITIN) 10 MG tablet Take 10 mg by mouth daily.      Marland Kitchen losartan (COZAAR) 50 MG tablet Take 50 mg by mouth every evening.      . Melatonin 3 MG CAPS Take 3 mg by mouth at bedtime.       Verdell Face Oil Heavy OIL Place 1  drop into the right ear every Friday.      . nitroGLYCERIN (NITROSTAT) 0.4 MG SL tablet Place 0.4 mg under the tongue every 5 (five) minutes as needed for chest pain. For chest pain      . omeprazole (PRILOSEC) 20 MG capsule Take 40 mg by mouth daily.       . phenol (QC SORE THROAT SPRAY) 1.4 % LIQD Use as directed 1 spray in the mouth or throat every 2 (two) hours as needed for throat irritation / pain.      Marland Kitchen sertraline (ZOLOFT) 100 MG tablet Take 200 mg by mouth daily.       . simvastatin (ZOCOR) 20 MG tablet Take 20 mg by mouth at bedtime.       . sodium chloride (OCEAN) 0.65 % SOLN nasal spray Place 2 sprays into the nose every 4 (four) hours. Scheduled while awake only      . tiotropium (SPIRIVA) 18 MCG inhalation capsule Place 18 mcg into inhaler and inhale daily.      . traZODone (DESYREL) 100 MG tablet Take 200 mg by mouth at bedtime.       Marland Kitchen warfarin (COUMADIN) 4 MG tablet Take 4 mg by mouth daily.        No current facility-administered medications for this visit.    Allergies:   No Known Allergies  Social History:  The patient  reports  that she quit smoking about 11 years ago. She has never used smokeless tobacco. She reports that she does not drink alcohol or use illicit drugs.   Family History:  The patient's family history includes Cancer in her sister.   ROS:  Please see the history of present illness.  No nausea, vomiting.  No fevers, chills.  No focal weakness.  No dysuria.    All other systems reviewed and negative.   PHYSICAL EXAM: VS:  BP 120/58  Pulse 60  Ht 5\' 3"  (1.6 m)  Wt 188 lb (85.276 kg)  BMI 33.31 kg/m2 Well nourished, well developed, in no acute distress HEENT: normal Neck: no JVD, no carotid bruits Cardiac:  normal S1, S2; RRR;  Lungs:  clear to auscultation bilaterally, no wheezing, rhonchi or rales Abd: soft, nontender, no hepatomegaly Ext: no edema Skin: warm and dry Neuro:   no focal abnormalities noted     ASSESSMENT AND PLAN:  CAD in native artery  Continue Nitroglycerin 0.4 mg tablet, 0.4 mg, 1 tablet as directed, SL, as directed prn chest pain Continue Simvastatin Tablet, 20 MG, 1 tablet every evening, Orally, Once a day Notes: Moderate left main disease in the past. she did have a mildly abnormal stress test in the past. She had a catheterization done in [place] several years ago. With blood pressure control,her chest discomfort improved. No more episodes.  We discussed further workup, but she would like to hold off since it was only one time.  She will let us know if any more episodes occur.  we'll continue imdur at current dose.  Would not recommend cardiac cath at this time.    2. Essential hypertension, benign  Change Losartan Potassium Tablet to 50 MG,  tablet, Orally, once daily at night Notes: Controlled for the most part. No more spikes in her blood pressure at the nursing home. If her blood pressure does continue to spike, we could add low-dose amlodipine. She also had some low readings early in the morning, an hour after the a.m. losartan dose. We moved the losartan to the  evening last visit.    3. Obesity, unspecified  Notes: SHe needs to try to walk more and increase exercise. diet control also be important. More walking with walker. she has lost 30 pounds overall.  Maintaining weight loss.    4. Atrial fibrillation  Continue Warfarin Sodium Tablet, 5mg /4mg , 1 5mg  tab MWF and 4mg  all other days, Orally, as directed Notes: In NSR. Coumadin for stroke prevention. One fall not related to syncope.      5. Prior CVA at the time of CEA in 2007.  Known right carotid oclusion.  Check Carotid Dopplers.  Follow Up 6 months     Signed, Mina Marble, MD, [facility] 07/25/2014 2:30 PM

## 2014-07-25 NOTE — Patient Instructions (Signed)
Your physician has requested that you have a carotid duplex. This test is an ultrasound of the carotid arteries in your neck. It looks at blood flow through these arteries that supply the brain with blood. Allow one hour for this exam. There are no restrictions or special instructions.  Your physician recommends that you continue on your current medications as directed. Please refer to the Current Medication list given to you today.  Your physician wants you to follow-up in: 6 months with Dr. Irish Lack. You will receive a reminder letter in the mail two months in advance. If you don't receive a letter, please call our office to schedule the follow-up appointment.

## 2014-07-25 NOTE — Progress Notes (Signed)
Carotid duplex performed 

## 2014-07-31 ENCOUNTER — Other Ambulatory Visit: Payer: Self-pay | Admitting: Cardiology

## 2014-07-31 ENCOUNTER — Telehealth: Payer: Self-pay | Admitting: Interventional Cardiology

## 2014-07-31 DIAGNOSIS — I6529 Occlusion and stenosis of unspecified carotid artery: Secondary | ICD-10-CM

## 2014-07-31 NOTE — Telephone Encounter (Signed)
New message     Returned Amy's call

## 2014-07-31 NOTE — Progress Notes (Signed)
Labs scanned from PCP

## 2014-07-31 NOTE — Telephone Encounter (Signed)
Returned call to pt's daughter

## 2015-01-02 ENCOUNTER — Emergency Department (HOSPITAL_COMMUNITY)
Admission: EM | Admit: 2015-01-02 | Discharge: 2015-01-02 | Disposition: A | Discharge status: Home / Self Care | Payer: Medicare Other | Attending: Emergency Medicine | Admitting: Emergency Medicine

## 2015-01-02 ENCOUNTER — Encounter (HOSPITAL_COMMUNITY): Payer: Self-pay | Admitting: Emergency Medicine

## 2015-01-02 ENCOUNTER — Emergency Department (HOSPITAL_COMMUNITY): Payer: Medicare Other

## 2015-01-02 DIAGNOSIS — Y9289 Other specified places as the place of occurrence of the external cause: Secondary | ICD-10-CM | POA: Insufficient documentation

## 2015-01-02 DIAGNOSIS — Y9389 Activity, other specified: Secondary | ICD-10-CM | POA: Diagnosis not present

## 2015-01-02 DIAGNOSIS — Z8673 Personal history of transient ischemic attack (TIA), and cerebral infarction without residual deficits: Secondary | ICD-10-CM | POA: Diagnosis not present

## 2015-01-02 DIAGNOSIS — F419 Anxiety disorder, unspecified: Secondary | ICD-10-CM | POA: Diagnosis not present

## 2015-01-02 DIAGNOSIS — F329 Major depressive disorder, single episode, unspecified: Secondary | ICD-10-CM | POA: Diagnosis not present

## 2015-01-02 DIAGNOSIS — Y998 Other external cause status: Secondary | ICD-10-CM | POA: Diagnosis not present

## 2015-01-02 DIAGNOSIS — Z7901 Long term (current) use of anticoagulants: Secondary | ICD-10-CM | POA: Diagnosis not present

## 2015-01-02 DIAGNOSIS — Z87891 Personal history of nicotine dependence: Secondary | ICD-10-CM | POA: Insufficient documentation

## 2015-01-02 DIAGNOSIS — W01198A Fall on same level from slipping, tripping and stumbling with subsequent striking against other object, initial encounter: Secondary | ICD-10-CM | POA: Diagnosis not present

## 2015-01-02 DIAGNOSIS — Z9889 Other specified postprocedural states: Secondary | ICD-10-CM | POA: Diagnosis not present

## 2015-01-02 DIAGNOSIS — Z7952 Long term (current) use of systemic steroids: Secondary | ICD-10-CM | POA: Insufficient documentation

## 2015-01-02 DIAGNOSIS — I10 Essential (primary) hypertension: Secondary | ICD-10-CM | POA: Diagnosis not present

## 2015-01-02 DIAGNOSIS — S199XXA Unspecified injury of neck, initial encounter: Secondary | ICD-10-CM | POA: Diagnosis not present

## 2015-01-02 DIAGNOSIS — E785 Hyperlipidemia, unspecified: Secondary | ICD-10-CM | POA: Diagnosis not present

## 2015-01-02 DIAGNOSIS — Z7982 Long term (current) use of aspirin: Secondary | ICD-10-CM | POA: Diagnosis not present

## 2015-01-02 DIAGNOSIS — K219 Gastro-esophageal reflux disease without esophagitis: Secondary | ICD-10-CM | POA: Insufficient documentation

## 2015-01-02 DIAGNOSIS — M542 Cervicalgia: Secondary | ICD-10-CM

## 2015-01-02 DIAGNOSIS — Z7983 Long term (current) use of bisphosphonates: Secondary | ICD-10-CM | POA: Diagnosis not present

## 2015-01-02 DIAGNOSIS — W19XXXA Unspecified fall, initial encounter: Secondary | ICD-10-CM

## 2015-01-02 DIAGNOSIS — I2510 Atherosclerotic heart disease of native coronary artery without angina pectoris: Secondary | ICD-10-CM | POA: Insufficient documentation

## 2015-01-02 DIAGNOSIS — J45909 Unspecified asthma, uncomplicated: Secondary | ICD-10-CM | POA: Insufficient documentation

## 2015-01-02 DIAGNOSIS — Z7951 Long term (current) use of inhaled steroids: Secondary | ICD-10-CM | POA: Insufficient documentation

## 2015-01-02 DIAGNOSIS — Z79899 Other long term (current) drug therapy: Secondary | ICD-10-CM | POA: Insufficient documentation

## 2015-01-02 DIAGNOSIS — S0990XA Unspecified injury of head, initial encounter: Secondary | ICD-10-CM | POA: Insufficient documentation

## 2015-01-02 LAB — URINALYSIS, ROUTINE W REFLEX MICROSCOPIC
Bilirubin Urine: NEGATIVE
Glucose, UA: NEGATIVE mg/dL
HGB URINE DIPSTICK: NEGATIVE
Ketones, ur: NEGATIVE mg/dL
LEUKOCYTES UA: NEGATIVE
NITRITE: NEGATIVE
Protein, ur: NEGATIVE mg/dL
Specific Gravity, Urine: 1.006 (ref 1.005–1.030)
Urobilinogen, UA: 0.2 mg/dL (ref 0.0–1.0)
pH: 6 (ref 5.0–8.0)

## 2015-01-02 LAB — CBC
HEMATOCRIT: 36.6 % (ref 36.0–46.0)
Hemoglobin: 11.7 g/dL — ABNORMAL LOW (ref 12.0–15.0)
MCH: 27 pg (ref 26.0–34.0)
MCHC: 32 g/dL (ref 30.0–36.0)
MCV: 84.5 fL (ref 78.0–100.0)
Platelets: 172 10*3/uL (ref 150–400)
RBC: 4.33 MIL/uL (ref 3.87–5.11)
RDW: 13.9 % (ref 11.5–15.5)
WBC: 6.9 10*3/uL (ref 4.0–10.5)

## 2015-01-02 LAB — BASIC METABOLIC PANEL
ANION GAP: 7 (ref 5–15)
BUN: 17 mg/dL (ref 6–23)
CALCIUM: 9.4 mg/dL (ref 8.4–10.5)
CO2: 26 mmol/L (ref 19–32)
CREATININE: 1.03 mg/dL (ref 0.50–1.10)
Chloride: 103 mmol/L (ref 96–112)
GFR calc Af Amer: 60 mL/min — ABNORMAL LOW (ref >90)
GFR, EST NON AFRICAN AMERICAN: 52 mL/min — AB (ref >90)
Glucose, Bld: 108 mg/dL — ABNORMAL HIGH (ref 70–99)
Potassium: 4 mmol/L (ref 3.5–5.1)
SODIUM: 136 mmol/L (ref 135–145)

## 2015-01-02 LAB — I-STAT TROPONIN, ED: Troponin i, poc: 0.01 ng/mL (ref 0.00–0.08)

## 2015-01-02 LAB — PROTIME-INR
INR: 1.36 (ref 0.00–1.49)
Prothrombin Time: 16.9 seconds — ABNORMAL HIGH (ref 11.6–15.2)

## 2015-01-02 LAB — CBG MONITORING, ED: Glucose-Capillary: 113 mg/dL — ABNORMAL HIGH (ref 70–99)

## 2015-01-02 MED ORDER — ACETAMINOPHEN 325 MG PO TABS
650.0000 mg | ORAL_TABLET | Freq: Once | ORAL | Status: AC
  Administered 2015-01-02: 650 mg via ORAL
  Filled 2015-01-02: qty 2

## 2015-01-02 NOTE — Discharge Instructions (Signed)
Return to the emergency room with worsening of symptoms, new symptoms or with symptoms that are concerning, , especially severe worsening of headache, visual or speech changes, weakness in face, arms or legs. Please call your doctor for a followup appointment within 24-48 hours. When you talk to your doctor please let them know that you were seen in the emergency department and have them acquire all of your records so that they can discuss the findings with you and formulate a treatment plan to fully care for your new and ongoing problems.    Concussion A concussion, or closed-head injury, is a brain injury caused by a direct blow to the head or by a quick and sudden movement (jolt) of the head or neck. Concussions are usually not life-threatening. Even so, the effects of a concussion can be serious. If you have had a concussion before, you are more likely to experience concussion-like symptoms after a direct blow to the head.  CAUSES  Direct blow to the head, such as from running into another player during a soccer game, being hit in a fight, or hitting your head on a hard surface.  A jolt of the head or neck that causes the brain to move back and forth inside the skull, such as in a car crash. SIGNS AND SYMPTOMS The signs of a concussion can be hard to notice. Early on, they may be missed by you, family members, and health care providers. You may look fine but act or feel differently. Symptoms are usually temporary, but they may last for days, weeks, or even longer. Some symptoms may appear right away while others may not show up for hours or days. Every head injury is different. Symptoms include:  Mild to moderate headaches that will not go away.  A feeling of pressure inside your head.  Having more trouble than usual:  Learning or remembering things you have heard.  Answering questions.  Paying attention or concentrating.  Organizing daily tasks.  Making decisions and solving  problems.  Slowness in thinking, acting or reacting, speaking, or reading.  Getting lost or being easily confused.  Feeling tired all the time or lacking energy (fatigued).  Feeling drowsy.  Sleep disturbances.  Sleeping more than usual.  Sleeping less than usual.  Trouble falling asleep.  Trouble sleeping (insomnia).  Loss of balance or feeling lightheaded or dizzy.  Nausea or vomiting.  Numbness or tingling.  Increased sensitivity to:  Sounds.  Lights.  Distractions.  Vision problems or eyes that tire easily.  Diminished sense of taste or smell.  Ringing in the ears.  Mood changes such as feeling sad or anxious.  Becoming easily irritated or angry for little or no reason.  Lack of motivation.  Seeing or hearing things other people do not see or hear (hallucinations). DIAGNOSIS Your health care provider can usually diagnose a concussion based on a description of your injury and symptoms. He or she will ask whether you passed out (lost consciousness) and whether you are having trouble remembering events that happened right before and during your injury. Your evaluation might include:  A brain scan to look for signs of injury to the brain. Even if the test shows no injury, you may still have a concussion.  Blood tests to be sure other problems are not present. TREATMENT  Concussions are usually treated in an emergency department, in urgent care, or at a clinic. You may need to stay in the hospital overnight for further treatment.  Tell your  health care provider if you are taking any medicines, including prescription medicines, over-the-counter medicines, and natural remedies. Some medicines, such as blood thinners (anticoagulants) and aspirin, may increase the chance of complications. Also tell your health care provider whether you have had alcohol or are taking illegal drugs. This information may affect treatment.  Your health care provider will send you  home with important instructions to follow.  How fast you will recover from a concussion depends on many factors. These factors include how severe your concussion is, what part of your brain was injured, your age, and how healthy you were before the concussion.  Most people with mild injuries recover fully. Recovery can take time. In general, recovery is slower in older persons. Also, persons who have had a concussion in the past or have other medical problems may find that it takes longer to recover from their current injury. HOME CARE INSTRUCTIONS General Instructions  Carefully follow the directions your health care provider gave you.  Only take over-the-counter or prescription medicines for pain, discomfort, or fever as directed by your health care provider.  Take only those medicines that your health care provider has approved.  Do not drink alcohol until your health care provider says you are well enough to do so. Alcohol and certain other drugs may slow your recovery and can put you at risk of further injury.  If it is harder than usual to remember things, write them down.  If you are easily distracted, try to do one thing at a time. For example, do not try to watch TV while fixing dinner.  Talk with family members or close friends when making important decisions.  Keep all follow-up appointments. Repeated evaluation of your symptoms is recommended for your recovery.  Watch your symptoms and tell others to do the same. Complications sometimes occur after a concussion. Older adults with a brain injury may have a higher risk of serious complications, such as a blood clot on the brain.  Tell your teachers, school nurse, school counselor, coach, athletic trainer, or work Freight forwarder about your injury, symptoms, and restrictions. Tell them about what you can or cannot do. They should watch for:  Increased problems with attention or concentration.  Increased difficulty remembering or  learning new information.  Increased time needed to complete tasks or assignments.  Increased irritability or decreased ability to cope with stress.  Increased symptoms.  Rest. Rest helps the brain to heal. Make sure you:  Get plenty of sleep at night. Avoid staying up late at night.  Keep the same bedtime hours on weekends and weekdays.  Rest during the day. Take daytime naps or rest breaks when you feel tired.  Limit activities that require a lot of thought or concentration. These include:  Doing homework or job-related work.  Watching TV.  Working on the computer.  Avoid any situation where there is potential for another head injury (football, hockey, soccer, basketball, martial arts, downhill snow sports and horseback riding). Your condition will get worse every time you experience a concussion. You should avoid these activities until you are evaluated by the appropriate follow-up health care providers. Returning To Your Regular Activities You will need to return to your normal activities slowly, not all at once. You must give your body and brain enough time for recovery.  Do not return to sports or other athletic activities until your health care provider tells you it is safe to do so.  Ask your health care provider when you can  drive, ride a bicycle, or operate heavy machinery. Your ability to react may be slower after a brain injury. Never do these activities if you are dizzy.  Ask your health care provider about when you can return to work or school. Preventing Another Concussion It is very important to avoid another brain injury, especially before you have recovered. In rare cases, another injury can lead to permanent brain damage, brain swelling, or death. The risk of this is greatest during the first 7-10 days after a head injury. Avoid injuries by:  Wearing a seat belt when riding in a car.  Drinking alcohol only in moderation.  Wearing a helmet when biking,  skiing, skateboarding, skating, or doing similar activities.  Avoiding activities that could lead to a second concussion, such as contact or recreational sports, until your health care provider says it is okay.  Taking safety measures in your home.  Remove clutter and tripping hazards from floors and stairways.  Use grab bars in bathrooms and handrails by stairs.  Place non-slip mats on floors and in bathtubs.  Improve lighting in dim areas. SEEK MEDICAL CARE IF:  You have increased problems paying attention or concentrating.  You have increased difficulty remembering or learning new information.  You need more time to complete tasks or assignments than before.  You have increased irritability or decreased ability to cope with stress.  You have more symptoms than before. Seek medical care if you have any of the following symptoms for more than 2 weeks after your injury:  Lasting (chronic) headaches.  Dizziness or balance problems.  Nausea.  Vision problems.  Increased sensitivity to noise or light.  Depression or mood swings.  Anxiety or irritability.  Memory problems.  Difficulty concentrating or paying attention.  Sleep problems.  Feeling tired all the time. SEEK IMMEDIATE MEDICAL CARE IF:  You have severe or worsening headaches. These may be a sign of a blood clot in the brain.  You have weakness (even if only in one hand, leg, or part of the face).  You have numbness.  You have decreased coordination.  You vomit repeatedly.  You have increased sleepiness.  One pupil is larger than the other.  You have convulsions.  You have slurred speech.  You have increased confusion. This may be a sign of a blood clot in the brain.  You have increased restlessness, agitation, or irritability.  You are unable to recognize people or places.  You have neck pain.  It is difficult to wake you up.  You have unusual behavior changes.  You lose  consciousness. MAKE SURE YOU:  Understand these instructions.  Will watch your condition.  Will get help right away if you are not doing well or get worse. Document Released: 02/05/2004 Document Revised: 11/20/2013 Document Reviewed: 06/07/2013 [facility] Patient Information 2015 [place], [place]. This information is not intended to replace advice given to you by your health care provider. Make sure you discuss any questions you have with your health care provider.

## 2015-01-02 NOTE — ED Notes (Addendum)
Pt states was dizzy this morning and went to sit on bed when she fell. Hit head but does not remember if she blacked out or not.

## 2015-01-02 NOTE — ED Provider Notes (Signed)
CSN: [id number]     Arrival date & time 01/02/15  1147 History   First MD Initiated Contact with Patient 01/02/15 1344     Chief Complaint  Patient presents with  . Fall     (Consider location/radiation/quality/duration/timing/severity/associated sxs/prior Treatment) HPI  Cindy [name] is a 74 y.o. female with PMH of stroke 2007, atrial fibrillation on Coumadin, hypertension, hyperlipidemia, anxiety presenting after a fall. A shunt lives in assisted living and was walking back from the bathroom and about to sit down on the bed and fell. She is unclear if she lost consciousness. She did hit her head. No one witnessed the fall. She stated she did feel lightheaded before she fell. Patient denies any headache, visual changes, slurred speech, weakness. No numbness or tingling. Patient with complaint of neck pain she is in a c-collar. Patient denies falling recently or feeling lightheaded. She denies any chest pain, shortness of breath, nausea, vomiting, abdominal pain, back pain. She denies pain anywhere else other than neck.    Past Medical History  Diagnosis Date  . Asthma   . Bronchitis   . Hypertension   . Recurrent upper respiratory infection (URI)   . GERD (gastroesophageal reflux disease)   . Depression   . Anxiety   . Dysrhythmia     atrial fibrillation  . Chest pain   . Cancer     uterian  . Barrett's esophagus   . Angina pectoris   . Coronary artery disease     right carotid occlusion   . Stroke     2007, after left endarterectomy  . Hyperlipidemia    Past Surgical History  Procedure Laterality Date  . Abdominal hysterectomy    . 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 Esophagogastroduodenoscopy  12/01/2011    Procedure: ESOPHAGOGASTRODUODENOSCOPY (EGD);  Surgeon: Lear Ng, MD;  Location: [facility];  Service: Endoscopy;  Laterality: N/A;  . Colonoscopy  12/01/2011    Procedure: COLONOSCOPY;  Surgeon: Lear Ng, MD;  Location: [facility];  Service: Endoscopy;  Laterality: N/A;  . Hot hemostasis  12/01/2011    Procedure: HOT HEMOSTASIS (ARGON PLASMA COAGULATION/BICAP);  Surgeon: Lear Ng, MD;  Location: [facility];  Service: Endoscopy;  Laterality: N/A;  . Carotid endarterectomy      left, complicated by stroke  . Cholecystectomy      incidental at time of colectomy  . Colonoscopy  10/18/2012    Procedure: COLONOSCOPY;  Surgeon: Lear Ng, MD;  Location: [facility];  Service: Endoscopy;  Laterality: N/A;  colonic dilation  . Balloon dilation  10/18/2012    Procedure: BALLOON DILATION;  Surgeon: Lear Ng, MD;  Location: [facility];  Service: Endoscopy;  Laterality: N/A;  . Cardiac catheterization     Family History  Problem Relation Age of Onset  . Cancer Sister     ovarian   History  Substance Use Topics  . Smoking status: Former Smoker    Quit date: 04/09/2003  . Smokeless tobacco: Never Used  . Alcohol Use: No   OB History    No data available     Review of Systems 10 Systems reviewed and are negative for acute change except as noted in the HPI.    Allergies  Review of patient's allergies indicates no known allergies.  Home Medications   Prior to Admission medications   Medication Sig Start Date End Date Taking? Authorizing Provider  acetaminophen (  TYLENOL) 325 MG tablet Take 650 mg by mouth every evening. For pain   Yes Historical Provider, MD  albuterol (PROVENTIL HFA;VENTOLIN HFA) 108 (90 BASE) MCG/ACT inhaler Inhale 2 puffs into the lungs every 6 (six) hours. For shortness of breath   Yes Historical Provider, MD  alendronate (FOSAMAX) 70 MG tablet Take 70 mg by mouth every Monday. Take with a full glass of water on an empty stomach.   Yes Historical Provider, MD  aspirin 81 MG chewable tablet Chew 81 mg by mouth daily.    Yes Historical Provider, MD  calcium citrate-vitamin D 200-200 MG-UNIT TABS Take 1 tablet by mouth daily.     Yes Historical Provider, MD  Chloroxylenol-Zinc Oxide (BAZA EX) Apply 1 application topically 2 (two) times daily as needed (affected areas on buttocks/sacral.).   Yes Historical Provider, MD  cholecalciferol (VITAMIN D) 1000 UNITS tablet Take 1,000 Units by mouth daily.   Yes Historical Provider, MD  clonazePAM (KLONOPIN) 0.5 MG tablet Take 0.5 mg by mouth 3 (three) times daily.    Yes Historical Provider, MD  ferrous sulfate 325 (65 FE) MG tablet Take 325 mg by mouth daily with breakfast.    Yes Historical Provider, MD  fluticasone (FLONASE) 50 MCG/ACT nasal spray Place 2 sprays into the nose daily.    Yes Historical Provider, MD  isosorbide mononitrate (IMDUR) 30 MG 24 hr tablet Take 30 mg by mouth daily.   Yes Historical Provider, MD  loratadine (CLARITIN) 10 MG tablet Take 10 mg by mouth daily.   Yes Historical Provider, MD  losartan (COZAAR) 50 MG tablet Take 50 mg by mouth every evening.   Yes Historical Provider, MD  Melatonin 3 MG CAPS Take 3 mg by mouth at bedtime.    Yes Historical Provider, MD  Mineral Oil Heavy OIL Place 1 drop into the right ear every Friday.   Yes Historical Provider, MD  nitroGLYCERIN (NITROSTAT) 0.4 MG SL tablet Place 0.4 mg under the tongue every 5 (five) minutes as needed for chest pain. For chest pain 01/17/14  Yes Jettie Booze, MD  omeprazole (PRILOSEC) 20 MG capsule Take 40 mg by mouth daily.    Yes Historical Provider, MD  phenol (QC SORE THROAT SPRAY) 1.4 % LIQD Use as directed 1 spray in the mouth or throat every 2 (two) hours as needed for throat irritation / pain.   Yes Historical Provider, MD  polyethylene glycol (MIRALAX / GLYCOLAX) packet Take 17 g by mouth daily as needed for mild constipation.   Yes Historical Provider, MD  polyvinyl alcohol (LIQUIFILM TEARS) 1.4 % ophthalmic solution Place 1 drop into both eyes 4 (four) times daily.   Yes Historical Provider, MD  sertraline (ZOLOFT) 100 MG tablet Take 200 mg by mouth daily.    Yes Historical  Provider, MD  simvastatin (ZOCOR) 20 MG tablet Take 20 mg by mouth at bedtime.    Yes Historical Provider, MD  sodium chloride (OCEAN) 0.65 % SOLN nasal spray Place 2 sprays into the nose every 4 (four) hours. Scheduled while awake only   Yes Historical Provider, MD  tiotropium (SPIRIVA) 18 MCG inhalation capsule Place 18 mcg into inhaler and inhale daily.   Yes Historical Provider, MD  traZODone (DESYREL) 100 MG tablet Take 200 mg by mouth at bedtime.    Yes Historical Provider, MD  triamcinolone cream (KENALOG) 0.1 % Apply 1 application topically 3 (three) times daily as needed (hand and heels .).   Yes Historical Provider, MD  warfarin (  COUMADIN) 4 MG tablet Take 4 mg by mouth daily.    Yes Historical Provider, MD  guaiFENesin (ROBITUSSIN) 100 MG/5ML liquid Take 200 mg by mouth every 6 (six) hours as needed for cough. For cough    Historical Provider, MD   BP 152/83 mmHg  Pulse 55  Temp(Src) 98.7 F (37.1 C) (Oral)  Resp 18  SpO2 95% Physical Exam  Constitutional: She appears well-developed and well-nourished. No distress.  HENT:  Head: Normocephalic.  Mouth/Throat: Oropharynx is clear and moist.  No hematoma or laceration.  Eyes: Conjunctivae and EOM are normal. Pupils are equal, round, and reactive to light. Right eye exhibits no discharge. Left eye exhibits no discharge.  Neck: Normal range of motion. Neck supple.  Patient without midline neck tenderness. Full range of motion with mild discomfort.  Cardiovascular: Normal rate, regular rhythm and normal heart sounds.   Pulmonary/Chest: Effort normal and breath sounds normal. No respiratory distress. She has no wheezes.  Abdominal: Soft. Bowel sounds are normal. She exhibits no distension. There is no tenderness.  Neurological: She is alert. No cranial nerve deficit. She exhibits normal muscle tone. Coordination normal.  Speech is clear and goal oriented. Peripheral visual fields intact. Strength 5/5 in upper and lower extremities.  Sensation intact. No pronator drift. Normal steady gait.   Skin: Skin is warm and dry. She is not diaphoretic.  Nursing note and vitals reviewed.   ED Course  Procedures (including critical care time) Labs Review Labs Reviewed  CBC - Abnormal; Notable for the following:    Hemoglobin 11.7 (*)    All other components within normal limits  BASIC METABOLIC PANEL - Abnormal; Notable for the following:    Glucose, Bld 108 (*)    GFR calc non Af Amer 52 (*)    GFR calc Af Amer 60 (*)    All other components within normal limits  URINALYSIS, ROUTINE W REFLEX MICROSCOPIC - Abnormal; Notable for the following:    APPearance CLOUDY (*)    All other components within normal limits  PROTIME-INR - Abnormal; Notable for the following:    Prothrombin Time 16.9 (*)    All other components within normal limits  CBG MONITORING, ED - Abnormal; Notable for the following:    Glucose-Capillary 113 (*)    All other components within normal limits  I-STAT TROPOININ, ED    Imaging Review Ct Head Wo Contrast  01/02/2015   CLINICAL DATA:  Status post fall today with a blow to the back of the head. Neck pain. Headache. Initial encounter.  EXAM: CT HEAD WITHOUT CONTRAST  CT CERVICAL SPINE WITHOUT CONTRAST  TECHNIQUE: Multidetector CT imaging of the head and cervical spine was performed following the standard protocol without intravenous contrast. Multiplanar CT image reconstructions of the cervical spine were also generated.  COMPARISON:  Head and cervical spine CT scan 06/10/2014.  FINDINGS: CT HEAD FINDINGS  Remote right parietal lobe infarct is again seen. There is also remote left frontal lobe infarct. Chronic microvascular ischemic change is noted. No acute abnormality including hemorrhage, infarct, mass lesion, mass effect, midline shift or abnormal extra-axial fluid collection is identified. Carotid atherosclerosis is seen. The calvarium is intact.  CT CERVICAL SPINE FINDINGS  No fracture or malalignment of  the cervical spine is identified. Straightening of the normal cervical lordosis is seen. There is loss of disc space height at C5-6 and C6-7. Scattered facet arthropathy is also present. Lung apices are clear.  IMPRESSION: No acute finding head or cervical spine.  Remote right parietal and left frontal infarcts.  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   Electronically Signed   By: Inge Rise M.D.   On: 01/02/2015 14:55   Ct Cervical Spine Wo Contrast  01/02/2015   CLINICAL DATA:  Status post fall today with a blow to the back of the head. Neck pain. Headache. Initial encounter.  EXAM: CT HEAD WITHOUT CONTRAST  CT CERVICAL SPINE WITHOUT CONTRAST  TECHNIQUE: Multidetector CT imaging of the head and cervical spine was performed following the standard protocol without intravenous contrast. Multiplanar CT image reconstructions of the cervical spine were also generated.  COMPARISON:  Head and cervical spine CT scan 06/10/2014.  FINDINGS: CT HEAD FINDINGS  Remote right parietal lobe infarct is again seen. There is also remote left frontal lobe infarct. Chronic microvascular ischemic change is noted. No acute abnormality including hemorrhage, infarct, mass lesion, mass effect, midline shift or abnormal extra-axial fluid collection is identified. Carotid atherosclerosis is seen. The calvarium is intact.  CT CERVICAL SPINE FINDINGS  No fracture or malalignment of the cervical spine is identified. Straightening of the normal cervical lordosis is seen. There is loss of disc space height at C5-6 and C6-7. Scattered facet arthropathy is also present. Lung apices are clear.  IMPRESSION: No acute finding head or cervical spine.  Remote right parietal and left frontal infarcts.  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   Electronically Signed   By: Inge Rise M.D.   On: 01/02/2015 14:55     EKG Interpretation None      MDM   Final diagnoses:  Fall  Head injury, initial encounter  Neck pain   Patient  presenting after a fall with head injury with prodrome of dizziness described as lightheadedness. She denies headache, visual changes, slurred speech, weakness. Patient on Coumadin. In ED patient lightheaded with standing. Patient with some evidence of orthostasis via vital signs. VSS. Patient in a c-collar. She is with neck tenderness. Neurological exam without deficits. Patient is mildly anemic but no other electrolyte abnormalities. Patient's Coumadin is subtherapeutic. Patient without any evidence of the tract infection. EKG without acute abnormalities. Screening troponin negative. CT head and neck without acute abnormalities. Patient with full range of motion of neck without significant discomfort. Patient clinically cleared. Patient without headache. I doubt subarachnoid, intracranial hemorrhage. Patient is afebrile, nontoxic, and in no acute distress. Patient is appropriate for outpatient management and is stable for discharge.  Discussed return precautions with patient. Discussed all results and patient verbalizes understanding and agrees with plan.  This is a shared patient. This patient was discussed with the physician who saw and evaluated the patient and agrees with the plan.     Pura Spice, PA-C 01/02/15 1710

## 2015-01-02 NOTE — ED Notes (Signed)
Patient transported to CT 

## 2015-01-02 NOTE — ED Notes (Addendum)
Pt fell out of bed and hit her head. Pt c/o neck pain. No LOC. Pt is on coumadin. Per report from assisted living, pt was found on floor and does not remember how she got there.

## 2015-01-02 NOTE — ED Provider Notes (Signed)
Medical screening examination/treatment/procedure(s) were conducted as a shared visit with non-physician practitioner(s) and myself.  I personally evaluated the patient during the encounter.   EKG Interpretation None       Results for orders placed or performed during the hospital encounter of 01/02/15  CBC  (at [facility])  Result Value Ref Range   WBC 6.9 4.0 - 10.5 K/uL   RBC 4.33 3.87 - 5.11 MIL/uL   Hemoglobin 11.7 (L) 12.0 - 15.0 g/dL   HCT 36.6 36.0 - 46.0 %   MCV 84.5 78.0 - 100.0 fL   MCH 27.0 26.0 - 34.0 pg   MCHC 32.0 30.0 - 36.0 g/dL   RDW 13.9 11.5 - 15.5 %   Platelets 172 150 - 400 K/uL  Basic metabolic panel  (at [facility])  Result Value Ref Range   Sodium 136 135 - 145 mmol/L   Potassium 4.0 3.5 - 5.1 mmol/L   Chloride 103 96 - 112 mmol/L   CO2 26 19 - 32 mmol/L   Glucose, Bld 108 (H) 70 - 99 mg/dL   BUN 17 6 - 23 mg/dL   Creatinine, Ser 1.03 0.50 - 1.10 mg/dL   Calcium 9.4 8.4 - 10.5 mg/dL   GFR calc non Af Amer 52 (L) >90 mL/min   GFR calc Af Amer 60 (L) >90 mL/min   Anion gap 7 5 - 15  Urinalysis, Routine w reflex microscopic  Result Value Ref Range   Color, Urine YELLOW YELLOW   APPearance CLOUDY (A) CLEAR   Specific Gravity, Urine 1.006 1.005 - 1.030   pH 6.0 5.0 - 8.0   Glucose, UA NEGATIVE NEGATIVE mg/dL   Hgb urine dipstick NEGATIVE NEGATIVE   Bilirubin Urine NEGATIVE NEGATIVE   Ketones, ur NEGATIVE NEGATIVE mg/dL   Protein, ur NEGATIVE NEGATIVE mg/dL   Urobilinogen, UA 0.2 0.0 - 1.0 mg/dL   Nitrite NEGATIVE NEGATIVE   Leukocytes, UA NEGATIVE NEGATIVE  Protime-INR  Result Value Ref Range   Prothrombin Time 16.9 (H) 11.6 - 15.2 seconds   INR 1.36 0.00 - 1.49  CBG, ED  Result Value Ref Range   Glucose-Capillary 113 (H) 70 - 99 mg/dL  I-Stat Troponin, ED (not at [facility])  Result Value Ref Range   Troponin i, poc 0.01 0.00 - 0.08 ng/mL   Comment 3           Ct Head Wo Contrast  01/02/2015   CLINICAL DATA:  Status post fall  today with a blow to the back of the head. Neck pain. Headache. Initial encounter.  EXAM: CT HEAD WITHOUT CONTRAST  CT CERVICAL SPINE WITHOUT CONTRAST  TECHNIQUE: Multidetector CT imaging of the head and cervical spine was performed following the standard protocol without intravenous contrast. Multiplanar CT image reconstructions of the cervical spine were also generated.  COMPARISON:  Head and cervical spine CT scan 06/10/2014.  FINDINGS: CT HEAD FINDINGS  Remote right parietal lobe infarct is again seen. There is also remote left frontal lobe infarct. Chronic microvascular ischemic change is noted. No acute abnormality including hemorrhage, infarct, mass lesion, mass effect, midline shift or abnormal extra-axial fluid collection is identified. Carotid atherosclerosis is seen. The calvarium is intact.  CT CERVICAL SPINE FINDINGS  No fracture or malalignment of the cervical spine is identified. Straightening of the normal cervical lordosis is seen. There is loss of disc space height at C5-6 and C6-7. Scattered facet arthropathy is also present. Lung apices are clear.  IMPRESSION: No acute  finding head or cervical spine.  Remote right parietal and left frontal infarcts.  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   Electronically Signed   By: Inge Rise M.D.   On: 01/02/2015 14:55   Ct Cervical Spine Wo Contrast  01/02/2015   CLINICAL DATA:  Status post fall today with a blow to the back of the head. Neck pain. Headache. Initial encounter.  EXAM: CT HEAD WITHOUT CONTRAST  CT CERVICAL SPINE WITHOUT CONTRAST  TECHNIQUE: Multidetector CT imaging of the head and cervical spine was performed following the standard protocol without intravenous contrast. Multiplanar CT image reconstructions of the cervical spine were also generated.  COMPARISON:  Head and cervical spine CT scan 06/10/2014.  FINDINGS: CT HEAD FINDINGS  Remote right parietal lobe infarct is again seen. There is also remote left frontal lobe infarct.  Chronic microvascular ischemic change is noted. No acute abnormality including hemorrhage, infarct, mass lesion, mass effect, midline shift or abnormal extra-axial fluid collection is identified. Carotid atherosclerosis is seen. The calvarium is intact.  CT CERVICAL SPINE FINDINGS  No fracture or malalignment of the cervical spine is identified. Straightening of the normal cervical lordosis is seen. There is loss of disc space height at C5-6 and C6-7. Scattered facet arthropathy is also present. Lung apices are clear.  IMPRESSION: No acute finding head or cervical spine.  Remote right parietal and left frontal infarcts.  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   Electronically Signed   By: Inge Rise M.D.   On: 01/02/2015 14:55     Date: 01/02/2015  Rate: 59  Rhythm: normal sinus rhythm  QRS Axis: normal  Intervals: normal  ST/T Wave abnormalities: nonspecific ST changes  Conduction Disutrbances:none  Narrative Interpretation:   Old EKG Reviewed: none available  Patient for evaluation of fall. Patient was dizzy this morning went to sit on bed when she fell. Did hit her head. Not sure about loss of consciousness. Fall was not witnessed. Patient is on Coumadin. INR is actually subtherapeutic. Patient's head CT CT of neck negative. Patient stable for discharge home.  Fredia Sorrow, MD 01/02/15 1623

## 2015-01-02 NOTE — ED Notes (Signed)
Bed: WA07 Expected date:  Expected time:  Means of arrival:  Comments: emw- 78 fall,on blood thinners

## 2015-01-31 ENCOUNTER — Ambulatory Visit (HOSPITAL_COMMUNITY): Payer: Medicare Other | Attending: Geriatric Medicine | Admitting: Cardiology

## 2015-01-31 DIAGNOSIS — R42 Dizziness and giddiness: Secondary | ICD-10-CM | POA: Diagnosis not present

## 2015-01-31 DIAGNOSIS — E785 Hyperlipidemia, unspecified: Secondary | ICD-10-CM | POA: Diagnosis not present

## 2015-01-31 DIAGNOSIS — I10 Essential (primary) hypertension: Secondary | ICD-10-CM | POA: Diagnosis not present

## 2015-01-31 DIAGNOSIS — I2510 Atherosclerotic heart disease of native coronary artery without angina pectoris: Secondary | ICD-10-CM | POA: Insufficient documentation

## 2015-01-31 DIAGNOSIS — Z8673 Personal history of transient ischemic attack (TIA), and cerebral infarction without residual deficits: Secondary | ICD-10-CM | POA: Diagnosis not present

## 2015-01-31 DIAGNOSIS — I6523 Occlusion and stenosis of bilateral carotid arteries: Secondary | ICD-10-CM | POA: Diagnosis not present

## 2015-01-31 DIAGNOSIS — I6529 Occlusion and stenosis of unspecified carotid artery: Secondary | ICD-10-CM

## 2015-01-31 NOTE — Progress Notes (Signed)
Carotid duplex performed 

## 2015-02-06 ENCOUNTER — Telehealth: Payer: Self-pay | Admitting: Interventional Cardiology

## 2015-02-06 NOTE — Telephone Encounter (Addendum)
New message ° ° ° ° ° °Calling to get carotid results °

## 2015-02-06 NOTE — Telephone Encounter (Signed)
Pt daughter, [name], made aware of Carotid results no questions at this time. Pt lives in facility

## 2015-02-06 NOTE — Telephone Encounter (Signed)
Left message for pt daughter, [name], to call back

## 2015-05-22 ENCOUNTER — Encounter: Payer: Self-pay | Admitting: Gastroenterology

## 2015-06-09 ENCOUNTER — Encounter: Payer: Self-pay | Admitting: *Deleted

## 2015-06-12 ENCOUNTER — Encounter: Payer: Self-pay | Admitting: Interventional Cardiology

## 2015-06-12 ENCOUNTER — Ambulatory Visit (INDEPENDENT_AMBULATORY_CARE_PROVIDER_SITE_OTHER): Payer: Medicare Other | Admitting: Interventional Cardiology

## 2015-06-12 VITALS — BP 100/56 | HR 69 | Ht 63.0 in | Wt 177.0 lb

## 2015-06-12 DIAGNOSIS — I480 Paroxysmal atrial fibrillation: Secondary | ICD-10-CM | POA: Diagnosis not present

## 2015-06-12 DIAGNOSIS — E669 Obesity, unspecified: Secondary | ICD-10-CM | POA: Diagnosis not present

## 2015-06-12 DIAGNOSIS — I6529 Occlusion and stenosis of unspecified carotid artery: Secondary | ICD-10-CM | POA: Diagnosis not present

## 2015-06-12 DIAGNOSIS — I10 Essential (primary) hypertension: Secondary | ICD-10-CM | POA: Diagnosis not present

## 2015-06-12 NOTE — Progress Notes (Signed)
Patient ID: Cindy [name], female   DOB: 21-Jan-1941, 74 y.o.   MRN: [id number]     Cardiology Office Note   Date:  06/12/2015   ID:  Cindy [name], DOB 1941-01-24, MRN [id number]  PCP:  [name], MD    Chief Complaint  Patient presents with  . Follow-up    occulsion and stenosis of carotid artery withour mention of cerebral infarction     Wt Readings from Last 3 Encounters:  06/12/15 177 lb (80.287 kg)  07/25/14 188 lb (85.276 kg)  01/17/14 190 lb (86.183 kg)       History of Present Illness: Cindy [name] is a 74 y.o. female  who has had moderate CAD. She has had GI issues with a hernia and a partial colon obstruction. No chest pain. Minimal walking.  She may need colonoscopy soon.Marland Kitchen Her Coumadin has been checked recently. She does minimal walking. She has some chronic shortness of breath with her activities. CAD/ASCVD:  Denies :  Diaphoresis.  Dizziness.  Dyspnea on exertion.  Leg edema.  Nitroglycerin.  Orthopnea.  Palpitations.   No further chest discomfort or NTG use.   She had one fall after tripping. She did not hit her head. For the most part, she is stable on her feet and has not fallen  Very much.    Past Medical History  Diagnosis Date  . Asthma   . Bronchitis   . Hypertension   . Recurrent upper respiratory infection (URI)   . GERD (gastroesophageal reflux disease)   . Depression   . Anxiety   . Dysrhythmia     atrial fibrillation  . Chest pain   . Cancer     uterian  . Barrett's esophagus   . Angina pectoris   . Coronary artery disease     right carotid occlusion   . Stroke     2007, after left endarterectomy  . Hyperlipidemia     Past Surgical History  Procedure Laterality Date  . Abdominal hysterectomy    . 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 Esophagogastroduodenoscopy  12/01/2011    Procedure: ESOPHAGOGASTRODUODENOSCOPY (EGD);  Surgeon: Lear Ng, MD;  Location: [facility];  Service: Endoscopy;  Laterality: N/A;  . Colonoscopy  12/01/2011    Procedure: COLONOSCOPY;  Surgeon: Lear Ng, MD;  Location: [facility];  Service: Endoscopy;  Laterality: N/A;  . Hot hemostasis  12/01/2011    Procedure: HOT HEMOSTASIS (ARGON PLASMA COAGULATION/BICAP);  Surgeon: Lear Ng, MD;  Location: [facility];  Service: Endoscopy;  Laterality: N/A;  . Carotid endarterectomy      left, complicated by stroke  . Cholecystectomy      incidental at time of colectomy  . Colonoscopy  10/18/2012    Procedure: COLONOSCOPY;  Surgeon: Lear Ng, MD;  Location: [facility];  Service: Endoscopy;  Laterality: N/A;  colonic dilation  . Balloon dilation  10/18/2012    Procedure: BALLOON DILATION;  Surgeon: Lear Ng, MD;  Location: [facility]COPY;  Service: Endoscopy;  Laterality: N/A;  . Cardiac catheterization       Current Outpatient Prescriptions  Medication Sig Dispense Refill  . acetaminophen (TYLENOL) 325 MG tablet Take 650 mg by mouth every evening. For pain    . albuterol (PROVENTIL HFA;VENTOLIN HFA) 108 (90 BASE) MCG/ACT inhaler Inhale 2 puffs into the lungs every 6 (six) hours. For shortness of breath    . alendronate (  FOSAMAX) 70 MG tablet Take 70 mg by mouth every Monday. Take with a full glass of water on an empty stomach.    Marland Kitchen aspirin 81 MG chewable tablet Chew 81 mg by mouth daily.     . calcium citrate-vitamin D 200-200 MG-UNIT TABS Take 1 tablet by mouth daily.     . Chloroxylenol-Zinc Oxide (BAZA EX) Apply 1 application topically 2 (two) times daily as needed (affected areas on buttocks/sacral.).    Marland Kitchen cholecalciferol (VITAMIN D) 1000 UNITS tablet Take 1,000 Units by mouth daily.    . clonazePAM (KLONOPIN) 0.5 MG tablet Take 0.5 mg by mouth 3 (three) times daily.     . ferrous sulfate 325 (65 FE) MG tablet Take 325 mg by mouth daily with breakfast.     . fluticasone (FLONASE) 50 MCG/ACT nasal spray Place 2 sprays into the nose  daily.     Marland Kitchen guaiFENesin (ROBITUSSIN) 100 MG/5ML liquid Take 200 mg by mouth every 6 (six) hours as needed for cough. For cough    . isosorbide mononitrate (IMDUR) 30 MG 24 hr tablet Take 30 mg by mouth daily.    Marland Kitchen loratadine (CLARITIN) 10 MG tablet Take 10 mg by mouth daily.    Marland Kitchen losartan (COZAAR) 50 MG tablet Take 50 mg by mouth every evening.    . Melatonin 3 MG CAPS Take 3 mg by mouth at bedtime.     [place] Face Oil Heavy OIL Place 1 drop into the right ear every Friday.    . nitroGLYCERIN (NITROSTAT) 0.4 MG SL tablet Place 0.4 mg under the tongue every 5 (five) minutes as needed for chest pain. For chest pain    . omeprazole (PRILOSEC) 20 MG capsule Take 40 mg by mouth daily.     . phenol (QC SORE THROAT SPRAY) 1.4 % LIQD Use as directed 1 spray in the mouth or throat every 2 (two) hours as needed for throat irritation / pain.    . polyethylene glycol (MIRALAX / GLYCOLAX) packet Take 17 g by mouth daily as needed for mild constipation.    . polyvinyl alcohol (LIQUIFILM TEARS) 1.4 % ophthalmic solution Place 1 drop into both eyes 4 (four) times daily.    . prednisoLONE acetate (PRED FORTE) 1 % ophthalmic suspension Place 1 drop into the right eye daily.    . sertraline (ZOLOFT) 100 MG tablet Take 200 mg by mouth daily.     . simvastatin (ZOCOR) 10 MG tablet Take 10 mg by mouth at bedtime.    . sodium chloride (OCEAN) 0.65 % SOLN nasal spray Place 2 sprays into the nose every 4 (four) hours. Scheduled while awake only    . tiotropium (SPIRIVA) 18 MCG inhalation capsule Place 18 mcg into inhaler and inhale daily.    . traZODone (DESYREL) 150 MG tablet Take 150 mg by mouth at bedtime.    . triamcinolone cream (KENALOG) 0.1 % Apply 1 application topically 3 (three) times daily as needed (hand and heels .).    Marland Kitchen warfarin (COUMADIN) 4 MG tablet Take 4 mg by mouth daily.      No current facility-administered medications for this visit.    Allergies:   Review of patient's allergies indicates no  known allergies.    Social History:  The patient  reports that she quit smoking about 12 years ago. She has never used smokeless tobacco. She reports that she does not drink alcohol or use illicit drugs.   Family History:  The patient's family history includes  Cancer in her mother and sister; Heart attack in her father; Heart disease in her mother.    ROS:  Please see the history of present illness.   Otherwise, review of systems are positive for  Knee pain.   All other systems are reviewed and negative.    PHYSICAL EXAM: VS:  BP 100/56 mmHg  Pulse 69  Ht 5\' 3"  (1.6 m)  Wt 177 lb (80.287 kg)  BMI 31.36 kg/m2  SpO2 93% , BMI Body mass index is 31.36 kg/(m^2). GEN: Well nourished, well developed, in no acute distress HEENT: normal Neck: no JVD, carotid bruits, or masses Cardiac: RRR; no murmurs, rubs, or gallops,no edema  Respiratory:  clear to auscultation bilaterally, normal work of breathing GI: soft, nontender, nondistended, + BS MS: no deformity or atrophy Skin: warm and dry, no rash Neuro:  Strength and sensation are intact Psych: euthymic mood, full affect   EKG:   The ekg ordered  In February 2016 showed normal sinus rhythm   Recent Labs: 01/02/2015: BUN 17; Creatinine, Ser 1.03; Hemoglobin 11.7*; Platelets 172; Potassium 4.0; Sodium 136   Lipid Panel No results found for: CHOL, TRIG, HDL, CHOLHDL, VLDL, LDLCALC, LDLDIRECT   Other studies Reviewed: Additional studies/ records that were reviewed today with results demonstrating: moderate CAD by cath.   ASSESSMENT AND PLAN:  1.  coronary artery disease: Moderate left main disease in the past. she did have a mildly abnormal stress test in the past. She had a catheterization done in [place] several years ago. With blood pressure control,her chest discomfort improved. No more episodes. Since she is asymptomatic, no indication for further cardiac workup at this time. 2.  hypertension: well controlled. Very few readings  in the 150-160 range. Most readings are in the 100 1:30 range. Continue current medications. She does better taking the losartan evening. 3.  obesity: She has lost weight with diet control. Continue lifestyle modifications.  4.  status post CVA in 2007/PAF: she has done well on Coumadin   Known right carotid occlusion. She needs regular carotid Dopplers.   Current medicines are reviewed at length with the patient today.  The patient concerns regarding her medicines were addressed.  The following changes have been made:  No change  Labs/ tests ordered today include:  No orders of the defined types were placed in this encounter.    Recommend 150 minutes/week of aerobic exercise Low fat, low carb, high fiber diet recommended  Disposition:   FU in 1 year   Teresita Madura., MD  06/12/2015 1:53 PM    [facility] [address], [place], [place]  [address] Phone: [phone]; Fax: [phone]

## 2015-06-12 NOTE — Patient Instructions (Signed)
**Note De-identified  Obfuscation** Medication Instructions:  Same-no change  Labwork: None  Testing/Procedures: None  Follow-Up: Your physician wants you to follow-up in: 1 year. You will receive a reminder letter in the mail two months in advance. If you don't receive a letter, please call our office to schedule the follow-up appointment.      

## 2015-06-19 ENCOUNTER — Emergency Department (HOSPITAL_COMMUNITY)
Admission: EM | Admit: 2015-06-19 | Discharge: 2015-06-20 | Disposition: A | Discharge status: Home / Self Care | Payer: Medicare Other | Attending: Emergency Medicine | Admitting: Emergency Medicine

## 2015-06-19 ENCOUNTER — Encounter (HOSPITAL_COMMUNITY): Payer: Self-pay | Admitting: Nurse Practitioner

## 2015-06-19 DIAGNOSIS — I10 Essential (primary) hypertension: Secondary | ICD-10-CM | POA: Insufficient documentation

## 2015-06-19 DIAGNOSIS — Z7951 Long term (current) use of inhaled steroids: Secondary | ICD-10-CM | POA: Diagnosis not present

## 2015-06-19 DIAGNOSIS — F419 Anxiety disorder, unspecified: Secondary | ICD-10-CM | POA: Insufficient documentation

## 2015-06-19 DIAGNOSIS — Y998 Other external cause status: Secondary | ICD-10-CM | POA: Diagnosis not present

## 2015-06-19 DIAGNOSIS — F329 Major depressive disorder, single episode, unspecified: Secondary | ICD-10-CM | POA: Diagnosis not present

## 2015-06-19 DIAGNOSIS — Z7901 Long term (current) use of anticoagulants: Secondary | ICD-10-CM | POA: Insufficient documentation

## 2015-06-19 DIAGNOSIS — Y9389 Activity, other specified: Secondary | ICD-10-CM | POA: Diagnosis not present

## 2015-06-19 DIAGNOSIS — W19XXXA Unspecified fall, initial encounter: Secondary | ICD-10-CM

## 2015-06-19 DIAGNOSIS — E785 Hyperlipidemia, unspecified: Secondary | ICD-10-CM | POA: Diagnosis not present

## 2015-06-19 DIAGNOSIS — E041 Nontoxic single thyroid nodule: Secondary | ICD-10-CM

## 2015-06-19 DIAGNOSIS — I25119 Atherosclerotic heart disease of native coronary artery with unspecified angina pectoris: Secondary | ICD-10-CM | POA: Insufficient documentation

## 2015-06-19 DIAGNOSIS — Z87891 Personal history of nicotine dependence: Secondary | ICD-10-CM | POA: Diagnosis not present

## 2015-06-19 DIAGNOSIS — Z79899 Other long term (current) drug therapy: Secondary | ICD-10-CM | POA: Insufficient documentation

## 2015-06-19 DIAGNOSIS — S0990XA Unspecified injury of head, initial encounter: Secondary | ICD-10-CM | POA: Diagnosis present

## 2015-06-19 DIAGNOSIS — Z8542 Personal history of malignant neoplasm of other parts of uterus: Secondary | ICD-10-CM | POA: Insufficient documentation

## 2015-06-19 DIAGNOSIS — W1839XA Other fall on same level, initial encounter: Secondary | ICD-10-CM | POA: Diagnosis not present

## 2015-06-19 DIAGNOSIS — K219 Gastro-esophageal reflux disease without esophagitis: Secondary | ICD-10-CM | POA: Diagnosis not present

## 2015-06-19 DIAGNOSIS — Z7982 Long term (current) use of aspirin: Secondary | ICD-10-CM | POA: Insufficient documentation

## 2015-06-19 DIAGNOSIS — Y9289 Other specified places as the place of occurrence of the external cause: Secondary | ICD-10-CM | POA: Diagnosis not present

## 2015-06-19 DIAGNOSIS — Z9861 Coronary angioplasty status: Secondary | ICD-10-CM | POA: Insufficient documentation

## 2015-06-19 DIAGNOSIS — J45909 Unspecified asthma, uncomplicated: Secondary | ICD-10-CM | POA: Insufficient documentation

## 2015-06-19 DIAGNOSIS — Z8673 Personal history of transient ischemic attack (TIA), and cerebral infarction without residual deficits: Secondary | ICD-10-CM | POA: Diagnosis not present

## 2015-06-19 NOTE — ED Notes (Addendum)
Pt is presented from [facility], report of an unwitnessed fall with impact to the head, pt is also on Warfarin, no skin tears or evidence of bleeding at this triage. Pt is complaining of 5/10 head ache. Denies LOC at the time of fall.

## 2015-06-19 NOTE — ED Provider Notes (Signed)
This chart was scribed for [place], DO by Forrestine Him, ED Scribe. This patient was seen in room WA13/WA13 and the patient's care was started 12:15 AM.   TIME SEEN: 12:15 AM   CHIEF COMPLAINT:  Chief Complaint  Patient presents with  . Fall     HPI:  HPI Comments: Cindy [name] from [facility] is a 74 y.o. female with a PMHx of atrial fibrillation, HTN, CAD, hyperlipidemia, and stroke who presents to the Emergency Department here after an unwitnessed fall sustained earlier this evening. Pt states a few people were working on her floor and she slipped on the uneven, slick wood. Denies any LOC at time of fall. No complaints of pain. Initially complained of headache with nursing staff but denies this currently. No OTC medications or home remedies attempted prior to arrival. Cindy [name] is currently on Coumadin daily. No known allergies to medications.   ROS: See HPI Constitutional: no fever  Eyes: no drainage  ENT: no runny nose   Cardiovascular:  no chest pain  Resp: no SOB  GI: no vomiting GU: no dysuria Integumentary: no rash  Allergy: no hives  Musculoskeletal: no leg swelling  Neurological: no slurred speech. Positive for HA ROS otherwise negative  PAST MEDICAL HISTORY/PAST SURGICAL HISTORY:  Past Medical History  Diagnosis Date  . Asthma   . Bronchitis   . Hypertension   . Recurrent upper respiratory infection (URI)   . GERD (gastroesophageal reflux disease)   . Depression   . Anxiety   . Dysrhythmia     atrial fibrillation  . Chest pain   . Cancer     uterian  . Barrett's esophagus   . Angina pectoris   . Coronary artery disease     right carotid occlusion   . Stroke     2007, after left endarterectomy  . Hyperlipidemia     MEDICATIONS:  Prior to Admission medications   Medication Sig Start Date End Date Taking? Authorizing Provider  acetaminophen (TYLENOL) 325 MG tablet Take 650 mg by mouth every evening. For pain   Yes Historical Provider, MD   albuterol (PROVENTIL HFA;VENTOLIN HFA) 108 (90 BASE) MCG/ACT inhaler Inhale 2 puffs into the lungs every 6 (six) hours. For shortness of breath   Yes Historical Provider, MD  aspirin 81 MG chewable tablet Chew 81 mg by mouth daily.    Yes Historical Provider, MD  calcium citrate-vitamin D 200-200 MG-UNIT TABS Take 1 tablet by mouth daily.    Yes Historical Provider, MD  cholecalciferol (VITAMIN D) 1000 UNITS tablet Take 1,000 Units by mouth daily.   Yes Historical Provider, MD  clonazePAM (KLONOPIN) 0.5 MG tablet Take 0.5 mg by mouth 3 (three) times daily.    Yes Historical Provider, MD  ferrous sulfate 325 (65 FE) MG tablet Take 325 mg by mouth daily with breakfast.    Yes Historical Provider, MD  fluticasone (FLONASE) 50 MCG/ACT nasal spray Place 2 sprays into the nose daily.    Yes Historical Provider, MD  isosorbide mononitrate (IMDUR) 30 MG 24 hr tablet Take 30 mg by mouth daily.   Yes Historical Provider, MD  loratadine (CLARITIN) 10 MG tablet Take 10 mg by mouth daily.   Yes Historical Provider, MD  losartan (COZAAR) 50 MG tablet Take 50 mg by mouth every evening.   Yes Historical Provider, MD  Melatonin 3 MG CAPS Take 3 mg by mouth at bedtime.    Yes Historical Provider, MD  Mineral Oil Heavy OIL  Place 1 drop into the right ear every Friday.   Yes Historical Provider, MD  omeprazole (PRILOSEC) 20 MG capsule Take 40 mg by mouth daily.    Yes Historical Provider, MD  sertraline (ZOLOFT) 100 MG tablet Take 200 mg by mouth daily.    Yes Historical Provider, MD  simvastatin (ZOCOR) 10 MG tablet Take 10 mg by mouth at bedtime. 05/29/15  Yes Historical Provider, MD  sodium chloride (OCEAN) 0.65 % SOLN nasal spray Place 2 sprays into the nose every 4 (four) hours. Scheduled while awake only   Yes Historical Provider, MD  tiotropium (SPIRIVA) 18 MCG inhalation capsule Place 18 mcg into inhaler and inhale daily.   Yes Historical Provider, MD  traZODone (DESYREL) 150 MG tablet Take 150 mg by mouth at  bedtime. 06/06/15  Yes Historical Provider, MD  warfarin (COUMADIN) 4 MG tablet Take 4 mg by mouth daily.    Yes Historical Provider, MD  alendronate (FOSAMAX) 70 MG tablet Take 70 mg by mouth every Monday. Take with a full glass of water on an empty stomach.    Historical Provider, MD  Chloroxylenol-Zinc Oxide (BAZA EX) Apply 1 application topically 2 (two) times daily as needed (affected areas on buttocks/sacral.).    Historical Provider, MD  guaiFENesin (ROBITUSSIN) 100 MG/5ML liquid Take 200 mg by mouth every 6 (six) hours as needed for cough. For cough    Historical Provider, MD  nitroGLYCERIN (NITROSTAT) 0.4 MG SL tablet Place 0.4 mg under the tongue every 5 (five) minutes as needed for chest pain. For chest pain 01/17/14   Jettie Booze, MD  phenol (QC SORE THROAT SPRAY) 1.4 % LIQD Use as directed 1 spray in the mouth or throat every 2 (two) hours as needed for throat irritation / pain.    Historical Provider, MD  polyethylene glycol (MIRALAX / GLYCOLAX) packet Take 17 g by mouth daily as needed for mild constipation.    Historical Provider, MD  triamcinolone cream (KENALOG) 0.1 % Apply 1 application topically 3 (three) times daily as needed (hand and heels .).    Historical Provider, MD    ALLERGIES:  No Known Allergies  SOCIAL HISTORY:  History  Substance Use Topics  . Smoking status: Former Smoker    Quit date: 04/09/2003  . Smokeless tobacco: Never Used  . Alcohol Use: No    FAMILY HISTORY: Family History  Problem Relation Age of Onset  . Cancer Sister     ovarian  . Heart disease Mother   . Cancer Mother   . Heart attack Father     EXAM: BP 105/45 mmHg  Pulse 61  Temp(Src) 98 F (36.7 C) (Oral)  Resp 14  SpO2 95% CONSTITUTIONAL: Alert and oriented x 3 and responds appropriately to questions. Well-appearing; well-nourished; GCS 15; elderly, no distress, pleasant, smiling HEAD: Normocephalic; atraumatic EYES: Conjunctivae clear, PERRL, EOMI ENT: normal nose; no  rhinorrhea; moist mucous membranes; pharynx without lesions noted; no dental injury; no septal hematoma NECK: Supple, no meningismus, no LAD; no midline spinal tenderness, step-off or deformity CARD: RRR; S1 and S2 appreciated; no murmurs, no clicks, no rubs, no gallops RESP: Normal chest excursion without splinting or tachypnea; breath sounds clear and equal bilaterally; no wheezes, no rhonchi, no rales; no hypoxia or respiratory distress CHEST:  chest wall stable, no crepitus or ecchymosis or deformity, nontender to palpation ABD/GI: Normal bowel sounds; non-distended; soft, non-tender, no rebound, no guarding PELVIS:  stable, nontender to palpation BACK:  The back appears normal and  is non-tender to palpation, there is no CVA tenderness; no midline spinal tenderness, step-off or deformity EXT: Normal ROM in all joints; non-tender to palpation; no edema; normal capillary refill; no cyanosis, no bony tenderness or bony deformity of patient's extremities, no joint effusion, no ecchymosis or lacerations    SKIN: Normal color for age and race; warm NEURO: Moves all extremities equally, sensation to light touch intact diffusely, cranial nerves II through XII intact PSYCH: The patient's mood and manner are appropriate. Grooming and personal hygiene are appropriate.  MEDICAL DECISION MAKING: Patient here with mechanical fall. CT of her head and cervical spine show no acute injury. No sign of trauma on exam. She is neurologically intact. Hemodynamically stable. She does have a hypodensity seen on her thyroid seen on CT which I have discussed with patient and recommended outpatient follow-up. Discussed return precautions. Patient verbalizes understanding and is comfortable with this plan.  D/c'ed back to [facility].  I personally performed the services described in this documentation, which was scribed in my presence. The recorded information has been reviewed and is accurate.   [place],  DO 06/20/15 [phone]

## 2015-06-19 NOTE — ED Notes (Signed)
Cindy [name] pt daughter [phone] for updates

## 2015-06-19 NOTE — ED Notes (Signed)
Bed: HA19 Expected date: 06/19/15 Expected time: 9:44 PM Means of arrival: Ambulance Comments: 74 yo F  Fall

## 2015-06-20 ENCOUNTER — Emergency Department (HOSPITAL_COMMUNITY): Payer: Medicare Other

## 2015-06-20 DIAGNOSIS — S0990XA Unspecified injury of head, initial encounter: Secondary | ICD-10-CM | POA: Diagnosis not present

## 2015-06-20 NOTE — Discharge Instructions (Signed)
You were found to have an abnormal finding on your CT scan over your thyroid. I recommend you follow-up with your primary care provider for this. This was not caused by her fall and is not anything that is life-threatening.   Fall Prevention and Home Safety Falls cause injuries and can affect all age groups. It is possible to use preventive measures to significantly decrease the likelihood of falls. There are many simple measures which can make your home safer and prevent falls. OUTDOORS  Repair cracks and edges of walkways and driveways.  Remove high doorway thresholds.  Trim shrubbery on the main path into your home.  Have good outside lighting.  Clear walkways of tools, rocks, debris, and clutter.  Check that handrails are not broken and are securely fastened. Both sides of steps should have handrails.  Have leaves, snow, and ice cleared regularly.  Use sand or salt on walkways during winter months.  In the garage, clean up grease or oil spills. BATHROOM  Install night lights.  Install grab bars by the toilet and in the tub and shower.  Use non-skid mats or decals in the tub or shower.  Place a plastic non-slip stool in the shower to sit on, if needed.  Keep floors dry and clean up all water on the floor immediately.  Remove soap buildup in the tub or shower on a regular basis.  Secure bath mats with non-slip, double-sided rug tape.  Remove throw rugs and tripping hazards from the floors. BEDROOMS  Install night lights.  Make sure a bedside light is easy to reach.  Do not use oversized bedding.  Keep a telephone by your bedside.  Have a firm chair with side arms to use for getting dressed.  Remove throw rugs and tripping hazards from the floor. KITCHEN  Keep handles on pots and pans turned toward the center of the stove. Use back burners when possible.  Clean up spills quickly and allow time for drying.  Avoid walking on wet floors.  Avoid hot utensils  and knives.  Position shelves so they are not too high or low.  Place commonly used objects within easy reach.  If necessary, use a sturdy step stool with a grab bar when reaching.  Keep electrical cables out of the way.  Do not use floor polish or wax that makes floors slippery. If you must use wax, use non-skid floor wax.  Remove throw rugs and tripping hazards from the floor. STAIRWAYS  Never leave objects on stairs.  Place handrails on both sides of stairways and use them. Fix any loose handrails. Make sure handrails on both sides of the stairways are as long as the stairs.  Check carpeting to make sure it is firmly attached along stairs. Make repairs to worn or loose carpet promptly.  Avoid placing throw rugs at the top or bottom of stairways, or properly secure the rug with carpet tape to prevent slippage. Get rid of throw rugs, if possible.  Have an electrician put in a light switch at the top and bottom of the stairs. OTHER FALL PREVENTION TIPS  Wear low-heel or rubber-soled shoes that are supportive and fit well. Wear closed toe shoes.  When using a stepladder, make sure it is fully opened and both spreaders are firmly locked. Do not climb a closed stepladder.  Add color or contrast paint or tape to grab bars and handrails in your home. Place contrasting color strips on first and last steps.  Learn and use mobility  aids as needed. Install an electrical emergency response system.  Turn on lights to avoid dark areas. Replace light bulbs that burn out immediately. Get light switches that glow.  Arrange furniture to create clear pathways. Keep furniture in the same place.  Firmly attach carpet with non-skid or double-sided tape.  Eliminate uneven floor surfaces.  Select a carpet pattern that does not visually hide the edge of steps.  Be aware of all pets. OTHER HOME SAFETY TIPS  Set the water temperature for 120 F (48.8 C).  Keep emergency numbers on or near  the telephone.  Keep smoke detectors on every level of the home and near sleeping areas. Document Released: 11/05/2002 Document Revised: 05/16/2012 Document Reviewed: 02/04/2012 [facility] Patient Information 2015 [place], [place]. This information is not intended to replace advice given to you by your health care provider. Make sure you discuss any questions you have with your health care provider.  Head Injury You have received a head injury. It does not appear serious at this time. Headaches and vomiting are common following head injury. It should be easy to awaken from sleeping. Sometimes it is necessary for you to stay in the emergency department for a while for observation. Sometimes admission to the hospital may be needed. After injuries such as yours, most problems occur within the first 24 hours, but side effects may occur up to 7-10 days after the injury. It is important for you to carefully monitor your condition and contact your health care provider or seek immediate medical care if there is a change in your condition. WHAT ARE THE TYPES OF HEAD INJURIES? Head injuries can be as minor as a bump. Some head injuries can be more severe. More severe head injuries include:  A jarring injury to the brain (concussion).  A bruise of the brain (contusion). This mean there is bleeding in the brain that can cause swelling.  A cracked skull (skull fracture).  Bleeding in the brain that collects, clots, and forms a bump (hematoma). WHAT CAUSES A HEAD INJURY? A serious head injury is most likely to happen to someone who is in a car wreck and is not wearing a seat belt. Other causes of major head injuries include bicycle or motorcycle accidents, sports injuries, and falls. HOW ARE HEAD INJURIES DIAGNOSED? A complete history of the event leading to the injury and your current symptoms will be helpful in diagnosing head injuries. Many times, pictures of the brain, such as CT or MRI are needed to see the  extent of the injury. Often, an overnight hospital stay is necessary for observation.  WHEN SHOULD I SEEK IMMEDIATE MEDICAL CARE?  You should get help right away if:  You have confusion or drowsiness.  You feel sick to your stomach (nauseous) or have continued, forceful vomiting.  You have dizziness or unsteadiness that is getting worse.  You have severe, continued headaches not relieved by medicine. Only take over-the-counter or prescription medicines for pain, fever, or discomfort as directed by your health care provider.  You do not have normal function of the arms or legs or are unable to walk.  You notice changes in the black spots in the center of the colored part of your eye (pupil).  You have a clear or bloody fluid coming from your nose or ears.  You have a loss of vision. During the next 24 hours after the injury, you must stay with someone who can watch you for the warning signs. This person should contact local  emergency services (911 in the [place] if you have seizures, you become unconscious, or you are unable to wake up. HOW CAN I PREVENT A HEAD INJURY IN THE FUTURE? The most important factor for preventing major head injuries is avoiding motor vehicle accidents. To minimize the potential for damage to your head, it is crucial to wear seat belts while riding in motor vehicles. Wearing helmets while bike riding and playing collision sports (like football) is also helpful. Also, avoiding dangerous activities around the house will further help reduce your risk of head injury.  WHEN CAN I RETURN TO NORMAL ACTIVITIES AND ATHLETICS? You should be reevaluated by your health care provider before returning to these activities. If you have any of the following symptoms, you should not return to activities or contact sports until 1 week after the symptoms have stopped:  Persistent headache.  Dizziness or vertigo.  Poor attention and concentration.  Confusion.  Memory  problems.  Nausea or vomiting.  Fatigue or tire easily.  Irritability.  Intolerant of bright lights or loud noises.  Anxiety or depression.  Disturbed sleep. MAKE SURE YOU:   Understand these instructions.  Will watch your condition.  Will get help right away if you are not doing well or get worse. Document Released: 11/15/2005 Document Revised: 11/20/2013 Document Reviewed: 07/23/2013 [facility] Patient Information 2015 [place], [place]. This information is not intended to replace advice given to you by your health care provider. Make sure you discuss any questions you have with your health care provider.

## 2015-06-20 NOTE — ED Notes (Signed)
Report called to [facility] and [facility] called for transport

## 2016-01-27 ENCOUNTER — Other Ambulatory Visit: Payer: Self-pay | Admitting: Interventional Cardiology

## 2016-01-27 DIAGNOSIS — I6523 Occlusion and stenosis of bilateral carotid arteries: Secondary | ICD-10-CM

## 2016-02-10 ENCOUNTER — Ambulatory Visit (HOSPITAL_COMMUNITY)
Admission: RE | Admit: 2016-02-10 | Discharge: 2016-02-10 | Disposition: A | Discharge status: Home / Self Care | Payer: Medicare Other | Source: Ambulatory Visit | Attending: Cardiovascular Disease | Admitting: Cardiovascular Disease

## 2016-02-10 DIAGNOSIS — E785 Hyperlipidemia, unspecified: Secondary | ICD-10-CM | POA: Diagnosis not present

## 2016-02-10 DIAGNOSIS — I6523 Occlusion and stenosis of bilateral carotid arteries: Secondary | ICD-10-CM | POA: Insufficient documentation

## 2016-02-10 DIAGNOSIS — I10 Essential (primary) hypertension: Secondary | ICD-10-CM | POA: Insufficient documentation

## 2016-02-16 ENCOUNTER — Telehealth: Payer: Self-pay | Admitting: Interventional Cardiology

## 2016-02-16 NOTE — Telephone Encounter (Signed)
New message ° ° ° ° °Returning a call to the nurse °

## 2016-02-16 NOTE — Telephone Encounter (Signed)
**Note De-Identified  Obfuscation** The pts daughter is advised of the pts Carotid duplex results. She verbalized understanding and states that she will notify the pt of her results as well.

## 2016-03-08 ENCOUNTER — Other Ambulatory Visit: Payer: Self-pay

## 2016-03-25 ENCOUNTER — Encounter: Payer: Self-pay | Admitting: Vascular Surgery

## 2016-03-30 ENCOUNTER — Encounter: Payer: Medicare Other | Admitting: Vascular Surgery

## 2016-04-02 ENCOUNTER — Encounter: Payer: Self-pay | Admitting: Vascular Surgery

## 2016-04-07 ENCOUNTER — Encounter: Payer: Medicare Other | Admitting: Vascular Surgery

## 2016-04-09 ENCOUNTER — Encounter: Payer: Medicare Other | Admitting: Vascular Surgery

## 2016-04-25 ENCOUNTER — Encounter (HOSPITAL_COMMUNITY): Payer: Self-pay | Admitting: Emergency Medicine

## 2016-04-25 ENCOUNTER — Emergency Department (HOSPITAL_COMMUNITY): Payer: Medicare Other

## 2016-04-25 ENCOUNTER — Emergency Department (HOSPITAL_COMMUNITY)
Admission: EM | Admit: 2016-04-25 | Discharge: 2016-04-26 | Disposition: A | Discharge status: Home / Self Care | Payer: Medicare Other | Attending: Emergency Medicine | Admitting: Emergency Medicine

## 2016-04-25 DIAGNOSIS — Z7901 Long term (current) use of anticoagulants: Secondary | ICD-10-CM | POA: Diagnosis not present

## 2016-04-25 DIAGNOSIS — I25119 Atherosclerotic heart disease of native coronary artery with unspecified angina pectoris: Secondary | ICD-10-CM | POA: Insufficient documentation

## 2016-04-25 DIAGNOSIS — S0990XA Unspecified injury of head, initial encounter: Secondary | ICD-10-CM | POA: Diagnosis not present

## 2016-04-25 DIAGNOSIS — Y9301 Activity, walking, marching and hiking: Secondary | ICD-10-CM | POA: Diagnosis not present

## 2016-04-25 DIAGNOSIS — S40011A Contusion of right shoulder, initial encounter: Secondary | ICD-10-CM

## 2016-04-25 DIAGNOSIS — S0083XA Contusion of other part of head, initial encounter: Secondary | ICD-10-CM | POA: Insufficient documentation

## 2016-04-25 DIAGNOSIS — Z9889 Other specified postprocedural states: Secondary | ICD-10-CM | POA: Diagnosis not present

## 2016-04-25 DIAGNOSIS — Z7952 Long term (current) use of systemic steroids: Secondary | ICD-10-CM | POA: Diagnosis not present

## 2016-04-25 DIAGNOSIS — K219 Gastro-esophageal reflux disease without esophagitis: Secondary | ICD-10-CM | POA: Insufficient documentation

## 2016-04-25 DIAGNOSIS — F419 Anxiety disorder, unspecified: Secondary | ICD-10-CM | POA: Diagnosis not present

## 2016-04-25 DIAGNOSIS — Z7982 Long term (current) use of aspirin: Secondary | ICD-10-CM | POA: Insufficient documentation

## 2016-04-25 DIAGNOSIS — J45909 Unspecified asthma, uncomplicated: Secondary | ICD-10-CM | POA: Diagnosis not present

## 2016-04-25 DIAGNOSIS — Y92002 Bathroom of unspecified non-institutional (private) residence single-family (private) house as the place of occurrence of the external cause: Secondary | ICD-10-CM | POA: Insufficient documentation

## 2016-04-25 DIAGNOSIS — Z87891 Personal history of nicotine dependence: Secondary | ICD-10-CM | POA: Diagnosis not present

## 2016-04-25 DIAGNOSIS — W010XXA Fall on same level from slipping, tripping and stumbling without subsequent striking against object, initial encounter: Secondary | ICD-10-CM | POA: Insufficient documentation

## 2016-04-25 DIAGNOSIS — Z8673 Personal history of transient ischemic attack (TIA), and cerebral infarction without residual deficits: Secondary | ICD-10-CM | POA: Diagnosis not present

## 2016-04-25 DIAGNOSIS — Z859 Personal history of malignant neoplasm, unspecified: Secondary | ICD-10-CM | POA: Diagnosis not present

## 2016-04-25 DIAGNOSIS — Z79899 Other long term (current) drug therapy: Secondary | ICD-10-CM | POA: Diagnosis not present

## 2016-04-25 DIAGNOSIS — S4991XA Unspecified injury of right shoulder and upper arm, initial encounter: Secondary | ICD-10-CM | POA: Diagnosis present

## 2016-04-25 DIAGNOSIS — Y998 Other external cause status: Secondary | ICD-10-CM | POA: Diagnosis not present

## 2016-04-25 DIAGNOSIS — F329 Major depressive disorder, single episode, unspecified: Secondary | ICD-10-CM | POA: Insufficient documentation

## 2016-04-25 DIAGNOSIS — I10 Essential (primary) hypertension: Secondary | ICD-10-CM | POA: Diagnosis not present

## 2016-04-25 NOTE — ED Provider Notes (Addendum)
CSN: [id number]     Arrival date & time 04/25/16  2318 History   First MD Initiated Contact with Patient 04/25/16 2330     Chief Complaint  Patient presents with  . Fall     (Consider location/radiation/quality/duration/timing/severity/associated sxs/prior Treatment) HPI Comments: Patient presents to the emergency department for evaluation after a fall. Patient reports that she got up to go to the bathroom and tripped, falling forward. She had her chin and shoulder on the ground. She does not think she was knocked out. She does not complain of any pain at arrival, but there is bruising noted to face and right shoulder.   Past Medical History  Diagnosis Date  . Asthma   . Bronchitis   . Hypertension   . Recurrent upper respiratory infection (URI)   . GERD (gastroesophageal reflux disease)   . Depression   . Anxiety   . Dysrhythmia     atrial fibrillation  . Chest pain   . 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 Barrett's esophagus   . Angina pectoris ([place])   . Coronary artery disease     right carotid occlusion   . Stroke [facility])     2007, after left endarterectomy  . Hyperlipidemia    Past Surgical History  Procedure Laterality Date  . Abdominal hysterectomy    . 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 Esophagogastroduodenoscopy  12/01/2011    Procedure: ESOPHAGOGASTRODUODENOSCOPY (EGD);  Surgeon: Lear Ng, MD;  Location: [facility];  Service: Endoscopy;  Laterality: N/A;  . Colonoscopy  12/01/2011    Procedure: COLONOSCOPY;  Surgeon: Lear Ng, MD;  Location: [facility] ENDOSCOPY;  Service: Endoscopy;  Laterality: N/A;  . Hot hemostasis  12/01/2011    Procedure: HOT HEMOSTASIS (ARGON PLASMA COAGULATION/BICAP);  Surgeon: Lear Ng, MD;  Location: [facility];  Service: Endoscopy;  Laterality: N/A;  . Carotid endarterectomy      left, complicated by stroke  . Cholecystectomy      incidental at time of colectomy  .  Colonoscopy  10/18/2012    Procedure: COLONOSCOPY;  Surgeon: Lear Ng, MD;  Location: [facility] ENDOSCOPY;  Service: Endoscopy;  Laterality: N/A;  colonic dilation  . Balloon dilation  10/18/2012    Procedure: BALLOON DILATION;  Surgeon: Lear Ng, MD;  Location: [facility] ENDOSCOPY;  Service: Endoscopy;  Laterality: N/A;  . Cardiac catheterization     Family History  Problem Relation Age of Onset  . Cancer Sister     ovarian  . Heart disease Mother   . Cancer Mother   . Heart attack Father    Social History  Substance Use Topics  . Smoking status: Former Smoker    Quit date: 04/09/2003  . Smokeless tobacco: Never Used  . Alcohol Use: No   OB History    No data available     Review of Systems  Musculoskeletal: Negative for back pain and neck pain.  Neurological: Negative for headaches.  All other systems reviewed and are negative.     Allergies  Review of patient's allergies indicates no known allergies.  Home Medications   Prior to Admission medications   Medication Sig Start Date End Date Taking? Authorizing Provider  acetaminophen (TYLENOL) 325 MG tablet Take 650 mg by mouth every evening. For pain    Historical Provider, MD  albuterol (PROVENTIL HFA;VENTOLIN HFA) 108 (90 BASE) MCG/ACT inhaler Inhale 2 puffs into the lungs every  6 (six) hours. For shortness of breath    Historical Provider, MD  alendronate (FOSAMAX) 70 MG tablet Take 70 mg by mouth every Monday. Take with a full glass of water on an empty stomach.    Historical Provider, MD  aspirin 81 MG chewable tablet Chew 81 mg by mouth daily.     Historical Provider, MD  calcium citrate-vitamin D 200-200 MG-UNIT TABS Take 1 tablet by mouth daily.     Historical Provider, MD  Chloroxylenol-Zinc Oxide (BAZA EX) Apply 1 application topically 2 (two) times daily as needed (affected areas on buttocks/sacral.).    Historical Provider, MD  cholecalciferol (VITAMIN D) 1000 UNITS tablet Take 1,000 Units by mouth  daily.    Historical Provider, MD  clonazePAM (KLONOPIN) 0.5 MG tablet Take 0.5 mg by mouth 3 (three) times daily.     Historical Provider, MD  ferrous sulfate 325 (65 FE) MG tablet Take 325 mg by mouth daily with breakfast.     Historical Provider, MD  fluticasone (FLONASE) 50 MCG/ACT nasal spray Place 2 sprays into the nose daily.     Historical Provider, MD  guaiFENesin (ROBITUSSIN) 100 MG/5ML liquid Take 200 mg by mouth every 6 (six) hours as needed for cough. For cough    Historical Provider, MD  isosorbide mononitrate (IMDUR) 30 MG 24 hr tablet Take 30 mg by mouth daily.    Historical Provider, MD  loratadine (CLARITIN) 10 MG tablet Take 10 mg by mouth daily.    Historical Provider, MD  losartan (COZAAR) 50 MG tablet Take 50 mg by mouth every evening.    Historical Provider, MD  Melatonin 3 MG CAPS Take 3 mg by mouth at bedtime.     Historical Provider, MD  Mineral Oil Heavy OIL Place 1 drop into the right ear every Friday.    Historical Provider, MD  nitroGLYCERIN (NITROSTAT) 0.4 MG SL tablet Place 0.4 mg under the tongue every 5 (five) minutes as needed for chest pain. For chest pain 01/17/14   Jettie Booze, MD  omeprazole (PRILOSEC) 20 MG capsule Take 40 mg by mouth daily.     Historical Provider, MD  phenol (QC SORE THROAT SPRAY) 1.4 % LIQD Use as directed 1 spray in the mouth or throat every 2 (two) hours as needed for throat irritation / pain.    Historical Provider, MD  polyethylene glycol (MIRALAX / GLYCOLAX) packet Take 17 g by mouth daily as needed for mild constipation.    Historical Provider, MD  sertraline (ZOLOFT) 100 MG tablet Take 200 mg by mouth daily.     Historical Provider, MD  simvastatin (ZOCOR) 10 MG tablet Take 10 mg by mouth at bedtime. 05/29/15   Historical Provider, MD  sodium chloride (OCEAN) 0.65 % SOLN nasal spray Place 2 sprays into the nose every 4 (four) hours. Scheduled while awake only    Historical Provider, MD  tiotropium (SPIRIVA) 18 MCG inhalation  capsule Place 18 mcg into inhaler and inhale daily.    Historical Provider, MD  traZODone (DESYREL) 150 MG tablet Take 150 mg by mouth at bedtime. 06/06/15   Historical Provider, MD  triamcinolone cream (KENALOG) 0.1 % Apply 1 application topically 3 (three) times daily as needed (hand and heels .).    Historical Provider, MD  warfarin (COUMADIN) 4 MG tablet Take 4 mg by mouth daily.     Historical Provider, MD   BP 128/106 mmHg  Pulse 80  Temp(Src) 97.8 F (36.6 C) (Oral)  Resp 20  Ht 5\' 3"  (1.6 m)  Wt 159 lb (72.122 kg)  BMI 28.17 kg/m2  SpO2 98% Physical Exam  Constitutional: She is oriented to person, place, and time. She appears well-developed and well-nourished. No distress.  HENT:  Head: Normocephalic. Head is with contusion (chin).  Right Ear: Hearing normal.  Left Ear: Hearing normal.  Nose: Nose normal.  Mouth/Throat: Oropharynx is clear and moist and mucous membranes are normal.  Eyes: Conjunctivae and EOM are normal. Pupils are equal, round, and reactive to light.  Neck: Normal range of motion. Neck supple.  Cardiovascular: Regular rhythm, S1 normal and S2 normal.  Exam reveals no gallop and no friction rub.   No murmur heard. Pulmonary/Chest: Effort normal and breath sounds normal. No respiratory distress. She exhibits no tenderness.  Abdominal: Soft. Normal appearance and bowel sounds are normal. There is no hepatosplenomegaly. There is no tenderness. There is no rebound, no guarding, no tenderness at McBurney's point and negative Murphy's sign. No hernia.  Musculoskeletal: Normal range of motion.       Right shoulder: She exhibits tenderness. She exhibits normal range of motion and no deformity.       Right hip: Normal.       Left hip: Normal.  Neurological: She is alert and oriented to person, place, and time. She has normal strength. No cranial nerve deficit or sensory deficit. Coordination normal. GCS eye subscore is 4. GCS verbal subscore is 5. GCS motor subscore is  6.  Skin: Skin is warm, dry and intact. Bruising (chin and right shoulder) noted. No rash noted. No cyanosis.  Psychiatric: She has a normal mood and affect. Her speech is normal and behavior is normal. Thought content normal.  Nursing note and vitals reviewed.   ED Course  Procedures (including critical care time) Labs Review Labs Reviewed  CBC  BASIC METABOLIC PANEL  PROTIME-INR    Imaging Review Dg Chest 1 View  04/26/2016  CLINICAL DATA:  Injury, fall. EXAM: CHEST 1 VIEW COMPARISON:  Radiographs 06/10/2014 FINDINGS: Cardiomegaly and atherosclerosis of the aortic arch are unchanged. Lung symmetrically inflated. No focal consolidation, large pleural effusion or pneumothorax. No grossly displaced rib fracture. Remote right proximal humerus fracture again seen. IMPRESSION: Stable cardiomegaly. No evidence of acute traumatic process in the thorax. Electronically Signed   By: Jeb Levering M.D.   On: 04/26/2016 00:39   Dg Pelvis 1-2 Views  04/26/2016  CLINICAL DATA:  Injury, fall. EXAM: PELVIS - 1-2 VIEW COMPARISON:  None. FINDINGS: The cortical margins of the bony pelvis are intact. No fracture. Pubic symphysis and sacroiliac joints are congruent. Both femoral heads are well-seated in the respective acetabula. Multifocal degenerative change involving the pubic symphysis, both sacroiliac joints, and both hips to lesser extent. IMPRESSION: No evidence of acute pelvic fracture. Electronically Signed   By: Jeb Levering M.D.   On: 04/26/2016 00:38   Dg Shoulder Right  04/26/2016  CLINICAL DATA:  Right shoulder bruising after fall. EXAM: RIGHT SHOULDER - 2+ VIEW COMPARISON:  No prior shoulder imaging. Chest radiograph 01/07/2012. FINDINGS: Chronic deformity of the right proximal humerus, unchanged in alignment from prior chest radiograph. No definite acute fracture component. The acromioclavicular joint is congruent. No dislocation. IMPRESSION: Remote right proximal humerus fracture, unchanged  in alignment from chest radiograph 01/07/2012. No evidence of superimposed acute fracture. Electronically Signed   By: Jeb Levering M.D.   On: 04/26/2016 00:33   Ct Head Wo Contrast  04/26/2016  CLINICAL DATA:  Fall tonight with facial bruising.  EXAM: CT HEAD WITHOUT CONTRAST CT MAXILLOFACIAL WITHOUT CONTRAST CT CERVICAL SPINE WITHOUT CONTRAST TECHNIQUE: Multidetector CT imaging of the head, cervical spine, and maxillofacial structures were performed using the standard protocol without intravenous contrast. Multiplanar CT image reconstructions of the cervical spine and maxillofacial structures were also generated. COMPARISON:  Head and cervical spine CT 06/20/2015 FINDINGS: CT HEAD FINDINGS No acute intracranial hemorrhage. Generalized atrophy and chronic small vessel ischemia again seen. Prior infarcts in the right parietal and left frontoparietal lobes with encephalomalacia is unchanged. No subdural fluid collection. Bilateral basal gangliar calcifications. Atherosclerosis of skullbase vasculature. No calvarial fracture. CT MAXILLOFACIAL FINDINGS No facial bone fracture. The orbits and globes are intact. The nasal bone, mandibles, zygomatic arches and pterygoid plates are intact. Paranasal sinuses are well-aerated without fluid level. Patient is edentulous. Submental soft tissue edema. CT CERVICAL SPINE FINDINGS Patient motion resulted in repeat image acquisition. No acute fracture or subluxation. There is straightening of normal lordosis. Disc space narrowing at C5-C6 and C6-C7 with endplate spurring. Multifocal facet arthropathy throughout. The dens is intact. There are no jumped or perched facets. No prevertebral soft tissue edema. 1.6 cm hypodense nodule in the right lobe of the thyroid gland is unchanged dating back to the CT 06/10/2014. IMPRESSION: 1. No acute intracranial abnormality. Encephalomalacia from remote prior infarcts unchanged. 2. No facial bone fracture.  Submental edema. 3. Degenerative  change throughout cervical spine without acute fracture or subluxation. Electronically Signed   By: Jeb Levering M.D.   On: 04/26/2016 00:51   Ct Cervical Spine Wo Contrast  04/26/2016  CLINICAL DATA:  Fall tonight with facial bruising. EXAM: CT HEAD WITHOUT CONTRAST CT MAXILLOFACIAL WITHOUT CONTRAST CT CERVICAL SPINE WITHOUT CONTRAST TECHNIQUE: Multidetector CT imaging of the head, cervical spine, and maxillofacial structures were performed using the standard protocol without intravenous contrast. Multiplanar CT image reconstructions of the cervical spine and maxillofacial structures were also generated. COMPARISON:  Head and cervical spine CT 06/20/2015 FINDINGS: CT HEAD FINDINGS No acute intracranial hemorrhage. Generalized atrophy and chronic small vessel ischemia again seen. Prior infarcts in the right parietal and left frontoparietal lobes with encephalomalacia is unchanged. No subdural fluid collection. Bilateral basal gangliar calcifications. Atherosclerosis of skullbase vasculature. No calvarial fracture. CT MAXILLOFACIAL FINDINGS No facial bone fracture. The orbits and globes are intact. The nasal bone, mandibles, zygomatic arches and pterygoid plates are intact. Paranasal sinuses are well-aerated without fluid level. Patient is edentulous. Submental soft tissue edema. CT CERVICAL SPINE FINDINGS Patient motion resulted in repeat image acquisition. No acute fracture or subluxation. There is straightening of normal lordosis. Disc space narrowing at C5-C6 and C6-C7 with endplate spurring. Multifocal facet arthropathy throughout. The dens is intact. There are no jumped or perched facets. No prevertebral soft tissue edema. 1.6 cm hypodense nodule in the right lobe of the thyroid gland is unchanged dating back to the CT 06/10/2014. IMPRESSION: 1. No acute intracranial abnormality. Encephalomalacia from remote prior infarcts unchanged. 2. No facial bone fracture.  Submental edema. 3. Degenerative change  throughout cervical spine without acute fracture or subluxation. Electronically Signed   By: Jeb Levering M.D.   On: 04/26/2016 00:51   Ct Maxillofacial Wo Cm  04/26/2016  CLINICAL DATA:  Fall tonight with facial bruising. EXAM: CT HEAD WITHOUT CONTRAST CT MAXILLOFACIAL WITHOUT CONTRAST CT CERVICAL SPINE WITHOUT CONTRAST TECHNIQUE: Multidetector CT imaging of the head, cervical spine, and maxillofacial structures were performed using the standard protocol without intravenous contrast. Multiplanar CT image reconstructions of the cervical spine and maxillofacial structures were  also generated. COMPARISON:  Head and cervical spine CT 06/20/2015 FINDINGS: CT HEAD FINDINGS No acute intracranial hemorrhage. Generalized atrophy and chronic small vessel ischemia again seen. Prior infarcts in the right parietal and left frontoparietal lobes with encephalomalacia is unchanged. No subdural fluid collection. Bilateral basal gangliar calcifications. Atherosclerosis of skullbase vasculature. No calvarial fracture. CT MAXILLOFACIAL FINDINGS No facial bone fracture. The orbits and globes are intact. The nasal bone, mandibles, zygomatic arches and pterygoid plates are intact. Paranasal sinuses are well-aerated without fluid level. Patient is edentulous. Submental soft tissue edema. CT CERVICAL SPINE FINDINGS Patient motion resulted in repeat image acquisition. No acute fracture or subluxation. There is straightening of normal lordosis. Disc space narrowing at C5-C6 and C6-C7 with endplate spurring. Multifocal facet arthropathy throughout. The dens is intact. There are no jumped or perched facets. No prevertebral soft tissue edema. 1.6 cm hypodense nodule in the right lobe of the thyroid gland is unchanged dating back to the CT 06/10/2014. IMPRESSION: 1. No acute intracranial abnormality. Encephalomalacia from remote prior infarcts unchanged. 2. No facial bone fracture.  Submental edema. 3. Degenerative change throughout  cervical spine without acute fracture or subluxation. Electronically Signed   By: Jeb Levering M.D.   On: 04/26/2016 00:51   I have personally reviewed and evaluated these images and lab results as part of my medical decision-making.   EKG Interpretation None      MDM   Final diagnoses:  Facial contusion, initial encounter  Shoulder contusion, right, initial encounter    Patient presents to the ER for evaluation after mechanical fall. Patient reports tripping while walking to the bathroom. She did land on her chin and chest area. Patient had large contusion and hematoma to the chin and submental area as well as right shoulder and anterior chest. Patient not complaining of any back pain, chest pain, shortness of breath. She is able to move both hips without any pain and there is no deformity. Patient had CT head, cervical spine, facial bones performed because of Coumadin use and significant facial trauma. CT scans, however, did not show any acute abnormality other than the external swelling. Patient also had x-ray right shoulder, chest and pelvis performed. These were negative. Remainder of the examination was unremarkable and it is reassuring that the scans and x-rays are negative. Patient experiencing bruising and swelling secondary to Coumadin use. Will require rest, ice packs, occasional analgesia if needed.  INR is slightly elevated at 3.54. Patient will hold Coumadin for one day, then resume.  Orpah Greek, MD 04/26/16 0110  Orpah Greek, MD 04/26/16 [phone]

## 2016-04-25 NOTE — ED Notes (Signed)
Brought via EMS from [facility] for witnessed fall.  Reports slipping on floor.  Hematoma noted to chin, with bruising to right shoulder.  C collar placed by ems.  Hx of dementia.  Denies having any pain.

## 2016-04-26 ENCOUNTER — Emergency Department (HOSPITAL_COMMUNITY): Payer: Medicare Other

## 2016-04-26 DIAGNOSIS — S40011A Contusion of right shoulder, initial encounter: Secondary | ICD-10-CM | POA: Diagnosis not present

## 2016-04-26 LAB — CBC
HCT: 37.9 % (ref 36.0–46.0)
HEMOGLOBIN: 12.1 g/dL (ref 12.0–15.0)
MCH: 26 pg (ref 26.0–34.0)
MCHC: 31.9 g/dL (ref 30.0–36.0)
MCV: 81.3 fL (ref 78.0–100.0)
PLATELETS: 144 10*3/uL — AB (ref 150–400)
RBC: 4.66 MIL/uL (ref 3.87–5.11)
RDW: 14.4 % (ref 11.5–15.5)
WBC: 6.9 10*3/uL (ref 4.0–10.5)

## 2016-04-26 LAB — BASIC METABOLIC PANEL
Anion gap: 6 (ref 5–15)
BUN: 11 mg/dL (ref 6–20)
CO2: 22 mmol/L (ref 22–32)
Calcium: 9.2 mg/dL (ref 8.9–10.3)
Chloride: 103 mmol/L (ref 101–111)
Creatinine, Ser: 0.91 mg/dL (ref 0.44–1.00)
GFR, EST NON AFRICAN AMERICAN: 60 mL/min — AB (ref >60)
Glucose, Bld: 109 mg/dL — ABNORMAL HIGH (ref 65–99)
POTASSIUM: 3.4 mmol/L — AB (ref 3.5–5.1)
SODIUM: 131 mmol/L — AB (ref 135–145)

## 2016-04-26 LAB — PROTIME-INR
INR: 3.54 — AB (ref 0.00–1.49)
PROTHROMBIN TIME: 34.6 s — AB (ref 11.6–15.2)

## 2016-04-26 NOTE — Discharge Instructions (Signed)
Skip Coumadin for 1 day, then restart as usual.  Contusion A contusion is a deep bruise. Contusions are the result of a blunt injury to tissues and muscle fibers under the skin. The injury causes bleeding under the skin. The skin overlying the contusion may turn blue, purple, or yellow. Minor injuries will give you a painless contusion, but more severe contusions may stay painful and swollen for a few weeks.  CAUSES  This condition is usually caused by a blow, trauma, or direct force to an area of the body. SYMPTOMS  Symptoms of this condition include:  Swelling of the injured area.  Pain and tenderness in the injured area.  Discoloration. The area may have redness and then turn blue, purple, or yellow. DIAGNOSIS  This condition is diagnosed based on a physical exam and medical history. An X-ray, CT scan, or MRI may be needed to determine if there are any associated injuries, such as broken bones (fractures). TREATMENT  Specific treatment for this condition depends on what area of the body was injured. In general, the best treatment for a contusion is resting, icing, applying pressure to (compression), and elevating the injured area. This is often called the RICE strategy. Over-the-counter anti-inflammatory medicines may also be recommended for pain control.  HOME CARE INSTRUCTIONS   Rest the injured area.  If directed, apply ice to the injured area:  Put ice in a plastic bag.  Place a towel between your skin and the bag.  Leave the ice on for 20 minutes, 2-3 times per day.  If directed, apply light compression to the injured area using an elastic bandage. Make sure the bandage is not wrapped too tightly. Remove and reapply the bandage as directed by your health care provider.  If possible, raise (elevate) the injured area above the level of your heart while you are sitting or lying down.  Take over-the-counter and prescription medicines only as told by your health care  provider. SEEK MEDICAL CARE IF:  Your symptoms do not improve after several days of treatment.  Your symptoms get worse.  You have difficulty moving the injured area. SEEK IMMEDIATE MEDICAL CARE IF:   You have severe pain.  You have numbness in a hand or foot.  Your hand or foot turns pale or cold.   This information is not intended to replace advice given to you by your health care provider. Make sure you discuss any questions you have with your health care provider.   Document Released: 08/25/2005 Document Revised: 08/06/2015 Document Reviewed: 04/02/2015 Elsevier Interactive Patient Education [employer].

## 2016-04-26 NOTE — ED Notes (Signed)
Report given to [place] at [facility].  Leaving with [facility] at this time.

## 2016-04-27 ENCOUNTER — Encounter: Payer: Self-pay | Admitting: Vascular Surgery

## 2016-05-03 ENCOUNTER — Encounter (HOSPITAL_COMMUNITY): Payer: Self-pay | Admitting: Emergency Medicine

## 2016-05-03 ENCOUNTER — Emergency Department (HOSPITAL_COMMUNITY)
Admission: EM | Admit: 2016-05-03 | Discharge: 2016-05-04 | Disposition: A | Discharge status: Home / Self Care | Payer: Medicare Other | Attending: Emergency Medicine | Admitting: Emergency Medicine

## 2016-05-03 DIAGNOSIS — Y939 Activity, unspecified: Secondary | ICD-10-CM | POA: Diagnosis not present

## 2016-05-03 DIAGNOSIS — Y999 Unspecified external cause status: Secondary | ICD-10-CM | POA: Insufficient documentation

## 2016-05-03 DIAGNOSIS — Z7982 Long term (current) use of aspirin: Secondary | ICD-10-CM | POA: Insufficient documentation

## 2016-05-03 DIAGNOSIS — Z8542 Personal history of malignant neoplasm of other parts of uterus: Secondary | ICD-10-CM | POA: Insufficient documentation

## 2016-05-03 DIAGNOSIS — W19XXXA Unspecified fall, initial encounter: Secondary | ICD-10-CM

## 2016-05-03 DIAGNOSIS — W1830XA Fall on same level, unspecified, initial encounter: Secondary | ICD-10-CM | POA: Insufficient documentation

## 2016-05-03 DIAGNOSIS — J45909 Unspecified asthma, uncomplicated: Secondary | ICD-10-CM | POA: Diagnosis not present

## 2016-05-03 DIAGNOSIS — Z7901 Long term (current) use of anticoagulants: Secondary | ICD-10-CM | POA: Insufficient documentation

## 2016-05-03 DIAGNOSIS — I10 Essential (primary) hypertension: Secondary | ICD-10-CM | POA: Insufficient documentation

## 2016-05-03 DIAGNOSIS — Z043 Encounter for examination and observation following other accident: Secondary | ICD-10-CM | POA: Diagnosis not present

## 2016-05-03 DIAGNOSIS — Z79899 Other long term (current) drug therapy: Secondary | ICD-10-CM | POA: Insufficient documentation

## 2016-05-03 DIAGNOSIS — Z87891 Personal history of nicotine dependence: Secondary | ICD-10-CM | POA: Diagnosis not present

## 2016-05-03 DIAGNOSIS — E785 Hyperlipidemia, unspecified: Secondary | ICD-10-CM | POA: Diagnosis not present

## 2016-05-03 DIAGNOSIS — Z8673 Personal history of transient ischemic attack (TIA), and cerebral infarction without residual deficits: Secondary | ICD-10-CM | POA: Insufficient documentation

## 2016-05-03 DIAGNOSIS — Y92198 Other place in other specified residential institution as the place of occurrence of the external cause: Secondary | ICD-10-CM | POA: Insufficient documentation

## 2016-05-03 DIAGNOSIS — F418 Other specified anxiety disorders: Secondary | ICD-10-CM | POA: Diagnosis not present

## 2016-05-03 DIAGNOSIS — I25119 Atherosclerotic heart disease of native coronary artery with unspecified angina pectoris: Secondary | ICD-10-CM | POA: Insufficient documentation

## 2016-05-03 NOTE — ED Notes (Signed)
Pt in EMS after fall at facility. Pt takes thinners. Denies any injury, states she is just a little sore. Pt ambulatory with walker. Pt has old bruising all over body, per EMS and pt this is from her old fall a few days ago. A/OX4

## 2016-05-03 NOTE — ED Provider Notes (Signed)
CSN: [id number]     Arrival date & time 05/03/16  2030 History   First MD Initiated Contact with Patient 05/03/16 2040     Chief Complaint  Patient presents with  . Fall    HPI   Cindy [name] is an 75 y.o. female with history of CAD, stroke, afib (on Coumadin), HTN, HLD who presents to the ED for evaluation after mechanical fall at her assisted living facility. She states she was going to the rest room, went to sit down, and as she was grabbing onto the railing/towel rack the rack came off the wall and she fell straight down onto her bottom. She states she feels a little sore right now but denies pain. She states she did not hit her head or lose consciousness. She states she has no weakness, numbness, tingling. Denies chest pain or difficulty breathing. She states she feels well and declines pain meds at this time. Pt was seen on 04/25/16 after a mechanical fall and does have residual bruising from head to toe from that fall. CT head, c-spine, and maxillofacial as well as x-rays of her chest and pelvis were negative at that time.   Past Medical History  Diagnosis Date  . Asthma   . Bronchitis   . Hypertension   . Recurrent upper respiratory infection (URI)   . GERD (gastroesophageal reflux disease)   . Depression   . Anxiety   . Dysrhythmia     atrial fibrillation  . Chest pain   . 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 Barrett's esophagus   . Angina pectoris ([place])   . Coronary artery disease     right carotid occlusion   . Stroke [facility])     2007, after left endarterectomy  . Hyperlipidemia    Past Surgical History  Procedure Laterality Date  . Abdominal hysterectomy    . 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 Esophagogastroduodenoscopy  12/01/2011    Procedure: ESOPHAGOGASTRODUODENOSCOPY (EGD);  Surgeon: Lear Ng, MD;  Location: [facility];  Service: Endoscopy;  Laterality: N/A;  . Colonoscopy  12/01/2011    Procedure: COLONOSCOPY;   Surgeon: Lear Ng, MD;  Location: [facility];  Service: Endoscopy;  Laterality: N/A;  . Hot hemostasis  12/01/2011    Procedure: HOT HEMOSTASIS (ARGON PLASMA COAGULATION/BICAP);  Surgeon: Lear Ng, MD;  Location: [facility];  Service: Endoscopy;  Laterality: N/A;  . Carotid endarterectomy      left, complicated by stroke  . Cholecystectomy      incidental at time of colectomy  . Colonoscopy  10/18/2012    Procedure: COLONOSCOPY;  Surgeon: Lear Ng, MD;  Location: [facility];  Service: Endoscopy;  Laterality: N/A;  colonic dilation  . Balloon dilation  10/18/2012    Procedure: BALLOON DILATION;  Surgeon: Lear Ng, MD;  Location: [facility];  Service: Endoscopy;  Laterality: N/A;  . Cardiac catheterization     Family History  Problem Relation Age of Onset  . Cancer Sister     ovarian  . Heart disease Mother   . Cancer Mother   . Heart attack Father    Social History  Substance Use Topics  . Smoking status: Former Smoker    Quit date: 04/09/2003  . Smokeless tobacco: Never Used  . Alcohol Use: No   OB History    No data available     Review of Systems  All  other systems reviewed and are negative.     Allergies  Review of patient's allergies indicates no known allergies.  Home Medications   Prior to Admission medications   Medication Sig Start Date End Date Taking? Authorizing Provider  acetaminophen (TYLENOL) 325 MG tablet Take 650 mg by mouth every evening. For pain    Historical Provider, MD  acetaminophen (TYLENOL) 325 MG tablet Take 650 mg by mouth every 4 (four) hours as needed for mild pain.    Historical Provider, MD  albuterol (PROVENTIL HFA;VENTOLIN HFA) 108 (90 BASE) MCG/ACT inhaler Inhale 2 puffs into the lungs every 6 (six) hours as needed for shortness of breath.     Historical Provider, MD  alendronate (FOSAMAX) 70 MG tablet Take 70 mg by mouth every Monday. Take with a full glass of water on an empty  stomach.    Historical Provider, MD  aspirin 81 MG chewable tablet Chew 81 mg by mouth daily.     Historical Provider, MD  baclofen (LIORESAL) 10 MG tablet Take 10 mg by mouth 2 (two) times daily as needed for muscle spasms.    Historical Provider, MD  budesonide-formoterol (SYMBICORT) 160-4.5 MCG/ACT inhaler Inhale 2 puffs into the lungs 2 (two) times daily.    Historical Provider, MD  calcium citrate-vitamin D 200-200 MG-UNIT TABS Take 1 tablet by mouth daily.     Historical Provider, MD  Chloroxylenol-Zinc Oxide (BAZA EX) Apply 1 application topically 2 (two) times daily as needed (affected areas on buttocks/sacral.).    Historical Provider, MD  cholecalciferol (VITAMIN D) 1000 UNITS tablet Take 1,000 Units by mouth daily.    Historical Provider, MD  clonazePAM (KLONOPIN) 0.5 MG tablet Take 0.5 mg by mouth 3 (three) times daily.     Historical Provider, MD  cyanocobalamin 500 MCG tablet Take 500 mcg by mouth daily.    Historical Provider, MD  ferrous sulfate 325 (65 FE) MG tablet Take 325 mg by mouth daily with breakfast.     Historical Provider, MD  fluticasone (FLONASE) 50 MCG/ACT nasal spray Place 2 sprays into the nose daily.     Historical Provider, MD  guaiFENesin (ROBITUSSIN) 100 MG/5ML liquid Take 200 mg by mouth every 6 (six) hours as needed for cough. For cough    Historical Provider, MD  isosorbide mononitrate (IMDUR) 30 MG 24 hr tablet Take 30 mg by mouth daily.    Historical Provider, MD  loratadine (CLARITIN) 10 MG tablet Take 10 mg by mouth daily.    Historical Provider, MD  losartan (COZAAR) 50 MG tablet Take 50 mg by mouth every evening.    Historical Provider, MD  Melatonin 3 MG CAPS Take 3 mg by mouth at bedtime.     Historical Provider, MD  Mineral Oil Heavy OIL Place 1 drop into the right ear every Monday.     Historical Provider, MD  nitroGLYCERIN (NITROSTAT) 0.4 MG SL tablet Place 0.4 mg under the tongue every 5 (five) minutes as needed for chest pain. For chest pain  01/17/14   Jettie Booze, MD  omeprazole (PRILOSEC) 20 MG capsule Take 40 mg by mouth daily.     Historical Provider, MD  phenol (QC SORE THROAT SPRAY) 1.4 % LIQD Use as directed 1 spray in the mouth or throat every 2 (two) hours as needed for throat irritation / pain.    Historical Provider, MD  polyethylene glycol (MIRALAX / GLYCOLAX) packet Take 17 g by mouth daily as needed for mild constipation.    Historical  Provider, MD  sertraline (ZOLOFT) 100 MG tablet Take 200 mg by mouth daily.     Historical Provider, MD  simvastatin (ZOCOR) 10 MG tablet Take 10 mg by mouth at bedtime. 05/29/15   Historical Provider, MD  sodium chloride (OCEAN) 0.65 % SOLN nasal spray Place 2 sprays into the nose every 4 (four) hours. Scheduled while awake only    Historical Provider, MD  tiotropium (SPIRIVA) 18 MCG inhalation capsule Place 18 mcg into inhaler and inhale daily.    Historical Provider, MD  traZODone (DESYREL) 150 MG tablet Take 150 mg by mouth at bedtime. 06/06/15   Historical Provider, MD  triamcinolone cream (KENALOG) 0.1 % Apply 1 application topically 3 (three) times daily as needed (hand and heels .).    Historical Provider, MD  warfarin (COUMADIN) 4 MG tablet Take 4-6 mg by mouth daily. Take 4mg  daily, on Sunday take an addition 2mg  tablet to equal 6mg .    Historical Provider, MD   BP 120/68 mmHg  Pulse 64  Temp(Src) 98.3 F (36.8 C) (Oral)  Resp 18  SpO2 100% Physical Exam  Constitutional: She is oriented to person, place, and time.  HENT:  Right Ear: External ear normal.  Left Ear: External ear normal.  Nose: Nose normal.  Mouth/Throat: Oropharynx is clear and moist. No oropharyngeal exudate.  Old bruising to chin and submental area. No facial tenderness or crepitus.  Edentulous No intraoral lesions  Eyes: Conjunctivae and EOM are normal. Pupils are equal, round, and reactive to light.  Neck: Normal range of motion. Neck supple.  No c-spine tenderness  Cardiovascular: Normal rate,  regular rhythm, normal heart sounds and intact distal pulses.   Pulmonary/Chest: Effort normal and breath sounds normal. No respiratory distress. She has no wheezes. She exhibits no tenderness.  No chest wall tenderness. Some old bruising noted.  Abdominal: Soft. Bowel sounds are normal. She exhibits no distension. There is no tenderness. There is no rebound and no guarding.  Musculoskeletal: She exhibits no edema.  No t-spine or L-spine tenderness. No stepoff or deformity. No hip or extremity tenderness or deformity Old bruising noted to lower right shin No LE edema  Neurological: She is alert and oriented to person, place, and time. No cranial nerve deficit.  Strength intact in bilateral UE and LE Normal finger to nose No pronator drift Ambulatory with steady gait to the restroom and back  Skin: Skin is warm and dry.  Psychiatric: She has a normal mood and affect.  Nursing note and vitals reviewed.   ED Course  Procedures (including critical care time) Labs Review Labs Reviewed - No data to display  Imaging Review No results found. I have personally reviewed and evaluated these images and lab results as part of my medical decision-making.   EKG Interpretation None      MDM   Final diagnoses:  Fall, initial encounter    Pt is an 75 y.o. female on coumadin who presents to the ED following a mechanical fall. No head injury or LOC. She has some old bruising on exam but no focal findings warranting imaging or further evaluation today. She is ambulatory with steady gait and she is hemodynamically stable. She declines pain meds in the ED. Will d/c back to facility with instructions for close PCP f/u. She was evaluated by Dr Eulis Foster as well who is in agreement with plan. ER return precautions given.    Anne Ng, PA-C 05/04/16 [id number]  Daleen Bo, MD 05/04/16 1330

## 2016-05-03 NOTE — ED Provider Notes (Signed)
  Face-to-face evaluation   History: Minor injuries today, when fell while in bathroom. Did not injure her head, face, neck or back in the fall today. She did injure her chin, and chest several days ago in a fall  Physical exam: Alert, elderly female. Mild tenderness left buttock without bruising or deformity. Normal range of motion, arms and legs. Subacute ecchymosis of chin, right chest and right breast. Walks with a somewhat shuffling gait, which is apparently her baseline.  Medical screening examination/treatment/procedure(s) were conducted as a shared visit with non-physician practitioner(s) and myself.  I personally evaluated the patient during the encounter  Daleen Bo, MD 05/04/16 1330

## 2016-05-03 NOTE — Discharge Instructions (Signed)
You were seen in the emergency room tonight for evaluation after a fall. Your exam was reassuring. Please follow up with your primary care provider. Return to the ER for new or worsening symptoms.

## 2016-05-05 ENCOUNTER — Encounter: Payer: Medicare Other | Admitting: Vascular Surgery

## 2016-05-20 ENCOUNTER — Encounter: Payer: Self-pay | Admitting: Interventional Cardiology

## 2016-06-23 ENCOUNTER — Ambulatory Visit: Payer: Medicare Other | Admitting: Interventional Cardiology

## 2016-06-24 ENCOUNTER — Ambulatory Visit: Payer: Medicare Other | Admitting: Interventional Cardiology

## 2016-08-09 ENCOUNTER — Encounter (HOSPITAL_COMMUNITY): Payer: Self-pay | Admitting: Emergency Medicine

## 2016-08-09 ENCOUNTER — Inpatient Hospital Stay (HOSPITAL_COMMUNITY)
Admission: EM | Admit: 2016-08-09 | Discharge: 2016-08-16 | DRG: 871 | Disposition: A | Discharge status: Skilled Nursing Facility | Payer: Medicare Other | Attending: Family Medicine | Admitting: Family Medicine

## 2016-08-09 ENCOUNTER — Emergency Department (HOSPITAL_COMMUNITY): Payer: Medicare Other

## 2016-08-09 DIAGNOSIS — Z90710 Acquired absence of both cervix and uterus: Secondary | ICD-10-CM | POA: Diagnosis not present

## 2016-08-09 DIAGNOSIS — R748 Abnormal levels of other serum enzymes: Secondary | ICD-10-CM | POA: Diagnosis present

## 2016-08-09 DIAGNOSIS — E876 Hypokalemia: Secondary | ICD-10-CM | POA: Diagnosis present

## 2016-08-09 DIAGNOSIS — Z7951 Long term (current) use of inhaled steroids: Secondary | ICD-10-CM

## 2016-08-09 DIAGNOSIS — I2510 Atherosclerotic heart disease of native coronary artery without angina pectoris: Secondary | ICD-10-CM | POA: Diagnosis present

## 2016-08-09 DIAGNOSIS — Z8249 Family history of ischemic heart disease and other diseases of the circulatory system: Secondary | ICD-10-CM | POA: Diagnosis not present

## 2016-08-09 DIAGNOSIS — I5021 Acute systolic (congestive) heart failure: Secondary | ICD-10-CM | POA: Diagnosis present

## 2016-08-09 DIAGNOSIS — I248 Other forms of acute ischemic heart disease: Secondary | ICD-10-CM | POA: Diagnosis present

## 2016-08-09 DIAGNOSIS — Z7901 Long term (current) use of anticoagulants: Secondary | ICD-10-CM

## 2016-08-09 DIAGNOSIS — F329 Major depressive disorder, single episode, unspecified: Secondary | ICD-10-CM | POA: Diagnosis present

## 2016-08-09 DIAGNOSIS — Z7982 Long term (current) use of aspirin: Secondary | ICD-10-CM | POA: Diagnosis not present

## 2016-08-09 DIAGNOSIS — R1310 Dysphagia, unspecified: Secondary | ICD-10-CM | POA: Diagnosis present

## 2016-08-09 DIAGNOSIS — F419 Anxiety disorder, unspecified: Secondary | ICD-10-CM | POA: Diagnosis present

## 2016-08-09 DIAGNOSIS — Z79899 Other long term (current) drug therapy: Secondary | ICD-10-CM

## 2016-08-09 DIAGNOSIS — E871 Hypo-osmolality and hyponatremia: Secondary | ICD-10-CM | POA: Diagnosis present

## 2016-08-09 DIAGNOSIS — R0600 Dyspnea, unspecified: Secondary | ICD-10-CM | POA: Diagnosis not present

## 2016-08-09 DIAGNOSIS — J189 Pneumonia, unspecified organism: Secondary | ICD-10-CM | POA: Diagnosis present

## 2016-08-09 DIAGNOSIS — I25119 Atherosclerotic heart disease of native coronary artery with unspecified angina pectoris: Secondary | ICD-10-CM | POA: Diagnosis not present

## 2016-08-09 DIAGNOSIS — I4891 Unspecified atrial fibrillation: Secondary | ICD-10-CM | POA: Diagnosis present

## 2016-08-09 DIAGNOSIS — I5023 Acute on chronic systolic (congestive) heart failure: Secondary | ICD-10-CM | POA: Diagnosis not present

## 2016-08-09 DIAGNOSIS — R0602 Shortness of breath: Secondary | ICD-10-CM | POA: Diagnosis not present

## 2016-08-09 DIAGNOSIS — J45901 Unspecified asthma with (acute) exacerbation: Secondary | ICD-10-CM | POA: Diagnosis present

## 2016-08-09 DIAGNOSIS — E785 Hyperlipidemia, unspecified: Secondary | ICD-10-CM | POA: Diagnosis present

## 2016-08-09 DIAGNOSIS — Z87891 Personal history of nicotine dependence: Secondary | ICD-10-CM | POA: Diagnosis not present

## 2016-08-09 DIAGNOSIS — I110 Hypertensive heart disease with heart failure: Secondary | ICD-10-CM | POA: Diagnosis present

## 2016-08-09 DIAGNOSIS — J45909 Unspecified asthma, uncomplicated: Secondary | ICD-10-CM | POA: Diagnosis present

## 2016-08-09 DIAGNOSIS — Y95 Nosocomial condition: Secondary | ICD-10-CM | POA: Diagnosis present

## 2016-08-09 DIAGNOSIS — I10 Essential (primary) hypertension: Secondary | ICD-10-CM | POA: Diagnosis not present

## 2016-08-09 DIAGNOSIS — Z8673 Personal history of transient ischemic attack (TIA), and cerebral infarction without residual deficits: Secondary | ICD-10-CM

## 2016-08-09 DIAGNOSIS — R791 Abnormal coagulation profile: Secondary | ICD-10-CM | POA: Diagnosis present

## 2016-08-09 DIAGNOSIS — K219 Gastro-esophageal reflux disease without esophagitis: Secondary | ICD-10-CM | POA: Diagnosis present

## 2016-08-09 DIAGNOSIS — R1314 Dysphagia, pharyngoesophageal phase: Secondary | ICD-10-CM

## 2016-08-09 DIAGNOSIS — R339 Retention of urine, unspecified: Secondary | ICD-10-CM | POA: Diagnosis present

## 2016-08-09 DIAGNOSIS — A419 Sepsis, unspecified organism: Secondary | ICD-10-CM | POA: Diagnosis present

## 2016-08-09 DIAGNOSIS — E878 Other disorders of electrolyte and fluid balance, not elsewhere classified: Secondary | ICD-10-CM | POA: Diagnosis not present

## 2016-08-09 DIAGNOSIS — R2689 Other abnormalities of gait and mobility: Secondary | ICD-10-CM

## 2016-08-09 LAB — TROPONIN I: Troponin I: 0.08 ng/mL (ref <0.03)

## 2016-08-09 LAB — COMPREHENSIVE METABOLIC PANEL
ALK PHOS: 93 U/L (ref 38–126)
ALT: 80 U/L — AB (ref 14–54)
ANION GAP: 10 (ref 5–15)
AST: 100 U/L — ABNORMAL HIGH (ref 15–41)
Albumin: 3.1 g/dL — ABNORMAL LOW (ref 3.5–5.0)
BUN: 23 mg/dL — ABNORMAL HIGH (ref 6–20)
CALCIUM: 10.1 mg/dL (ref 8.9–10.3)
CO2: 24 mmol/L (ref 22–32)
CREATININE: 0.96 mg/dL (ref 0.44–1.00)
Chloride: 98 mmol/L — ABNORMAL LOW (ref 101–111)
GFR, EST NON AFRICAN AMERICAN: 56 mL/min — AB (ref >60)
Glucose, Bld: 96 mg/dL (ref 65–99)
Potassium: 3.3 mmol/L — ABNORMAL LOW (ref 3.5–5.1)
SODIUM: 132 mmol/L — AB (ref 135–145)
TOTAL PROTEIN: 6.6 g/dL (ref 6.5–8.1)
Total Bilirubin: 1.2 mg/dL (ref 0.3–1.2)

## 2016-08-09 LAB — CBC WITH DIFFERENTIAL/PLATELET
BASOS ABS: 0 10*3/uL (ref 0.0–0.1)
BASOS PCT: 0 %
EOS ABS: 0 10*3/uL (ref 0.0–0.7)
Eosinophils Relative: 0 %
HEMATOCRIT: 32.3 % — AB (ref 36.0–46.0)
HEMOGLOBIN: 10.6 g/dL — AB (ref 12.0–15.0)
Lymphocytes Relative: 4 %
Lymphs Abs: 0.6 10*3/uL — ABNORMAL LOW (ref 0.7–4.0)
MCH: 26.6 pg (ref 26.0–34.0)
MCHC: 32.8 g/dL (ref 30.0–36.0)
MCV: 81.2 fL (ref 78.0–100.0)
MONOS PCT: 7 %
Monocytes Absolute: 1.1 10*3/uL — ABNORMAL HIGH (ref 0.1–1.0)
NEUTROS ABS: 14.1 10*3/uL — AB (ref 1.7–7.7)
NEUTROS PCT: 89 %
Platelets: 270 10*3/uL (ref 150–400)
RBC: 3.98 MIL/uL (ref 3.87–5.11)
RDW: 15.2 % (ref 11.5–15.5)
WBC: 15.8 10*3/uL — AB (ref 4.0–10.5)

## 2016-08-09 LAB — I-STAT CG4 LACTIC ACID, ED: Lactic Acid, Venous: 1.18 mmol/L (ref 0.5–1.9)

## 2016-08-09 LAB — BRAIN NATRIURETIC PEPTIDE: B NATRIURETIC PEPTIDE 5: 339.5 pg/mL — AB (ref 0.0–100.0)

## 2016-08-09 LAB — BASIC METABOLIC PANEL
BUN: 23 mg/dL — AB (ref 4–21)
Creatinine: 1 mg/dL (ref 0.5–1.1)
Glucose: 96 mg/dL
Potassium: 3.3 mmol/L — AB (ref 3.4–5.3)
Sodium: 132 mmol/L — AB (ref 137–147)

## 2016-08-09 LAB — CBC AND DIFFERENTIAL
HEMATOCRIT: 32 % — AB (ref 36–46)
Hemoglobin: 10.6 g/dL — AB (ref 12.0–16.0)
PLATELETS: 270 10*3/uL (ref 150–399)
WBC: 15.8 10*3/mL

## 2016-08-09 LAB — HEPATIC FUNCTION PANEL
ALK PHOS: 93 U/L (ref 25–125)
ALT: 80 U/L — AB (ref 7–35)
AST: 100 U/L — AB (ref 13–35)
BILIRUBIN, TOTAL: 1.2 mg/dL

## 2016-08-09 LAB — PROTIME-INR: INR: >10

## 2016-08-09 MED ORDER — IPRATROPIUM-ALBUTEROL 0.5-2.5 (3) MG/3ML IN SOLN
3.0000 mL | Freq: Four times a day (QID) | RESPIRATORY_TRACT | Status: DC
  Administered 2016-08-10 (×4): 3 mL via RESPIRATORY_TRACT
  Filled 2016-08-09 (×4): qty 3

## 2016-08-09 MED ORDER — GUAIFENESIN 100 MG/5ML PO SOLN
200.0000 mg | Freq: Four times a day (QID) | ORAL | Status: DC | PRN
  Administered 2016-08-10 – 2016-08-16 (×7): 200 mg via ORAL
  Filled 2016-08-09 (×7): qty 10

## 2016-08-09 MED ORDER — ARFORMOTEROL TARTRATE 15 MCG/2ML IN NEBU
15.0000 ug | INHALATION_SOLUTION | Freq: Two times a day (BID) | RESPIRATORY_TRACT | Status: DC
  Administered 2016-08-10 – 2016-08-16 (×12): 15 ug via RESPIRATORY_TRACT
  Filled 2016-08-09 (×13): qty 2

## 2016-08-09 MED ORDER — CEFEPIME HCL 1 G IJ SOLR
1.0000 g | Freq: Two times a day (BID) | INTRAMUSCULAR | Status: AC
  Administered 2016-08-10 – 2016-08-11 (×4): 1 g via INTRAVENOUS
  Filled 2016-08-09 (×4): qty 1

## 2016-08-09 MED ORDER — PHYTONADIONE 5 MG PO TABS
5.0000 mg | ORAL_TABLET | Freq: Once | ORAL | Status: AC
  Administered 2016-08-09: 5 mg via ORAL
  Filled 2016-08-09 (×2): qty 1

## 2016-08-09 MED ORDER — METHYLPREDNISOLONE SODIUM SUCC 125 MG IJ SOLR
125.0000 mg | Freq: Once | INTRAMUSCULAR | Status: AC
  Administered 2016-08-09: 125 mg via INTRAVENOUS
  Filled 2016-08-09: qty 2

## 2016-08-09 MED ORDER — NITROGLYCERIN 0.4 MG SL SUBL: 0.4000 mg | SUBLINGUAL_TABLET | SUBLINGUAL | Status: DC | PRN

## 2016-08-09 MED ORDER — WARFARIN - PHARMACIST DOSING INPATIENT: Freq: Every day | Status: DC

## 2016-08-09 MED ORDER — SIMVASTATIN 10 MG PO TABS
10.0000 mg | ORAL_TABLET | Freq: Every day | ORAL | Status: DC
  Administered 2016-08-10 – 2016-08-15 (×7): 10 mg via ORAL
  Filled 2016-08-09 (×7): qty 1

## 2016-08-09 MED ORDER — ACETAMINOPHEN 325 MG PO TABS
650.0000 mg | ORAL_TABLET | Freq: Four times a day (QID) | ORAL | Status: DC | PRN
  Administered 2016-08-10 – 2016-08-11 (×2): 650 mg via ORAL
  Filled 2016-08-09 (×2): qty 2

## 2016-08-09 MED ORDER — ISOSORBIDE MONONITRATE ER 30 MG PO TB24
30.0000 mg | ORAL_TABLET | Freq: Every day | ORAL | Status: DC
  Administered 2016-08-10 – 2016-08-16 (×7): 30 mg via ORAL
  Filled 2016-08-09 (×8): qty 1

## 2016-08-09 MED ORDER — PANTOPRAZOLE SODIUM 40 MG PO TBEC
40.0000 mg | DELAYED_RELEASE_TABLET | Freq: Every day | ORAL | Status: DC
  Administered 2016-08-10 – 2016-08-16 (×7): 40 mg via ORAL
  Filled 2016-08-09 (×7): qty 1

## 2016-08-09 MED ORDER — ONDANSETRON HCL 4 MG PO TABS: 4.0000 mg | ORAL_TABLET | Freq: Four times a day (QID) | ORAL | Status: DC | PRN

## 2016-08-09 MED ORDER — VITAMIN B-12 1000 MCG PO TABS
500.0000 ug | ORAL_TABLET | Freq: Every day | ORAL | Status: DC
Start: 2016-08-10 — End: 2016-08-16
  Administered 2016-08-10 – 2016-08-16 (×7): 500 ug via ORAL
  Filled 2016-08-09 (×7): qty 1

## 2016-08-09 MED ORDER — ACETAMINOPHEN 650 MG RE SUPP: 650.0000 mg | Freq: Four times a day (QID) | RECTAL | Status: DC | PRN

## 2016-08-09 MED ORDER — ALBUTEROL SULFATE (2.5 MG/3ML) 0.083% IN NEBU: 5.0000 mg | INHALATION_SOLUTION | Freq: Once | RESPIRATORY_TRACT | Status: DC

## 2016-08-09 MED ORDER — ONDANSETRON HCL 4 MG/2ML IJ SOLN
4.0000 mg | Freq: Four times a day (QID) | INTRAMUSCULAR | Status: DC | PRN
  Administered 2016-08-10: 4 mg via INTRAVENOUS
  Filled 2016-08-09: qty 2

## 2016-08-09 MED ORDER — VANCOMYCIN HCL IN DEXTROSE 750-5 MG/150ML-% IV SOLN
750.0000 mg | Freq: Two times a day (BID) | INTRAVENOUS | Status: AC
  Administered 2016-08-10 – 2016-08-11 (×4): 750 mg via INTRAVENOUS
  Filled 2016-08-09 (×4): qty 150

## 2016-08-09 MED ORDER — VANCOMYCIN HCL IN DEXTROSE 1-5 GM/200ML-% IV SOLN
1000.0000 mg | Freq: Once | INTRAVENOUS | Status: AC
  Administered 2016-08-09: 1000 mg via INTRAVENOUS
  Filled 2016-08-09: qty 200

## 2016-08-09 MED ORDER — PREDNISONE 50 MG PO TABS
60.0000 mg | ORAL_TABLET | Freq: Every day | ORAL | Status: DC
  Administered 2016-08-10 – 2016-08-12 (×3): 60 mg via ORAL
  Filled 2016-08-09 (×3): qty 1

## 2016-08-09 MED ORDER — ALBUTEROL SULFATE (2.5 MG/3ML) 0.083% IN NEBU
5.0000 mg | INHALATION_SOLUTION | Freq: Once | RESPIRATORY_TRACT | Status: AC
  Administered 2016-08-09: 5 mg via RESPIRATORY_TRACT
  Filled 2016-08-09: qty 6

## 2016-08-09 MED ORDER — PHENOL 1.4 % MT LIQD
5.0000 | OROMUCOSAL | Status: DC | PRN
  Filled 2016-08-09: qty 177

## 2016-08-09 MED ORDER — BUDESONIDE 0.5 MG/2ML IN SUSP
0.5000 mg | Freq: Two times a day (BID) | RESPIRATORY_TRACT | Status: DC
  Administered 2016-08-10 – 2016-08-16 (×13): 0.5 mg via RESPIRATORY_TRACT
  Filled 2016-08-09 (×14): qty 2

## 2016-08-09 MED ORDER — IPRATROPIUM-ALBUTEROL 0.5-2.5 (3) MG/3ML IN SOLN
3.0000 mL | RESPIRATORY_TRACT | Status: DC | PRN
  Administered 2016-08-10: 3 mL via RESPIRATORY_TRACT
  Filled 2016-08-09: qty 3

## 2016-08-09 MED ORDER — LORATADINE 10 MG PO TABS
10.0000 mg | ORAL_TABLET | Freq: Every day | ORAL | Status: DC
  Administered 2016-08-10 – 2016-08-16 (×7): 10 mg via ORAL
  Filled 2016-08-09 (×7): qty 1

## 2016-08-09 MED ORDER — POTASSIUM CHLORIDE 10 MEQ/100ML IV SOLN
10.0000 meq | INTRAVENOUS | Status: AC
  Administered 2016-08-10 (×3): 10 meq via INTRAVENOUS
  Filled 2016-08-09 (×3): qty 100

## 2016-08-09 MED ORDER — LOSARTAN POTASSIUM 50 MG PO TABS
50.0000 mg | ORAL_TABLET | Freq: Every evening | ORAL | Status: DC
  Administered 2016-08-10 – 2016-08-15 (×7): 50 mg via ORAL
  Filled 2016-08-09 (×8): qty 1

## 2016-08-09 MED ORDER — ACETAMINOPHEN 325 MG PO TABS
650.0000 mg | ORAL_TABLET | Freq: Once | ORAL | Status: AC
  Administered 2016-08-09: 650 mg via ORAL
  Filled 2016-08-09: qty 2

## 2016-08-09 MED ORDER — SERTRALINE HCL 50 MG PO TABS: 200.0000 mg | ORAL_TABLET | Freq: Every day | ORAL | Status: DC

## 2016-08-09 MED ORDER — DEXTROSE 5 % IV SOLN
2.0000 g | Freq: Once | INTRAVENOUS | Status: AC
  Administered 2016-08-09: 2 g via INTRAVENOUS
  Filled 2016-08-09: qty 2

## 2016-08-09 MED ORDER — CLONAZEPAM 0.5 MG PO TABS
0.2500 mg | ORAL_TABLET | Freq: Two times a day (BID) | ORAL | Status: DC | PRN
  Administered 2016-08-10 – 2016-08-15 (×5): 0.25 mg via ORAL
  Filled 2016-08-09 (×6): qty 1

## 2016-08-09 MED ORDER — FLUTICASONE PROPIONATE 50 MCG/ACT NA SUSP
2.0000 | Freq: Every day | NASAL | Status: DC
  Administered 2016-08-10 – 2016-08-16 (×7): 2 via NASAL
  Filled 2016-08-09: qty 16

## 2016-08-09 MED ORDER — BACLOFEN 10 MG PO TABS
10.0000 mg | ORAL_TABLET | Freq: Two times a day (BID) | ORAL | Status: DC | PRN
Start: 2016-08-09 — End: 2016-08-16

## 2016-08-09 NOTE — ED Notes (Signed)
Hospitalist at bedside 

## 2016-08-09 NOTE — Progress Notes (Signed)
ANTICOAGULATION CONSULT NOTE - Initial Consult  Pharmacy Consult for Warfarin Indication: atrial fibrillation  No Known Allergies  Patient Measurements: Height: 5\' 1"  (154.9 cm) Weight: 159 lb (72.1 kg) IBW/kg (Calculated) : 47.8  Vital Signs: Temp: 100.5 F (38.1 C) (09/11 2227) Temp Source: Oral (09/11 2227) BP: 144/86 (09/11 2230) Pulse Rate: 102 (09/11 2232)  Labs:  Recent Labs  08/09/16 2029  HGB 10.6*  HCT 32.3*  PLT 270  LABPROT >90.0*  INR >10.00*  CREATININE 0.96  TROPONINI 0.08*    Estimated Creatinine Clearance: 45.3 mL/min (by C-G formula based on SCr of 0.96 mg/dL).   Medical History: Past Medical History:  Diagnosis Date  . Angina pectoris ([place])   . Anxiety   . Asthma   . Barrett's esophagus   . Bronchitis   . Cancer ([place])    uterian  . Chest pain   . Coronary artery disease    right carotid occlusion   . Depression   . Dysrhythmia    atrial fibrillation  . GERD (gastroesophageal reflux disease)   . Hyperlipidemia   . Hypertension   . Recurrent upper respiratory infection (URI)   . Stroke [facility])    2007, after left endarterectomy    Medications:  Scheduled:  . arformoterol  15 mcg Nebulization BID  . budesonide (PULMICORT) nebulizer solution  0.5 mg Nebulization BID  . cyanocobalamin  500 mcg Oral Daily  . fluticasone  2 spray Each Nare Daily  . ipratropium-albuterol  3 mL Nebulization QID  . isosorbide mononitrate  30 mg Oral Daily  . loratadine  10 mg Oral Daily  . losartan  50 mg Oral QPM  . pantoprazole  40 mg Oral Daily  . predniSONE  60 mg Oral Q breakfast  . sertraline  200 mg Oral Daily  . simvastatin  10 mg Oral QHS  . vancomycin  750 mg Intravenous Q12H  . Warfarin - Pharmacist Dosing Inpatient   Does not apply q1800   Infusions:  . ceFEPime (MAXIPIME) IV    . potassium chloride      Assessment:  Cindy [name] is a 75 y.o. female admitted on 08/09/2016 with pneumonia and known to pharmacy from Vancomycin &  Cefepime dosing.  PTA patient on Warfarin 4mg  daily except 6mg  on Sunday for AFib.  Last reported dose was taken today (9/11) @ 17:00  INR upon arrival in ED > 10  Vitamin K 5mg  PO x 1 given @ 23:47  Pharmacy consulted to dose warfarin  Goal of Therapy:  INR 2-3    Plan:  No warfarin tonight Check daily INR  Shaaron Golliday, [place]io [place], PharmD 08/09/2016,11:59 PM

## 2016-08-09 NOTE — ED Notes (Signed)
INR greater than 10 reported by lab. This was reported to EDP

## 2016-08-09 NOTE — ED Triage Notes (Signed)
Pt from [facility] with sob x 3 days. Per facility pt had a fever that started today that was 101. EMS is unsure if pt was given anything for the temperature. Pt has a productive cough. Per EMS pt has no wheezing.

## 2016-08-09 NOTE — ED Notes (Signed)
RN aware of need for urine.

## 2016-08-09 NOTE — ED Notes (Signed)
Bed: WA01 Expected date:  Expected time:  Means of arrival:  Comments: EMS 

## 2016-08-09 NOTE — Progress Notes (Signed)
Pharmacy Antibiotic Note  Cindy [name] is a 75 y.o. female admitted on 08/09/2016 with pneumonia.  Pharmacy has been consulted for Vancomycin and Cefepime dosing.  Plan: Vancomycin 1g IV x 1 ordered in the ED. Continue with Vancomycin 750mg  IV q12h. Plan for Vancomycin trough level at steady state. Goal trough level 15-20 mcg/mL. Cefepime 2g IV x 1 ordered in the ED. Continue with Cefepime 1g IV q12h. Monitor renal function, cultures, clinical course.  Height: 5\' 1"  (154.9 cm) Weight: 159 lb (72.1 kg) IBW/kg (Calculated) : 47.8  Temp (24hrs), Avg:99 F (37.2 C), Min:97.3 F (36.3 C), Max:100.7 F (38.2 C)   Recent Labs Lab 08/09/16 2029 08/09/16 2053  WBC 15.8*  --   CREATININE 0.96  --   LATICACIDVEN  --  1.18    Estimated Creatinine Clearance: 45.3 mL/min (by C-G formula based on SCr of 0.96 mg/dL).    No Known Allergies  Antimicrobials this admission: 9/11 >> Vancomycin >> 9/11 >> Cefepime >>  Dose adjustments this admission: --  Microbiology results: 9/11 BCx: sent 9/11 UCx: sent    Thank you for allowing pharmacy to be a part of this patient's care.   Lindell Spar, PharmD, BCPS Pager: [phone] 08/09/2016 9:09 PM

## 2016-08-09 NOTE — H&P (Signed)
History and Physical    Cindy [name] [id number] DOB: 04/13/1941 DOA: 08/09/2016  Referring MD/NP/PA: Dr. Zenia Resides PCP: [name], MD  Patient coming from: Assisted living facility  Chief Complaint: Couldn't breathe  HPI: Cindy [name] is a 75 y.o. female with medical history significant of CVA following carotid endarterectomy, atrial fibrillation on chronic anticoagulation, HTN, anxiety/depression, gerd; who presents with complaints of shortness of breath progressively worsening over the last 3 days. Patient is somewhat of a poor historian at this time. History mostly obtained from review of records. She was noted to have a productive cough with associated symptoms included shortness of breath, wheezing, nausea, vomiting, and diaphoresis. She was noted to have had a fever of 101F. Patient then treated with home inhalers without relief of symptoms.  ED Course: Upon admission to the emergency department patient was noted to be febrile to 100.8F, pulse of up to 137, respirations up to 28, O2 saturation as low as 91% on 2 L nasal cannula oxygen. Lab work revealed WBC 15.8, hemoglobin 10.6, sodium 132, potassium 3.3, chloride 98, BUN 23, INR >10, and troponin 0.08. Chest x-ray revealed a left lower lobe pneumonia with small left pleural effusion. Sepsis protocol initiated and patient was started on empiric antibiotics of vancomycin and cefepime. TRH called to admit.  Review of Systems: As per HPI otherwise 10 point review of systems negative.   Past Medical History:  Diagnosis Date  . Angina pectoris ([place])   . Anxiety   . Asthma   . Barrett's esophagus   . Bronchitis   . 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 Chest pain   . Coronary artery disease    right carotid occlusion   . Depression   . Dysrhythmia    atrial fibrillation  . GERD (gastroesophageal reflux disease)   . Hyperlipidemia   . Hypertension   . Recurrent upper respiratory infection (URI)   . Stroke [facility])    2007, after left  endarterectomy    Past Surgical History:  Procedure Laterality Date  . ABDOMINAL HYSTERECTOMY    . BALLOON DILATION  10/18/2012   Procedure: BALLOON DILATION;  Surgeon: Lear Ng, MD;  Location: [facility] ENDOSCOPY;  Service: Endoscopy;  Laterality: N/A;  . CARDIAC CATHETERIZATION    . CAROTID ENDARTERECTOMY     left, complicated by stroke  . CHOLECYSTECTOMY     incidental at time of colectomy  . COLONOSCOPY  12/01/2011   Procedure: COLONOSCOPY;  Surgeon: Lear Ng, MD;  Location: [facility] ENDOSCOPY;  Service: Endoscopy;  Laterality: N/A;  . COLONOSCOPY  10/18/2012   Procedure: COLONOSCOPY;  Surgeon: Lear Ng, MD;  Location: [facility] ENDOSCOPY;  Service: Endoscopy;  Laterality: N/A;  colonic dilation  . ESOPHAGOGASTRODUODENOSCOPY  12/01/2011   Procedure: ESOPHAGOGASTRODUODENOSCOPY (EGD);  Surgeon: Lear Ng, MD;  Location: [facility];  Service: Endoscopy;  Laterality: N/A;  . HOT HEMOSTASIS  12/01/2011   Procedure: HOT HEMOSTASIS (ARGON PLASMA COAGULATION/BICAP);  Surgeon: Lear Ng, MD;  Location: [facility];  Service: Endoscopy;  Laterality: N/A;  . 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 She has never used smokeless tobacco. She reports that she does not drink alcohol or use drugs.  No Known Allergies  Family History  Problem Relation Age of Onset  . Heart disease Mother   . Cancer Mother   . Heart attack Father   .  Cancer Sister     ovarian    Prior to Admission medications   Medication Sig Start Date End Date Taking? Authorizing Provider  acetaminophen (TYLENOL) 325 MG tablet Take 650 mg by mouth every evening. (CANNOT EXCEED A TOTAL OF 4 GRAMS OF TYLENOL FROM ALL SOURCES)   Yes Historical Provider, MD  acetaminophen (TYLENOL) 325 MG tablet Take 650 mg by mouth every 4 (four) hours as needed (for pain (not to be given within 4 hours of scheduled  acetaminophen. CANNOT EXCEED A TOTAL OF 4 GRAMS OF TYLENOL FROM ALL SOURCES)).   Yes Historical Provider, MD  albuterol (PROVENTIL HFA;VENTOLIN HFA) 108 (90 BASE) MCG/ACT inhaler Inhale 2 puffs into the lungs every 6 (six) hours as needed for shortness of breath.    Yes Historical Provider, MD  alendronate (FOSAMAX) 70 MG tablet Take 70 mg by mouth every Monday. Take with a full glass of water on an empty stomach.   Yes Historical Provider, MD  aspirin EC 81 MG tablet Take 81 mg by mouth daily.   Yes Historical Provider, MD  baclofen (LIORESAL) 10 MG tablet Take 10 mg by mouth 2 (two) times daily as needed for muscle spasms.   Yes Historical Provider, MD  budesonide-formoterol (SYMBICORT) 160-4.5 MCG/ACT inhaler Inhale 2 puffs into the lungs 2 (two) times daily.   Yes Historical Provider, MD  calcium citrate-vitamin D 200-200 MG-UNIT TABS Take 1 tablet by mouth daily.    Yes Historical Provider, MD  Chloroxylenol-Zinc Oxide (BAZA EX) Apply 1 application topically 2 (two) times daily as needed (affected areas on buttocks/sacral.).   Yes Historical Provider, MD  cholecalciferol (VITAMIN D) 1000 UNITS tablet Take 1,000 Units by mouth daily.   Yes Historical Provider, MD  clonazePAM (KLONOPIN) 0.5 MG tablet Take 0.25 mg by mouth 2 (two) times daily.    Yes Historical Provider, MD  cyanocobalamin 500 MCG tablet Take 500 mcg by mouth daily.   Yes Historical Provider, MD  ferrous sulfate 325 (65 FE) MG tablet Take 325 mg by mouth daily with breakfast.    Yes Historical Provider, MD  fluticasone (FLONASE) 50 MCG/ACT nasal spray Place 2 sprays into both nostrils daily.    Yes Historical Provider, MD  guaifenesin (ROBAFEN) 100 MG/5ML syrup Take 200 mg by mouth every 6 (six) hours as needed for cough.   Yes Historical Provider, MD  isosorbide mononitrate (IMDUR) 30 MG 24 hr tablet Take 30 mg by mouth daily.   Yes Historical Provider, MD  loratadine (CLARITIN) 10 MG tablet Take 10 mg by mouth daily.   Yes  Historical Provider, MD  losartan (COZAAR) 50 MG tablet Take 50 mg by mouth every evening.   Yes Historical Provider, MD  Melatonin 3 MG CAPS Take 3 mg by mouth at bedtime.    Yes Historical Provider, MD  Mineral Oil Heavy OIL Place 1 drop into the right ear every Monday.    Yes Historical Provider, MD  nitroGLYCERIN (NITROSTAT) 0.4 MG SL tablet Place 0.4 mg under the tongue every 5 (five) minutes as needed for chest pain (CALL 9-1-1 IF NO RELIEF AFTER THREE DOSES). For chest pain 01/17/14  Yes Jettie Booze, MD  omeprazole (PRILOSEC) 20 MG capsule Take 40 mg by mouth daily.    Yes Historical Provider, MD  phenol (QC SORE THROAT SPRAY) 1.4 % LIQD Use as directed 5 sprays in the mouth or throat every 2 (two) hours as needed for throat irritation / pain.    Yes Historical Provider, MD  polyethylene glycol (MIRALAX / GLYCOLAX) packet Take 17 g by mouth daily as needed for mild constipation.   Yes Historical Provider, MD  sertraline (ZOLOFT) 100 MG tablet Take 200 mg by mouth daily.    Yes Historical Provider, MD  simvastatin (ZOCOR) 10 MG tablet Take 10 mg by mouth at bedtime. 05/29/15  Yes Historical Provider, MD  sodium chloride (OCEAN) 0.65 % SOLN nasal spray Place 2 sprays into both nostrils every 4 (four) hours. Scheduled while awake only & as needed following nose bleed.   Yes Historical Provider, MD  tiotropium (SPIRIVA) 18 MCG inhalation capsule Place 18 mcg into inhaler and inhale daily.   Yes Historical Provider, MD  traZODone (DESYREL) 150 MG tablet Take 150 mg by mouth at bedtime. 06/06/15  Yes Historical Provider, MD  triamcinolone cream (KENALOG) 0.1 % Apply 1 application topically 2 (two) times daily as needed (for cracks on fingers/may keep at bedside).    Yes Historical Provider, MD  warfarin (COUMADIN) 1 MG tablet Take 2 mg by mouth every Sunday.   Yes Historical Provider, MD  warfarin (COUMADIN) 4 MG tablet Take 4 mg by mouth daily.    Yes Historical Provider, MD    Physical  Exam:    Constitutional: Elderly female who appears acutely ill able to follow simple commands Vitals:   08/09/16 2225 08/09/16 2227 08/09/16 2230 08/09/16 2232  BP:  (!) 142/114 144/86   Pulse: (!) 121 (!) 131 114 102  Resp: (!) 28 24 25 17   Temp:  100.5 F (38.1 C)    TempSrc:  Oral    SpO2: 95% 91% 96% 93%  Weight:  72.1 kg (159 lb)    Height:  5\' 1"  (1.549 m)     Eyes: PERRL, lids and conjunctivae normal ENMT: Mucous membranes are Dry. Posterior pharynx clear of any exudate or lesions Neck: normal, supple, no masses, no thyromegaly Respiratory: Tachypneic with expiratory wheezes appreciated. Patient speaking in shorten sentences. Cardiovascular: Tachycardic no murmurs / rubs / gallops. No extremity edema. 2+ pedal pulses. No carotid bruits.  Abdomen: no tenderness, no masses palpated. No hepatosplenomegaly. Bowel sounds positive.  Musculoskeletal: no clubbing / cyanosis. No joint deformity upper and lower extremities. Good ROM, no contractures. Normal muscle tone.  Skin: no rashes, lesions, ulcers. No induration Neurologic: CN 2-12 grossly intact. Sensation intact, DTR normal. Strength 5/5 in all 4.  Psychiatric: . Patient appears to be somewhat confused.      Labs on Admission: I have personally reviewed following labs and imaging studies  CBC:  Recent Labs Lab 08/09/16 2029  WBC 15.8*  NEUTROABS 14.1*  HGB 10.6*  HCT 32.3*  MCV 81.2  PLT [id number]   Basic Metabolic Panel:  Recent Labs Lab 08/09/16 2029  NA 132*  K 3.3*  CL 98*  CO2 24  GLUCOSE 96  BUN 23*  CREATININE 0.96  CALCIUM 10.1   GFR: Estimated Creatinine Clearance: 45.3 mL/min (by C-G formula based on SCr of 0.96 mg/dL). Liver Function Tests:  Recent Labs Lab 08/09/16 2029  AST 100*  ALT 80*  ALKPHOS 93  BILITOT 1.2  PROT 6.6  ALBUMIN 3.1*   No results for input(s): LIPASE, AMYLASE in the last 168 hours. No results for input(s): AMMONIA in the last 168 hours. Coagulation  Profile:  Recent Labs Lab 08/09/16 2029  INR >10.00*   Cardiac Enzymes:  Recent Labs Lab 08/09/16 2029  TROPONINI 0.08*   BNP (last 3 results) No results for input(s): PROBNP in the last  8760 hours. HbA1C: No results for input(s): HGBA1C in the last 72 hours. CBG: No results for input(s): GLUCAP in the last 168 hours. Lipid Profile: No results for input(s): CHOL, HDL, LDLCALC, TRIG, CHOLHDL, LDLDIRECT in the last 72 hours. Thyroid Function Tests: No results for input(s): TSH, T4TOTAL, FREET4, T3FREE, THYROIDAB in the last 72 hours. Anemia Panel: No results for input(s): VITAMINB12, FOLATE, FERRITIN, TIBC, IRON, RETICCTPCT in the last 72 hours. Urine analysis:    Component Value Date/Time   COLORURINE YELLOW 01/02/2015 1313   APPEARANCEUR CLOUDY (A) 01/02/2015 1313   LABSPEC 1.006 01/02/2015 1313   PHURINE 6.0 01/02/2015 1313   GLUCOSEU NEGATIVE 01/02/2015 1313   HGBUR NEGATIVE 01/02/2015 1313   BILIRUBINUR NEGATIVE 01/02/2015 1313   KETONESUR NEGATIVE 01/02/2015 1313   PROTEINUR NEGATIVE 01/02/2015 1313   UROBILINOGEN 0.2 01/02/2015 1313   NITRITE NEGATIVE 01/02/2015 1313   LEUKOCYTESUR NEGATIVE 01/02/2015 1313   Sepsis Labs: No results found for this or any previous visit (from the past 240 hour(s)).   Radiological Exams on Admission: Dg Chest 2 View  Result Date: 08/09/2016 CLINICAL DATA:  Cough, shortness of breath and fever. EXAM: CHEST  2 VIEW COMPARISON:  04/26/2016. FINDINGS: Stable enlarged cardiac silhouette. Interval patchy opacity in the left lower lobe. Diffuse peribronchial thickening and accentuation of the interstitial markings with progression. Interval minimal patchy opacity at the right lung base. Small left pleural effusion. IMPRESSION: 1. Left lower lobe pneumonia with a small left pleural effusion. 2. Minimal patchy atelectasis or pneumonia at the right lung base. 3. Moderate diffuse bronchitic changes. 4. Stable cardiomegaly. Electronically Signed    By: Claudie Revering M.D.   On: 08/09/2016 20:46    EKG: Independently reviewed. Atrial fibrillation  Assessment/Plan Sepsis secondary to healthcare associated pneumonia: Acute. Patient with a productive cough. . She was tachypneic and tachycardic with WBC 15.8.  X-ray showing left lower lobe pneumonia. - Admit to a telemetry bed - Follow-up cultures - Empiric antibiotics of vancomycin and cefepime - Tylenol prn fever   Asthma exacerbation: acute. - Continuous pulse oximetry with nasal cannula oxygen keep O2 sats greater than 92% - Budesonide and Brovana nebs - Duonebs q QID and prn SOB/Wheezing - Prednisone 60 mg oral daily - Mucinex  Atrial fibrillation with supratherapeutic INR: initial INR greater than 10. Chadsvasc score = 6 - Give potassium vitamin K 5 mg oral 1 dose now  - held Coumadin  Elevated troponins: Acute. Troponin 0.08 on admission suspect that this is likely a Type II NSTEMI.  - Trend cardiac troponin - repeat EKG in am  Coronary artery disease - Continue aspirin and isosorbide mononitrate  Essential hypertension - Continue losartan   Anxiety/depression - Continue Klonopin prn, Zoloft  Hyperlipidemia  - Continue simvastatin   Hypokalemia: initial potassium 3.3 - Give 30 mEq of potassium chloride IV 1 dose now - Continue to monitor and replace as needed  Hyponatremia:  Sodium 132 on admission suspect symptoms could be secondary to nausea and vomiting. - Gentle IV fluid hydration - Recheck BMP in a.m.   History CVA: Stable  GERD  - Pharmacy substitution of Protonix    DVT prophylaxis: SCD  Code Status: Full Family Communication:No family present at bedside   Disposition Plan: TBD Consults called:  Admission status: Inpatient   Norval Morton MD [facility] Pager [phone]  If 7PM-7AM, please contact night-coverage [email or web address] Password [facility]  08/09/2016, 11:09 PM

## 2016-08-09 NOTE — ED Provider Notes (Signed)
[facility] DEPT Provider Note   CSN: [id number] Arrival date & time: 08/09/16  1956     History   Chief Complaint Chief Complaint  Patient presents with  . Shortness of Breath    HPI Cindy [name] is a 75 y.o. female.  75 year old female presents with several days of increased cough and shortness of breath. Does have a history of asthma. Was febrile at the nursing home to 101 and was treated for this. Denies any anginal chest pain. No lower extremity edema. No abdominal discomfort. EMS was called and patient had wheezing was treated with albuterol. Some improvement of her symptoms.      Past Medical History:  Diagnosis Date  . Angina pectoris ([place])   . Anxiety   . Asthma   . Barrett's esophagus   . Bronchitis   . 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 Chest pain   . Coronary artery disease    right carotid occlusion   . Depression   . Dysrhythmia    atrial fibrillation  . GERD (gastroesophageal reflux disease)   . Hyperlipidemia   . Hypertension   . Recurrent upper respiratory infection (URI)   . Stroke [facility])    2007, after left endarterectomy    Patient Active Problem List   Diagnosis Date Noted  . Coronary atherosclerosis of native coronary artery 01/21/2014  . Obesity 01/17/2014  . Abdominal pain, generalized 10/18/2012  . GERD (gastroesophageal reflux disease)   . Depression   . Anxiety   . Hypertension   . Stroke ([place])   . Chest pain 01/07/2012  . Asthma 01/07/2012  . A-fib ([place]) 01/07/2012  . CVA (cerebral infarction) 01/07/2012  . Anemia 01/07/2012  . ANEMIA 07/29/2010  . BARRETTS ESOPHAGUS 06/04/2010    Past Surgical History:  Procedure Laterality Date  . ABDOMINAL HYSTERECTOMY    . BALLOON DILATION  10/18/2012   Procedure: BALLOON DILATION;  Surgeon: Lear Ng, MD;  Location: [facility] ENDOSCOPY;  Service: Endoscopy;  Laterality: N/A;  . CARDIAC CATHETERIZATION    . CAROTID ENDARTERECTOMY     left, complicated by stroke  . CHOLECYSTECTOMY      incidental at time of colectomy  . COLONOSCOPY  12/01/2011   Procedure: COLONOSCOPY;  Surgeon: Lear Ng, MD;  Location: [facility] ENDOSCOPY;  Service: Endoscopy;  Laterality: N/A;  . COLONOSCOPY  10/18/2012   Procedure: COLONOSCOPY;  Surgeon: Lear Ng, MD;  Location: [facility] ENDOSCOPY;  Service: Endoscopy;  Laterality: N/A;  colonic dilation  . ESOPHAGOGASTRODUODENOSCOPY  12/01/2011   Procedure: ESOPHAGOGASTRODUODENOSCOPY (EGD);  Surgeon: Lear Ng, MD;  Location: [facility];  Service: Endoscopy;  Laterality: N/A;  . HOT HEMOSTASIS  12/01/2011   Procedure: HOT HEMOSTASIS (ARGON PLASMA COAGULATION/BICAP);  Surgeon: Lear Ng, MD;  Location: [facility];  Service: Endoscopy;  Laterality: N/A;  . 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 Authorizing Provider  acetaminophen (TYLENOL) 325 MG tablet Take 650 mg by mouth every evening. And may take 650 mg every four hours as needed for pain (CANNOT EXCEED A TOTAL OF 4 GRAMS OF TYLENOL FROM ALL SOURCES)    Historical Provider, MD  albuterol (PROVENTIL HFA;VENTOLIN HFA) 108 (90 BASE) MCG/ACT inhaler Inhale 2 puffs into the lungs every 6 (six)  hours as needed for shortness of breath.     Historical Provider, MD  alendronate (FOSAMAX) 70 MG tablet Take 70 mg by mouth every Monday. Take with a full glass of water on an empty stomach.    Historical Provider, MD  aspirin 81 MG chewable tablet Chew 81 mg by mouth daily.     Historical Provider, MD  baclofen (LIORESAL) 10 MG tablet Take 10 mg by mouth 2 (two) times daily as needed for muscle spasms.    Historical Provider, MD  budesonide-formoterol (SYMBICORT) 160-4.5 MCG/ACT inhaler Inhale 2 puffs into the lungs 2 (two) times daily.    Historical Provider, MD  calcium citrate-vitamin D 200-200  MG-UNIT TABS Take 1 tablet by mouth daily.     Historical Provider, MD  Chloroxylenol-Zinc Oxide (BAZA EX) Apply 1 application topically 2 (two) times daily as needed (affected areas on buttocks/sacral.).    Historical Provider, MD  cholecalciferol (VITAMIN D) 1000 UNITS tablet Take 1,000 Units by mouth daily.    Historical Provider, MD  clonazePAM (KLONOPIN) 0.5 MG tablet Take 0.5 mg by mouth 3 (three) times daily.     Historical Provider, MD  cyanocobalamin 500 MCG tablet Take 500 mcg by mouth daily.    Historical Provider, MD  ferrous sulfate 325 (65 FE) MG tablet Take 325 mg by mouth daily with breakfast.     Historical Provider, MD  fluticasone (FLONASE) 50 MCG/ACT nasal spray Place 2 sprays into both nostrils daily.     Historical Provider, MD  guaifenesin (ROBAFEN) 100 MG/5ML syrup Take 200 mg by mouth every 6 (six) hours as needed for cough.    Historical Provider, MD  isosorbide mononitrate (IMDUR) 30 MG 24 hr tablet Take 30 mg by mouth daily.    Historical Provider, MD  loratadine (CLARITIN) 10 MG tablet Take 10 mg by mouth daily.    Historical Provider, MD  losartan (COZAAR) 50 MG tablet Take 50 mg by mouth every evening.    Historical Provider, MD  Melatonin 3 MG CAPS Take 3 mg by mouth at bedtime.     Historical Provider, MD  Mineral Oil Heavy OIL Place 1 drop into the right ear every Monday.     Historical Provider, MD  nitroGLYCERIN (NITROSTAT) 0.4 MG SL tablet Place 0.4 mg under the tongue every 5 (five) minutes as needed for chest pain (CALL 9-1-1 IF NO RELIEF AFTER THREE DOSES). For chest pain 01/17/14   Jettie Booze, MD  omeprazole (PRILOSEC) 20 MG capsule Take 40 mg by mouth daily.     Historical Provider, MD  phenol (QC SORE THROAT SPRAY) 1.4 % LIQD Use as directed 5 sprays in the mouth or throat every 2 (two) hours as needed for throat irritation / pain.     Historical Provider, MD  polyethylene glycol (MIRALAX / GLYCOLAX) packet Take 17 g by mouth daily as needed for mild  constipation.    Historical Provider, MD  sertraline (ZOLOFT) 100 MG tablet Take 200 mg by mouth daily.     Historical Provider, MD  simvastatin (ZOCOR) 10 MG tablet Take 10 mg by mouth at bedtime. 05/29/15   Historical Provider, MD  sodium chloride (OCEAN) 0.65 % SOLN nasal spray Place 2 sprays into both nostrils every 4 (four) hours. Scheduled while awake only    Historical Provider, MD  tiotropium (SPIRIVA) 18 MCG inhalation capsule Place 18 mcg into inhaler and inhale daily.    Historical Provider, MD  traZODone (DESYREL) 150 MG tablet Take 150 mg  by mouth at bedtime. 06/06/15   Historical Provider, MD  triamcinolone cream (KENALOG) 0.1 % Apply 1 application topically 2 (two) times daily as needed (for cracks on fingers/may keep at bedside).     Historical Provider, MD  warfarin (COUMADIN) 4 MG tablet Take 4-6 mg by mouth See admin instructions. 6 mg by mouth on Sunday at 5 PM (1700 hrs) and 4 mg by mouth daily at 5 PM (1700 hrs) on Mon/Tues/Wed/Thurs/Fri/Sat    Historical Provider, MD    Family History Family History  Problem Relation Age of Onset  . Heart disease Mother   . Cancer Mother   . Heart attack Father   . Cancer Sister     ovarian    Social History Social History  Substance Use Topics  . Smoking status: Former Smoker    Quit date: 04/09/2003  . Smokeless tobacco: Never Used  . Alcohol use No     Allergies   Review of patient's allergies indicates no known allergies.   Review of Systems Review of Systems  All other systems reviewed and are negative.    Physical Exam Updated Vital Signs Temp 97.3 F (36.3 C) (Oral)   Physical Exam  Constitutional: She is oriented to person, place, and time. She appears well-developed and well-nourished.  Non-toxic appearance. No distress.  HENT:  Head: Normocephalic and atraumatic.  Eyes: Conjunctivae, EOM and lids are normal. Pupils are equal, round, and reactive to light.  Neck: Normal range of motion. Neck supple. No  tracheal deviation present. No thyroid mass present.  Cardiovascular: Normal rate, regular rhythm and normal heart sounds.  Exam reveals no gallop.   No murmur heard. Pulmonary/Chest: Effort normal. No stridor. Tachypnea noted. No respiratory distress. She has decreased breath sounds in the right lower field and the left lower field. She has no wheezes. She has no rhonchi. She has no rales.  Abdominal: Soft. Normal appearance and bowel sounds are normal. She exhibits no distension. There is no tenderness. There is no rebound and no CVA tenderness.  Musculoskeletal: Normal range of motion. She exhibits no edema or tenderness.  Neurological: She is alert and oriented to person, place, and time. She has normal strength. No cranial nerve deficit or sensory deficit. GCS eye subscore is 4. GCS verbal subscore is 5. GCS motor subscore is 6.  Skin: Skin is warm and dry. No abrasion and no rash noted.  Psychiatric: She has a normal mood and affect. Her speech is normal and behavior is normal.  Nursing note and vitals reviewed.    ED Treatments / Results  Labs (all labs ordered are listed, but only abnormal results are displayed) Labs Reviewed  CULTURE, BLOOD (ROUTINE X 2)  CULTURE, BLOOD (ROUTINE X 2)  URINE CULTURE  COMPREHENSIVE METABOLIC PANEL  CBC WITH DIFFERENTIAL/PLATELET  URINALYSIS, ROUTINE W REFLEX MICROSCOPIC (NOT AT [facility])  BRAIN NATRIURETIC PEPTIDE  TROPONIN I  PROTIME-INR  I-STAT CG4 LACTIC ACID, ED    EKG  EKG Interpretation None       Radiology No results found.  Procedures Procedures (including critical care time)  Medications Ordered in ED Medications  albuterol (PROVENTIL) (2.5 MG/3ML) 0.083% nebulizer solution 5 mg (not administered)  methylPREDNISolone sodium succinate (SOLU-MEDROL) 125 mg/2 mL injection 125 mg (not administered)  albuterol (PROVENTIL) (2.5 MG/3ML) 0.083% nebulizer solution 5 mg (not administered)     Initial Impression / Assessment and  Plan / ED Course  I have reviewed the triage vital signs and the nursing notes.  Pertinent  labs & imaging results that were available during my care of the patient were reviewed by me and considered in my medical decision making (see chart for details).  Clinical Course   Patient's EKG does show atrial fibrillation. Does have a history of this. We'll treat patient's fever with Tylenol. Patient's INR noted. Chest x-ray consistent with pneumonia. Started on IV antibiotics. Blood cultures drawn before this. Troponin elevated noted labs from demand ischemia, elevated heart rate. Patient will be admitted to the hospitalist service  CRITICAL CARE Performed by: Leota Jacobsen Total critical care time: 50 minutes Critical care time was exclusive of separately billable procedures and treating other patients. Critical care was necessary to treat or prevent imminent or life-threatening deterioration. Critical care was time spent personally by me on the following activities: development of treatment plan with patient and/or surrogate as well as nursing, discussions with consultants, evaluation of patient's response to treatment, examination of patient, obtaining history from patient or surrogate, ordering and performing treatments and interventions, ordering and review of laboratory studies, ordering and review of radiographic studies, pulse oximetry and re-evaluation of patient's condition.  ED ECG REPORT   Date: 08/09/2016  Rate: 105  Rhythm: atrial fibrillation  QRS Axis: normal  Intervals: normal  ST/T Wave abnormalities: nonspecific ST changes  Conduction Disutrbances:none  Narrative Interpretation:   Old EKG Reviewed: none available  I have personally reviewed the EKG tracing and agree with the computerized printout as noted.   Final Clinical Impressions(s) / ED Diagnoses   Final diagnoses:  None    New Prescriptions New Prescriptions   No medications on file     Lacretia Leigh,  MD 08/09/16 2231

## 2016-08-10 ENCOUNTER — Inpatient Hospital Stay (HOSPITAL_COMMUNITY): Payer: Medicare Other

## 2016-08-10 DIAGNOSIS — J189 Pneumonia, unspecified organism: Secondary | ICD-10-CM | POA: Diagnosis present

## 2016-08-10 DIAGNOSIS — R0600 Dyspnea, unspecified: Secondary | ICD-10-CM

## 2016-08-10 LAB — URINALYSIS, ROUTINE W REFLEX MICROSCOPIC
GLUCOSE, UA: NEGATIVE mg/dL
KETONES UR: NEGATIVE mg/dL
LEUKOCYTES UA: NEGATIVE
Nitrite: NEGATIVE
PH: 6 (ref 5.0–8.0)
Protein, ur: 30 mg/dL — AB
Specific Gravity, Urine: 1.024 (ref 1.005–1.030)

## 2016-08-10 LAB — TROPONIN I
TROPONIN I: 0.07 ng/mL — AB (ref <0.03)
TROPONIN I: 0.08 ng/mL — AB (ref <0.03)
Troponin I: 0.08 ng/mL (ref <0.03)

## 2016-08-10 LAB — URINE MICROSCOPIC-ADD ON

## 2016-08-10 LAB — CBC AND DIFFERENTIAL
HEMATOCRIT: 33 % — AB (ref 36–46)
HEMOGLOBIN: 10.9 g/dL — AB (ref 12.0–16.0)
Platelets: 262 10*3/uL (ref 150–399)
WBC: 17.5 10^3/mL

## 2016-08-10 LAB — BASIC METABOLIC PANEL
ANION GAP: 11 (ref 5–15)
BUN: 23 mg/dL — AB (ref 4–21)
BUN: 23 mg/dL — ABNORMAL HIGH (ref 6–20)
CHLORIDE: 100 mmol/L — AB (ref 101–111)
CO2: 21 mmol/L — ABNORMAL LOW (ref 22–32)
Calcium: 9.9 mg/dL (ref 8.9–10.3)
Creatinine, Ser: 0.83 mg/dL (ref 0.44–1.00)
Creatinine: 0.8 mg/dL (ref 0.5–1.1)
GFR calc Af Amer: >60 mL/min (ref >60)
GLUCOSE: 150 mg/dL
GLUCOSE: 150 mg/dL — AB (ref 65–99)
POTASSIUM: 4 mmol/L (ref 3.4–5.3)
POTASSIUM: 4 mmol/L (ref 3.5–5.1)
SODIUM: 132 mmol/L — AB (ref 137–147)
Sodium: 132 mmol/L — ABNORMAL LOW (ref 135–145)

## 2016-08-10 LAB — CBC
HCT: 33 % — ABNORMAL LOW (ref 36.0–46.0)
HEMOGLOBIN: 10.9 g/dL — AB (ref 12.0–15.0)
MCH: 26.9 pg (ref 26.0–34.0)
MCHC: 33 g/dL (ref 30.0–36.0)
MCV: 81.5 fL (ref 78.0–100.0)
PLATELETS: 262 10*3/uL (ref 150–400)
RBC: 4.05 MIL/uL (ref 3.87–5.11)
RDW: 15.5 % (ref 11.5–15.5)
WBC: 17.5 10*3/uL — AB (ref 4.0–10.5)

## 2016-08-10 LAB — ECHOCARDIOGRAM COMPLETE
Height: 61 in
WEIGHTICAEL: 2544 [oz_av]

## 2016-08-10 LAB — PROTIME-INR

## 2016-08-10 MED ORDER — METOCLOPRAMIDE HCL 5 MG/ML IJ SOLN: 10.0000 mg | Freq: Once | INTRAMUSCULAR | Status: DC

## 2016-08-10 MED ORDER — KETOROLAC TROMETHAMINE 15 MG/ML IJ SOLN: 15.0000 mg | Freq: Once | INTRAMUSCULAR | Status: DC

## 2016-08-10 MED ORDER — TRAMADOL HCL 50 MG PO TABS
50.0000 mg | ORAL_TABLET | Freq: Four times a day (QID) | ORAL | Status: DC | PRN
  Administered 2016-08-10 – 2016-08-16 (×5): 50 mg via ORAL
  Filled 2016-08-10 (×5): qty 1

## 2016-08-10 MED ORDER — POLYETHYLENE GLYCOL 3350 17 G PO PACK: 17.0000 g | PACK | Freq: Every day | ORAL | Status: DC | PRN

## 2016-08-10 MED ORDER — TRAZODONE HCL 100 MG PO TABS
150.0000 mg | ORAL_TABLET | Freq: Every day | ORAL | Status: DC
  Administered 2016-08-10 – 2016-08-15 (×6): 150 mg via ORAL
  Filled 2016-08-10 (×6): qty 1

## 2016-08-10 MED ORDER — MAGNESIUM HYDROXIDE 400 MG/5ML PO SUSP
5.0000 mL | Freq: Every day | ORAL | Status: DC | PRN
  Administered 2016-08-11: 5 mL via ORAL
  Filled 2016-08-10: qty 30

## 2016-08-10 MED ORDER — ASPIRIN EC 81 MG PO TBEC
81.0000 mg | DELAYED_RELEASE_TABLET | Freq: Every day | ORAL | Status: DC
  Administered 2016-08-10 – 2016-08-14 (×5): 81 mg via ORAL
  Filled 2016-08-10 (×6): qty 1

## 2016-08-10 MED ORDER — DIPHENHYDRAMINE HCL 50 MG/ML IJ SOLN: 25.0000 mg | Freq: Once | INTRAMUSCULAR | Status: DC

## 2016-08-10 NOTE — Progress Notes (Signed)
[facility] for Warfarin Indication: atrial fibrillation  No Known Allergies  Patient Measurements: Height: 5\' 1"  (154.9 cm) Weight: 159 lb (72.1 kg) IBW/kg (Calculated) : 47.8  Vital Signs: Temp: 98 F (36.7 C) (09/12 0157) Temp Source: Oral (09/12 0157) BP: 144/86 (09/11 2230) Pulse Rate: 101 (09/12 0157)  Labs:  Recent Labs  08/09/16 2029 08/10/16 0111 08/10/16 0613  HGB 10.6*  --  10.9*  HCT 32.3*  --  33.0*  PLT 270  --  262  LABPROT >90.0*  --   --   INR >10.00*  --   --   CREATININE 0.96  --  0.83  TROPONINI 0.08* 0.07*  --     Estimated Creatinine Clearance: 52.3 mL/min (by C-G formula based on SCr of 0.83 mg/dL).   Medications:  Scheduled:  . arformoterol  15 mcg Nebulization BID  . budesonide (PULMICORT) nebulizer solution  0.5 mg Nebulization BID  . ceFEPime (MAXIPIME) IV  1 g Intravenous Q12H  . cyanocobalamin  500 mcg Oral Daily  . fluticasone  2 spray Each Nare Daily  . ipratropium-albuterol  3 mL Nebulization QID  . isosorbide mononitrate  30 mg Oral Daily  . loratadine  10 mg Oral Daily  . losartan  50 mg Oral QPM  . pantoprazole  40 mg Oral Daily  . predniSONE  60 mg Oral Q breakfast  . sertraline  200 mg Oral Daily  . simvastatin  10 mg Oral QHS  . vancomycin  750 mg Intravenous Q12H  . Warfarin - Pharmacist Dosing Inpatient   Does not apply q1800   Assessment: Cindy [name] is a 75 y.o. female admitted on 08/09/2016 with pneumonia and known to pharmacy from Vancomycin & Cefepime dosing.  PMH also includes warfarin for hx of Afib.  Pharmacy is consulted to dose warfarin.   PTA Warfarin 4mg  daily except 6mg  on Sunday, last dose 9/11 17:00.  INR upon arrival in ED > 10.  Vitamin K 5mg  PO x 1 given @ 23:47  Today, 08/10/2016:  INR > 10, remains supra-therapeutic  CBC: Hgb remains low/stable, Plt remain WNL  No bleeding reported or documented  Diet: heart healthy  Drug-drug interactions: broad  spectrum antibiotics may increase INR, Vit K may suppress INR for several days.  Goal of Therapy:  INR 2-3  Plan:  Continue to hold warfarin today. Daily PT/INR. Monitor for signs and symptoms of bleeding.   Gretta Arab PharmD, BCPS Pager [phone] 08/10/2016 7:04 AM

## 2016-08-10 NOTE — Progress Notes (Signed)
No documented output on patient since admission.  Patient stating that she feels like she needs to urinate.  Patient unable to urinate on bedpan.  Bladder scan showed 563 ml's.  Dr. Eliseo Squires notified, with order to in and out cath.

## 2016-08-10 NOTE — Clinical Social Work Note (Signed)
Clinical Social Work Assessment  Patient Details  Name: Cindy [name] MRN: [id number] Date of Birth: 06/07/1941  Date of referral:  08/10/16               Reason for consult:  Facility Placement                Permission sought to share information with:  Facility Contact Representative, Family Supports Permission granted to share information::  Yes, Verbal Permission Granted  Name::        Agency::     Relationship::     Contact Information:     Housing/Transportation Living arrangements for the past 2 months:  Assisted Living Facility Source of Information:  Patient Patient Interpreter Needed:  None Criminal Activity/Legal Involvement Pertinent to Current Situation/Hospitalization:  No - Comment as needed Significant Relationships:  Adult Children Lives with:  Facility Resident ([facility] ) Do you feel safe going back to the place where you live?  Yes Need for family participation in patient care:  No (Coment)  Care giving concerns:  Patient reports she is from [facility] facility. She plans to return back to Assisted Living- [facility]. Patient gave LCSWA permission to speak with her family contacts listed in her chart and keep them updated with her current status.   Social Worker assessment / plan:  LCSWA met with patient at bedside, the patient was eating her breakfast. LCSWA explained reason for consult. Patient will return to [facility] once discharged. LCSWA contacted [facility], they are expecting patient return. They requested updated FL2 form and any new medications.     Employment status:  Retired Insurance information:  Medicare PT Recommendations:  Not assessed at this time Information / Referral to community resources:   Assisted Living Facility   Patient/Family's Response to care:  Agreeable and responding well to care.   Patient/Family's Understanding of and Emotional Response to Diagnosis, Current Treatment, and Prognosis: "  Emotional  Assessment Appearance:  Appears older than stated age Attitude/Demeanor/Rapport:    Affect (typically observed):  Accepting, Calm Orientation:  Oriented to Self Alcohol / Substance use:  Not Applicable Psych involvement (Current and /or in the community):  No (Comment)  Discharge Needs  Concerns to be addressed:  Care Coordination, Discharge Planning Concerns Readmission within the last 30 days:  Yes Current discharge risk:  None Barriers to Discharge:  Continued Medical Work up    A , LCSW 08/10/2016, 12:07 PM  

## 2016-08-10 NOTE — Progress Notes (Signed)
PROGRESS NOTE    Cindy [name]  [id number] DOB: December 07, 1940 DOA: 08/09/2016 PCP: [name], MD   Outpatient Specialists:     Brief Narrative:  Cindy [name] is a 75 y.o. female with medical history significant of CVA following carotid endarterectomy, atrial fibrillation on chronic anticoagulation, HTN, anxiety/depression, gerd; who presents with complaints of shortness of breath progressively worsening over the last 3 days. Patient is  a poor historian.  She was noted to have a productive cough with associated symptoms included shortness of breath, wheezing, nausea, vomiting, and diaphoresis. She was noted to have had a fever of 101F. Patient then treated with home inhalers without relief of symptoms.  Upon admission to the emergency department patient was noted to be febrile to 100.44F, pulse of up to 137, respirations up to 28, O2 saturation as low as 91% on 2 L nasal cannula oxygen. Lab work revealed WBC 15.8, hemoglobin 10.6, sodium 132, potassium 3.3, chloride 98, BUN 23, INR >10, and troponin 0.08. Chest x-ray revealed a left lower lobe pneumonia with small left pleural effusion. Sepsis protocol initiated and patient was started on empiric antibiotics of vancomycin and cefepime.    Assessment & Plan:   Principal Problem:   Sepsis ([place]) Active Problems:   Asthma   A-fib (HCC)   GERD (gastroesophageal reflux disease)   Anxiety   Hypertension   HCAP (healthcare-associated pneumonia)   Sepsis secondary to HCAP:  -Acute - Follow-up cultures- blood/sputum - Empiric antibiotics of vancomycin and cefepime - Tylenol prn fever   Asthma exacerbation: acute. - Budesonide and Brovana nebs - Duonebs q QID and prn SOB/Wheezing - Prednisone 60 mg oral daily - Mucinex  Urinary retention -I/o x 1 -periodic bladder scan -may need foley placed  Atrial fibrillation with supratherapeutic INR: initial INR greater than 10. Chadsvasc score = 6 - s/p vitamin K 5 mg oral -- no sign  of bleeding-- hold on FFP- daily INR - held Coumadin  Elevated troponins: Acute. Troponin 0.08 on admission  -suspect demand ischemia - Trend cardiac troponin  Coronary artery disease - Continue aspirin and isosorbide mononitrate  Essential hypertension - Continue losartan   Anxiety/depression - Continue Klonopin prn -d/c zoloft due to Qtc  Hyperlipidemia  - Continue simvastatin   Hypokalemia - repleted - Continue to monitor and replace as needed  Hyponatremia:  Sodium 132 on admission suspect symptoms could be secondary to nausea and vomiting. - Gentle IV fluid hydration - Recheck BMP in a.m.   elevated BNP -check echo -not one on file  History CVA PT Eval once stable -from [facility]  GERD  - Pharmacy substitution of Protonix    DVT prophylaxis:  Fully anticoagulated   Code Status: Full Code   Family Communication: No family at bedside  Disposition Plan:     Consultants:         Subjective: Does not feel well   Objective: Vitals:   08/10/16 0011 08/10/16 0016 08/10/16 0157 08/10/16 0838  BP:      Pulse:   (!) 101   Resp:   18   Temp:   98 F (36.7 C)   TempSrc:   Oral   SpO2: 96% 100% 97% 98%  Weight:      Height:        Intake/Output Summary (Last 24 hours) at 08/10/16 0942 Last data filed at 08/10/16 0916  Gross per 24 hour  Intake  0 ml  Output                0 ml  Net                0 ml   Filed Weights   08/09/16 2107 08/09/16 2227  Weight: 72.1 kg (159 lb) 72.1 kg (159 lb)    Examination:  General exam: ill appearing, pleasantly confused Respiratory system: Coarse breath sounds Cardiovascular system: irregular Gastrointestinal system:  No organomegaly or masses felt. Normal bowel sounds heard. Central nervous system: Awake     Data Reviewed: I have personally reviewed following labs and imaging studies  CBC:  Recent Labs Lab 08/09/16 2029 08/10/16 0613  WBC 15.8* 17.5*    NEUTROABS 14.1*  --   HGB 10.6* 10.9*  HCT 32.3* 33.0*  MCV 81.2 81.5  PLT 270 99991111   Basic Metabolic Panel:  Recent Labs Lab 08/09/16 2029 08/10/16 0613  NA 132* 132*  K 3.3* 4.0  CL 98* 100*  CO2 24 21*  GLUCOSE 96 150*  BUN 23* 23*  CREATININE 0.96 0.83  CALCIUM 10.1 9.9   GFR: Estimated Creatinine Clearance: 52.3 mL/min (by C-G formula based on SCr of 0.83 mg/dL). Liver Function Tests:  Recent Labs Lab 08/09/16 2029  AST 100*  ALT 80*  ALKPHOS 93  BILITOT 1.2  PROT 6.6  ALBUMIN 3.1*   No results for input(s): LIPASE, AMYLASE in the last 168 hours. No results for input(s): AMMONIA in the last 168 hours. Coagulation Profile:  Recent Labs Lab 08/09/16 2029 08/10/16 0613  INR >10.00* >10.00*   Cardiac Enzymes:  Recent Labs Lab 08/09/16 2029 08/10/16 0111 08/10/16 0613  TROPONINI 0.08* 0.07* 0.08*   BNP (last 3 results) No results for input(s): PROBNP in the last 8760 hours. HbA1C: No results for input(s): HGBA1C in the last 72 hours. CBG: No results for input(s): GLUCAP in the last 168 hours. Lipid Profile: No results for input(s): CHOL, HDL, LDLCALC, TRIG, CHOLHDL, LDLDIRECT in the last 72 hours. Thyroid Function Tests: No results for input(s): TSH, T4TOTAL, FREET4, T3FREE, THYROIDAB in the last 72 hours. Anemia Panel: No results for input(s): VITAMINB12, FOLATE, FERRITIN, TIBC, IRON, RETICCTPCT in the last 72 hours. Urine analysis:    Component Value Date/Time   COLORURINE YELLOW 01/02/2015 1313   APPEARANCEUR CLOUDY (A) 01/02/2015 1313   LABSPEC 1.006 01/02/2015 1313   PHURINE 6.0 01/02/2015 1313   GLUCOSEU NEGATIVE 01/02/2015 1313   HGBUR NEGATIVE 01/02/2015 1313   BILIRUBINUR NEGATIVE 01/02/2015 1313   KETONESUR NEGATIVE 01/02/2015 1313   PROTEINUR NEGATIVE 01/02/2015 1313   UROBILINOGEN 0.2 01/02/2015 1313   NITRITE NEGATIVE 01/02/2015 1313   LEUKOCYTESUR NEGATIVE 01/02/2015 1313     )No results found for this or any previous  visit (from the past 240 hour(s)).    Anti-infectives    Start     Dose/Rate Route Frequency Ordered Stop   08/10/16 1000  vancomycin (VANCOCIN) IVPB 750 mg/150 ml premix     750 mg 150 mL/hr over 60 Minutes Intravenous Every 12 hours 08/09/16 2151     08/10/16 0900  ceFEPIme (MAXIPIME) 1 g in dextrose 5 % 50 mL IVPB     1 g 100 mL/hr over 30 Minutes Intravenous Every 12 hours 08/09/16 2153     08/09/16 2115  ceFEPIme (MAXIPIME) 2 g in dextrose 5 % 50 mL IVPB     2 g 100 mL/hr over 30 Minutes Intravenous  Once 08/09/16 2101 08/09/16 2158  08/09/16 2115  vancomycin (VANCOCIN) IVPB 1000 mg/200 mL premix     1,000 mg 200 mL/hr over 60 Minutes Intravenous  Once 08/09/16 2101 08/10/16 0037       Radiology Studies: Dg Chest 2 View  Result Date: 08/09/2016 CLINICAL DATA:  Cough, shortness of breath and fever. EXAM: CHEST  2 VIEW COMPARISON:  04/26/2016. FINDINGS: Stable enlarged cardiac silhouette. Interval patchy opacity in the left lower lobe. Diffuse peribronchial thickening and accentuation of the interstitial markings with progression. Interval minimal patchy opacity at the right lung base. Small left pleural effusion. IMPRESSION: 1. Left lower lobe pneumonia with a small left pleural effusion. 2. Minimal patchy atelectasis or pneumonia at the right lung base. 3. Moderate diffuse bronchitic changes. 4. Stable cardiomegaly. Electronically Signed   By: Claudie Revering M.D.   On: 08/09/2016 20:46        Scheduled Meds: . arformoterol  15 mcg Nebulization BID  . budesonide (PULMICORT) nebulizer solution  0.5 mg Nebulization BID  . ceFEPime (MAXIPIME) IV  1 g Intravenous Q12H  . cyanocobalamin  500 mcg Oral Daily  . fluticasone  2 spray Each Nare Daily  . ipratropium-albuterol  3 mL Nebulization QID  . isosorbide mononitrate  30 mg Oral Daily  . loratadine  10 mg Oral Daily  . losartan  50 mg Oral QPM  . pantoprazole  40 mg Oral Daily  . predniSONE  60 mg Oral Q breakfast  .  simvastatin  10 mg Oral QHS  . vancomycin  750 mg Intravenous Q12H  . Warfarin - Pharmacist Dosing Inpatient   Does not apply q1800   Continuous Infusions:    LOS: 1 day    Time spent: 35 min    [place], DO [facility] Pager [phone]  If 7PM-7AM, please contact night-coverage [email or web address] Password [facility] 08/10/2016, 9:42 AM

## 2016-08-10 NOTE — Care Management Note (Signed)
Case Management Note  Patient Details  Name: Cindy [name] MRN: [id number] Date of Birth: 04-Mar-1941  Subjective/Objective:      pna              Action/Plan:  Home when stable   Expected Discharge Date:                  Expected Discharge Plan:  Home/Self Care  In-House Referral:  NA  Discharge planning Services  NA  Post Acute Care Choice:  NA Choice offered to:  NA  DME Arranged:  N/A DME Agency:  NA  HH Arranged:  NA HH Agency:  NA  Status of Service:  In process, will continue to follow  If discussed at Long Length of Stay Meetings, dates discussed:    Additional Comments:Date:  August 10, 2016 Chart reviewed for concurrent status and case management needs. Will continue to follow the patient for status change: Discharge Planning: following for needs Expected discharge date: [id number] Velva Harman, BSN, [place], [place]  Leeroy Cha, RN 08/10/2016, 10:53 AM

## 2016-08-10 NOTE — Progress Notes (Signed)
  Echocardiogram 2D Echocardiogram has been performed.  Tresa Res 08/10/2016, 2:40 PM

## 2016-08-10 NOTE — ED Notes (Signed)
Report given to [facility] on 5W

## 2016-08-11 DIAGNOSIS — I4891 Unspecified atrial fibrillation: Secondary | ICD-10-CM

## 2016-08-11 DIAGNOSIS — A419 Sepsis, unspecified organism: Principal | ICD-10-CM

## 2016-08-11 DIAGNOSIS — K219 Gastro-esophageal reflux disease without esophagitis: Secondary | ICD-10-CM

## 2016-08-11 DIAGNOSIS — J45901 Unspecified asthma with (acute) exacerbation: Secondary | ICD-10-CM

## 2016-08-11 DIAGNOSIS — I10 Essential (primary) hypertension: Secondary | ICD-10-CM

## 2016-08-11 DIAGNOSIS — J189 Pneumonia, unspecified organism: Secondary | ICD-10-CM

## 2016-08-11 LAB — URINE CULTURE: CULTURE: NO GROWTH

## 2016-08-11 LAB — PROTIME-INR
INR: 4.88 — AB
PROTHROMBIN TIME: 46.9 s — AB (ref 11.4–15.2)

## 2016-08-11 MED ORDER — DOXYCYCLINE HYCLATE 100 MG PO TABS
100.0000 mg | ORAL_TABLET | Freq: Two times a day (BID) | ORAL | Status: DC
  Administered 2016-08-12 – 2016-08-16 (×9): 100 mg via ORAL
  Filled 2016-08-11 (×9): qty 1

## 2016-08-11 MED ORDER — LEVALBUTEROL HCL 0.63 MG/3ML IN NEBU
0.6300 mg | INHALATION_SOLUTION | Freq: Three times a day (TID) | RESPIRATORY_TRACT | Status: DC
  Administered 2016-08-11 – 2016-08-15 (×12): 0.63 mg via RESPIRATORY_TRACT
  Filled 2016-08-11 (×13): qty 3

## 2016-08-11 MED ORDER — CARVEDILOL 3.125 MG PO TABS
3.1250 mg | ORAL_TABLET | Freq: Two times a day (BID) | ORAL | Status: DC
  Administered 2016-08-11 – 2016-08-16 (×11): 3.125 mg via ORAL
  Filled 2016-08-11 (×11): qty 1

## 2016-08-11 NOTE — Progress Notes (Addendum)
PROGRESS NOTE    Cindy [name]  [id number] DOB: [birth year] DOA: 08/09/2016 PCP: [name], MD   Outpatient Specialists:     Brief Narrative:  Cindy [name] is a 75 y.o. female with medical history significant of CVA following carotid endarterectomy, atrial fibrillation on chronic anticoagulation, HTN, anxiety/depression, gerd; who presents with complaints of shortness of breath progressively worsening over the last 3 days. Patient is  a poor historian.  She was noted to have a productive cough with associated symptoms included shortness of breath, wheezing, nausea, vomiting, and diaphoresis. She was noted to have had a fever of 101F. Patient then treated with home inhalers without relief of symptoms.  Upon admission to the emergency department patient was noted to be febrile to 100.15F, pulse of up to 137, respirations up to 28, O2 saturation as low as 91% on 2 L nasal cannula oxygen. Lab work revealed WBC 15.8, hemoglobin 10.6, sodium 132, potassium 3.3, chloride 98, BUN 23, INR >10, and troponin 0.08. Chest x-ray revealed a left lower lobe pneumonia with small left pleural effusion. Sepsis protocol initiated and patient was started on empiric antibiotics of vancomycin and cefepime.    Assessment & Plan:   Principal Problem:   Sepsis ([place]) Active Problems:   Asthma   A-fib (HCC)   GERD (gastroesophageal reflux disease)   Anxiety   Hypertension   HCAP (healthcare-associated pneumonia)  Sepsis secondary to HCAP:  - Follow-up cultures- blood/sputum: NGTD - Empiric antibiotics of vancomycin and cefepime, de-escalate 9/14 - Tylenol prn fever   Asthma exacerbation: acute. Clinically improving - Budesonide and Brovana nebs - Duonebs q QID and prn SOB/Wheezing - Prednisone 60 mg oral daily - Mucinex  Urinary retention -I/o x 1 -periodic bladder scan -may need foley placed  Atrial fibrillation with supratherapeutic INR: initial INR greater than 10. Chadsvasc score = 6 -  s/p vitamin K 5 mg oral -- no signs of bleeding--daily INR - holding Coumadin Pt having RVR and not on any rate limiting medications, will try low dose carvedilol to control rate  Elevated troponins: Acute. Troponin 0.08 on admission  -suspect demand ischemia - Trend cardiac troponin remained stable  Coronary artery disease - Continue aspirin and isosorbide mononitrate  Essential hypertension - Continue losartan   Anxiety/depression - Continue Klonopin prn -d/c zoloft due to Qtc  Hyperlipidemia  - Continue simvastatin   Hypokalemia - repleted - Continue to monitor and replace as needed  Hyponatremia:  Sodium 132 on admission suspect symptoms could be secondary to nausea and vomiting. - Gentle IV fluid hydration - Recheck BMP in a.m.   Mild Acute CHF -Echo reveals mild systolic dysfunction and EF 40-45%.  Added coreg, already on ARB (losartan)  History CVA PT Eval once stable -[facility]  GERD  - Pharmacy substitution of Protonix   DVT prophylaxis:  Fully anticoagulated   Code Status: Full Code   Family Communication: No family at bedside  Disposition Plan:    Subjective: Does not feel well   Objective: Vitals:   08/10/16 2145 08/11/16 0642 08/11/16 1002 08/11/16 1007  BP: (!) 117/52     Pulse: (!) 107     Resp: 20     Temp: 98.8 F (37.1 C)     TempSrc: Oral     SpO2: 99%  99% 99%  Weight:  72.1 kg (159 lb)    Height:        Intake/Output Summary (Last 24 hours) at 08/11/16 1135 Last data filed at 08/10/16  2109  Gross per 24 hour  Intake                0 ml  Output                0 ml  Net                0 ml   Filed Weights   08/09/16 2107 08/09/16 2227 08/11/16 0642  Weight: 72.1 kg (159 lb) 72.1 kg (159 lb) 72.1 kg (159 lb)    Examination:  General exam: ill appearing, pleasantly confused Respiratory system: Coarse breath sounds Cardiovascular system: irregularly irregular, tachycardic Gastrointestinal system:   No organomegaly or masses felt. Normal bowel sounds heard. Central nervous system: Awake  Data Reviewed: I have personally reviewed following labs and imaging studies  CBC:  Recent Labs Lab 08/09/16 2029 08/10/16 0613  WBC 15.8* 17.5*  NEUTROABS 14.1*  --   HGB 10.6* 10.9*  HCT 32.3* 33.0*  MCV 81.2 81.5  PLT 270 99991111   Basic Metabolic Panel:  Recent Labs Lab 08/09/16 2029 08/10/16 0613  NA 132* 132*  K 3.3* 4.0  CL 98* 100*  CO2 24 21*  GLUCOSE 96 150*  BUN 23* 23*  CREATININE 0.96 0.83  CALCIUM 10.1 9.9   GFR: Estimated Creatinine Clearance: 52.3 mL/min (by C-G formula based on SCr of 0.83 mg/dL). Liver Function Tests:  Recent Labs Lab 08/09/16 2029  AST 100*  ALT 80*  ALKPHOS 93  BILITOT 1.2  PROT 6.6  ALBUMIN 3.1*   No results for input(s): LIPASE, AMYLASE in the last 168 hours. No results for input(s): AMMONIA in the last 168 hours. Coagulation Profile:  Recent Labs Lab 08/09/16 2029 08/10/16 0613 08/11/16 0604  INR >10.00* >10.00* 4.88*   Cardiac Enzymes:  Recent Labs Lab 08/09/16 2029 08/10/16 0111 08/10/16 0613 08/10/16 1153  TROPONINI 0.08* 0.07* 0.08* 0.08*   BNP (last 3 results) No results for input(s): PROBNP in the last 8760 hours. HbA1C: No results for input(s): HGBA1C in the last 72 hours. CBG: No results for input(s): GLUCAP in the last 168 hours. Lipid Profile: No results for input(s): CHOL, HDL, LDLCALC, TRIG, CHOLHDL, LDLDIRECT in the last 72 hours. Thyroid Function Tests: No results for input(s): TSH, T4TOTAL, FREET4, T3FREE, THYROIDAB in the last 72 hours. Anemia Panel: No results for input(s): VITAMINB12, FOLATE, FERRITIN, TIBC, IRON, RETICCTPCT in the last 72 hours. Urine analysis:    Component Value Date/Time   COLORURINE AMBER (A) 08/10/2016 0947   APPEARANCEUR CLOUDY (A) 08/10/2016 0947   LABSPEC 1.024 08/10/2016 0947   PHURINE 6.0 08/10/2016 0947   GLUCOSEU NEGATIVE 08/10/2016 0947   HGBUR TRACE (A)  08/10/2016 0947   BILIRUBINUR SMALL (A) 08/10/2016 0947   KETONESUR NEGATIVE 08/10/2016 0947   PROTEINUR 30 (A) 08/10/2016 0947   UROBILINOGEN 0.2 01/02/2015 1313   NITRITE NEGATIVE 08/10/2016 0947   LEUKOCYTESUR NEGATIVE 08/10/2016 0947    Recent Results (from the past 240 hour(s))  Blood Culture (routine x 2)     Status: None (Preliminary result)   Collection Time: 08/09/16  8:29 PM  Result Value Ref Range Status   Specimen Description BLOOD RIGHT ANTECUBITAL  Final   Special Requests BOTTLES DRAWN AEROBIC AND ANAEROBIC 5CC EACH  Final   Culture   Final    NO GROWTH < 24 HOURS Performed at [facility]    Report Status PENDING  Incomplete  Blood Culture (routine x 2)     Status: None (  Preliminary result)   Collection Time: 08/09/16  8:29 PM  Result Value Ref Range Status   Specimen Description BLOOD RIGHT HAND  Final   Special Requests IN PEDIATRIC BOTTLE 2.5CC  Final   Culture   Final    NO GROWTH < 24 HOURS Performed at [facility]    Report Status PENDING  Incomplete  Urine culture     Status: None   Collection Time: 08/10/16  9:47 AM  Result Value Ref Range Status   Specimen Description URINE, CATHETERIZED  Final   Special Requests NONE  Final   Culture NO GROWTH Performed at [facility]   Final   Report Status 08/11/2016 FINAL  Final      Anti-infectives    Start     Dose/Rate Route Frequency Ordered Stop   08/10/16 1000  vancomycin (VANCOCIN) IVPB 750 mg/150 ml premix     750 mg 150 mL/hr over 60 Minutes Intravenous Every 12 hours 08/09/16 2151     08/10/16 0900  ceFEPIme (MAXIPIME) 1 g in dextrose 5 % 50 mL IVPB     1 g 100 mL/hr over 30 Minutes Intravenous Every 12 hours 08/09/16 2153     08/09/16 2115  ceFEPIme (MAXIPIME) 2 g in dextrose 5 % 50 mL IVPB     2 g 100 mL/hr over 30 Minutes Intravenous  Once 08/09/16 2101 08/09/16 2158   08/09/16 2115  vancomycin (VANCOCIN) IVPB 1000 mg/200 mL premix     1,000 mg 200 mL/hr over 60  Minutes Intravenous  Once 08/09/16 2101 08/10/16 0037     Radiology Studies: Dg Chest 2 View  Result Date: 08/09/2016 CLINICAL DATA:  Cough, shortness of breath and fever. EXAM: CHEST  2 VIEW COMPARISON:  04/26/2016. FINDINGS: Stable enlarged cardiac silhouette. Interval patchy opacity in the left lower lobe. Diffuse peribronchial thickening and accentuation of the interstitial markings with progression. Interval minimal patchy opacity at the right lung base. Small left pleural effusion. IMPRESSION: 1. Left lower lobe pneumonia with a small left pleural effusion. 2. Minimal patchy atelectasis or pneumonia at the right lung base. 3. Moderate diffuse bronchitic changes. 4. Stable cardiomegaly. Electronically Signed   By: Claudie Revering M.D.   On: 08/09/2016 20:46   Scheduled Meds: . arformoterol  15 mcg Nebulization BID  . aspirin EC  81 mg Oral Daily  . budesonide (PULMICORT) nebulizer solution  0.5 mg Nebulization BID  . carvedilol  3.125 mg Oral BID WC  . ceFEPime (MAXIPIME) IV  1 g Intravenous Q12H  . cyanocobalamin  500 mcg Oral Daily  . fluticasone  2 spray Each Nare Daily  . isosorbide mononitrate  30 mg Oral Daily  . levalbuterol  0.63 mg Nebulization TID  . loratadine  10 mg Oral Daily  . losartan  50 mg Oral QPM  . pantoprazole  40 mg Oral Daily  . predniSONE  60 mg Oral Q breakfast  . simvastatin  10 mg Oral QHS  . traZODone  150 mg Oral QHS  . vancomycin  750 mg Intravenous Q12H  . Warfarin - Pharmacist Dosing Inpatient   Does not apply q1800   Continuous Infusions:    LOS: 2 days    Time spent: 27 min  Irwin Brakeman, MD [facility] Pager [phone]  If 7PM-7AM, please contact night-coverage [email or web address] Password [facility] 08/11/2016, 11:35 AM

## 2016-08-11 NOTE — Progress Notes (Signed)
ANTICOAGULATION CONSULT NOTE   Pharmacy Consult for Warfarin Indication: atrial fibrillation  No Known Allergies  Patient Measurements: Height: 5\' 1"  (154.9 cm) Weight: 159 lb (72.1 kg) IBW/kg (Calculated) : 47.8  Vital Signs: Temp: 98.8 F (37.1 C) (09/12 2145) Temp Source: Oral (09/12 2145) BP: 117/52 (09/12 2145) Pulse Rate: 107 (09/12 2145)  Labs:  Recent Labs  08/09/16 2029 08/10/16 0111 08/10/16 0613 08/10/16 1153 08/11/16 0604  HGB 10.6*  --  10.9*  --   --   HCT 32.3*  --  33.0*  --   --   PLT 270  --  262  --   --   LABPROT >90.0*  --  >90.0*  --  46.9*  INR >10.00*  --  >10.00*  --  4.88*  CREATININE 0.96  --  0.83  --   --   TROPONINI 0.08* 0.07* 0.08* 0.08*  --     Estimated Creatinine Clearance: 52.3 mL/min (by C-G formula based on SCr of 0.83 mg/dL).   Medications:  Scheduled:  . arformoterol  15 mcg Nebulization BID  . aspirin EC  81 mg Oral Daily  . budesonide (PULMICORT) nebulizer solution  0.5 mg Nebulization BID  . ceFEPime (MAXIPIME) IV  1 g Intravenous Q12H  . cyanocobalamin  500 mcg Oral Daily  . fluticasone  2 spray Each Nare Daily  . ipratropium-albuterol  3 mL Nebulization QID  . isosorbide mononitrate  30 mg Oral Daily  . loratadine  10 mg Oral Daily  . losartan  50 mg Oral QPM  . pantoprazole  40 mg Oral Daily  . predniSONE  60 mg Oral Q breakfast  . simvastatin  10 mg Oral QHS  . traZODone  150 mg Oral QHS  . vancomycin  750 mg Intravenous Q12H  . Warfarin - Pharmacist Dosing Inpatient   Does not apply q1800   Assessment: Cindy [name] is a 75 y.o. female admitted on 08/09/2016 with pneumonia and known to pharmacy from Vancomycin & Cefepime dosing.  PMH also includes warfarin for hx of Afib, hx CVA.  Pharmacy is consulted to dose warfarin.   PTA Warfarin 4mg  daily except 6mg  on Sunday, last dose 9/11 17:00.  INR upon arrival in ED > 10.  Vitamin K 5mg  PO x 1 given @ 23:47  Today, 08/11/2016:  INR 4.88, remains  supra-therapeutic but decreasing  CBC: Hgb remains low/stable, Plt remain WNL (9/12)  No bleeding reported or documented  Diet: heart healthy  Drug-drug interactions: broad spectrum antibiotics may increase INR, Vit K may suppress INR for several days.  Goal of Therapy:  INR 2-3  Plan:  Continue to hold warfarin today. Daily PT/INR. Monitor for signs and symptoms of bleeding.   Gretta Arab PharmD, BCPS Pager [phone] 08/11/2016 7:21 AM

## 2016-08-12 LAB — CBC WITH DIFFERENTIAL/PLATELET
BASOS ABS: 0 10*3/uL (ref 0.0–0.1)
Basophils Relative: 0 %
Eosinophils Absolute: 0 10*3/uL (ref 0.0–0.7)
Eosinophils Relative: 0 %
HEMATOCRIT: 29.1 % — AB (ref 36.0–46.0)
Hemoglobin: 9.5 g/dL — ABNORMAL LOW (ref 12.0–15.0)
LYMPHS ABS: 1.1 10*3/uL (ref 0.7–4.0)
LYMPHS PCT: 5 %
MCH: 26.8 pg (ref 26.0–34.0)
MCHC: 32.6 g/dL (ref 30.0–36.0)
MCV: 82 fL (ref 78.0–100.0)
MONO ABS: 1.4 10*3/uL — AB (ref 0.1–1.0)
MONOS PCT: 6 %
NEUTROS ABS: 20.5 10*3/uL — AB (ref 1.7–7.7)
Neutrophils Relative %: 89 %
Platelets: 337 10*3/uL (ref 150–400)
RBC: 3.55 MIL/uL — ABNORMAL LOW (ref 3.87–5.11)
RDW: 15.7 % — AB (ref 11.5–15.5)
WBC: 23 10*3/uL — ABNORMAL HIGH (ref 4.0–10.5)

## 2016-08-12 LAB — COMPREHENSIVE METABOLIC PANEL
ALT: 127 U/L — ABNORMAL HIGH (ref 14–54)
AST: 110 U/L — AB (ref 15–41)
Albumin: 2.7 g/dL — ABNORMAL LOW (ref 3.5–5.0)
Alkaline Phosphatase: 88 U/L (ref 38–126)
Anion gap: 7 (ref 5–15)
BILIRUBIN TOTAL: 1 mg/dL (ref 0.3–1.2)
BUN: 29 mg/dL — AB (ref 6–20)
CO2: 26 mmol/L (ref 22–32)
CREATININE: 0.88 mg/dL (ref 0.44–1.00)
Calcium: 10 mg/dL (ref 8.9–10.3)
Chloride: 101 mmol/L (ref 101–111)
GFR calc Af Amer: >60 mL/min (ref >60)
Glucose, Bld: 123 mg/dL — ABNORMAL HIGH (ref 65–99)
POTASSIUM: 4.7 mmol/L (ref 3.5–5.1)
Sodium: 134 mmol/L — ABNORMAL LOW (ref 135–145)
TOTAL PROTEIN: 5.9 g/dL — AB (ref 6.5–8.1)

## 2016-08-12 LAB — BASIC METABOLIC PANEL
BUN: 29 mg/dL — AB (ref 4–21)
CREATININE: 0.9 mg/dL (ref 0.5–1.1)
Glucose: 123 mg/dL
Sodium: 134 mmol/L — AB (ref 137–147)

## 2016-08-12 LAB — PROTIME-INR
INR: 3.32
Prothrombin Time: 34.5 seconds — ABNORMAL HIGH (ref 11.4–15.2)

## 2016-08-12 LAB — CBC AND DIFFERENTIAL
HEMATOCRIT: 29 % — AB (ref 36–46)
HEMOGLOBIN: 9.5 g/dL — AB (ref 12.0–16.0)
PLATELETS: 337 10*3/uL (ref 150–399)
WBC: 23 10*3/mL

## 2016-08-12 LAB — HEPATIC FUNCTION PANEL: Bilirubin, Total: 1 mg/dL

## 2016-08-12 MED ORDER — PREDNISONE 20 MG PO TABS
20.0000 mg | ORAL_TABLET | Freq: Every day | ORAL | Status: DC
  Administered 2016-08-13 – 2016-08-16 (×4): 20 mg via ORAL
  Filled 2016-08-12 (×4): qty 1

## 2016-08-12 MED ORDER — WARFARIN SODIUM 1 MG PO TABS
1.0000 mg | ORAL_TABLET | Freq: Once | ORAL | Status: AC
  Administered 2016-08-12: 1 mg via ORAL
  Filled 2016-08-12: qty 1

## 2016-08-12 NOTE — Progress Notes (Signed)
PROGRESS NOTE    Cindy [name]  [id number] DOB: [birth year] DOA: 08/09/2016 PCP: [name], MD   Outpatient Specialists:     Brief Narrative:  Cindy [name] is a 75 y.o. female with medical history significant of CVA following carotid endarterectomy, atrial fibrillation on chronic anticoagulation, HTN, anxiety/depression, gerd; who presents with complaints of shortness of breath progressively worsening over the last 3 days. Patient is  a poor historian.  She was noted to have a productive cough with associated symptoms included shortness of breath, wheezing, nausea, vomiting, and diaphoresis. She was noted to have had a fever of 101F. Patient then treated with home inhalers without relief of symptoms.  Upon admission to the emergency department patient was noted to be febrile to 100.35F, pulse of up to 137, respirations up to 28, O2 saturation as low as 91% on 2 L nasal cannula oxygen. Lab work revealed WBC 15.8, hemoglobin 10.6, sodium 132, potassium 3.3, chloride 98, BUN 23, INR >10, and troponin 0.08. Chest x-ray revealed a left lower lobe pneumonia with small left pleural effusion. Sepsis protocol initiated and patient was started on empiric antibiotics of vancomycin and cefepime.    Assessment & Plan:   Principal Problem:   Sepsis ([place]) Active Problems:   Asthma   A-fib (HCC)   GERD (gastroesophageal reflux disease)   Anxiety   Hypertension   HCAP (healthcare-associated pneumonia)  Sepsis secondary to HCAP:  - Follow-up cultures- blood/sputum: NGTD - Empiric antibiotics of vancomycin and cefepime, de-escalate 9/14 - Tylenol prn fever   Asthma exacerbation: acute. Clinically improving - Budesonide and Brovana nebs - Duonebs q QID and prn SOB/Wheezing - Prednisone 60 mg oral daily - Mucinex  Urinary retention -I/o x 1 -periodic bladder scan -may need foley placed  Atrial fibrillation with supratherapeutic INR: initial INR greater than 10. Chadsvasc score = 6 -  s/p vitamin K 5 mg oral -- no signs of bleeding--daily INR - holding Coumadin per pharmacy, restarting per pharmacy as INR is trending down to therapeutic 9/14 Pt having RVR and not on any rate limiting medications, started low dose carvedilol to control rate and has improved  Elevated troponins: Acute. Troponin 0.08 on admission  -suspect demand ischemia - Trend cardiac troponin remained stable  Coronary artery disease - Continue aspirin and isosorbide mononitrate  Essential hypertension - Continue losartan   Anxiety/depression - Continue Klonopin prn -d/c zoloft due to Qtc  Hyperlipidemia  - Continue simvastatin   Hypokalemia - repleted - Continue to monitor and replace as needed  Hyponatremia:  Sodium 132 on admission suspect symptoms could be secondary to nausea and vomiting. - Gentle IV fluid hydration - Recheck BMP in a.m.   Mild Acute CHF -Echo reveals mild systolic dysfunction and EF 40-45%.  Added coreg, already on ARB (losartan)  History CVA PT Eval once stable -from [facility]  GERD  - Pharmacy substitution of Protonix   Evaluate for dysphagia - I asked for SLP to see patient to be sure and evaluate her fully.  DVT prophylaxis:  Fully anticoagulated  Code Status: Full Code  Family Communication: Daughter was at bedside  Disposition Plan:  DC to SNF in 2 days   Subjective: She is more alert today and interactive with daughter at bedside  Objective: Vitals:   08/11/16 2241 08/12/16 0514 08/12/16 0823 08/12/16 0838  BP: 110/73 124/71    Pulse: 81 87    Resp: 18 17    Temp: 97.6 F (36.4 C) 98 F (36.7 C)  TempSrc: Oral Oral    SpO2: 100% 100% 100% 100%  Weight:  70.2 kg (154 lb 12.2 oz)    Height:  5\' 1"  (1.549 m)      Intake/Output Summary (Last 24 hours) at 08/12/16 1033 Last data filed at 08/12/16 0929  Gross per 24 hour  Intake               60 ml  Output                0 ml  Net               60 ml   Filed  Weights   08/09/16 2227 08/11/16 0642 08/12/16 0514  Weight: 72.1 kg (159 lb) 72.1 kg (159 lb) 70.2 kg (154 lb 12.2 oz)    Examination:  General exam: improved appearing, more alert and interactive, NAD.  Respiratory system: Coarse breath sounds, expiratory wheezes heard. Cardiovascular system: irregularly irregular, tachycardic Gastrointestinal system:  No organomegaly or masses felt. Normal bowel sounds heard. Central nervous system: Awake and alert.  Data Reviewed: I have personally reviewed following labs and imaging studies  CBC:  Recent Labs Lab 08/09/16 2029 08/10/16 0613 08/12/16 0527  WBC 15.8* 17.5* 23.0*  NEUTROABS 14.1*  --  20.5*  HGB 10.6* 10.9* 9.5*  HCT 32.3* 33.0* 29.1*  MCV 81.2 81.5 82.0  PLT 270 262 [id number]   Basic Metabolic Panel:  Recent Labs Lab 08/09/16 2029 08/10/16 0613 08/12/16 0527  NA 132* 132* 134*  K 3.3* 4.0 4.7  CL 98* 100* 101  CO2 24 21* 26  GLUCOSE 96 150* 123*  BUN 23* 23* 29*  CREATININE 0.96 0.83 0.88  CALCIUM 10.1 9.9 10.0   GFR: Estimated Creatinine Clearance: 48.8 mL/min (by C-G formula based on SCr of 0.88 mg/dL). Liver Function Tests:  Recent Labs Lab 08/09/16 2029 08/12/16 0527  AST 100* 110*  ALT 80* 127*  ALKPHOS 93 88  BILITOT 1.2 1.0  PROT 6.6 5.9*  ALBUMIN 3.1* 2.7*   No results for input(s): LIPASE, AMYLASE in the last 168 hours. No results for input(s): AMMONIA in the last 168 hours. Coagulation Profile:  Recent Labs Lab 08/09/16 2029 08/10/16 0613 08/11/16 0604 08/12/16 0527  INR >10.00* >10.00* 4.88* 3.32   Cardiac Enzymes:  Recent Labs Lab 08/09/16 2029 08/10/16 0111 08/10/16 0613 08/10/16 1153  TROPONINI 0.08* 0.07* 0.08* 0.08*   BNP (last 3 results) No results for input(s): PROBNP in the last 8760 hours. HbA1C: No results for input(s): HGBA1C in the last 72 hours. CBG: No results for input(s): GLUCAP in the last 168 hours. Lipid Profile: No results for input(s): CHOL, HDL,  LDLCALC, TRIG, CHOLHDL, LDLDIRECT in the last 72 hours. Thyroid Function Tests: No results for input(s): TSH, T4TOTAL, FREET4, T3FREE, THYROIDAB in the last 72 hours. Anemia Panel: No results for input(s): VITAMINB12, FOLATE, FERRITIN, TIBC, IRON, RETICCTPCT in the last 72 hours. Urine analysis:    Component Value Date/Time   COLORURINE AMBER (A) 08/10/2016 0947   APPEARANCEUR CLOUDY (A) 08/10/2016 0947   LABSPEC 1.024 08/10/2016 0947   PHURINE 6.0 08/10/2016 0947   GLUCOSEU NEGATIVE 08/10/2016 0947   HGBUR TRACE (A) 08/10/2016 0947   BILIRUBINUR SMALL (A) 08/10/2016 0947   KETONESUR NEGATIVE 08/10/2016 0947   PROTEINUR 30 (A) 08/10/2016 0947   UROBILINOGEN 0.2 01/02/2015 1313   NITRITE NEGATIVE 08/10/2016 0947   LEUKOCYTESUR NEGATIVE 08/10/2016 0947    Recent Results (from the past 240 hour(s))  Blood Culture (routine  x 2)     Status: None (Preliminary result)   Collection Time: 08/09/16  8:29 PM  Result Value Ref Range Status   Specimen Description BLOOD RIGHT ANTECUBITAL  Final   Special Requests BOTTLES DRAWN AEROBIC AND ANAEROBIC 5CC EACH  Final   Culture   Final    NO GROWTH 2 DAYS Performed at [facility]    Report Status PENDING  Incomplete  Blood Culture (routine x 2)     Status: None (Preliminary result)   Collection Time: 08/09/16  8:29 PM  Result Value Ref Range Status   Specimen Description BLOOD RIGHT HAND  Final   Special Requests IN PEDIATRIC BOTTLE 2.5CC  Final   Culture   Final    NO GROWTH 2 DAYS Performed at [facility]    Report Status PENDING  Incomplete  Urine culture     Status: None   Collection Time: 08/10/16  9:47 AM  Result Value Ref Range Status   Specimen Description URINE, CATHETERIZED  Final   Special Requests NONE  Final   Culture NO GROWTH Performed at [facility]   Final   Report Status 08/11/2016 FINAL  Final      Anti-infectives    Start     Dose/Rate Route Frequency Ordered Stop   08/12/16 1000   doxycycline (VIBRA-TABS) tablet 100 mg     100 mg Oral Every 12 hours 08/11/16 1155     08/10/16 1000  vancomycin (VANCOCIN) IVPB 750 mg/150 ml premix     750 mg 150 mL/hr over 60 Minutes Intravenous Every 12 hours 08/09/16 2151 08/11/16 2245   08/10/16 0900  ceFEPIme (MAXIPIME) 1 g in dextrose 5 % 50 mL IVPB     1 g 100 mL/hr over 30 Minutes Intravenous Every 12 hours 08/09/16 2153 08/11/16 2117   08/09/16 2115  ceFEPIme (MAXIPIME) 2 g in dextrose 5 % 50 mL IVPB     2 g 100 mL/hr over 30 Minutes Intravenous  Once 08/09/16 2101 08/09/16 2158   08/09/16 2115  vancomycin (VANCOCIN) IVPB 1000 mg/200 mL premix     1,000 mg 200 mL/hr over 60 Minutes Intravenous  Once 08/09/16 2101 08/10/16 0037     Radiology Studies: No results found. Scheduled Meds: . arformoterol  15 mcg Nebulization BID  . aspirin EC  81 mg Oral Daily  . budesonide (PULMICORT) nebulizer solution  0.5 mg Nebulization BID  . carvedilol  3.125 mg Oral BID WC  . cyanocobalamin  500 mcg Oral Daily  . doxycycline  100 mg Oral Q12H  . fluticasone  2 spray Each Nare Daily  . isosorbide mononitrate  30 mg Oral Daily  . levalbuterol  0.63 mg Nebulization TID  . loratadine  10 mg Oral Daily  . losartan  50 mg Oral QPM  . pantoprazole  40 mg Oral Daily  . [START ON 08/13/2016] predniSONE  20 mg Oral Q breakfast  . simvastatin  10 mg Oral QHS  . traZODone  150 mg Oral QHS  . warfarin  1 mg Oral ONCE-1800  . Warfarin - Pharmacist Dosing Inpatient   Does not apply q1800   Continuous Infusions:    LOS: 3 days    Time spent: 27 min  Irwin Brakeman, MD [facility] Pager [phone]  If 7PM-7AM, please contact night-coverage [email or web address] Password [facility] 08/12/2016, 10:33 AM

## 2016-08-12 NOTE — Progress Notes (Signed)
[facility] for Warfarin Indication: atrial fibrillation  No Known Allergies  Patient Measurements: Height: 5\' 1"  (154.9 cm) Weight: 154 lb 12.2 oz (70.2 kg) IBW/kg (Calculated) : 47.8  Vital Signs: Temp: 98 F (36.7 C) (09/14 0514) Temp Source: Oral (09/14 0514) BP: 124/71 (09/14 0514) Pulse Rate: 87 (09/14 0514)  Labs:  Recent Labs  08/09/16 2029 08/10/16 0111 08/10/16 0613 08/10/16 1153 08/11/16 0604 08/12/16 0527  HGB 10.6*  --  10.9*  --   --  9.5*  HCT 32.3*  --  33.0*  --   --  29.1*  PLT 270  --  262  --   --  337  LABPROT >90.0*  --  >90.0*  --  46.9* 34.5*  INR >10.00*  --  >10.00*  --  4.88* 3.32  CREATININE 0.96  --  0.83  --   --  0.88  TROPONINI 0.08* 0.07* 0.08* 0.08*  --   --     Estimated Creatinine Clearance: 48.8 mL/min (by C-G formula based on SCr of 0.88 mg/dL).   Medications:  Scheduled:  . arformoterol  15 mcg Nebulization BID  . aspirin EC  81 mg Oral Daily  . budesonide (PULMICORT) nebulizer solution  0.5 mg Nebulization BID  . carvedilol  3.125 mg Oral BID WC  . cyanocobalamin  500 mcg Oral Daily  . doxycycline  100 mg Oral Q12H  . fluticasone  2 spray Each Nare Daily  . isosorbide mononitrate  30 mg Oral Daily  . levalbuterol  0.63 mg Nebulization TID  . loratadine  10 mg Oral Daily  . losartan  50 mg Oral QPM  . pantoprazole  40 mg Oral Daily  . predniSONE  60 mg Oral Q breakfast  . simvastatin  10 mg Oral QHS  . traZODone  150 mg Oral QHS  . Warfarin - Pharmacist Dosing Inpatient   Does not apply q1800   Assessment: Cindy [name] is a 75 y.o. female admitted on 08/09/2016 with pneumonia and known to pharmacy from Vancomycin & Cefepime dosing.  PMH also includes warfarin for hx of Afib, hx CVA.  Pharmacy is consulted to dose warfarin.   PTA Warfarin 4mg  daily except 6mg  on Sunday, last dose 9/11 17:00.  INR upon arrival in ED > 10.  Vitamin K 5mg  PO x 1 given @ 23:47  Today,  08/12/2016:  INR 3.32, supra-therapeutic but decreasing.  After holding x2 days, resume warfarin today at LOW dose to prevent INR from decreasing the sub-therapeutic levels.  CBC: Hgb decreased to 9.5, Plt remain WNL  No bleeding reported or documented  Diet: heart healthy  Drug-drug interactions: Vit K may suppress INR for several days, Doxycycline may increase INR.  Goal of Therapy:  INR 2-3  Plan:  Low dose warfarin 1mg  PO today at 1800. Daily PT/INR. Monitor for signs and symptoms of bleeding.   Gretta Arab PharmD, BCPS Pager [phone] 08/12/2016 7:07 AM

## 2016-08-13 ENCOUNTER — Inpatient Hospital Stay (HOSPITAL_COMMUNITY): Payer: Medicare Other

## 2016-08-13 LAB — PROTIME-INR
INR: 2
Prothrombin Time: 23 seconds — ABNORMAL HIGH (ref 11.4–15.2)

## 2016-08-13 MED ORDER — WARFARIN SODIUM 2 MG PO TABS
2.0000 mg | ORAL_TABLET | Freq: Once | ORAL | Status: AC
  Administered 2016-08-13: 2 mg via ORAL
  Filled 2016-08-13: qty 1

## 2016-08-13 NOTE — Care Management Important Message (Signed)
Important Message  Patient Details  Name: Cindy [name] MRN: [id number] Date of Birth: [birth year]   Medicare Important Message Given:  Yes    Camillo Flaming 08/13/2016, 10:18 AM

## 2016-08-13 NOTE — Progress Notes (Signed)
PROGRESS NOTE    Cindy [name]  [id number] DOB: Jul 18, 1941 DOA: 08/09/2016 PCP: [name], MD   Outpatient Specialists:   Brief Narrative:  Cindy [name] is a 75 y.o. female with medical history significant of CVA following carotid endarterectomy, atrial fibrillation on chronic anticoagulation, HTN, anxiety/depression, gerd; who presents with complaints of shortness of breath progressively worsening over the last 3 days. Patient is  a poor historian.  She was noted to have a productive cough with associated symptoms included shortness of breath, wheezing, nausea, vomiting, and diaphoresis. She was noted to have had a fever of 101F. Patient then treated with home inhalers without relief of symptoms.  Upon admission to the emergency department patient was noted to be febrile to 100.64F, pulse of up to 137, respirations up to 28, O2 saturation as low as 91% on 2 L nasal cannula oxygen. Lab work revealed WBC 15.8, hemoglobin 10.6, sodium 132, potassium 3.3, chloride 98, BUN 23, INR >10, and troponin 0.08. Chest x-ray revealed a left lower lobe pneumonia with small left pleural effusion. Sepsis protocol initiated and patient was started on empiric antibiotics of vancomycin and cefepime.    Assessment & Plan:   Principal Problem:   Sepsis ([place]) Active Problems:   Asthma   A-fib (HCC)   GERD (gastroesophageal reflux disease)   Anxiety   Hypertension   HCAP (healthcare-associated pneumonia)  Sepsis secondary to HCAP:  - Follow-up cultures- blood/sputum: NGTD - Empiric antibiotics of vancomycin and cefepime, de-escalated 9/14 - Tylenol prn fever   Asthma exacerbation: acute. Clinically improving - Budesonide and Brovana nebs - Duonebs q QID and prn SOB/Wheezing - Prednisone being tapered - Mucinex ordered.  - oxygen weaned down to room air  Urinary retention -I/o x 1 -periodic bladder scan -seems to be voiding much better now  Atrial fibrillation with supratherapeutic INR:  initial INR greater than 10. Chadsvasc score = 6 - s/p vitamin K 5 mg oral -- no signs of bleeding--daily INR, INR now therapeutic, appreciate pharmacy assistance - holding Coumadin per pharmacy, restarting per pharmacy as INR is trending down to therapeutic 9/14 Pt having RVR and not on any rate limiting medications, started low dose carvedilol to control rate and has improved  Elevated troponins: Acute. Troponin 0.08 on admission  -suspect demand ischemia, no chest pain - Trend cardiac troponin remained stable  Coronary artery disease - Continue aspirin and isosorbide mononitrate  Essential hypertension - Continue losartan   Anxiety/depression - Continue Klonopin prn -d/c zoloft due to Qtc  Hyperlipidemia  - Continue simvastatin   Hypokalemia - repleted - Continue to monitor and replace as needed  Hyponatremia:  Sodium 132 on admission suspect symptoms could be secondary to nausea and vomiting. - improved with Gentle IV fluid hydration  Mild Acute CHF - improved now -Echo reveals mild systolic dysfunction and EF 40-45%.  Added coreg, already on ARB (losartan)  History CVA PT Eval pending to help determine disposition ALF vs SNF  GERD  - Pharmacy substitution of Protonix   Evaluate for dysphagia - I asked for SLP to see patient to be sure and evaluate her fully.  DVT prophylaxis:  Fully anticoagulated  Code Status: Full Code  Family Communication: Daughter was at bedside 9/14  Disposition Plan:  DC to SNF tomorrow if continues to remain stable  Subjective: She is more alert today and no complaints  Objective: Vitals:   08/13/16 0500 08/13/16 0800 08/13/16 0801 08/13/16 0802  BP:      Pulse:  Resp:      Temp:      TempSrc:      SpO2:  100% 100% 100%  Weight: 69.1 kg (152 lb 5.4 oz)     Height:        Intake/Output Summary (Last 24 hours) at 08/13/16 1029 Last data filed at 08/13/16 0929  Gross per 24 hour  Intake               60 ml    Output                0 ml  Net               60 ml   Filed Weights   08/11/16 0642 08/12/16 0514 08/13/16 0500  Weight: 72.1 kg (159 lb) 70.2 kg (154 lb 12.2 oz) 69.1 kg (152 lb 5.4 oz)    Examination:  General exam: improved appearing, more alert and interactive, NAD.  Respiratory system: no wheezing heard, good air movement, occasional dry cough. Cardiovascular system: irregularly irregular, normal s1, s2 Gastrointestinal system:  No organomegaly or masses felt. Normal bowel sounds heard. Central nervous system: Awake and alert.  Data Reviewed: I have personally reviewed following labs and imaging studies  CBC:  Recent Labs Lab 08/09/16 2029 08/10/16 0613 08/12/16 0527  WBC 15.8* 17.5* 23.0*  NEUTROABS 14.1*  --  20.5*  HGB 10.6* 10.9* 9.5*  HCT 32.3* 33.0* 29.1*  MCV 81.2 81.5 82.0  PLT 270 262 [id number]   Basic Metabolic Panel:  Recent Labs Lab 08/09/16 2029 08/10/16 0613 08/12/16 0527  NA 132* 132* 134*  K 3.3* 4.0 4.7  CL 98* 100* 101  CO2 24 21* 26  GLUCOSE 96 150* 123*  BUN 23* 23* 29*  CREATININE 0.96 0.83 0.88  CALCIUM 10.1 9.9 10.0   GFR: Estimated Creatinine Clearance: 48.3 mL/min (by C-G formula based on SCr of 0.88 mg/dL). Liver Function Tests:  Recent Labs Lab 08/09/16 2029 08/12/16 0527  AST 100* 110*  ALT 80* 127*  ALKPHOS 93 88  BILITOT 1.2 1.0  PROT 6.6 5.9*  ALBUMIN 3.1* 2.7*   No results for input(s): LIPASE, AMYLASE in the last 168 hours. No results for input(s): AMMONIA in the last 168 hours. Coagulation Profile:  Recent Labs Lab 08/09/16 2029 08/10/16 0613 08/11/16 0604 08/12/16 0527 08/13/16 0543  INR >10.00* >10.00* 4.88* 3.32 2.00   Cardiac Enzymes:  Recent Labs Lab 08/09/16 2029 08/10/16 0111 08/10/16 0613 08/10/16 1153  TROPONINI 0.08* 0.07* 0.08* 0.08*   BNP (last 3 results) No results for input(s): PROBNP in the last 8760 hours. HbA1C: No results for input(s): HGBA1C in the last 72 hours. CBG: No  results for input(s): GLUCAP in the last 168 hours. Lipid Profile: No results for input(s): CHOL, HDL, LDLCALC, TRIG, CHOLHDL, LDLDIRECT in the last 72 hours. Thyroid Function Tests: No results for input(s): TSH, T4TOTAL, FREET4, T3FREE, THYROIDAB in the last 72 hours. Anemia Panel: No results for input(s): VITAMINB12, FOLATE, FERRITIN, TIBC, IRON, RETICCTPCT in the last 72 hours. Urine analysis:    Component Value Date/Time   COLORURINE AMBER (A) 08/10/2016 0947   APPEARANCEUR CLOUDY (A) 08/10/2016 0947   LABSPEC 1.024 08/10/2016 0947   PHURINE 6.0 08/10/2016 0947   GLUCOSEU NEGATIVE 08/10/2016 0947   HGBUR TRACE (A) 08/10/2016 0947   BILIRUBINUR SMALL (A) 08/10/2016 0947   KETONESUR NEGATIVE 08/10/2016 0947   PROTEINUR 30 (A) 08/10/2016 0947   UROBILINOGEN 0.2 01/02/2015 1313   NITRITE NEGATIVE 08/10/2016 0947  LEUKOCYTESUR NEGATIVE 08/10/2016 0947    Recent Results (from the past 240 hour(s))  Blood Culture (routine x 2)     Status: None (Preliminary result)   Collection Time: 08/09/16  8:29 PM  Result Value Ref Range Status   Specimen Description BLOOD RIGHT ANTECUBITAL  Final   Special Requests BOTTLES DRAWN AEROBIC AND ANAEROBIC 5CC EACH  Final   Culture   Final    NO GROWTH 3 DAYS Performed at [facility]    Report Status PENDING  Incomplete  Blood Culture (routine x 2)     Status: None (Preliminary result)   Collection Time: 08/09/16  8:29 PM  Result Value Ref Range Status   Specimen Description BLOOD RIGHT HAND  Final   Special Requests IN PEDIATRIC BOTTLE 2.5CC  Final   Culture   Final    NO GROWTH 3 DAYS Performed at [facility]    Report Status PENDING  Incomplete  Urine culture     Status: None   Collection Time: 08/10/16  9:47 AM  Result Value Ref Range Status   Specimen Description URINE, CATHETERIZED  Final   Special Requests NONE  Final   Culture NO GROWTH Performed at [facility]   Final   Report Status 08/11/2016 FINAL   Final      Anti-infectives    Start     Dose/Rate Route Frequency Ordered Stop   08/12/16 1000  doxycycline (VIBRA-TABS) tablet 100 mg     100 mg Oral Every 12 hours 08/11/16 1155     08/10/16 1000  vancomycin (VANCOCIN) IVPB 750 mg/150 ml premix     750 mg 150 mL/hr over 60 Minutes Intravenous Every 12 hours 08/09/16 2151 08/11/16 2245   08/10/16 0900  ceFEPIme (MAXIPIME) 1 g in dextrose 5 % 50 mL IVPB     1 g 100 mL/hr over 30 Minutes Intravenous Every 12 hours 08/09/16 2153 08/11/16 2117   08/09/16 2115  ceFEPIme (MAXIPIME) 2 g in dextrose 5 % 50 mL IVPB     2 g 100 mL/hr over 30 Minutes Intravenous  Once 08/09/16 2101 08/09/16 2158   08/09/16 2115  vancomycin (VANCOCIN) IVPB 1000 mg/200 mL premix     1,000 mg 200 mL/hr over 60 Minutes Intravenous  Once 08/09/16 2101 08/10/16 0037     Radiology Studies: No results found. Scheduled Meds: . arformoterol  15 mcg Nebulization BID  . aspirin EC  81 mg Oral Daily  . budesonide (PULMICORT) nebulizer solution  0.5 mg Nebulization BID  . carvedilol  3.125 mg Oral BID WC  . cyanocobalamin  500 mcg Oral Daily  . doxycycline  100 mg Oral Q12H  . fluticasone  2 spray Each Nare Daily  . isosorbide mononitrate  30 mg Oral Daily  . levalbuterol  0.63 mg Nebulization TID  . loratadine  10 mg Oral Daily  . losartan  50 mg Oral QPM  . pantoprazole  40 mg Oral Daily  . predniSONE  20 mg Oral Q breakfast  . simvastatin  10 mg Oral QHS  . traZODone  150 mg Oral QHS  . warfarin  2 mg Oral ONCE-1800  . Warfarin - Pharmacist Dosing Inpatient   Does not apply q1800   Continuous Infusions:    LOS: 4 days   Time spent: 22 min  Irwin Brakeman, MD [facility] Pager [phone]  If 7PM-7AM, please contact night-coverage [email or web address] Password [employer] 08/13/2016, 10:29 AM

## 2016-08-13 NOTE — Evaluation (Signed)
Physical Therapy Evaluation Patient Details Name: Cindy [name] MRN: [id number] DOB: [birth year] Today's Date: 08/13/2016   History of Present Illness  75 y.o. female admitted with sepsis, PNA. PMH of CVA, afib, HTN, asthma, GERD.   Clinical Impression  Pt admitted with above diagnosis. Pt currently with functional limitations due to the deficits listed below (see PT Problem List). Mod assist for bed mobility, transfers, and to ambulate a few steps to recliner. Assistance is primarily due to decreased cognition, pt requires manual cues to follow instructions.  Pt will benefit from skilled PT to increase their independence and safety with mobility to allow discharge to the venue listed below.       Follow Up Recommendations SNF;Supervision/Assistance - 24 hour;Supervision for mobility/OOB    Equipment Recommendations  None recommended by PT    Recommendations for Other Services       Precautions / Restrictions Precautions Precautions: Fall Restrictions Weight Bearing Restrictions: No      Mobility  Bed Mobility Overal bed mobility: Needs Assistance Bed Mobility: Supine to Sit     Supine to sit: Max assist     General bed mobility comments: Max A to raise trunk and pivot hips  Transfers Overall transfer level: Needs assistance Equipment used: Rolling walker (2 wheeled) Transfers: Sit to/from Stand Sit to Stand: Mod assist         General transfer comment: Mod A to rise, pt did not seem familiar with RW so removed it  Ambulation/Gait Ambulation/Gait assistance: Mod assist Ambulation Distance (Feet): 3 Feet Assistive device: 1 person hand held assist Gait Pattern/deviations: Step-to pattern;Decreased stride length   Gait velocity interpretation: Below normal speed for age/gender General Gait Details: several pivotal steps to recliner with mod A to guide movement as pt did not initiate taking any steps when instructed to walk, pt seems unfamiliar with RW but reached  for armrests of recliner for UE support  Stairs            Wheelchair Mobility    Modified Rankin (Stroke Patients Only)       Balance Overall balance assessment: Needs assistance   Sitting balance-Leahy Scale: Fair       Standing balance-Leahy Scale: Fair                               Pertinent Vitals/Pain Pain Assessment: No/denies pain    Home Living Family/patient expects to be discharged to:: Assisted living                 Additional Comments: pt poor historian, unable to provide this information    Prior Function           Comments: unknown, pt poor historian     Hand Dominance        Extremity/Trunk Assessment   Upper Extremity Assessment: Generalized weakness           Lower Extremity Assessment: Generalized weakness         Communication   Communication: No difficulties  Cognition Arousal/Alertness: Awake/alert Behavior During Therapy: Flat affect Overall Cognitive Status: No family/caregiver present to determine baseline cognitive functioning (couldn't state birthdate, requires manual/verbal cues to follow commands, poor historian)       Memory: Decreased short-term memory              General Comments      Exercises     Assessment/Plan    PT Assessment Patient needs continued  PT services  PT Problem List Decreased activity tolerance;Decreased mobility;Decreased safety awareness;Decreased cognition;Decreased knowledge of use of DME       PT Diagnosis   Other abnormalities of gait and mobility  HCAP (healthcare-associated pneumonia)    PT Treatment Interventions Gait training;Balance training;Therapeutic exercise;Therapeutic activities;Patient/family education    PT Goals (Current goals can be found in the Care Plan section)  Acute Rehab PT Goals PT Goal Formulation: Patient unable to participate in goal setting Time For Goal Achievement: 08/27/16 Potential to Achieve Goals: Fair     Frequency Min 3X/week   Barriers to discharge        Co-evaluation               End of Session Equipment Utilized During Treatment: Gait belt Activity Tolerance: Patient tolerated treatment well Patient left: in chair;with call bell/phone within reach;with chair alarm set Nurse Communication: Mobility status;Other (comment) (gown saturated in urine)         Time: 1348-1400 PT Time Calculation (min) (ACUTE ONLY): 12 min   Charges:   PT Evaluation $PT Eval Moderate Complexity: 1 Procedure     PT G Codes:        Blondell Reveal Kistler 08/13/2016, 2:14 PM [phone]

## 2016-08-13 NOTE — Procedures (Signed)
Objective Swallowing Evaluation: Type of Study: Bedside Swallow Evaluation  Patient Details  Name: Cindy [name] MRN: [id number] Date of Birth: Jun 24, 1941  Today's Date: 08/13/2016 Time: SLP Start Time (ACUTE ONLY): 1226-SLP Stop Time (ACUTE ONLY): 1245 SLP Time Calculation (min) (ACUTE ONLY): 19 min  Past Medical History:  Past Medical History:  Diagnosis Date  . Angina pectoris ([place])   . Anxiety   . Asthma   . Barrett's esophagus   . Bronchitis   . 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 Chest pain   . Coronary artery disease    right carotid occlusion   . Depression   . Dysrhythmia    atrial fibrillation  . GERD (gastroesophageal reflux disease)   . Hyperlipidemia   . Hypertension   . Recurrent upper respiratory infection (URI)   . Stroke [facility])    2007, after left endarterectomy   Past Surgical History:  Past Surgical History:  Procedure Laterality Date  . ABDOMINAL HYSTERECTOMY    . BALLOON DILATION  10/18/2012   Procedure: BALLOON DILATION;  Surgeon: Lear Ng, MD;  Location: [facility];  Service: Endoscopy;  Laterality: N/A;  . CARDIAC CATHETERIZATION    . CAROTID ENDARTERECTOMY     left, complicated by stroke  . CHOLECYSTECTOMY     incidental at time of colectomy  . COLONOSCOPY  12/01/2011   Procedure: COLONOSCOPY;  Surgeon: Lear Ng, MD;  Location: [facility];  Service: Endoscopy;  Laterality: N/A;  . COLONOSCOPY  10/18/2012   Procedure: COLONOSCOPY;  Surgeon: Lear Ng, MD;  Location: [facility];  Service: Endoscopy;  Laterality: N/A;  colonic dilation  . ESOPHAGOGASTRODUODENOSCOPY  12/01/2011   Procedure: ESOPHAGOGASTRODUODENOSCOPY (EGD);  Surgeon: Lear Ng, MD;  Location: [facility];  Service: Endoscopy;  Laterality: N/A;  . HOT HEMOSTASIS  12/01/2011   Procedure: HOT HEMOSTASIS (ARGON PLASMA COAGULATION/BICAP);  Surgeon: Lear Ng, MD;  Location: [facility];  Service: Endoscopy;  Laterality: N/A;  . 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  PMH + for cva, Barrett's esophagus, HTN, anxiety, depression.  PSH includes CEA.  Pt with right facial weakness likely due to prior cva.  Pt also with lordosis and spurring at C5-C6, C6-C7.  She has been having difficulty swallowing and speech/swallow eval ordered.  From chart review, RN report of deficits and suspected pna -  MBS indicated.    Subjective: pt awake in chair Visit Diagnosis:  HCAP (healthcare-associated pneumonia)  Assessment / Plan / Recommendation  CHL IP CLINICAL IMPRESSIONS 08/13/2016  Therapy Diagnosis Mild oral phase dysphagia;Mild pharyngeal phase dysphagia  Clinical Impression Mild oropharyngeal dysphagia without aspiration or deep penetration of any consistency tested.  Decreased oral coordination resulted in premature spillage of barium into pharynx = poorly controlled.  Pharyngeal swallow was delayed to pyriform sinus with thin liquid via cup allowing trace upper laryngeal penetration.  Cued cough cleared TRACE penetrates.  Pharyngeal swallow was strong without residuals pt complained of sensation of residuals at distal esophagus, could not view during testing due to pt's stature.  Suspect given pt's h/o Barrett's and reflux, her primary aspiration risk if from known esophageal deficits.  Recommend dys3/thin due to pt's oral deficits/poor dentition.  Of note, pt was fidgety during entire test.    Impact on safety and function Mild aspiration risk      CHL IP TREATMENT RECOMMENDATION 08/13/2016  Treatment Recommendations Therapy as outlined in treatment plan below     Prognosis 08/13/2016  Prognosis for Safe Diet Advancement Fair  Barriers to Reach Goals Cognitive deficits  Barriers/Prognosis Comment --    CHL IP DIET RECOMMENDATION 08/13/2016  SLP Diet Recommendations Dysphagia 3 (Mech soft) solids;Thin liquid  Liquid Administration via  Cup;Straw  Medication Administration Whole meds with puree  Compensations Slow rate;Small sips/bites;Clear throat intermittently  Postural Changes Remain semi-upright after after feeds/meals (Comment);Seated upright at 90 degrees      CHL IP OTHER RECOMMENDATIONS 08/13/2016  Recommended Consults Consider esophageal assessment  Oral Care Recommendations Oral care BID  Other Recommendations --      No flowsheet data found.    CHL IP FREQUENCY AND DURATION 08/13/2016  Speech Therapy Frequency (ACUTE ONLY) min 1 x/week  Treatment Duration 1 week           CHL IP ORAL PHASE 08/13/2016  Oral Phase Impaired  Oral - Pudding Teaspoon --  Oral - Pudding Cup --  Oral - Honey Teaspoon --  Oral - Honey Cup --  Oral - Nectar Teaspoon --  Oral - Nectar Cup Weak lingual manipulation;Premature spillage  Oral - Nectar Straw Weak lingual manipulation;Premature spillage  Oral - Thin Teaspoon Weak lingual manipulation;Premature spillage  Oral - Thin Cup Weak lingual manipulation;Premature spillage  Oral - Thin Straw --  Oral - Puree Weak lingual manipulation;Premature spillage  Oral - Mech Soft --  Oral - Regular Weak lingual manipulation;Premature spillage  Oral - Multi-Consistency --  Oral - Pill --  Oral Phase - Comment --    CHL IP PHARYNGEAL PHASE 08/13/2016  Pharyngeal Phase Impaired  Pharyngeal- Pudding Teaspoon --  Pharyngeal --  Pharyngeal- Pudding Cup --  Pharyngeal --  Pharyngeal- Honey Teaspoon --  Pharyngeal --  Pharyngeal- Honey Cup --  Pharyngeal --  Pharyngeal- Nectar Teaspoon Delayed swallow initiation-vallecula  Pharyngeal --  Pharyngeal- Nectar Cup Delayed swallow initiation-vallecula  Pharyngeal --  Pharyngeal- Nectar Straw Delayed swallow initiation-vallecula  Pharyngeal --  Pharyngeal- Thin Teaspoon Delayed swallow initiation-vallecula;Delayed swallow initiation-pyriform sinuses  Pharyngeal --  Pharyngeal- Thin Cup Delayed swallow initiation-vallecula;Delayed  swallow initiation-pyriform sinuses  Pharyngeal --  Pharyngeal- Thin Straw --  Pharyngeal --  Pharyngeal- Puree Delayed swallow initiation-vallecula  Pharyngeal --  Pharyngeal- Mechanical Soft --  Pharyngeal --  Pharyngeal- Regular --  Pharyngeal --  Pharyngeal- Multi-consistency --  Pharyngeal --  Pharyngeal- Pill --  Pharyngeal --  Pharyngeal Comment --     CHL IP CERVICAL ESOPHAGEAL PHASE 08/13/2016  Cervical Esophageal Phase WFL  Pudding Teaspoon --  Pudding Cup --  Honey Teaspoon --  Honey Cup --  Nectar Teaspoon --  Nectar Cup --  Nectar Straw --  Thin Teaspoon --  Thin Cup --  Thin Straw --  Puree --  Mechanical Soft --  Regular --  Multi-consistency --  Pill --  Cervical Esophageal Comment --    No flowsheet data found.  Macario Golds 08/13/2016, 1:05 PM                        Luanna Salk, [facility] SLP [phone]

## 2016-08-13 NOTE — NC FL2 (Signed)
[place] MEDICAID FL2 LEVEL OF CARE SCREENING TOOL     IDENTIFICATION  Patient Name: Cindy [name] Birthdate: 1941/05/27 Sex: female Admission Date (Current Location): 08/09/2016  [facility] and [place] Number:  Herbalist and Address:  [facility],  [place] [address] [place]      Provider Number: [id number]  Attending Physician Name and Address:  Murlean Iba, MD  Relative Name and Phone Number:       Current Level of Care: Hospital Recommended Level of Care: [place] Prior Approval Number:    Date Approved/Denied:   PASRR Number:    Discharge Plan: SNF    Current Diagnoses: Patient Active Problem List   Diagnosis Date Noted  . HCAP (healthcare-associated pneumonia) 08/10/2016  . Sepsis ([place]) 08/09/2016  . Coronary atherosclerosis of native coronary artery 01/21/2014  . Obesity 01/17/2014  . Abdominal pain, generalized 10/18/2012  . GERD (gastroesophageal reflux disease)   . Depression   . Anxiety   . Hypertension   . Stroke ([place])   . Chest pain 01/07/2012  . Asthma 01/07/2012  . A-fib ([place]) 01/07/2012  . CVA (cerebral infarction) 01/07/2012  . Anemia 01/07/2012  . ANEMIA 07/29/2010  . BARRETTS ESOPHAGUS 06/04/2010    Orientation RESPIRATION BLADDER Height & Weight     Self  O2 (3L) Incontinent Weight: 152 lb 5.4 oz (69.1 kg) Height:  5\' 1"  (154.9 cm)  BEHAVIORAL SYMPTOMS/MOOD NEUROLOGICAL BOWEL NUTRITION STATUS      Incontinent Diet (Heart Healthy)  AMBULATORY STATUS COMMUNICATION OF NEEDS Skin   Limited Assist Verbally Normal                       Personal Care Assistance Level of Assistance  Bathing, Feeding, Dressing Bathing Assistance: Limited assistance Feeding assistance: Independent Dressing Assistance: Limited assistance     Functional Limitations Info  Sight, Hearing, Speech Sight Info: Adequate Hearing Info: Adequate Speech Info: Adequate    SPECIAL CARE FACTORS FREQUENCY   Speech therapy     PT Frequency: 5       Speech Therapy Frequency: 5      Contractures Contractures Info: Not present    Additional Factors Info  Code Status, Allergies Code Status Info: Full Allergies Info: No Known Allergies           Current Medications (08/13/2016):  This is the current hospital active medication list Current Facility-Administered Medications  Medication Dose Route Frequency Provider Last Rate Last Dose  . acetaminophen (TYLENOL) tablet 650 mg  650 mg Oral Q6H PRN Norval Morton, MD   650 mg at 08/11/16 1901   Or  . acetaminophen (TYLENOL) suppository 650 mg  650 mg Rectal Q6H PRN Norval Morton, MD      . arformoterol (BROVANA) nebulizer solution 15 mcg  15 mcg Nebulization BID Norval Morton, MD   15 mcg at 08/13/16 0756  . aspirin EC tablet 81 mg  81 mg Oral Daily Geradine Girt, DO   81 mg at 08/13/16 1325  . baclofen (LIORESAL) tablet 10 mg  10 mg Oral BID PRN Norval Morton, MD      . budesonide (PULMICORT) nebulizer solution 0.5 mg  0.5 mg Nebulization BID Norval Morton, MD   0.5 mg at 08/13/16 0756  . carvedilol (COREG) tablet 3.125 mg  3.125 mg Oral BID WC Clanford Marisa Hua, MD   3.125 mg at 08/13/16 0839  . clonazePAM (KLONOPIN) tablet 0.25 mg  0.25 mg Oral BID PRN Norval Morton, MD   0.25 mg at 08/11/16 2139  . cyanocobalamin tablet 500 mcg  500 mcg Oral Daily Norval Morton, MD   500 mcg at 08/13/16 1324  . doxycycline (VIBRA-TABS) tablet 100 mg  100 mg Oral Q12H Clanford Marisa Hua, MD   100 mg at 08/13/16 1322  . fluticasone (FLONASE) 50 MCG/ACT nasal spray 2 spray  2 spray Each Nare Daily Norval Morton, MD   2 spray at 08/13/16 1325  . guaiFENesin (ROBITUSSIN) 100 MG/5ML solution 200 mg  200 mg Oral Q6H PRN Norval Morton, MD   200 mg at 08/12/16 1612  . ipratropium-albuterol (DUONEB) 0.5-2.5 (3) MG/3ML nebulizer solution 3 mL  3 mL Nebulization Q2H PRN Norval Morton, MD   3 mL at 08/10/16 0011  . isosorbide mononitrate (IMDUR)  24 hr tablet 30 mg  30 mg Oral Daily Norval Morton, MD   30 mg at 08/13/16 1324  . levalbuterol (XOPENEX) nebulizer solution 0.63 mg  0.63 mg Nebulization TID Clanford Marisa Hua, MD   0.63 mg at 08/13/16 0759  . loratadine (CLARITIN) tablet 10 mg  10 mg Oral Daily Norval Morton, MD   10 mg at 08/13/16 1322  . losartan (COZAAR) tablet 50 mg  50 mg Oral QPM Rondell Charmayne Sheer, MD   50 mg at 08/12/16 1825  . magnesium hydroxide (MILK OF MAGNESIA) suspension 5 mL  5 mL Oral Daily PRN Geradine Girt, DO   5 mL at 08/11/16 1606  . nitroGLYCERIN (NITROSTAT) SL tablet 0.4 mg  0.4 mg Sublingual Q5 min PRN Norval Morton, MD      . ondansetron (ZOFRAN) tablet 4 mg  4 mg Oral Q6H PRN Norval Morton, MD       Or  . ondansetron (ZOFRAN) injection 4 mg  4 mg Intravenous Q6H PRN Norval Morton, MD   4 mg at 08/10/16 1434  . pantoprazole (PROTONIX) EC tablet 40 mg  40 mg Oral Daily Norval Morton, MD   40 mg at 08/13/16 1326  . phenol (CHLORASEPTIC) mouth spray 5 spray  5 spray Mouth/Throat Q2H PRN Rondell A Tamala Julian, MD      . polyethylene glycol (MIRALAX / GLYCOLAX) packet 17 g  17 g Oral Daily PRN Geradine Girt, DO      . predniSONE (DELTASONE) tablet 20 mg  20 mg Oral Q breakfast Clanford Marisa Hua, MD   20 mg at 08/13/16 0839  . simvastatin (ZOCOR) tablet 10 mg  10 mg Oral QHS Norval Morton, MD   10 mg at 08/12/16 2259  . traMADol (ULTRAM) tablet 50 mg  50 mg Oral Q6H PRN Geradine Girt, DO   50 mg at 08/12/16 0858  . traZODone (DESYREL) tablet 150 mg  150 mg Oral QHS Geradine Girt, DO   150 mg at 08/12/16 2259  . warfarin (COUMADIN) tablet 2 mg  2 mg Oral ONCE-1800 Christine E Shade, RPH      . Warfarin - Pharmacist Dosing Inpatient   Does not apply q1800 Nilda Simmer, RPH   Stopped at 08/10/16 1800     Discharge Medications: Please see discharge summary for a list of discharge medications.  Relevant Imaging Results:  Relevant Lab Results:   Additional Information ss#  [id number]  Lia Hopping, LCSW

## 2016-08-13 NOTE — Progress Notes (Signed)
PT Cancellation Note  Patient Details Name: Cindy [name] MRN: [id number] DOB: [birth year]   Cancelled Treatment:    Reason Eval/Treat Not Completed: Patient at procedure or test/unavailable (pt about to leave for swallow study. Will attempt later today. )   Philomena Doheny 08/13/2016, 11:11 AM [phone]

## 2016-08-13 NOTE — Progress Notes (Signed)
[facility] for Warfarin Indication: atrial fibrillation  No Known Allergies  Patient Measurements: Height: 5\' 1"  (154.9 cm) Weight: 152 lb 5.4 oz (69.1 kg) IBW/kg (Calculated) : 47.8  Vital Signs: Temp: 97.8 F (36.6 C) (09/15 0414) Temp Source: Oral (09/15 0414) BP: 144/89 (09/15 0414) Pulse Rate: 86 (09/15 0414)  Labs:  Recent Labs  08/10/16 1153 08/11/16 0604 08/12/16 0527 08/13/16 0543  HGB  --   --  9.5*  --   HCT  --   --  29.1*  --   PLT  --   --  337  --   LABPROT  --  46.9* 34.5* 23.0*  INR  --  4.88* 3.32 2.00  CREATININE  --   --  0.88  --   TROPONINI 0.08*  --   --   --     Estimated Creatinine Clearance: 48.3 mL/min (by C-G formula based on SCr of 0.88 mg/dL).   Medications:  Scheduled:  . arformoterol  15 mcg Nebulization BID  . aspirin EC  81 mg Oral Daily  . budesonide (PULMICORT) nebulizer solution  0.5 mg Nebulization BID  . carvedilol  3.125 mg Oral BID WC  . cyanocobalamin  500 mcg Oral Daily  . doxycycline  100 mg Oral Q12H  . fluticasone  2 spray Each Nare Daily  . isosorbide mononitrate  30 mg Oral Daily  . levalbuterol  0.63 mg Nebulization TID  . loratadine  10 mg Oral Daily  . losartan  50 mg Oral QPM  . pantoprazole  40 mg Oral Daily  . predniSONE  20 mg Oral Q breakfast  . simvastatin  10 mg Oral QHS  . traZODone  150 mg Oral QHS  . Warfarin - Pharmacist Dosing Inpatient   Does not apply q1800   Assessment: Cindy [name] is a 75 y.o. female admitted on 08/09/2016 with pneumonia and known to pharmacy from Vancomycin & Cefepime dosing.  PMH also includes warfarin for hx of Afib, hx CVA.  Pharmacy is consulted to dose warfarin.   PTA Warfarin 4mg  daily except 6mg  on Sunday, last dose 9/11 17:00.  INR upon arrival in ED > 10.  Vitamin K 5mg  PO x 1 given @ 23:47  Today, 08/13/2016:  INR 2, decreased to therapeutic.  After holding x2 days, resumed low dose warfarin on 9/14.  CBC: Hgb  decreased to 9.5, Plt remain WNL (9/14)  No bleeding reported or documented  Diet: heart healthy  Drug-drug interactions: Doxycycline may increase INR.  Goal of Therapy:  INR 2-3  Plan:   Warfarin 2 mg PO today at 1800.  Daily PT/INR.  Monitor for signs and symptoms of bleeding.   Gretta Arab PharmD, BCPS Pager [phone] 08/13/2016 7:15 AM

## 2016-08-13 NOTE — Progress Notes (Signed)
Order for swallow evaluation received, will defer clinical evaluation due to pt's overt difficulties, h/o CVA and current pulmonary status and proceed with MBS this am.  RN and xray informed. RN stated she would inform MD as he is on the floor currently.   Thanks. Luanna Salk, [place] [facility] SLP [phone]

## 2016-08-13 NOTE — Progress Notes (Addendum)
LCSWA spoke with patient daughter-[name] [phone]. Discussed SNF process vs. ALF and patient current status. Daughter expressed concerns with the patient ambulating and "holding food in her mouth". PT/ST will assist to determine the patient disposition at this time.

## 2016-08-14 LAB — CULTURE, BLOOD (ROUTINE X 2)
CULTURE: NO GROWTH
CULTURE: NO GROWTH

## 2016-08-14 LAB — PROTIME-INR
INR: 1.48
Prothrombin Time: 18.1 seconds — ABNORMAL HIGH (ref 11.4–15.2)

## 2016-08-14 MED ORDER — ACETAMINOPHEN 325 MG PO TABS: 650.0000 mg | ORAL_TABLET | Freq: Four times a day (QID) | ORAL | Status: DC | PRN

## 2016-08-14 MED ORDER — PREDNISONE 20 MG PO TABS: 20.0000 mg | ORAL_TABLET | Freq: Every day | ORAL | 0 refills | Status: AC

## 2016-08-14 MED ORDER — ASPIRIN 81 MG PO CHEW
81.0000 mg | CHEWABLE_TABLET | Freq: Every day | ORAL | Status: DC
  Administered 2016-08-15 – 2016-08-16 (×2): 81 mg via ORAL
  Filled 2016-08-14 (×2): qty 1

## 2016-08-14 MED ORDER — IPRATROPIUM-ALBUTEROL 0.5-2.5 (3) MG/3ML IN SOLN: 3.0000 mL | RESPIRATORY_TRACT | 0 refills | Status: DC | PRN

## 2016-08-14 MED ORDER — WARFARIN SODIUM 4 MG PO TABS
4.0000 mg | ORAL_TABLET | Freq: Once | ORAL | Status: AC
  Administered 2016-08-14: 4 mg via ORAL
  Filled 2016-08-14: qty 1

## 2016-08-14 MED ORDER — DOXYCYCLINE HYCLATE 100 MG PO TABS: 100.0000 mg | ORAL_TABLET | Freq: Two times a day (BID) | ORAL | 0 refills | Status: AC

## 2016-08-14 MED ORDER — CARVEDILOL 3.125 MG PO TABS: 3.1250 mg | ORAL_TABLET | Freq: Two times a day (BID) | ORAL | Status: AC

## 2016-08-14 MED ORDER — CLONAZEPAM 0.5 MG PO TABS: 0.2500 mg | ORAL_TABLET | Freq: Two times a day (BID) | ORAL | 0 refills | Status: DC | PRN

## 2016-08-14 MED ORDER — WARFARIN SODIUM 2.5 MG PO TABS: 2.5000 mg | ORAL_TABLET | Freq: Every day | ORAL | 0 refills | Status: DC

## 2016-08-14 NOTE — Progress Notes (Signed)
Patient daughter informed LCSWA family chose [facility]. Facility [facility] aware. Patient will transport by [facility]. Facility wants patient to come around 4:00pm today. Family informed they will be called to complete admission paper work .

## 2016-08-14 NOTE — Evaluation (Signed)
Occupational Therapy Evaluation Patient Details Name: Cindy [name] MRN: [id number] DOB: Oct 19, 1941 Today's Date: 08/14/2016    History of Present Illness 75 y.o. female admitted with sepsis, PNA. PMH of CVA, afib, HTN, asthma, GERD.    Clinical Impression   Pt presents to OT with the above.  She demonstrates the below listed deficits.  She currently requires max A for ADLs and has significant cognitive impairment.  Recommend SNF level rehab at discharge and all further OT needs can be met at [facility].  Acute OT will sign off at this time.       Follow Up Recommendations  SNF    Equipment Recommendations  None recommended by OT    Recommendations for Other Services       Precautions / Restrictions Precautions Precautions: Fall      Mobility Bed Mobility                  Transfers Overall transfer level: Needs assistance Equipment used: Rolling walker (2 wheeled) Transfers: Sit to/from [facility] Sit to Stand: Mod assist Stand pivot transfers: Mod assist            Balance Overall balance assessment: Needs assistance Sitting-balance support: Feet supported Sitting balance-Leahy Scale: Fair     Standing balance support: Bilateral upper extremity supported Standing balance-Leahy Scale: Poor                              ADL Overall ADL's : Needs assistance/impaired Eating/Feeding: Set up;Minimal assistance;Sitting   Grooming: Wash/dry hands;Wash/dry face;Oral care;Brushing hair;Moderate assistance;Sitting   Upper Body Bathing: Moderate assistance;Sitting   Lower Body Bathing: Maximal assistance;Sit to/from stand   Upper Body Dressing : Total assistance;Sitting   Lower Body Dressing: Total assistance;Sit to/from stand Lower Body Dressing Details (indicate cue type and reason): Pt handed sock and instructed to don her sock.  She was unable to initiate the task despite max cuing  Toilet Transfer: Moderate  assistance;Stand-pivot;BSC;RW   Toileting- Clothing Manipulation and Hygiene: Total assistance;Sit to/from stand       Functional mobility during ADLs: Moderate assistance;Rolling walker General ADL Comments: Pt with significant difficulty sequencing ADL tasks      Vision     Perception     Praxis      Pertinent Vitals/Pain Pain Assessment: Faces Faces Pain Scale: Hurts little more Pain Location: chest  Pain Descriptors / Indicators: Grimacing Pain Intervention(s): Repositioned     Hand Dominance Right   Extremity/Trunk Assessment Upper Extremity Assessment Upper Extremity Assessment: Generalized weakness   Lower Extremity Assessment Lower Extremity Assessment: Defer to PT evaluation       Communication Communication Communication: Other (comment);Expressive difficulties (Pt with perseverative speech pattern )   Cognition Arousal/Alertness: Awake/alert Behavior During Therapy: WFL for tasks assessed/performed Overall Cognitive Status: No family/caregiver present to determine baseline cognitive functioning (h/o dementia )                     General Comments       Exercises       Shoulder Instructions      Home Living Family/patient expects to be discharged to:: Skilled nursing facility                                 Additional Comments: Pt from ALF, and unable to provide info re: PLOF  Prior Functioning/Environment Level of Independence: Needs assistance        Comments: Pt unable to provide this info         OT Problem List: Decreased strength;Decreased activity tolerance;Impaired balance (sitting and/or standing);Decreased cognition;Decreased safety awareness;Decreased knowledge of use of DME or AE;Pain   OT Treatment/Interventions:      OT Goals(Current goals can be found in the care plan section) Acute Rehab OT Goals OT Goal Formulation: Patient unable to participate in goal setting  OT Frequency:      Barriers to D/C:            Co-evaluation              End of Session Equipment Utilized During Treatment: Rolling walker Nurse Communication: Mobility status  Activity Tolerance: Patient tolerated treatment well Patient left: in chair;with call bell/phone within reach;with chair alarm set   Time: 1009-1039 OT Time Calculation (min): 30 min Charges:  OT General Charges $OT Visit: 1 Procedure OT Evaluation $OT Eval Moderate Complexity: 1 Procedure OT Treatments $Self Care/Home Management : 8-22 mins G-Codes:    Kellis Topete M 09/08/2016, 1:50 PM

## 2016-08-14 NOTE — Progress Notes (Signed)
PASRR number not received, unable to d/c pt to SNF. Family aware. CSW following. MD aware. Pt IV removed. Kizzie Ide, RN

## 2016-08-14 NOTE — Discharge Instructions (Signed)
Fall Prevention in Hospitals, Adult As a hospital patient, your condition and the treatments you receive can increase your risk for falls. Some additional risk factors for falls in a hospital include:  Being in an unfamiliar environment.  Being on bed rest.  Your surgery.  Taking certain medicines.  Your tubing requirements, such as intravenous (IV) therapy or catheters. It is important that you learn how to decrease fall risks while at the hospital. Below are important tips that can help prevent falls. SAFETY TIPS FOR PREVENTING FALLS Talk about your risk of falling.  Ask your health care provider why you are at risk for falling. Is it your medicine, illness, tubing placement, or something else?  Make a plan with your health care provider to keep you safe from falls.  Ask your health care provider or pharmacist about side effects of your medicines. Some medicines can make you dizzy or affect your coordination. Ask for help.  Ask for help before getting out of bed. You may need to press your call button.  Ask for assistance in getting safely to the toilet.  Ask for a walker or cane to be put at your bedside. Ask that most of the side rails on your bed be placed up before your health care provider leaves the room.  Ask family or friends to sit with you.  Ask for things that are out of your reach, such as your glasses, hearing aids, telephone, bedside table, or call button. Follow these tips to avoid falling:  Stay lying or seated, rather than standing, while waiting for help.  Wear rubber-soled slippers or shoes whenever you walk in the hospital.  Avoid quick, sudden movements.  Change positions slowly.  Sit on the side of your bed before standing.  Stand up slowly and wait before you start to walk.  Let your health care provider know if there is a spill on the floor.  Pay careful attention to the medical equipment, electrical cords, and tubes around you.  When you  need help, use your call button by your bed or in the bathroom. Wait for one of your health care providers to help you.  If you feel dizzy or unsure of your footing, return to bed and wait for assistance.  Avoid being distracted by the TV, telephone, or another person in your room.  Do not lean or support yourself on rolling objects, such as IV poles or bedside tables.   This information is not intended to replace advice given to you by your health care provider. Make sure you discuss any questions you have with your health care provider.   Document Released: 11/12/2000 Document Revised: 12/06/2014 Document Reviewed: 07/23/2012 Elsevier Interactive Patient Education 2016 [place] in the Home  Falls can cause injuries. They can happen to people of all ages. There are many things you can do to make your home safe and to help prevent falls.  WHAT CAN I DO ON THE OUTSIDE OF MY HOME?  Regularly fix the edges of walkways and driveways and fix any cracks.  Remove anything that might make you trip as you walk through a door, such as a raised step or threshold.  Trim any bushes or trees on the path to your home.  Use bright outdoor lighting.  Clear any walking paths of anything that might make someone trip, such as rocks or tools.  Regularly check to see if handrails are loose or broken. Make sure that both sides of  any steps have handrails.  Any raised decks and porches should have guardrails on the edges.  Have any leaves, snow, or ice cleared regularly.  Use sand or salt on walking paths during winter.  Clean up any spills in your garage right away. This includes oil or grease spills. WHAT CAN I DO IN THE BATHROOM?   Use night lights.  Install grab bars by the toilet and in the tub and shower. Do not use towel bars as grab bars.  Use non-skid mats or decals in the tub or shower.  If you need to sit down in the shower, use a plastic, non-slip  stool.  Keep the floor dry. Clean up any water that spills on the floor as soon as it happens.  Remove soap buildup in the tub or shower regularly.  Attach bath mats securely with double-sided non-slip rug tape.  Do not have throw rugs and other things on the floor that can make you trip. WHAT CAN I DO IN THE BEDROOM?  Use night lights.  Make sure that you have a light by your bed that is easy to reach.  Do not use any sheets or blankets that are too big for your bed. They should not hang down onto the floor.  Have a firm chair that has side arms. You can use this for support while you get dressed.  Do not have throw rugs and other things on the floor that can make you trip. WHAT CAN I DO IN THE KITCHEN?  Clean up any spills right away.  Avoid walking on wet floors.  Keep items that you use a lot in easy-to-reach places.  If you need to reach something above you, use a strong step stool that has a grab bar.  Keep electrical cords out of the way.  Do not use floor polish or wax that makes floors slippery. If you must use wax, use non-skid floor wax.  Do not have throw rugs and other things on the floor that can make you trip. WHAT CAN I DO WITH MY STAIRS?  Do not leave any items on the stairs.  Make sure that there are handrails on both sides of the stairs and use them. Fix handrails that are broken or loose. Make sure that handrails are as long as the stairways.  Check any carpeting to make sure that it is firmly attached to the stairs. Fix any carpet that is loose or worn.  Avoid having throw rugs at the top or bottom of the stairs. If you do have throw rugs, attach them to the floor with carpet tape.  Make sure that you have a light switch at the top of the stairs and the bottom of the stairs. If you do not have them, ask someone to add them for you. WHAT ELSE CAN I DO TO HELP PREVENT FALLS?  Wear shoes that:  Do not have high heels.  Have rubber bottoms.  Are  comfortable and fit you well.  Are closed at the toe. Do not wear sandals.  If you use a stepladder:  Make sure that it is fully opened. Do not climb a closed stepladder.  Make sure that both sides of the stepladder are locked into place.  Ask someone to hold it for you, if possible.  Clearly mark and make sure that you can see:  Any grab bars or handrails.  First and last steps.  Where the edge of each step is.  Use tools that help  you move around (mobility aids) if they are needed. These include:  Canes.  Walkers.  Scooters.  Crutches.  Turn on the lights when you go into a dark area. Replace any light bulbs as soon as they burn out.  Set up your furniture so you have a clear path. Avoid moving your furniture around.  If any of your floors are uneven, fix them.  If there are any pets around you, be aware of where they are.  Review your medicines with your doctor. Some medicines can make you feel dizzy. This can increase your chance of falling. Ask your doctor what other things that you can do to help prevent falls.   This information is not intended to replace advice given to you by your health care provider. Make sure you discuss any questions you have with your health care provider.   Document Released: 09/11/2009 Document Revised: 04/01/2015 Document Reviewed: 12/20/2014 Elsevier Interactive Patient Education 2016 [place] Prevention While you may not be able to control the fact that you have asthma, you can take actions to prevent asthma attacks. The best way to prevent asthma attacks is to maintain good control of your asthma. You can achieve this by:  Taking your medicines as directed.  Avoiding things that can irritate your airways or make your asthma symptoms worse (asthma triggers).  Keeping track of how well your asthma is controlled and of any changes in your symptoms.  Responding quickly to worsening asthma symptoms (asthma  attack).  Seeking emergency care when it is needed. WHAT ARE SOME WAYS TO PREVENT AN ASTHMA ATTACK? Have a Plan Work with your health care provider to create a written plan for managing and treating your asthma attacks (asthma action plan). This plan includes:  A list of your asthma triggers and how you can avoid them.  Information on when medicines should be taken and when their dosages should be changed.  The use of a device that measures how well your lungs are working (peak flow meter). Monitor Your Asthma Use your peak flow meter and record your results in a journal every day. A drop in your peak flow numbers on one or more days may indicate the start of an asthma attack. This can happen even before you start to feel symptoms. You can prevent an asthma attack from getting worse by following the steps in your asthma action plan. Avoid Asthma Triggers Work with your asthma health care provider to find out what your asthma triggers are. This can be done by:  Allergy testing.  Keeping a journal that notes when asthma attacks occur and the factors that may have contributed to them.  Determining if there are other medical conditions that are making your asthma worse. Once you have determined your asthma triggers, take steps to avoid them. This may include avoiding excessive or prolonged exposure to:  Dust. Have someone dust and vacuum your home for you once or twice a week. Using a high-efficiency particulate arrestance (HEPA) vacuum is best.  Smoke. This includes campfire smoke, forest fire smoke, and secondhand smoke from tobacco products.  Pet dander. Avoid contact with animals that you know you are allergic to.  Allergens from trees, grasses or pollens. Avoid spending a lot of time outdoors when pollen counts are high, and on very windy days.  Very cold, dry, or humid air.  Mold.  Foods that contain high amounts of sulfites.  Strong odors.  Outdoor air pollutants, such as  Engineer, mining  exhaust.  Indoor air pollutants, such as aerosol sprays and fumes from household cleaners.  Household pests, including dust mites and cockroaches, and pest droppings.  Certain medicines, including NSAIDs. Always talk to your health care provider before stopping or starting any new medicines. Medicines Take over-the-counter and prescription medicines only as told by your health care provider. Many asthma attacks can be prevented by carefully following your medicine schedule. Taking your medicines correctly is especially important when you cannot avoid certain asthma triggers. Act Quickly If an asthma attack does happen, acting quickly can decrease how severe it is and how long it lasts. Take these steps:   Pay attention to your symptoms. If you are coughing, wheezing, or having difficulty breathing, do not wait to see if your symptoms go away on their own. Follow your asthma action plan.  If you have followed your asthma action plan and your symptoms are not improving, call your health care provider or seek immediate medical care at the nearest hospital. It is important to note how often you need to use your fast-acting rescue inhaler. If you are using your rescue inhaler more often, it may mean that your asthma is not under control. Adjusting your asthma treatment plan may help you to prevent future asthma attacks and help you to gain better control of your condition. HOW CAN I PREVENT AN ASTHMA ATTACK WHEN I EXERCISE? Follow advice from your health care provider about whether you should use your fast-acting inhaler before exercising. Many people with asthma experience exercise-induced bronchoconstriction (EIB). This condition often worsens during vigorous exercise in cold, humid, or dry environments. Usually, people with EIB can stay very active by pre-treating with a fast-acting inhaler before exercising.   This information is not intended to replace advice given to you by your health  care provider. Make sure you discuss any questions you have with your health care provider.   Document Released: 11/03/2009 Document Revised: 08/06/2015 Document Reviewed: 04/17/2015 Elsevier Interactive Patient Education [employer].

## 2016-08-14 NOTE — Clinical Social Work Placement (Signed)
   CLINICAL SOCIAL WORK PLACEMENT  NOTE  Date:  08/14/2016  Patient Details  Name: Cindy [name] MRN: [id number] Date of Birth: June 12, 1941  Clinical Social Work is seeking post-discharge placement for this patient at the [facility] level of care (*CSW will initial, date and re-position this form in  chart as items are completed):  Yes   Patient/family provided with [facility]'s list of facilities offering this level of care within the geographic area requested by the patient (or if unable, by the patient's family).  Yes   Patient/family informed of their freedom to choose among providers that offer the needed level of care, that participate in Medicare, Medicaid or managed care program needed by the patient, have an available bed and are willing to accept the patient.  Yes   Patient/family informed of [facility]'s ownership interest in [facility] and [facility], as well as of the fact that they are under no obligation to receive care at these facilities.  PASRR submitted to [facility] on 08/14/16     PASRR number received on       Existing PASRR number confirmed on       FL2 transmitted to all facilities in geographic area requested by pt/family on       FL2 transmitted to all facilities within larger geographic area on 08/12/16     Patient informed that his/her managed care company has contracts with or will negotiate with certain facilities, including the following:  [facility]     Yes   Patient/family informed of bed offers received.  Patient chooses bed at [facility]     Physician recommends and patient chooses bed at      Patient to be transferred to [facility] on 08/14/16.  Patient to be transferred to facility by [facility]     Patient family notified on 08/14/16 of transfer.  Name of family member notified:  [name]     PHYSICIAN       Additional Comment:     _______________________________________________ Lia Hopping, [place] 08/14/2016, 1:11 PM

## 2016-08-14 NOTE — Discharge Summary (Addendum)
Physician Discharge Summary  Cindy [name] [id number] DOB: July 05, 1941 DOA: 08/09/2016  PCP: [name], MD  Admit date: 08/09/2016 Discharge date: 08/16/2016  Admitted From: ALF Disposition:  SNF   Recommendations for SNF Care:   1. Please check daily PT/INR and adjust warfarin doses as needed to keep INR therapeutic  Discharge Condition: STABLE CODE STATUS: FULL  Diet:  Dysphagia 3 (Mech soft) solids;Thin liquid   Liquid Administration via Cup;Straw  Medication Administration Whole meds with puree  Compensations Slow rate;Small sips/bites;Clear throat intermittently  Postural Changes Remain semi-upright after after feeds/meals (Comment);Seated upright at 90 degrees   Brief/Interim Summary:  Sepsis secondary to HCAP:  - Follow-upcultures- blood/sputum: NGTD - Empiric antibiotics of vancomycin and cefepime, de-escalated 9/14 - complete 5 more days of doxycycline  Asthma exacerbation: acute. Clinically improving - resume home nebs and respiratory medications - Prednisone being tapered - Mucinex ordered.  - oxygen weaned down to room air  Urinary retention -I/o x 1 -periodic bladder scan -seems to be voiding much better now  Atrial fibrillation with supratherapeutic [id number] INR greater than 10. Chadsvasc score = 6 - s/p vitamin K 5 mg oral-- no signs of bleeding--daily INR, INR now therapeutic, appreciate pharmacy assistance - holding Coumadin per pharmacy, restarting per pharmacy as INR is trending down to therapeutic 9/14 Pt having RVR and not on any rate limiting medications, started low dose carvedilol to control rate and has improved Warfarin being discharged at 2.5 mg daily.  Recommend daily PT/INR testing and adjusting warfarin doses as needed.   Elevated troponins: Acute. Troponin 0.08 on admission  -suspect demand ischemia, no chest pain - Trend cardiac troponin remained stable  Coronary artery disease - Continue aspirin and isosorbide  mononitrate  Essential hypertension - Continue losartan   Anxiety/depression - Continue Klonopin prn - d/c'd zoloft due to Qtc  Hyperlipidemia  - Continue simvastatin   Hypokalemia - repleted - Continue to monitor and replace as needed  Hyponatremia: Sodium 132 on admission suspect symptoms could be secondary to nausea and vomiting. - improved with Gentle IV fluid hydration  Mild Acute CHF - improved now -Echo reveals mild systolic dysfunction and EF 40-45%.  Added coreg, already on ARB (losartan)  History CVA PT Eval pending to help determine disposition ALF vs SNF  GERD  - Pharmacy substitution of Protonix   Evaluated for dysphagia - Dysphagia 3 diet recommended by SLP specialist  DVT prophylaxis:  Fully anticoagulated  Code Status: Full Code  Family Communication: Daughter was at bedside 9/14  Disposition Plan:  DC to SNF  Discharge Diagnoses:  Principal Problem:   Sepsis ([place]) Active Problems:   Asthma   A-fib ([place])   GERD (gastroesophageal reflux disease)   Anxiety   Hypertension   HCAP (healthcare-associated pneumonia)  Discharge Instructions  Discharge Instructions    Diet - low sodium heart healthy    Complete by:  As directed        Medication List    STOP taking these medications   sertraline 100 MG tablet Commonly known as:  ZOLOFT   triamcinolone cream 0.1 % Commonly known as:  KENALOG     TAKE these medications   acetaminophen 325 MG tablet Commonly known as:  TYLENOL Take 2 tablets (650 mg total) by mouth every 6 (six) hours as needed for mild pain (or Fever >/= 101). What changed:  when to take this  reasons to take this  additional instructions  Another medication with the same name was removed. Continue taking  this medication, and follow the directions you see here.   albuterol 108 (90 Base) MCG/ACT inhaler Commonly known as:  PROVENTIL HFA;VENTOLIN HFA Inhale 2 puffs into the lungs every 6 (six)  hours as needed for shortness of breath.   alendronate 70 MG tablet Commonly known as:  FOSAMAX Take 70 mg by mouth every Monday. Take with a full glass of water on an empty stomach.   aspirin EC 81 MG tablet Take 81 mg by mouth daily.   baclofen 10 MG tablet Commonly known as:  LIORESAL Take 10 mg by mouth 2 (two) times daily as needed for muscle spasms.   BAZA EX Apply 1 application topically 2 (two) times daily as needed (affected areas on buttocks/sacral.).   budesonide-formoterol 160-4.5 MCG/ACT inhaler Commonly known as:  SYMBICORT Inhale 2 puffs into the lungs 2 (two) times daily.   calcium citrate-vitamin D 200-200 MG-UNIT Tabs Take 1 tablet by mouth daily.   carvedilol 3.125 MG tablet Commonly known as:  COREG Take 1 tablet (3.125 mg total) by mouth 2 (two) times daily with a meal.   cholecalciferol 1000 units tablet Commonly known as:  VITAMIN D Take 1,000 Units by mouth daily.   clonazePAM 0.5 MG tablet Commonly known as:  KLONOPIN Take 0.5 tablets (0.25 mg total) by mouth 2 (two) times daily as needed for anxiety. What changed:  when to take this  reasons to take this   cyanocobalamin 500 MCG tablet Take 500 mcg by mouth daily.   doxycycline 100 MG tablet Commonly known as:  VIBRA-TABS Take 1 tablet (100 mg total) by mouth every 12 (twelve) hours.   ferrous sulfate 325 (65 FE) MG tablet Take 325 mg by mouth daily with breakfast.   fluticasone 50 MCG/ACT nasal spray Commonly known as:  FLONASE Place 2 sprays into both nostrils daily.   ipratropium-albuterol 0.5-2.5 (3) MG/3ML Soln Commonly known as:  DUONEB Take 3 mLs by nebulization every 4 (four) hours as needed (wheezing, coughing, SOB).   isosorbide mononitrate 30 MG 24 hr tablet Commonly known as:  IMDUR Take 30 mg by mouth daily.   loratadine 10 MG tablet Commonly known as:  CLARITIN Take 10 mg by mouth daily.   losartan 50 MG tablet Commonly known as:  COZAAR Take 50 mg by mouth  every evening.   Melatonin 3 MG Caps Take 3 mg by mouth at bedtime.   Mineral Oil Heavy Oil Place 1 drop into the right ear every Monday.   nitroGLYCERIN 0.4 MG SL tablet Commonly known as:  NITROSTAT Place 0.4 mg under the tongue every 5 (five) minutes as needed for chest pain (CALL 9-1-1 IF NO RELIEF AFTER THREE DOSES). For chest pain   omeprazole 20 MG capsule Commonly known as:  PRILOSEC Take 40 mg by mouth daily.   polyethylene glycol packet Commonly known as:  MIRALAX / GLYCOLAX Take 17 g by mouth daily as needed for mild constipation.   predniSONE 20 MG tablet Commonly known as:  DELTASONE Take 1 tablet (20 mg total) by mouth daily with breakfast.   QC SORE THROAT SPRAY 1.4 % Liqd Generic drug:  phenol Use as directed 5 sprays in the mouth or throat every 2 (two) hours as needed for throat irritation / pain.   ROBAFEN 100 MG/5ML syrup Generic drug:  guaifenesin Take 200 mg by mouth every 6 (six) hours as needed for cough.   simvastatin 10 MG tablet Commonly known as:  ZOCOR Take 10 mg by mouth at bedtime.  sodium chloride 0.65 % Soln nasal spray Commonly known as:  OCEAN Place 2 sprays into both nostrils every 4 (four) hours. Scheduled while awake only & as needed following nose bleed.   tiotropium 18 MCG inhalation capsule Commonly known as:  SPIRIVA Place 18 mcg into inhaler and inhale daily.   traZODone 150 MG tablet Commonly known as:  DESYREL Take 150 mg by mouth at bedtime.   warfarin 2.5 MG tablet Commonly known as:  COUMADIN Take 1 tablet (2.5 mg total) by mouth daily at 6 PM. What changed:  medication strength  how much to take  when to take this  Another medication with the same name was removed. Continue taking this medication, and follow the directions you see here.      Contact information for after-discharge care    Destination    [facility] .   Specialty:  Skilled Nursing Facility Contact  information: [address] [place] [phone]             No Known Allergies  Procedures/Studies: Dg Chest 2 View  Result Date: 08/09/2016 CLINICAL DATA:  Cough, shortness of breath and fever. EXAM: CHEST  2 VIEW COMPARISON:  04/26/2016. FINDINGS: Stable enlarged cardiac silhouette. Interval patchy opacity in the left lower lobe. Diffuse peribronchial thickening and accentuation of the interstitial markings with progression. Interval minimal patchy opacity at the right lung base. Small left pleural effusion. IMPRESSION: 1. Left lower lobe pneumonia with a small left pleural effusion. 2. Minimal patchy atelectasis or pneumonia at the right lung base. 3. Moderate diffuse bronchitic changes. 4. Stable cardiomegaly. Electronically Signed   By: Claudie Revering M.D.   On: 08/09/2016 20:46   Dg Swallowing Func-speech Pathology  Result Date: 08/13/2016 Objective Swallowing Evaluation: Type of Study: Bedside Swallow Evaluation Patient Details Name: Cindy [name] MRN: [id number] Date of Birth: Aug 05, 1941 Today's Date: 08/13/2016 Time: SLP Start Time (ACUTE ONLY): 1226-SLP Stop Time (ACUTE ONLY): 1245 SLP Time Calculation (min) (ACUTE ONLY): 19 min Past Medical History: Past Medical History: Diagnosis Date . Angina pectoris ([place])  . Anxiety  . Asthma  . Barrett's esophagus  . Bronchitis  . 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 Chest pain  . Coronary artery disease   right carotid occlusion  . Depression  . Dysrhythmia   atrial fibrillation . GERD (gastroesophageal reflux disease)  . Hyperlipidemia  . Hypertension  . Recurrent upper respiratory infection (URI)  . Stroke [facility])   2007, after left endarterectomy Past Surgical History: Past Surgical History: Procedure Laterality Date . ABDOMINAL HYSTERECTOMY   . BALLOON DILATION  10/18/2012  Procedure: BALLOON DILATION;  Surgeon: Lear Ng, MD;  Location: [facility];  Service: Endoscopy;  Laterality: N/A; . CARDIAC CATHETERIZATION   .  CAROTID ENDARTERECTOMY    left, complicated by stroke . CHOLECYSTECTOMY    incidental at time of colectomy . COLONOSCOPY  12/01/2011  Procedure: COLONOSCOPY;  Surgeon: Lear Ng, MD;  Location: [facility];  Service: Endoscopy;  Laterality: N/A; . COLONOSCOPY  10/18/2012  Procedure: COLONOSCOPY;  Surgeon: Lear Ng, MD;  Location: [facility];  Service: Endoscopy;  Laterality: N/A;  colonic dilation . ESOPHAGOGASTRODUODENOSCOPY  12/01/2011  Procedure: ESOPHAGOGASTRODUODENOSCOPY (EGD);  Surgeon: Lear Ng, MD;  Location: [facility];  Service: Endoscopy;  Laterality: N/A; . HOT HEMOSTASIS  12/01/2011  Procedure: HOT HEMOSTASIS (ARGON PLASMA COAGULATION/BICAP);  Surgeon: Lear Ng, MD;  Location: [facility];  Service: Endoscopy;  Laterality: N/A; . 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  PMH + for cva, Barrett's esophagus, HTN, anxiety, depression.  PSH includes CEA.  Pt also with lordosis and spurring at C5-C6, C6-C7.  She has been having difficulty swallowing and speech/swallow eval ordered.  From chart review, RN report of deficits and suspected pna -  MBS indicated.   Subjective: pt awake in chair Visit Diagnosis: No diagnosis found. Assessment / Plan / Recommendation CHL IP CLINICAL IMPRESSIONS 08/13/2016 Therapy Diagnosis Mild oral phase dysphagia;Mild pharyngeal phase dysphagia Clinical Impression Mild oropharyngeal dysphagia without aspiration or deep penetration of any consistency tested. Decreased oral coordination resulted in premature spillage of barium into pharynx = poorly controlled. Pharyngeal swallow was delayed to pyriform sinus with thin liquid via cup allowing trace upper laryngeal penetration.  Cued cough cleared TRACE penetrates.  Pharyngeal swallow was strong without residuals pt complained of sensation of residuals at distal esophagus, could not view during  testing due to pt's stature.  Suspect given pt's h/o Barrett's and reflux, her primary aspiration risk if from known esophageal deficits.  Recommend dys3/thin due to pt's oral deficits/poor dentition.  Of note, pt was fidgety during entire test.   Impact on safety and function Mild aspiration risk   CHL IP TREATMENT RECOMMENDATION 08/13/2016 Treatment Recommendations Therapy as outlined in treatment plan below   Prognosis 08/13/2016 Prognosis for Safe Diet Advancement Fair Barriers to Reach Goals Cognitive deficits Barriers/Prognosis Comment -- CHL IP DIET RECOMMENDATION 08/13/2016 SLP Diet Recommendations Dysphagia 3 (Mech soft) solids;Thin liquid Liquid Administration via Cup;Straw Medication Administration Whole meds with puree Compensations Slow rate;Small sips/bites;Clear throat intermittently Postural Changes Remain semi-upright after after feeds/meals (Comment);Seated upright at 90 degrees   CHL IP OTHER RECOMMENDATIONS 08/13/2016 Recommended Consults Consider esophageal assessment Oral Care Recommendations Oral care BID Other Recommendations --   No flowsheet data found.  CHL IP FREQUENCY AND DURATION 08/13/2016 Speech Therapy Frequency (ACUTE ONLY) min 1 x/week Treatment Duration 1 week      CHL IP ORAL PHASE 08/13/2016 Oral Phase Impaired Oral - Pudding Teaspoon -- Oral - Pudding Cup -- Oral - Honey Teaspoon -- Oral - Honey Cup -- Oral - Nectar Teaspoon -- Oral - Nectar Cup Weak lingual manipulation;Premature spillage Oral - Nectar Straw Weak lingual manipulation;Premature spillage Oral - Thin Teaspoon Weak lingual manipulation;Premature spillage Oral - Thin Cup Weak lingual manipulation;Premature spillage Oral - Thin Straw -- Oral - Puree Weak lingual manipulation;Premature spillage Oral - Mech Soft -- Oral - Regular Weak lingual manipulation;Premature spillage Oral - Multi-Consistency -- Oral - Pill -- Oral Phase - Comment --  CHL IP PHARYNGEAL PHASE 08/13/2016 Pharyngeal Phase Impaired Pharyngeal- Pudding  Teaspoon -- Pharyngeal -- Pharyngeal- Pudding Cup -- Pharyngeal -- Pharyngeal- Honey Teaspoon -- Pharyngeal -- Pharyngeal- Honey Cup -- Pharyngeal -- Pharyngeal- Nectar Teaspoon Delayed swallow initiation-vallecula Pharyngeal -- Pharyngeal- Nectar Cup Delayed swallow initiation-vallecula Pharyngeal -- Pharyngeal- Nectar Straw Delayed swallow initiation-vallecula Pharyngeal -- Pharyngeal- Thin Teaspoon Delayed swallow initiation-vallecula;Delayed swallow initiation-pyriform sinuses Pharyngeal -- Pharyngeal- Thin Cup Delayed swallow initiation-vallecula;Delayed swallow initiation-pyriform sinuses Trace penetration of thin, cleared with cued throat clear Pharyngeal -- Pharyngeal- Thin Straw -- Pharyngeal -- Pharyngeal- Puree Delayed swallow initiation-vallecula Pharyngeal -- Pharyngeal- Mechanical Soft -- Pharyngeal -- Pharyngeal- Regular -- Pharyngeal -- Pharyngeal- Multi-consistency -- Pharyngeal -- Pharyngeal- Pill -- Pharyngeal -- Pharyngeal Comment --  CHL IP CERVICAL ESOPHAGEAL PHASE 08/13/2016 Cervical Esophageal Phase WFL Pudding Teaspoon -- Pudding Cup -- Honey Teaspoon -- Honey  Cup -- Surveyor, quantity Cup -- [employer] -- Thin Teaspoon -- Thin Cup -- Thin Straw -- Puree -- Mechanical Soft -- Regular -- Multi-consistency -- Pill -- Cervical Esophageal Comment -- No flowsheet data found. Luanna Salk, MS [facility] SLP [phone]               Subjective: Pt sitting up in chair in no distress, cooperative and pleasant  Discharge Exam: Vitals:   08/15/16 2127 08/16/16 0507  BP: (!) 156/82 (!) 141/69  Pulse: 77 94  Resp: 16 18  Temp: 99.1 F (37.3 C) 98.2 F (36.8 C)   Vitals:   08/16/16 0507 08/16/16 0849 08/16/16 0854 08/16/16 0859  BP: (!) 141/69     Pulse: 94     Resp: 18     Temp: 98.2 F (36.8 C)     TempSrc: Oral     SpO2: 97% 95% 95% 95%  Weight: 68.3 kg (150 lb 9.2 oz)     Height:        General exam: improved appearing, more alert and interactive, NAD.  Respiratory  system: no wheezing heard, good air movement, occasional dry cough. Cardiovascular system: irregularly irregular, normal s1, s2 Gastrointestinal system:  No organomegaly or masses felt. Normal bowel sounds heard. Central nervous system: Awake and alert.  The results of significant diagnostics from this hospitalization (including imaging, microbiology, ancillary and laboratory) are listed below for reference.     Microbiology: Recent Results (from the past 240 hour(s))  Blood Culture (routine x 2)     Status: None   Collection Time: 08/09/16  8:29 PM  Result Value Ref Range Status   Specimen Description BLOOD RIGHT ANTECUBITAL  Final   Special Requests BOTTLES DRAWN AEROBIC AND ANAEROBIC 5CC EACH  Final   Culture   Final    NO GROWTH 5 DAYS Performed at [facility]    Report Status 08/14/2016 FINAL  Final  Blood Culture (routine x 2)     Status: None   Collection Time: 08/09/16  8:29 PM  Result Value Ref Range Status   Specimen Description BLOOD RIGHT HAND  Final   Special Requests IN PEDIATRIC BOTTLE 2.5CC  Final   Culture   Final    NO GROWTH 5 DAYS Performed at [facility]    Report Status 08/14/2016 FINAL  Final  Urine culture     Status: None   Collection Time: 08/10/16  9:47 AM  Result Value Ref Range Status   Specimen Description URINE, CATHETERIZED  Final   Special Requests NONE  Final   Culture NO GROWTH Performed at [facility]   Final   Report Status 08/11/2016 FINAL  Final     Labs: BNP (last 3 results)  Recent Labs  08/09/16 2029  BNP [id number]*   Basic Metabolic Panel:  Recent Labs Lab 08/09/16 2029 08/10/16 0613 08/12/16 0527  NA 132* 132* 134*  K 3.3* 4.0 4.7  CL 98* 100* 101  CO2 24 21* 26  GLUCOSE 96 150* 123*  BUN 23* 23* 29*  CREATININE 0.96 0.83 0.88  CALCIUM 10.1 9.9 10.0   Liver Function Tests:  Recent Labs Lab 08/09/16 2029 08/12/16 0527  AST 100* 110*  ALT 80* 127*  ALKPHOS 93 88  BILITOT 1.2 1.0   PROT 6.6 5.9*  ALBUMIN 3.1* 2.7*   No results for input(s): LIPASE, AMYLASE in the last 168 hours. No results for input(s): AMMONIA in the last 168 hours. CBC:  Recent  Labs Lab 08/09/16 2029 08/10/16 0613 08/12/16 0527 08/15/16 1053  WBC 15.8* 17.5* 23.0* 16.9*  NEUTROABS 14.1*  --  20.5*  --   HGB 10.6* 10.9* 9.5* 9.9*  HCT 32.3* 33.0* 29.1* 30.7*  MCV 81.2 81.5 82.0 83.0  PLT 270 262 337 397   Cardiac Enzymes:  Recent Labs Lab 08/09/16 2029 08/10/16 0111 08/10/16 0613 08/10/16 1153  TROPONINI 0.08* 0.07* 0.08* 0.08*   BNP: Invalid input(s): POCBNP CBG: No results for input(s): GLUCAP in the last 168 hours. D-Dimer No results for input(s): DDIMER in the last 72 hours. Hgb A1c No results for input(s): HGBA1C in the last 72 hours. Lipid Profile No results for input(s): CHOL, HDL, LDLCALC, TRIG, CHOLHDL, LDLDIRECT in the last 72 hours. Thyroid function studies No results for input(s): TSH, T4TOTAL, T3FREE, THYROIDAB in the last 72 hours.  Invalid input(s): FREET3 Anemia work up No results for input(s): VITAMINB12, FOLATE, FERRITIN, TIBC, IRON, RETICCTPCT in the last 72 hours. Urinalysis    Component Value Date/Time   COLORURINE AMBER (A) 08/10/2016 0947   APPEARANCEUR CLOUDY (A) 08/10/2016 0947   LABSPEC 1.024 08/10/2016 0947   PHURINE 6.0 08/10/2016 0947   GLUCOSEU NEGATIVE 08/10/2016 0947   HGBUR TRACE (A) 08/10/2016 0947   BILIRUBINUR SMALL (A) 08/10/2016 0947   KETONESUR NEGATIVE 08/10/2016 0947   PROTEINUR 30 (A) 08/10/2016 0947   UROBILINOGEN 0.2 01/02/2015 1313   NITRITE NEGATIVE 08/10/2016 0947   LEUKOCYTESUR NEGATIVE 08/10/2016 0947   Sepsis Labs Invalid input(s): PROCALCITONIN,  WBC,  LACTICIDVEN Microbiology Recent Results (from the past 240 hour(s))  Blood Culture (routine x 2)     Status: None   Collection Time: 08/09/16  8:29 PM  Result Value Ref Range Status   Specimen Description BLOOD RIGHT ANTECUBITAL  Final   Special Requests  BOTTLES DRAWN AEROBIC AND ANAEROBIC 5CC EACH  Final   Culture   Final    NO GROWTH 5 DAYS Performed at [facility]    Report Status 08/14/2016 FINAL  Final  Blood Culture (routine x 2)     Status: None   Collection Time: 08/09/16  8:29 PM  Result Value Ref Range Status   Specimen Description BLOOD RIGHT HAND  Final   Special Requests IN PEDIATRIC BOTTLE 2.5CC  Final   Culture   Final    NO GROWTH 5 DAYS Performed at [facility]    Report Status 08/14/2016 FINAL  Final  Urine culture     Status: None   Collection Time: 08/10/16  9:47 AM  Result Value Ref Range Status   Specimen Description URINE, CATHETERIZED  Final   Special Requests NONE  Final   Culture NO GROWTH Performed at [facility]   Final   Report Status 08/11/2016 FINAL  Final   Time coordinating discharge: 35 minutes  SIGNED:  Irwin Brakeman, MD  [facility] 08/16/2016, 9:14 AM Pager   If 7PM-7AM, please contact night-coverage [email or web address] Password [email or web address]

## 2016-08-14 NOTE — Progress Notes (Signed)
Cindy [name] for Warfarin Indication: atrial fibrillation  No Known Allergies  Patient Measurements: Height: 5\' 1"  (154.9 cm) Weight: 159 lb 6.3 oz (72.3 kg) IBW/kg (Calculated) : 47.8  Vital Signs: Temp: 98.5 F (36.9 C) (09/16 0621) Temp Source: Oral (09/16 0621) BP: 134/87 (09/16 0621) Pulse Rate: 92 (09/16 0621)  Labs:  Recent Labs  08/12/16 0527 08/13/16 0543 08/14/16 0548  HGB 9.5*  --   --   HCT 29.1*  --   --   PLT 337  --   --   LABPROT 34.5* 23.0* 18.1*  INR 3.32 2.00 1.48  CREATININE 0.88  --   --     Estimated Creatinine Clearance: 49.5 mL/min (by C-G formula based on SCr of 0.88 mg/dL).   Medications:  Scheduled:  . arformoterol  15 mcg Nebulization BID  . aspirin EC  81 mg Oral Daily  . budesonide (PULMICORT) nebulizer solution  0.5 mg Nebulization BID  . carvedilol  3.125 mg Oral BID WC  . cyanocobalamin  500 mcg Oral Daily  . doxycycline  100 mg Oral Q12H  . fluticasone  2 spray Each Nare Daily  . isosorbide mononitrate  30 mg Oral Daily  . levalbuterol  0.63 mg Nebulization TID  . loratadine  10 mg Oral Daily  . losartan  50 mg Oral QPM  . pantoprazole  40 mg Oral Daily  . predniSONE  20 mg Oral Q breakfast  . simvastatin  10 mg Oral QHS  . traZODone  150 mg Oral QHS  . Warfarin - Pharmacist Dosing Inpatient   Does not apply q1800   Assessment: NYAIRA Cindy [name] is a 75 y.o. female admitted on 08/09/2016 with pneumonia and known to pharmacy from Vancomycin & Cefepime dosing.  PMH also includes warfarin for hx of Afib, hx CVA.  Pharmacy is consulted to dose warfarin.   PTA Warfarin 4mg  daily except 6mg  on Sunday, last dose 9/11 17:00.  INR upon arrival in ED > 10.  Vitamin K 5mg  PO x 1 given @ 23:47  Today, 08/14/2016:  INR now subtherapeutic after holding x 3 days and 2 reduced doses.  CBC: Hgb decreased to 9.5, Plt remain WNL (9/14)  No bleeding reported or documented  Diet: heart  healthy  Drug-drug interactions: Doxycycline may increase INR.  Goal of Therapy:  INR 2-3  Plan:   Warfarin 4 mg PO today  Given high INR on admission and current doxycycline, would discharge on 2.5-3 mg daily with INR follow-up in 3-5 days  Daily PT/INR.  Monitor for signs and symptoms of bleeding.   Reuel Boom, PharmD, BCPS Pager: [phone] 08/14/2016, 8:14 AM

## 2016-08-14 NOTE — Progress Notes (Signed)
Waiting on PASRR number. Daughter [name] informed. RN informed.

## 2016-08-15 LAB — PROTIME-INR
INR: 1.72
Prothrombin Time: 20.4 seconds — ABNORMAL HIGH (ref 11.4–15.2)

## 2016-08-15 LAB — CBC
HEMATOCRIT: 30.7 % — AB (ref 36.0–46.0)
HEMOGLOBIN: 9.9 g/dL — AB (ref 12.0–15.0)
MCH: 26.8 pg (ref 26.0–34.0)
MCHC: 32.2 g/dL (ref 30.0–36.0)
MCV: 83 fL (ref 78.0–100.0)
Platelets: 397 10*3/uL (ref 150–400)
RBC: 3.7 MIL/uL — AB (ref 3.87–5.11)
RDW: 15.8 % — ABNORMAL HIGH (ref 11.5–15.5)
WBC: 16.9 10*3/uL — AB (ref 4.0–10.5)

## 2016-08-15 MED ORDER — WARFARIN SODIUM 2.5 MG PO TABS
2.5000 mg | ORAL_TABLET | Freq: Once | ORAL | Status: AC
  Administered 2016-08-15: 2.5 mg via ORAL
  Filled 2016-08-15: qty 1

## 2016-08-15 MED ORDER — LEVALBUTEROL HCL 0.63 MG/3ML IN NEBU
0.6300 mg | INHALATION_SOLUTION | Freq: Two times a day (BID) | RESPIRATORY_TRACT | Status: DC
  Administered 2016-08-16: 0.63 mg via RESPIRATORY_TRACT
  Filled 2016-08-15 (×2): qty 3

## 2016-08-15 NOTE — Progress Notes (Signed)
Cindy [name] for Warfarin Indication: atrial fibrillation  No Known Allergies  Patient Measurements: Height: 5\' 1"  (154.9 cm) Weight: 150 lb 9.2 oz (68.3 kg) IBW/kg (Calculated) : 47.8  Vital Signs: Temp: 97.6 F (36.4 C) (09/17 0658) Temp Source: Axillary (09/17 0658) BP: 146/74 (09/17 0658) Pulse Rate: 94 (09/17 0658)  Labs:  Recent Labs  08/13/16 0543 08/14/16 0548 08/15/16 1053  HGB  --   --  9.9*  HCT  --   --  30.7*  PLT  --   --  397  LABPROT 23.0* 18.1* 20.4*  INR 2.00 1.48 1.72    Estimated Creatinine Clearance: 47.3 mL/min (by C-G formula based on SCr of 0.88 mg/dL).   Medications:  Scheduled:  . arformoterol  15 mcg Nebulization BID  . aspirin  81 mg Oral Daily  . budesonide (PULMICORT) nebulizer solution  0.5 mg Nebulization BID  . carvedilol  3.125 mg Oral BID WC  . cyanocobalamin  500 mcg Oral Daily  . doxycycline  100 mg Oral Q12H  . fluticasone  2 spray Each Nare Daily  . isosorbide mononitrate  30 mg Oral Daily  . levalbuterol  0.63 mg Nebulization BID  . loratadine  10 mg Oral Daily  . losartan  50 mg Oral QPM  . pantoprazole  40 mg Oral Daily  . predniSONE  20 mg Oral Q breakfast  . simvastatin  10 mg Oral QHS  . traZODone  150 mg Oral QHS  . Warfarin - Pharmacist Dosing Inpatient   Does not apply q1800   Assessment: Cindy [name] is a 75 y.o. female admitted on 08/09/2016 with pneumonia and known to pharmacy from Vancomycin & Cefepime dosing.  PMH also includes warfarin for hx of Afib, hx CVA.  Pharmacy is consulted to dose warfarin.   PTA Warfarin 4mg  daily except 6mg  on Sunday, last dose 9/11 17:00.  INR upon arrival in ED > 10.  Vitamin K 5mg  PO x 1 given @ 23:47  Today, 08/15/2016:  INR remains subtherapeutic but trending up nicely  CBC: Hgb sl decreased but stable; Plt remain WNL  No bleeding reported or documented  Diet: heart healthy  Drug-drug interactions: Doxycycline may increase  INR.  Goal of Therapy:  INR 2-3  Plan:   Warfarin 2.5 mg PO today  Given high INR on admission and current doxycycline, would discharge on 2.5-3 mg daily with INR follow-up in 3-5 days  Daily PT/INR; CBC q72 hr while on warfarin  Monitor for signs and symptoms of bleeding.   Reuel Boom, PharmD, BCPS Pager: [phone] 08/15/2016, 12:09 PM

## 2016-08-15 NOTE — Progress Notes (Signed)
Progress Note  08/15/2016 12:16 PM  Pt was unable to be discharged yesterday because still waiting on PASRR number.  Pt seen and examined today.  She is still stable for discharge.    Vitals:   08/14/16 2126 08/15/16 0658  BP: 120/72 (!) 146/74  Pulse: 82 94  Resp: 19 19  Temp: 98.6 F (37 C) 97.6 F (36.4 C)    General exam:sitting up in chair today, improved appearing, more alert and interactive, NAD.  Respiratory system: no wheezing heard, good air movement, occasional dry cough. Cardiovascular system:irregularly irregular, normal s1, s2 Gastrointestinal system:No organomegaly or masses felt. Normal bowel sounds heard. Central nervous system:Awake and alert  Murvin Natal, MD

## 2016-08-16 ENCOUNTER — Non-Acute Institutional Stay (SKILLED_NURSING_FACILITY): Payer: Medicare Other | Admitting: Internal Medicine

## 2016-08-16 DIAGNOSIS — I25119 Atherosclerotic heart disease of native coronary artery with unspecified angina pectoris: Secondary | ICD-10-CM

## 2016-08-16 DIAGNOSIS — J189 Pneumonia, unspecified organism: Secondary | ICD-10-CM | POA: Diagnosis not present

## 2016-08-16 DIAGNOSIS — A419 Sepsis, unspecified organism: Secondary | ICD-10-CM | POA: Diagnosis not present

## 2016-08-16 DIAGNOSIS — F32A Depression, unspecified: Secondary | ICD-10-CM

## 2016-08-16 DIAGNOSIS — E878 Other disorders of electrolyte and fluid balance, not elsewhere classified: Secondary | ICD-10-CM

## 2016-08-16 DIAGNOSIS — J45901 Unspecified asthma with (acute) exacerbation: Secondary | ICD-10-CM

## 2016-08-16 DIAGNOSIS — E785 Hyperlipidemia, unspecified: Secondary | ICD-10-CM

## 2016-08-16 DIAGNOSIS — R791 Abnormal coagulation profile: Secondary | ICD-10-CM

## 2016-08-16 DIAGNOSIS — I5023 Acute on chronic systolic (congestive) heart failure: Secondary | ICD-10-CM

## 2016-08-16 DIAGNOSIS — I4891 Unspecified atrial fibrillation: Secondary | ICD-10-CM

## 2016-08-16 DIAGNOSIS — F329 Major depressive disorder, single episode, unspecified: Secondary | ICD-10-CM

## 2016-08-16 DIAGNOSIS — F419 Anxiety disorder, unspecified: Secondary | ICD-10-CM

## 2016-08-16 DIAGNOSIS — I110 Hypertensive heart disease with heart failure: Secondary | ICD-10-CM

## 2016-08-16 LAB — PROTIME-INR
INR: 1.75
PROTHROMBIN TIME: 20.6 s — AB (ref 11.4–15.2)

## 2016-08-16 MED ORDER — WARFARIN SODIUM 3 MG PO TABS
3.0000 mg | ORAL_TABLET | Freq: Once | ORAL | Status: DC
  Filled 2016-08-16: qty 1

## 2016-08-16 NOTE — Progress Notes (Signed)
Date: August 16, 2016 Discharge orders checked for needs. No needs present at time of discharge. Velva Harman, RN, BSN, [place]   [phone]

## 2016-08-16 NOTE — Progress Notes (Addendum)
Speech Language Pathology Treatment: Dysphagia  Patient Details Name: Cindy [name] MRN: [id number] DOB: [birth year] Today's Date: 08/16/2016 Time: [id number] SLP Time Calculation (min) (ACUTE ONLY): 33 min  Assessment / Plan / Recommendation Clinical Impression  Pt with significant aphasia likely from prior CVA 2016.  She was willing to consume some lunch but only ate VERY SMALL bites (approximately 1/3 tsp) at a time and consumed a very minimal amount.  Pt's language deficits make it challenging for her to provide clinical information.  Single cough noted at baseline and 2 subtle coughs noted at end of snack (approx 2 ounces mashed potatoes, 1 ounce pudding and 4 ounces gingerale.  Oropharyngeal swallow appeared overall functional, however pt was wincing with swallowing gingerale indicating pain in pharynx.    She also reported sensation of food lodging in esophagus (proximal (cervical region) x1 and distal x1 (pointing near GE).  Provided pt with liquids to facilitate clearance of esophagus- which clinically appeared helpful.  Advised pt to continue this strategy.   Pt's intake is poor but she did not articulate why. She stated need to urinate with liquid consumption- encouraged her to call for assist.     Recommend continue dys3/thin diet with strict precautions and consideration for esophageal work up if dysphagia symptoms do resolved.   Follow up SLP at SNF indicated to continue dysphagia mitigation/maximizing nutrition.     HPI HPI: 75 yo female adm to [facility] with diagnosis of sepsis.  PMH + for cva, Barrett's esophagus, HTN, anxiety, depression.  PSH includes CEA.  Pt with right facial weakness likely due to prior cva.  Pt also with lordosis and spurring at C5-C6, C6-C7.  She has been having difficulty swallowing and speech/swallow eval ordered.  She is aphasic, likely also from prior cva.  From chart review, RN report of deficits and suspected pna -  MBS completed with findings of mild  oropharyngeal dysphagia and suspected primary esophageal issues/GERD/Barrett's.       SLP Plan  Continue with current plan of care     Recommendations  Diet recommendations: Dysphagia 3 (mechanical soft);Thin liquid Liquids provided via: Straw;Cup Medication Administration: Whole meds with puree (crushed if large and not contraindicated) Supervision: Patient able to self feed Compensations: Slow rate;Small sips/bites;Clear throat intermittently;Follow solids with liquid Postural Changes and/or Swallow Maneuvers: Seated upright 90 degrees (small frequent meals)                Oral Care Recommendations: Oral care BID Follow up Recommendations: Skilled Nursing facility Plan: Continue with current plan of care       Conrad, [place] [facility] SLP [phone]

## 2016-08-16 NOTE — Progress Notes (Signed)
Progress Note  08/16/2016 9:15 AM  Pt was unable to be discharged yesterday because still waiting on PASRR number.  Pt seen and examined today.  She is still stable for discharge.    Vitals:   08/15/16 2127 08/16/16 0507  BP: (!) 156/82 (!) 141/69  Pulse: 77 94  Resp: 16 18  Temp: 99.1 F (37.3 C) 98.2 F (36.8 C)    General exam:sitting up in chair today, improved appearing, more alert and interactive, NAD.  Respiratory system: no wheezing heard, good air movement, cough seems better today.  Cardiovascular system:irregularly irregular, normal s1, s2 Gastrointestinal system:No organomegaly or masses felt. Normal bowel sounds heard. Central nervous system:Awake and alert  Murvin Natal, MD

## 2016-08-16 NOTE — Progress Notes (Signed)
Called report to [employer] to [facility].  Patient left hospital via EMS.

## 2016-08-16 NOTE — Clinical Social Work Note (Signed)
Medical Social Worker facilitated patient discharge including contacting patient family and facility to confirm patient discharge plans.  Clinical information faxed to facility and family agreeable with plan.  MSW arranged ambulance transport via [facility] to [facility].  RN to call report prior to discharge [phone].  Medical Social Worker will sign off for now as social work intervention is no longer needed. Please consult [place] again if new need arises.  Glendon Axe, MSW [phone] 08/16/2016 12:25 PM

## 2016-08-16 NOTE — Progress Notes (Signed)
Physical Therapy Treatment Patient Details Name: MAKAYLYNN HEMRIC MRN: [id number] DOB: 10/27/1941 Today's Date: 08/16/2016    History of Present Illness 75 y.o. female admitted with sepsis, PNA. PMH of CVA, afib, HTN, asthma, GERD.     PT Comments    Pt cooperative, participates with encouragement; repeats same statements over and over (hx of aphasia d/t CVA); follows commands inconsistently; agree with plan for SNF  Follow Up Recommendations  SNF;Supervision/Assistance - 24 hour;Supervision for mobility/OOB     Equipment Recommendations  None recommended by PT    Recommendations for Other Services       Precautions / Restrictions Precautions Precautions: Fall Restrictions Weight Bearing Restrictions: No    Mobility  Bed Mobility Overal bed mobility: Needs Assistance Bed Mobility: Supine to Sit;Sit to Supine     Supine to sit: Mod assist Sit to supine: Max assist   General bed mobility comments: Max A to raise trunk and pivot hips, bed pad used to scoot  Transfers                 General transfer comment: NT--transport arrived to take pt to SNF  Ambulation/Gait                 Stairs            Wheelchair Mobility    Modified Rankin (Stroke Patients Only)       Balance                                    Cognition Arousal/Alertness: Awake/alert Behavior During Therapy: WFL for tasks assessed/performed Overall Cognitive Status: No family/caregiver present to determine baseline cognitive functioning Area of Impairment: Orientation;Following commands;Problem solving Orientation Level: Disoriented to;Person;Place;Time;Situation     Following Commands: Follows one step commands inconsistently;Follows one step commands with increased time     Problem Solving: Slow processing;Decreased initiation;Difficulty sequencing;Requires verbal cues;Requires tactile cues      Exercises General Exercises - Lower Extremity Ankle  Circles/Pumps: AROM;Both;10 reps;Seated Long Arc Quad: AROM;Strengthening;Both;10 reps;Seated    General Comments        Pertinent Vitals/Pain Pain Assessment: Faces Faces Pain Scale: Hurts a little bit Pain Location: L LE Pain Descriptors / Indicators: Grimacing Pain Intervention(s): Monitored during session    Home Living                      Prior Function            PT Goals (current goals can now be found in the care plan section) Acute Rehab PT Goals Patient Stated Goal: unable to state PT Goal Formulation: Patient unable to participate in goal setting Time For Goal Achievement: 08/27/16 Potential to Achieve Goals: Fair Progress towards PT goals: Progressing toward goals    Frequency    Min 3X/week      PT Plan Current plan remains appropriate    Co-evaluation             End of Session   Activity Tolerance: Patient tolerated treatment well;Other (comment) (transport arrived, tx session shortened) Patient left: in bed;with call bell/phone within reach;with bed alarm set     Time: [id number] PT Time Calculation (min) (ACUTE ONLY): 11 min  Charges:  $Therapeutic Activity: 8-22 mins                    G Codes:      Kenyon Ana  08/16/2016, 2:12 PM

## 2016-08-16 NOTE — Clinical Social Work Note (Signed)
FL-2, H/P, and 30 day note faxed to [facility] MUST for PASARR review.   Updated discharge summary sent via [facility]UB to [facility].   Barrier to d/c: currently awaiting PASARR.   Glendon Axe, MSW [phone] 08/16/2016 9:59 AM

## 2016-08-16 NOTE — Clinical Social Work Placement (Signed)
   CLINICAL SOCIAL WORK PLACEMENT  NOTE  Date:  08/16/2016  Patient Details  Name: Cindy [name] MRN: [id number] Date of Birth: 07-Jun-1941  Clinical Social Work is seeking post-discharge placement for this patient at the [place] level of care (*CSW will initial, date and re-position this form in  chart as items are completed):  Yes   Patient/family provided with [place] Work Department's list of facilities offering this level of care within the geographic area requested by the patient (or if unable, by the patient's family).  Yes   Patient/family informed of their freedom to choose among providers that offer the needed level of care, that participate in Medicare, Medicaid or managed care program needed by the patient, have an available bed and are willing to accept the patient.  Yes   Patient/family informed of [facility]'s ownership interest in [facility] and [facility], as well as of the fact that they are under no obligation to receive care at these facilities.  PASRR submitted to [facility]DS on 08/14/16     PASRR number received on 08/16/16     Existing PASRR number confirmed on       FL2 transmitted to all facilities in geographic area requested by pt/family on 08/16/16     FL2 transmitted to all facilities within larger geographic area on 08/12/16     Patient informed that his/her managed care company has contracts with or will negotiate with certain facilities, including the following:  [facility]     Yes   Patient/family informed of bed offers received.  Patient chooses bed at [facility]     Physician recommends and patient chooses bed at      Patient to be transferred to [facility] on 08/16/16.  Patient to be transferred to facility by [facility]AR     Patient family notified on 08/16/16 of transfer.  Name of family member notified:  Cindy [name] (Dtr, Cindy [name] )     PHYSICIAN Please  prepare priority discharge summary, including medications, Please sign FL2     Additional Comment:    _______________________________________________ Glendon Axe A 08/16/2016, 12:24 PM

## 2016-08-16 NOTE — Progress Notes (Signed)
[facility] for Warfarin Indication: atrial fibrillation  No Known Allergies  Patient Measurements: Height: 5\' 1"  (154.9 cm) Weight: 150 lb 9.2 oz (68.3 kg) IBW/kg (Calculated) : 47.8  Vital Signs: Temp: 98.2 F (36.8 C) (09/18 0507) Temp Source: Oral (09/18 0507) BP: 141/69 (09/18 0507) Pulse Rate: 94 (09/18 0507)  Labs:  Recent Labs  08/14/16 0548 08/15/16 1053 08/16/16 0503  HGB  --  9.9*  --   HCT  --  30.7*  --   PLT  --  397  --   LABPROT 18.1* 20.4* 20.6*  INR 1.48 1.72 1.75    Estimated Creatinine Clearance: 47.3 mL/min (by C-G formula based on SCr of 0.88 mg/dL).   Medications:  Scheduled:  . arformoterol  15 mcg Nebulization BID  . aspirin  81 mg Oral Daily  . budesonide (PULMICORT) nebulizer solution  0.5 mg Nebulization BID  . carvedilol  3.125 mg Oral BID WC  . cyanocobalamin  500 mcg Oral Daily  . doxycycline  100 mg Oral Q12H  . fluticasone  2 spray Each Nare Daily  . isosorbide mononitrate  30 mg Oral Daily  . levalbuterol  0.63 mg Nebulization BID  . loratadine  10 mg Oral Daily  . losartan  50 mg Oral QPM  . pantoprazole  40 mg Oral Daily  . predniSONE  20 mg Oral Q breakfast  . simvastatin  10 mg Oral QHS  . traZODone  150 mg Oral QHS  . Warfarin - Pharmacist Dosing Inpatient   Does not apply q1800   Assessment: Cindy [name] is a 75 y.o. female admitted on 08/09/2016 with pneumonia and known to pharmacy from Vancomycin & Cefepime dosing.  PMH also includes warfarin for hx of Afib, hx CVA.  Pharmacy is consulted to dose warfarin.   PTA Warfarin 4mg  daily except 6mg  on Sunday, last dose 9/11 17:00.  INR upon arrival in ED > 10.  Vitamin K 5mg  PO x 1 given @ 23:47  Today, 08/16/2016:  INR remains subtherapeutic at 1.75  CBC: Hgb sl decreased but stable; Plt remain WNL  No bleeding reported or documented  Diet: heart healthy  Drug-drug interactions: Doxycycline may increase INR.  Goal of  Therapy:  INR 2-3  Plan:   Warfarin 3 mg PO today  Given high INR on admission and current doxycycline, would discharge on 2.5-3 mg daily with INR follow-up in 3-5 days  Daily PT/INR; CBC q72 hr while on warfarin  Monitor for signs and symptoms of bleeding.   Royetta Asal, PharmD, BCPS Pager [phone] 08/16/2016 10:05 AM

## 2016-08-20 ENCOUNTER — Ambulatory Visit: Payer: Medicare Other | Admitting: Interventional Cardiology

## 2016-08-21 ENCOUNTER — Encounter: Payer: Self-pay | Admitting: Internal Medicine

## 2016-08-21 DIAGNOSIS — E878 Other disorders of electrolyte and fluid balance, not elsewhere classified: Secondary | ICD-10-CM | POA: Insufficient documentation

## 2016-08-21 DIAGNOSIS — I5022 Chronic systolic (congestive) heart failure: Secondary | ICD-10-CM | POA: Insufficient documentation

## 2016-08-21 DIAGNOSIS — R791 Abnormal coagulation profile: Secondary | ICD-10-CM | POA: Insufficient documentation

## 2016-08-21 DIAGNOSIS — E785 Hyperlipidemia, unspecified: Secondary | ICD-10-CM | POA: Insufficient documentation

## 2016-08-21 DIAGNOSIS — J45901 Unspecified asthma with (acute) exacerbation: Secondary | ICD-10-CM | POA: Insufficient documentation

## 2016-08-21 DIAGNOSIS — I4891 Unspecified atrial fibrillation: Secondary | ICD-10-CM | POA: Insufficient documentation

## 2016-08-21 NOTE — Assessment & Plan Note (Signed)
SNF - controlled; cont cozaar 50 mg daily, coreg BID

## 2016-08-21 NOTE — Assessment & Plan Note (Signed)
SNF - stable - cont klonopin

## 2016-08-21 NOTE — Assessment & Plan Note (Signed)
SNF - d/c Na+ 134, K+ 4.7; will f/u BMP

## 2016-08-21 NOTE — Assessment & Plan Note (Signed)
SNF - stable - cont Imdur, coreg , ASA NTG SL prn and zocor

## 2016-08-21 NOTE — Assessment & Plan Note (Signed)
SNF - pt's zoloft was d/c 2/2 prolonged QT; will monitor for signs of recurence

## 2016-08-21 NOTE — Assessment & Plan Note (Signed)
SNF - not stated as uncontrolled;cont zocor 10 mg daily

## 2016-08-21 NOTE — Progress Notes (Addendum)
: Provider:   Location:  [place] Room Number: [place] of Service:  SNF (31)  PCP: [name], MD Patient Care Team: [name], MD as PCP - General (Family Medicine)  Extended Emergency Contact Information Primary Emergency Contact: Tyner,[name] Address: [address] [place],  [address] [place] Phone: [phone] Work Phone: [phone] Relation: Daughter Secondary Emergency Contact: [place] Phone: [phone] Relation: Niece     Allergies: Review of patient's allergies indicates no known allergies.  Chief Complaint  Patient presents with  . New Admit To SNF    Admit to Facility    HPI: Patient is 75 y.o. female who had a CVA following carotid endarterectomy, atrial fibrillation on chronic anticoagulation, HTN, anxiety/depression, gerd; who presents with complaints of shortness of breath progressively worsening over the last 3 days. She was noted to have a productive cough with associated symptoms included shortness of breath, wheezing, nausea, vomiting, and diaphoresis. She was noted to have had a fever of 101F. Pt was admitted to [facility] from [id number] where she was dx with sepsis 2/2/ LLL PNA. Tx was started with vanc and cefepime. Hospital course was complicated by acute asthma exacerbation,mild acute on chronic CHF, urinary retention  and AF with supratheraputic  INR, and electrolyte abnormalities, all resolved. Pt is admitted to SNF for generalized weakness for OT/PT. While at SNF pt weill be followed for CAD, tx with ASA and isosorbide, HTN, tx with cozaar and HLD, tx with zocor.  Past Medical History:  Diagnosis Date  . Angina pectoris ([place])   . Anxiety   . Asthma   . Barrett's esophagus   . Bronchitis   . 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 Chest pain   . Coronary artery disease    right carotid occlusion   . Depression   . Dysrhythmia    atrial fibrillation    . GERD (gastroesophageal reflux disease)   . Hyperlipidemia   . Hypertension   . Recurrent upper respiratory infection (URI)   . Stroke [facility])    2007, after left endarterectomy    Past Surgical History:  Procedure Laterality Date  . ABDOMINAL HYSTERECTOMY    . BALLOON DILATION  10/18/2012   Procedure: BALLOON DILATION;  Surgeon: Lear Ng, MD;  Location: [facility] ENDOSCOPY;  Service: Endoscopy;  Laterality: N/A;  . CARDIAC CATHETERIZATION    . CAROTID ENDARTERECTOMY     left, complicated by stroke  . CHOLECYSTECTOMY     incidental at time of colectomy  . COLONOSCOPY  12/01/2011   Procedure: COLONOSCOPY;  Surgeon: Lear Ng, MD;  Location: [facility] ENDOSCOPY;  Service: Endoscopy;  Laterality: N/A;  . COLONOSCOPY  10/18/2012   Procedure: COLONOSCOPY;  Surgeon: Lear Ng, MD;  Location: [facility] ENDOSCOPY;  Service: Endoscopy;  Laterality: N/A;  colonic dilation  . ESOPHAGOGASTRODUODENOSCOPY  12/01/2011   Procedure: ESOPHAGOGASTRODUODENOSCOPY (EGD);  Surgeon: Lear Ng, MD;  Location: [facility];  Service: Endoscopy;  Laterality: N/A;  . HOT HEMOSTASIS  12/01/2011   Procedure: HOT HEMOSTASIS (ARGON PLASMA COAGULATION/BICAP);  Surgeon: Lear Ng, MD;  Location: [facility];  Service: Endoscopy;  Laterality: N/A;  . 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 Changes to the med  list made in this visit will be reflected in the After Visit Summary of the admission.    Current Outpatient Prescriptions on File Prior to Visit  Medication Sig Dispense Refill  . acetaminophen (TYLENOL) 325 MG tablet Take 2 tablets (650 mg total) by mouth every 6 (six) hours as needed for mild pain (or Fever >/= 101).    Marland Kitchen albuterol (PROVENTIL HFA;VENTOLIN HFA) 108 (90 BASE) MCG/ACT inhaler Inhale 2 puffs into the lungs every 6 (six) hours as needed for shortness of  breath.     Marland Kitchen alendronate (FOSAMAX) 70 MG tablet Take 70 mg by mouth every Monday. Take with a full glass of water on an empty stomach.    Marland Kitchen aspirin EC 81 MG tablet Take 81 mg by mouth daily.    . baclofen (LIORESAL) 10 MG tablet Take 10 mg by mouth 2 (two) times daily as needed for muscle spasms.    . budesonide-formoterol (SYMBICORT) 160-4.5 MCG/ACT inhaler Inhale 2 puffs into the lungs 2 (two) times daily.    . calcium citrate-vitamin D 200-200 MG-UNIT TABS Take 1 tablet by mouth daily.     . carvedilol (COREG) 3.125 MG tablet Take 1 tablet (3.125 mg total) by mouth 2 (two) times daily with a meal.    . Chloroxylenol-Zinc Oxide (BAZA EX) Apply 1 application topically 2 (two) times daily as needed (affected areas on buttocks/sacral.).    Marland Kitchen cholecalciferol (VITAMIN D) 1000 UNITS tablet Take 1,000 Units by mouth daily.    . clonazePAM (KLONOPIN) 0.5 MG tablet Take 0.5 tablets (0.25 mg total) by mouth 2 (two) times daily as needed for anxiety. 15 tablet 0  . cyanocobalamin 500 MCG tablet Take 500 mcg by mouth daily.    . ferrous sulfate 325 (65 FE) MG tablet Take 325 mg by mouth daily with breakfast.     . fluticasone (FLONASE) 50 MCG/ACT nasal spray Place 2 sprays into both nostrils daily.     Marland Kitchen guaifenesin (ROBAFEN) 100 MG/5ML syrup Take 200 mg by mouth every 6 (six) hours as needed for cough.    Marland Kitchen ipratropium-albuterol (DUONEB) 0.5-2.5 (3) MG/3ML SOLN Take 3 mLs by nebulization every 4 (four) hours as needed (wheezing, coughing, SOB). 360 mL 0  . isosorbide mononitrate (IMDUR) 30 MG 24 hr tablet Take 30 mg by mouth daily.    Marland Kitchen loratadine (CLARITIN) 10 MG tablet Take 10 mg by mouth daily.    Marland Kitchen losartan (COZAAR) 50 MG tablet Take 50 mg by mouth every evening.    . Melatonin 3 MG CAPS Take 3 mg by mouth at bedtime.     [place]ell Face Oil Heavy OIL Place 1 drop into the right ear every Monday.     . nitroGLYCERIN (NITROSTAT) 0.4 MG SL tablet Place 0.4 mg under the tongue every 5 (five) minutes as needed  for chest pain (CALL 9-1-1 IF NO RELIEF AFTER THREE DOSES). For chest pain    . omeprazole (PRILOSEC) 20 MG capsule Take 40 mg by mouth daily.     . phenol (QC SORE THROAT SPRAY) 1.4 % LIQD Use as directed 5 sprays in the mouth or throat every 2 (two) hours as needed for throat irritation / pain.     . polyethylene glycol (MIRALAX / GLYCOLAX) packet Take 17 g by mouth daily as needed for mild constipation.    . simvastatin (ZOCOR) 10 MG tablet Take 10 mg by mouth at bedtime.    . sodium chloride (OCEAN) 0.65 % SOLN nasal spray Place 2  sprays into both nostrils every 4 (four) hours. Scheduled while awake only & as needed following nose bleed.    . tiotropium (SPIRIVA) 18 MCG inhalation capsule Place 18 mcg into inhaler and inhale daily.    . traZODone (DESYREL) 150 MG tablet Take 150 mg by mouth at bedtime.    Marland Kitchen warfarin (COUMADIN) 2.5 MG tablet Take 1 tablet (2.5 mg total) by mouth daily at 6 PM. 15 tablet 0   No current facility-administered medications on file prior to visit.      No orders of the defined types were placed in this encounter.   Immunization History  Administered Date(s) Administered  . Pneumococcal Polysaccharide-23 01/08/2012    Social History  Substance Use Topics  . Smoking status: Former Smoker    Quit date: 04/09/2003  . Smokeless tobacco: Never Used  . Alcohol use No    Family history is   Family History  Problem Relation Age of Onset  . Heart disease Mother   . Cancer Mother   . Heart attack Father   . Cancer Sister     ovarian      Review of Systems  DATA OBTAINED: from patient, nurse GENERAL:  no fevers, fatigue, appetite changes SKIN: No itching, or rash EYES: No eye pain, redness, discharge EARS: No earache, tinnitus, change in hearing NOSE: No congestion, drainage or bleeding  MOUTH/THROAT: No mouth or tooth pain, No sore throat RESPIRATORY: occ  cough, no wheezing, SOB CARDIAC: No chest pain, palpitations, lower extremity edema  GI:  No abdominal pain, No N/V/D or constipation, No heartburn or reflux  GU: No dysuria, frequency or urgency, or incontinence  MUSCULOSKELETAL: No unrelieved bone/joint pain NEUROLOGIC: No headache, dizziness or focal weakness PSYCHIATRIC: No c/o anxiety or sadness   Vitals:   08/16/16 1115  BP: (!) 141/69  Pulse: 94  Resp: 18  Temp: 98.2 F (36.8 C)    SpO2 Readings from Last 1 Encounters:  08/16/16 97%   Body mass index is 28.78 kg/m.     Physical Exam  GENERAL APPEARANCE: Alert,mod  conversant,  No acute distress.  SKIN: No diaphoresis rash HEAD: Normocephalic, atraumatic  EYES: Conjunctiva/lids clear. Pupils round, reactive. EOMs intact.  EARS: External exam WNL, canals clear. Hearing grossly normal.  NOSE: No deformity or discharge.  MOUTH/THROAT: Lips w/o lesions  RESPIRATORY: Breathing is even, unlabored. Lung sounds are clear   CARDIOVASCULAR: Heart irreg,  no murmurs, rubs or gallops. No peripheral edema.   GASTROINTESTINAL: Abdomen is soft, non-tender, not distended w/ normal bowel sounds. GENITOURINARY: Bladder non tender, not distended  MUSCULOSKELETAL: No abnormal joints or musculature NEUROLOGIC:  Cranial nerves 2-12 grossly intact. Moves all extremities  PSYCHIATRIC: Mood and affect appropriate to situation, no behavioral issues  Patient Active Problem List   Diagnosis Date Noted  . Asthma with acute exacerbation 08/21/2016  . Atrial fibrillation with RVR ([place]) 08/21/2016  . Elevated INR 08/21/2016  . Acute on chronic systolic CHF (congestive heart failure) ([place]) 08/21/2016  . Hyperlipidemia 08/21/2016  . Electrolyte abnormality 08/21/2016  . HCAP (healthcare-associated pneumonia) 08/10/2016  . Sepsis ([place]) 08/09/2016  . Coronary atherosclerosis of native coronary artery 01/21/2014  . Obesity 01/17/2014  . Abdominal pain, generalized 10/18/2012  . GERD (gastroesophageal reflux disease)   . Depression   . Anxiety   . Hypertensive heart disease with  CHF (congestive heart failure) ([place])   . Stroke ([place])   . Chest pain 01/07/2012  . Asthma 01/07/2012  . A-fib ([place]) 01/07/2012  .  CVA (cerebral infarction) 01/07/2012  . Anemia 01/07/2012  . ANEMIA 07/29/2010  . BARRETTS ESOPHAGUS 06/04/2010      Labs reviewed: Basic Metabolic Panel:    Component Value Date/Time   NA 134 (L) 08/12/2016 0527   NA 134 (A) 08/12/2016   K 4.7 08/12/2016 0527   CL 101 08/12/2016 0527   CO2 26 08/12/2016 0527   GLUCOSE 123 (H) 08/12/2016 0527   BUN 29 (H) 08/12/2016 0527   BUN 29 (A) 08/12/2016   CREATININE 0.88 08/12/2016 0527   CALCIUM 10.0 08/12/2016 0527   PROT 5.9 (L) 08/12/2016 0527   ALBUMIN 2.7 (L) 08/12/2016 0527   AST 110 (H) 08/12/2016 0527   ALT 127 (H) 08/12/2016 0527   ALKPHOS 88 08/12/2016 0527   BILITOT 1.0 08/12/2016 0527   GFRNONAA >60 08/12/2016 0527   GFRAA >60 08/12/2016 0527     Recent Labs  08/09/16 2029 08/10/16 08/10/16 0613 08/12/16 08/12/16 0527  NA 132* 132* 132* 134* 134*  K 3.3* 4.0 4.0  --  4.7  CL 98*  --  100*  --  101  CO2 24  --  21*  --  26  GLUCOSE 96  --  150*  --  123*  BUN 23* 23* 23* 29* 29*  CREATININE 0.96 0.8 0.83 0.9 0.88  CALCIUM 10.1  --  9.9  --  10.0   Liver Function Tests:  Recent Labs  08/09/16 08/09/16 2029 08/12/16 0527  AST 100* 100* 110*  ALT 80* 80* 127*  ALKPHOS 93 93 88  BILITOT  --  1.2 1.0  PROT  --  6.6 5.9*  ALBUMIN  --  3.1* 2.7*   No results for input(s): LIPASE, AMYLASE in the last 8760 hours. No results for input(s): AMMONIA in the last 8760 hours. CBC:  Recent Labs  08/09/16 2029  08/10/16 0613 08/12/16 08/12/16 0527 08/15/16 1053  WBC 15.8*  < > 17.5* 23.0 23.0* 16.9*  NEUTROABS 14.1*  --   --   --  20.5*  --   HGB 10.6*  < > 10.9* 9.5* 9.5* 9.9*  HCT 32.3*  < > 33.0* 29* 29.1* 30.7*  MCV 81.2  --  81.5  --  82.0 83.0  PLT 270  < > 262 337 337 397  < > = values in this interval not displayed. Lipid No results for input(s): CHOL, HDL,  LDLCALC, TRIG in the last 8760 hours.  Cardiac Enzymes:  Recent Labs  08/10/16 0111 08/10/16 0613 08/10/16 1153  TROPONINI 0.07* 0.08* 0.08*   BNP:  Recent Labs  08/09/16 2029  BNP 339.5*   No results found for: MICROALBUR No results found for: HGBA1C No results found for: TSH No results found for: VITAMINB12 No results found for: FOLATE No results found for: IRON, TIBC, FERRITIN  Imaging and Procedures obtained prior to SNF admission: Dg Chest 2 View  Result Date: 08/09/2016 CLINICAL DATA:  Cough, shortness of breath and fever. EXAM: CHEST  2 VIEW COMPARISON:  04/26/2016. FINDINGS: Stable enlarged cardiac silhouette. Interval patchy opacity in the left lower lobe. Diffuse peribronchial thickening and accentuation of the interstitial markings with progression. Interval minimal patchy opacity at the right lung base. Small left pleural effusion. IMPRESSION: 1. Left lower lobe pneumonia with a small left pleural effusion. 2. Minimal patchy atelectasis or pneumonia at the right lung base. 3. Moderate diffuse bronchitic changes. 4. Stable cardiomegaly. Electronically Signed   By: Claudie Revering M.D.   On: 08/09/2016 20:46  Not all labs, radiology exams or other studies done during hospitalization come through on my EPIC note; however they are reviewed by me.    Assessment and Plan  SEPSIS/LLL PNA - blood cx NGTD, vanc and cefepime for 3 days then doxycyline for 5 days ; O2 weaned to RA SNF - finish course of Doxycycline  ACUTE ASTHMA EXACERBATION- O2 was weaned to RA, prednisone tapered to 20 mg SNF - cont prednisone 20 mg daily, albuterol 2 puffs q 6 prn, symbicort 160-4.5 2 puffs BID, duoneb q 4 prn, spiriva 18 mcg daily and mucinex 100/5 2 tsp q6 prn  AF WITH RVR/ SUPRA THERAPUTIC INR-  Low dose coreg was started and warfarin held until 9/14 and restarted 2.5 mg daily SNF - cont coreg 3.125 mg BID and coumadin 2.5 mg daily; will actively titrate  ACUTE ON CHRONIC sCHF -  EF 40-45% ; coreg was added;pt already on lasartan SNF - cont coreg 3.215 mg BID and cozaar 50 mg daily  Coronary atherosclerosis of native coronary artery SNF - stable - cont Imdur, coreg , ASA NTG SL prn and zocor   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HCC) SNF - controlled; cont cozaar 50 mg daily, coreg BID  Anxiety SNF - stable - cont klonopin  Depression SNF - pt's zoloft was d/c 2/2 prolonged QT; will monitor for signs of recurence  Hyperlipidemia SNF - not stated as uncontrolled;cont zocor 10 mg daily  Electrolyte abnormality SNF - d/c Na+ 134, K+ 4.7; will f/u BMP   Time spent > 45 min;> 50% of time with patient was spent reviewing records, labs, tests and studies, counseling and developing plan of care  Inocencio Homes, MD

## 2016-08-23 ENCOUNTER — Non-Acute Institutional Stay (SKILLED_NURSING_FACILITY): Payer: Medicare Other | Admitting: Internal Medicine

## 2016-08-23 ENCOUNTER — Encounter: Payer: Self-pay | Admitting: Internal Medicine

## 2016-08-23 DIAGNOSIS — Z7189 Other specified counseling: Secondary | ICD-10-CM | POA: Diagnosis not present

## 2016-08-23 DIAGNOSIS — F05 Delirium due to known physiological condition: Secondary | ICD-10-CM | POA: Diagnosis not present

## 2016-08-23 DIAGNOSIS — R410 Disorientation, unspecified: Secondary | ICD-10-CM

## 2016-08-23 NOTE — Progress Notes (Signed)
Location:  [facility] Room Number: 106P Place of Service:  SNF (31)  Cindy Delaine. [name], MD  Patient Care Team: [name], MD as PCP - [facility])  Extended Emergency Contact Information Primary Emergency Contact: Tyner,Cindy [name] Address: [address] [place], [place] [address] [name] of [place] Phone: [phone] Work Phone: [phone] Relation: Daughter Secondary Emergency Contact: Cindy [name] [place] of [place] Phone: [phone] Relation: Niece    Allergies: Review of patient's allergies indicates no known allergies.  Chief Complaint  Patient presents with  . Acute Visit    Acute    HPI: Patient is 75 y.o. female who is being seen today because her daughter is concerned that her mother is still confused. She was confused in the hospital and has been confused since and it is not getting better.She needs to to be fed, she can't eat by herself. In the hospital she was told her mother had dementia but she doesn't really believe that. She can't think of anything that makes the confusion better or worse.  Past Medical History:  Diagnosis Date  . Angina pectoris ([place])   . Anxiety   . Asthma   . Barrett's esophagus   . Bronchitis   . 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 Chest pain   . Coronary artery disease    right carotid occlusion   . Depression   . Dysrhythmia    atrial fibrillation  . GERD (gastroesophageal reflux disease)   . Hyperlipidemia   . Hypertension   . Recurrent upper respiratory infection (URI)   . Stroke [facility])    2007, after left endarterectomy    Past Surgical History:  Procedure Laterality Date  . ABDOMINAL HYSTERECTOMY    . BALLOON DILATION  10/18/2012   Procedure: BALLOON DILATION;  Surgeon: Lear Ng, MD;  Location: [facility] ENDOSCOPY;  Service: Endoscopy;  Laterality: N/A;  . CARDIAC CATHETERIZATION    . CAROTID ENDARTERECTOMY     left, complicated by stroke    . CHOLECYSTECTOMY     incidental at time of colectomy  . COLONOSCOPY  12/01/2011   Procedure: COLONOSCOPY;  Surgeon: Lear Ng, MD;  [facility];  Service: Endoscopy;  Laterality: N/A;  . COLONOSCOPY  10/18/2012   Procedure: COLONOSCOPY;  Surgeon: Lear Ng, MD;  [facility];  Service: Endoscopy;  Laterality: N/A;  colonic dilation  . ESOPHAGOGASTRODUODENOSCOPY  12/01/2011   Procedure: ESOPHAGOGASTRODUODENOSCOPY (EGD);  Surgeon: Lear Ng, MD;  Location: [facility];  Service: Endoscopy;  Laterality: N/A;  . HOT HEMOSTASIS  12/01/2011   Procedure: HOT HEMOSTASIS (ARGON PLASMA COAGULATION/BICAP);  Surgeon: Lear Ng, MD;  Location: [facility];  Service: Endoscopy;  Laterality: N/A;  . 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 Always use your most recent med list.          acetaminophen 325 MG tablet Commonly known as:  TYLENOL Take 2 tablets (650 mg total) by mouth every 6 (six) hours as needed for mild pain (or Fever >/= 101).   albuterol 108 (90 Base) MCG/ACT inhaler Commonly known as:  PROVENTIL HFA;VENTOLIN HFA Inhale 2 puffs into the lungs every 6 (six) hours as needed for shortness of breath.   alendronate 70 MG tablet Commonly  known as:  FOSAMAX Take 70 mg by mouth every Monday. Take with a full glass of water on an empty stomach.   aspirin EC 81 MG tablet Take 81 mg by mouth daily.   baclofen 10 MG tablet Commonly known as:  LIORESAL Take 10 mg by mouth 2 (two) times daily as needed for muscle spasms.   BAZA EX Apply 1 application topically 2 (two) times daily as needed (affected areas on buttocks/sacral.).   budesonide-formoterol 160-4.5 MCG/ACT inhaler Commonly known as:  SYMBICORT Inhale 2 puffs into the lungs 2 (two) times daily.   carvedilol 3.125 MG tablet Commonly known as:  COREG Take 1 tablet  (3.125 mg total) by mouth 2 (two) times daily with a meal.   clonazePAM 0.5 MG tablet Commonly known as:  KLONOPIN Take 0.5 tablets (0.25 mg total) by mouth 2 (two) times daily as needed for anxiety.   ENSURE Take 237 mLs by mouth 3 (three) times daily between meals.       Meds ordered this encounter  Medications  . DISCONTD: Dimethicone-Zinc Oxide 5-5 % CREA    Sig: Apply 1 application topically 2 (two) times daily as needed. For affected areas on the buttocks and sacral  . ENSURE (ENSURE)    Sig: Take 237 mLs by mouth 3 (three) times daily between meals.    Immunization History  Administered Date(s) Administered  . Pneumococcal Polysaccharide-23 01/08/2012    Social History  Substance Use Topics  . Smoking status: Former Smoker    Quit date: 04/09/2003  . Smokeless tobacco: Never Used  . Alcohol use No    Review of Systems  DATA OBTAINED: from patient - cant participate fully;nurse GENERAL:  no fevers, fatigue, appetite changes SKIN: No itching, rash HEENT: No complaint RESPIRATORY: No cough, wheezing, SOB CARDIAC: No chest pain, palpitations, lower extremity edema  GI: No abdominal pain, No N/V/D or constipation, No heartburn or reflux  GU: No dysuria, frequency or urgency, or incontinence  MUSCULOSKELETAL: No unrelieved bone/joint pain NEUROLOGIC: No headache, dizziness  PSYCHIATRIC: No overt anxiety or sadness  Vitals:   08/23/16 1435  BP: 132/70  Pulse: 80  Resp: 18  Temp: (!) 96.7 F (35.9 C)   Body mass index is 27.17 kg/m. Physical Exam  GENERAL APPEARANCE: Alert, conversant, No acute distress  SKIN: No diaphoresis rash HEENT: Unremarkable RESPIRATORY: Breathing is even, unlabored. Lung sounds are clear   CARDIOVASCULAR: Heart RRR no murmurs, rubs or gallops. No peripheral edema  GASTROINTESTINAL: Abdomen is soft, non-tender, not distended w/ normal bowel sounds.  GENITOURINARY: Bladder non tender, not distended  MUSCULOSKELETAL: No abnormal  joints or musculature NEUROLOGIC: Cranial nerves 2-12 grossly intact. Moves all extremities PSYCHIATRIC: confused, no behavioral issues  Patient Active Problem List   Diagnosis Date Noted  . Asthma with acute exacerbation 08/21/2016  . Atrial fibrillation with RVR ([place]) 08/21/2016  . Elevated INR 08/21/2016  . Acute on chronic systolic CHF (congestive heart failure) ([place]) 08/21/2016  . Hyperlipidemia 08/21/2016  . Electrolyte abnormality 08/21/2016  . HCAP (healthcare-associated pneumonia) 08/10/2016  . Sepsis ([place]) 08/09/2016  . Coronary atherosclerosis of native coronary artery 01/21/2014  . Obesity 01/17/2014  . Abdominal pain, generalized 10/18/2012  . GERD (gastroesophageal reflux disease)   . Depression   . Anxiety   . 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[place])   . Stroke ([place])   . Chest pain 01/07/2012  . Asthma 01/07/2012  . A-fib ([place]) 01/07/2012  . CVA (cerebral infarction) 01/07/2012  .  Anemia 01/07/2012  . ANEMIA 07/29/2010  . BARRETTS ESOPHAGUS 06/04/2010    CMP     Component Value Date/Time   NA 134 (L) 08/12/2016 0527   NA 134 (A) 08/12/2016   K 4.7 08/12/2016 0527   CL 101 08/12/2016 0527   CO2 26 08/12/2016 0527   GLUCOSE 123 (H) 08/12/2016 0527   BUN 29 (H) 08/12/2016 0527   BUN 29 (A) 08/12/2016   CREATININE 0.88 08/12/2016 0527   CALCIUM 10.0 08/12/2016 0527   PROT 5.9 (L) 08/12/2016 0527   ALBUMIN 2.7 (L) 08/12/2016 0527   AST 110 (H) 08/12/2016 0527   ALT 127 (H) 08/12/2016 0527   ALKPHOS 88 08/12/2016 0527   BILITOT 1.0 08/12/2016 0527   GFRNONAA >60 08/12/2016 0527   GFRAA >60 08/12/2016 0527    Recent Labs  08/09/16 2029 08/10/16 08/10/16 0613 08/12/16 08/12/16 0527  NA 132* 132* 132* 134* 134*  K 3.3* 4.0 4.0  --  4.7  CL 98*  --  100*  --  101  CO2 24  --  21*  --  26  GLUCOSE 96  --  150*  --  123*  BUN 23* 23* 23* 29* 29*  CREATININE 0.96 0.8 0.83 0.9 0.88  CALCIUM 10.1  --  9.9  --  10.0     Recent Labs  08/09/16 08/09/16 2029 08/12/16 0527  AST 100* 100* 110*  ALT 80* 80* 127*  ALKPHOS 93 93 88  BILITOT  --  1.2 1.0  PROT  --  6.6 5.9*  ALBUMIN  --  3.1* 2.7*    Recent Labs  08/09/16 2029  08/10/16 0613 08/12/16 08/12/16 0527 08/15/16 1053  WBC 15.8*  < > 17.5* 23.0 23.0* 16.9*  NEUTROABS 14.1*  --   --   --  20.5*  --   HGB 10.6*  < > 10.9* 9.5* 9.5* 9.9*  HCT 32.3*  < > 33.0* 29* 29.1* 30.7*  MCV 81.2  --  81.5  --  82.0 83.0  PLT 270  < > 262 337 337 397  < > = values in this interval not displayed. No results for input(s): CHOL, LDLCALC, TRIG in the last 8760 hours.  Invalid input(s): HCL No results found for: MICROALBUR No results found for: TSH No results found for: HGBA1C No results found for: CHOL, HDL, LDLCALC, LDLDIRECT, TRIG, CHOLHDL  Significant Diagnostic Results in last 30 days:  Dg Chest 2 View  Result Date: 08/09/2016 CLINICAL DATA:  Cough, shortness of breath and fever. EXAM: CHEST  2 VIEW COMPARISON:  04/26/2016. FINDINGS: Stable enlarged cardiac silhouette. Interval patchy opacity in the left lower lobe. Diffuse peribronchial thickening and accentuation of the interstitial markings with progression. Interval minimal patchy opacity at the right lung base. Small left pleural effusion. IMPRESSION: 1. Left lower lobe pneumonia with a small left pleural effusion. 2. Minimal patchy atelectasis or pneumonia at the right lung base. 3. Moderate diffuse bronchitic changes. 4. Stable cardiomegaly. Electronically Signed   By: Claudie Revering M.D.   On: 08/09/2016 20:46   Dg Swallowing Func-speech Pathology  Result Date: 08/13/2016 Objective Swallowing Evaluation: Type of Study: Bedside Swallow Evaluation Patient Details Name: Cindy [name] MRN: [id number] Date of Birth: [birth year] Today's Date: 08/13/2016 Time: SLP Start Time (ACUTE ONLY): 1226-SLP Stop Time (ACUTE ONLY): 1245 SLP Time Calculation (min) (ACUTE ONLY): 19 min Past Medical History: Past  Medical History: Diagnosis Date . Angina pectoris ([place])  . Anxiety  . Asthma  . Barrett's  esophagus  . Bronchitis  . Cancer ([place])   uterian . Chest pain  . Coronary artery disease   right carotid occlusion  . Depression  . Dysrhythmia   atrial fibrillation . GERD (gastroesophageal reflux disease)  . Hyperlipidemia  . Hypertension  . Recurrent upper respiratory infection (URI)  . Stroke [facility])   2007, after left endarterectomy Past Surgical History: Past Surgical History: Procedure Laterality Date . ABDOMINAL HYSTERECTOMY   . BALLOON DILATION  10/18/2012  Procedure: BALLOON DILATION;  Surgeon: Lear Ng, MD;  [facility];  Service: Endoscopy;  Laterality: N/A; . CARDIAC CATHETERIZATION   . CAROTID ENDARTERECTOMY    left, complicated by stroke . CHOLECYSTECTOMY    incidental at time of colectomy . COLONOSCOPY  12/01/2011  Procedure: COLONOSCOPY;  Surgeon: Lear Ng, MD;  [facility];  Service: Endoscopy;  Laterality: N/A; . COLONOSCOPY  10/18/2012  Procedure: COLONOSCOPY;  Surgeon: Lear Ng, MD;  [facility];  Service: Endoscopy;  Laterality: N/A;  colonic dilation . ESOPHAGOGASTRODUODENOSCOPY  12/01/2011  Procedure: ESOPHAGOGASTRODUODENOSCOPY (EGD);  Surgeon: Lear Ng, MD;  Location: [facility];  Service: Endoscopy;  Laterality: N/A; . HOT HEMOSTASIS  12/01/2011  Procedure: HOT HEMOSTASIS (ARGON PLASMA COAGULATION/BICAP);  Surgeon: Lear Ng, MD;  Location: [facility];  Service: Endoscopy;  Laterality: N/A; . 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  PMH + for cva, Barrett's esophagus, HTN, anxiety, depression.  PSH includes CEA.  Pt also with lordosis and spurring at C5-C6, C6-C7.  She has been having difficulty swallowing and speech/swallow eval ordered.  From chart review, RN report of deficits and suspected pna -  MBS indicated.    Subjective: pt awake in chair Visit Diagnosis: No diagnosis found. Assessment / Plan / Recommendation CHL IP CLINICAL IMPRESSIONS 08/13/2016 Therapy Diagnosis Mild oral phase dysphagia;Mild pharyngeal phase dysphagia Clinical Impression Mild oropharyngeal dysphagia without aspiration or deep penetration of any consistency tested. Decreased oral coordination resulted in premature spillage of barium into pharynx = poorly controlled. Pharyngeal swallow was delayed to pyriform sinus with thin liquid via cup allowing trace upper laryngeal penetration.  Cued cough cleared TRACE penetrates.  Pharyngeal swallow was strong without residuals pt complained of sensation of residuals at distal esophagus, could not view during testing due to pt's stature.  Suspect given pt's h/o Barrett's and reflux, her primary aspiration risk if from known esophageal deficits.  Recommend dys3/thin due to pt's oral deficits/poor dentition.  Of note, pt was fidgety during entire test.   Impact on safety and function Mild aspiration risk   CHL IP TREATMENT RECOMMENDATION 08/13/2016 Treatment Recommendations Therapy as outlined in treatment plan below   Prognosis 08/13/2016 Prognosis for Safe Diet Advancement Fair Barriers to Reach Goals Cognitive deficits Barriers/Prognosis Comment -- CHL IP DIET RECOMMENDATION 08/13/2016 SLP Diet Recommendations Dysphagia 3 (Mech soft) solids;Thin liquid Liquid Administration via Cup;Straw Medication Administration Whole meds with puree Compensations Slow rate;Small sips/bites;Clear throat intermittently Postural Changes Remain semi-upright after after feeds/meals (Comment);Seated upright at 90 degrees   CHL IP OTHER RECOMMENDATIONS 08/13/2016 Recommended Consults Consider esophageal assessment Oral Care Recommendations Oral care BID Other Recommendations --   No flowsheet data found.  CHL IP FREQUENCY AND DURATION 08/13/2016 Speech Therapy Frequency (ACUTE ONLY) min 1 x/week Treatment Duration 1 week      CHL IP ORAL  PHASE 08/13/2016 Oral Phase Impaired Oral - Pudding Teaspoon -- Oral -  Pudding Cup -- Oral - Honey Teaspoon -- Oral - Honey Cup -- Oral - Nectar Teaspoon -- Oral - Nectar Cup Weak lingual manipulation;Premature spillage Oral - Nectar Straw Weak lingual manipulation;Premature spillage Oral - Thin Teaspoon Weak lingual manipulation;Premature spillage Oral - Thin Cup Weak lingual manipulation;Premature spillage Oral - Thin Straw -- Oral - Puree Weak lingual manipulation;Premature spillage Oral - Mech Soft -- Oral - Regular Weak lingual manipulation;Premature spillage Oral - Multi-Consistency -- Oral - Pill -- Oral Phase - Comment --  CHL IP PHARYNGEAL PHASE 08/13/2016 Pharyngeal Phase Impaired Pharyngeal- Pudding Teaspoon -- Pharyngeal -- Pharyngeal- Pudding Cup -- Pharyngeal -- Pharyngeal- Honey Teaspoon -- Pharyngeal -- Pharyngeal- Honey Cup -- Pharyngeal -- Pharyngeal- Nectar Teaspoon Delayed swallow initiation-vallecula Pharyngeal -- Pharyngeal- Nectar Cup Delayed swallow initiation-vallecula Pharyngeal -- Pharyngeal- Nectar Straw Delayed swallow initiation-vallecula Pharyngeal -- Pharyngeal- Thin Teaspoon Delayed swallow initiation-vallecula;Delayed swallow initiation-pyriform sinuses Pharyngeal -- Pharyngeal- Thin Cup Delayed swallow initiation-vallecula;Delayed swallow initiation-pyriform sinuses Trace penetration of thin, cleared with cued throat clear Pharyngeal -- Pharyngeal- Thin Straw -- Pharyngeal -- Pharyngeal- Puree Delayed swallow initiation-vallecula Pharyngeal -- Pharyngeal- Mechanical Soft -- Pharyngeal -- Pharyngeal- Regular -- Pharyngeal -- Pharyngeal- Multi-consistency -- Pharyngeal -- Pharyngeal- Pill -- Pharyngeal -- Pharyngeal Comment --  CHL IP CERVICAL ESOPHAGEAL PHASE 08/13/2016 Cervical Esophageal Phase WFL Pudding Teaspoon -- Pudding Cup -- Honey Teaspoon -- Honey Cup -- Nectar Teaspoon -- Nectar Cup -- Nectar Straw -- Thin Teaspoon -- Thin Cup -- Thin Straw -- Puree -- Mechanical Soft --  Regular -- Multi-consistency -- Pill -- Cervical Esophageal Comment -- No flowsheet data found. Luanna Salk, MS [facility] SLP [phone]               Assessment and Plan  CONFUSION/ENCOUNTER FOR FAMILY CONFERENCE -I did find out later from another source that daughter c/o that pt's confusion began 2 weeks before she went to the hospital. I made plans with daughter to get pt off any med that could impact on mental status; d/c baclofen, d/c klonopin, taper of prednisone; have ordered CBC, CMP, TSH, U/A. I made plan s to speak with the daughter in 2 days after labs have returned .    Time spent . 35 min;> 50% of time with patient was spent reviewing records, labs, tests and studies, counseling and developing plan of care  Webb Silversmith D. [name], MD

## 2016-08-25 ENCOUNTER — Non-Acute Institutional Stay (SKILLED_NURSING_FACILITY): Payer: Medicare Other | Admitting: Internal Medicine

## 2016-08-25 ENCOUNTER — Encounter: Payer: Self-pay | Admitting: Internal Medicine

## 2016-08-25 DIAGNOSIS — I959 Hypotension, unspecified: Secondary | ICD-10-CM | POA: Diagnosis not present

## 2016-08-25 DIAGNOSIS — R404 Transient alteration of awareness: Secondary | ICD-10-CM

## 2016-08-25 DIAGNOSIS — R4189 Other symptoms and signs involving cognitive functions and awareness: Secondary | ICD-10-CM | POA: Diagnosis not present

## 2016-08-25 LAB — HEPATIC FUNCTION PANEL
ALK PHOS: 61 U/L (ref 25–125)
ALT: 25 U/L (ref 7–35)
AST: 21 U/L (ref 13–35)
Bilirubin, Total: 1.5 mg/dL

## 2016-08-25 LAB — TSH: TSH: 6.29 u[IU]/mL — AB (ref 0.41–5.90)

## 2016-08-25 LAB — BASIC METABOLIC PANEL
BUN: 21 mg/dL (ref 4–21)
Creatinine: 1 mg/dL (ref 0.5–1.1)
GLUCOSE: 85 mg/dL
Potassium: 4.3 mmol/L (ref 3.4–5.3)
SODIUM: 135 mmol/L — AB (ref 137–147)

## 2016-08-25 LAB — CBC AND DIFFERENTIAL
HCT: 37 % (ref 36–46)
Hemoglobin: 11.8 g/dL — AB (ref 12.0–16.0)
PLATELETS: 319 10*3/uL (ref 150–399)
WBC: 12.1 10^3/mL

## 2016-08-25 NOTE — Progress Notes (Signed)
Location:  [place] Room Number: 106 P Place of Service:  SNF (31)  Cindy [name]. Cindy Coil, MD  Patient Care Team: [name], MD as PCP - [facility])  Extended Emergency Contact Information Primary Emergency Contact: Tyner,[name] Address: [address] [place], [place] [address] [name] of [place] Phone: [phone] Work Phone: [phone] Relation: Daughter Secondary Emergency Contact: Drue Novel [place] of [place] Phone: [phone] Relation: Niece    Allergies: Review of patient's allergies indicates no known allergies.  Chief Complaint  Patient presents with  . Acute Visit    Acute    HPI: Patient is 75 y.o. female who is being seen acutely for being found unresponsive in her WC. Her body was reported as cool. Pt has been laid on the bed and is gradually becoming more responsive. No loss of bowel or bladder. BP is low/palpable and BS 130, O2 sat on 2L is 97%. Pt has been refusing food and liquids, being fed but refusing to swallow and spitting out her food.   Past Medical History:  Diagnosis Date  . Angina pectoris ([place])   . Anxiety   . Asthma   . Barrett's esophagus   . Bronchitis   . 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 Chest pain   . Coronary artery disease    right carotid occlusion   . Depression   . Dysrhythmia    atrial fibrillation  . GERD (gastroesophageal reflux disease)   . Hyperlipidemia   . Hypertension   . Recurrent upper respiratory infection (URI)   . Stroke [facility])    2007, after left endarterectomy    Past Surgical History:  Procedure Laterality Date  . ABDOMINAL HYSTERECTOMY    . BALLOON DILATION  10/18/2012   Procedure: BALLOON DILATION;  Surgeon: Lear Ng, MD;  Location: [facility] ENDOSCOPY;  Service: Endoscopy;  Laterality: N/A;  . CARDIAC CATHETERIZATION    . CAROTID ENDARTERECTOMY     left, complicated by stroke  . CHOLECYSTECTOMY     incidental at time  of colectomy  . COLONOSCOPY  12/01/2011   Procedure: COLONOSCOPY;  Surgeon: Lear Ng, MD;  [facility]: [facility];  Service: Endoscopy;  Laterality: N/A;  . COLONOSCOPY  10/18/2012   Procedure: COLONOSCOPY;  Surgeon: Lear Ng, MD;  [facility];  Service: Endoscopy;  Laterality: N/A;  colonic dilation  . ESOPHAGOGASTRODUODENOSCOPY  12/01/2011   Procedure: ESOPHAGOGASTRODUODENOSCOPY (EGD);  Surgeon: Lear Ng, MD;  Location: [facility];  Service: Endoscopy;  Laterality: N/A;  . HOT HEMOSTASIS  12/01/2011   Procedure: HOT HEMOSTASIS (ARGON PLASMA COAGULATION/BICAP);  Surgeon: Lear Ng, MD;  Location: [facility];  Service: Endoscopy;  Laterality: N/A;  . 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 Always use your most recent med list.          acetaminophen 325 MG tablet Commonly known as:  TYLENOL Take 2 tablets (650 mg total) by mouth every 6 (six) hours as needed for mild pain (or Fever >/= 101).   albuterol 108 (90 Base) MCG/ACT inhaler Commonly known as:  PROVENTIL HFA;VENTOLIN HFA Inhale 2 puffs into the lungs every 6 (six) hours as needed for shortness of breath.   alendronate 70 MG tablet Commonly known as:  FOSAMAX Take 70 mg by mouth every Monday. Take with a full glass of water on an empty stomach.   aspirin EC 81 MG tablet Take 81 mg by mouth daily.   baclofen 10 MG tablet Commonly known as:  LIORESAL Take 10 mg by mouth 2 (two) times daily as needed for muscle spasms.   BAZA EX Apply 1 application topically 2 (two) times daily as needed (affected areas on buttocks/sacral.).   budesonide-formoterol 160-4.5 MCG/ACT inhaler Commonly known as:  SYMBICORT Inhale 2 puffs into the lungs 2 (two) times daily.   carvedilol 3.125 MG tablet Commonly known as:  COREG Take 1 tablet (3.125 mg total) by mouth 2 (two) times  daily with a meal.   clonazePAM 0.5 MG tablet Commonly known as:  KLONOPIN Take 0.5 tablets (0.25 mg total) by mouth 2 (two) times daily as needed for anxiety.   ENSURE Take 237 mLs by mouth 3 (three) times daily between meals.       No orders of the defined types were placed in this encounter.   Immunization History  Administered Date(s) Administered  . Pneumococcal Polysaccharide-23 01/08/2012    Social History  Substance Use Topics  . Smoking status: Former Smoker    Quit date: 04/09/2003  . Smokeless tobacco: Never Used  . Alcohol use No    Review of Systems  UTO 2/2 condition; nursing per HPI    Vitals:   08/25/16 1350  BP: (!) 68/40  Pulse: 77  Resp: 16  Temp: (!) 96.7 F (35.9 C)   Body mass index is 27.02 kg/m. Physical Exam  GENERAL APPEARANCE: somnalent  No acute distress  SKIN: No diaphoresis rash; very cool to touch HEENT: Unremarkable RESPIRATORY: Breathing is even, unlabored. Lung sounds are clear   CARDIOVASCULAR: Heart RRR no murmurs, rubs or gallops. No peripheral edema  GASTROINTESTINAL: Abdomen is soft, non-tender, not distended w/ normal bowel sounds.  GENITOURINARY: Bladder non tender, not distended  MUSCULOSKELETAL: No abnormal joints or musculature NEUROLOGIC: Cranial nerves 2-12 grossly intact. Moves all extremities PSYCHIATRIC: n/a  Patient Active Problem List   Diagnosis Date Noted  . Asthma with acute exacerbation 08/21/2016  . Atrial fibrillation with RVR ([place]) 08/21/2016  . Elevated INR 08/21/2016  . Acute on chronic systolic CHF (congestive heart failure) ([place]) 08/21/2016  . Hyperlipidemia 08/21/2016  . Electrolyte abnormality 08/21/2016  . HCAP (healthcare-associated pneumonia) 08/10/2016  . Sepsis ([place]) 08/09/2016  . Coronary atherosclerosis of native coronary artery 01/21/2014  . Obesity 01/17/2014  . Abdominal pain, generalized 10/18/2012  . GERD (gastroesophageal reflux disease)   . Depression   . Anxiety   .  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[place])   . Stroke ([place])   . Chest pain 01/07/2012  . Asthma 01/07/2012  . A-fib ([place]) 01/07/2012  . CVA (cerebral infarction) 01/07/2012  . Anemia 01/07/2012  . ANEMIA 07/29/2010  . BARRETTS ESOPHAGUS 06/04/2010    CMP     Component Value Date/Time   NA 134 (L) 08/12/2016 0527   NA 134 (A) 08/12/2016   K 4.7 08/12/2016 0527   CL 101 08/12/2016 0527   CO2 26 08/12/2016 0527   GLUCOSE 123 (H) 08/12/2016 0527   BUN 29 (H) 08/12/2016 0527   BUN 29 (A) 08/12/2016   CREATININE 0.88 08/12/2016 0527   CALCIUM 10.0 08/12/2016 0527   PROT 5.9 (L) 08/12/2016 0527   ALBUMIN 2.7 (L) 08/12/2016 0527   AST 110 (H) 08/12/2016 0527   ALT 127 (H) 08/12/2016  0527   ALKPHOS 88 08/12/2016 0527   BILITOT 1.0 08/12/2016 0527   GFRNONAA >60 08/12/2016 0527   GFRAA >60 08/12/2016 0527    Recent Labs  08/09/16 2029 08/10/16 08/10/16 0613 08/12/16 08/12/16 0527  NA 132* 132* 132* 134* 134*  K 3.3* 4.0 4.0  --  4.7  CL 98*  --  100*  --  101  CO2 24  --  21*  --  26  GLUCOSE 96  --  150*  --  123*  BUN 23* 23* 23* 29* 29*  CREATININE 0.96 0.8 0.83 0.9 0.88  CALCIUM 10.1  --  9.9  --  10.0    Recent Labs  08/09/16 08/09/16 2029 08/12/16 0527  AST 100* 100* 110*  ALT 80* 80* 127*  ALKPHOS 93 93 88  BILITOT  --  1.2 1.0  PROT  --  6.6 5.9*  ALBUMIN  --  3.1* 2.7*    Recent Labs  08/09/16 2029  08/10/16 0613 08/12/16 08/12/16 0527 08/15/16 1053  WBC 15.8*  < > 17.5* 23.0 23.0* 16.9*  NEUTROABS 14.1*  --   --   --  20.5*  --   HGB 10.6*  < > 10.9* 9.5* 9.5* 9.9*  HCT 32.3*  < > 33.0* 29* 29.1* 30.7*  MCV 81.2  --  81.5  --  82.0 83.0  PLT 270  < > 262 337 337 397  < > = values in this interval not displayed. No results for input(s): CHOL, LDLCALC, TRIG in the last 8760 hours.  Invalid input(s): HCL No results found for: MICROALBUR No results found for: TSH No results found for: HGBA1C No results found for:  CHOL, HDL, LDLCALC, LDLDIRECT, TRIG, CHOLHDL  Significant Diagnostic Results in last 30 days:  Dg Chest 2 View  Result Date: 08/09/2016 CLINICAL DATA:  Cough, shortness of breath and fever. EXAM: CHEST  2 VIEW COMPARISON:  04/26/2016. FINDINGS: Stable enlarged cardiac silhouette. Interval patchy opacity in the left lower lobe. Diffuse peribronchial thickening and accentuation of the interstitial markings with progression. Interval minimal patchy opacity at the right lung base. Small left pleural effusion. IMPRESSION: 1. Left lower lobe pneumonia with a small left pleural effusion. 2. Minimal patchy atelectasis or pneumonia at the right lung base. 3. Moderate diffuse bronchitic changes. 4. Stable cardiomegaly. Electronically Signed   By: Claudie Revering M.D.   On: 08/09/2016 20:46   Dg Swallowing Func-speech Pathology  Result Date: 08/13/2016 Objective Swallowing Evaluation: Type of Study: Bedside Swallow Evaluation Patient Details Name: LAQUISHA MASTRANDREA MRN: [id number] Date of Birth: 1941/10/20 Today's Date: 08/13/2016 Time: SLP Start Time (ACUTE ONLY): 1226-SLP Stop Time (ACUTE ONLY): 1245 SLP Time Calculation (min) (ACUTE ONLY): 19 min Past Medical History: Past Medical History: Diagnosis Date . Angina pectoris ([place])  . Anxiety  . Asthma  . Barrett's esophagus  . Bronchitis  . Cancer ([place])   uterian . Chest pain  . Coronary artery disease   right carotid occlusion  . Depression  . Dysrhythmia   atrial fibrillation . GERD (gastroesophageal reflux disease)  . Hyperlipidemia  . Hypertension  . Recurrent upper respiratory infection (URI)  . Stroke [facility])   2007, after left endarterectomy Past Surgical History: Past Surgical History: Procedure Laterality Date . ABDOMINAL HYSTERECTOMY   . BALLOON DILATION  10/18/2012  Procedure: BALLOON DILATION;  Surgeon: Lear Ng, MD;  [facility];  Service: Endoscopy;  Laterality: N/A; . CARDIAC CATHETERIZATION   . CAROTID ENDARTERECTOMY    left,  complicated by  stroke . CHOLECYSTECTOMY    incidental at time of colectomy . COLONOSCOPY  12/01/2011  Procedure: COLONOSCOPY;  Surgeon: Lear Ng, MD;  [facility]: [facility];  Service: Endoscopy;  Laterality: N/A; . COLONOSCOPY  10/18/2012  Procedure: COLONOSCOPY;  Surgeon: Lear Ng, MD;  [facility];  Service: Endoscopy;  Laterality: N/A;  colonic dilation . ESOPHAGOGASTRODUODENOSCOPY  12/01/2011  Procedure: ESOPHAGOGASTRODUODENOSCOPY (EGD);  Surgeon: Lear Ng, MD;  Location: [facility];  Service: Endoscopy;  Laterality: N/A; . HOT HEMOSTASIS  12/01/2011  Procedure: HOT HEMOSTASIS (ARGON PLASMA COAGULATION/BICAP);  Surgeon: Lear Ng, MD;  Location: [facility];  Service: Endoscopy;  Laterality: N/A; . 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  PMH + for cva, Barrett's esophagus, HTN, anxiety, depression.  PSH includes CEA.  Pt also with lordosis and spurring at C5-C6, C6-C7.  She has been having difficulty swallowing and speech/swallow eval ordered.  From chart review, RN report of deficits and suspected pna -  MBS indicated.   Subjective: pt awake in chair Visit Diagnosis: No diagnosis found. Assessment / Plan / Recommendation CHL IP CLINICAL IMPRESSIONS 08/13/2016 Therapy Diagnosis Mild oral phase dysphagia;Mild pharyngeal phase dysphagia Clinical Impression Mild oropharyngeal dysphagia without aspiration or deep penetration of any consistency tested. Decreased oral coordination resulted in premature spillage of barium into pharynx = poorly controlled. Pharyngeal swallow was delayed to pyriform sinus with thin liquid via cup allowing trace upper laryngeal penetration.  Cued cough cleared TRACE penetrates.  Pharyngeal swallow was strong without residuals pt complained of sensation of residuals at distal esophagus, could not view during testing due to pt's stature.  Suspect given pt's  h/o Barrett's and reflux, her primary aspiration risk if from known esophageal deficits.  Recommend dys3/thin due to pt's oral deficits/poor dentition.  Of note, pt was fidgety during entire test.   Impact on safety and function Mild aspiration risk   CHL IP TREATMENT RECOMMENDATION 08/13/2016 Treatment Recommendations Therapy as outlined in treatment plan below   Prognosis 08/13/2016 Prognosis for Safe Diet Advancement Fair Barriers to Reach Goals Cognitive deficits Barriers/Prognosis Comment -- CHL IP DIET RECOMMENDATION 08/13/2016 SLP Diet Recommendations Dysphagia 3 (Mech soft) solids;Thin liquid Liquid Administration via Cup;Straw Medication Administration Whole meds with puree Compensations Slow rate;Small sips/bites;Clear throat intermittently Postural Changes Remain semi-upright after after feeds/meals (Comment);Seated upright at 90 degrees   CHL IP OTHER RECOMMENDATIONS 08/13/2016 Recommended Consults Consider esophageal assessment Oral Care Recommendations Oral care BID Other Recommendations --   No flowsheet data found.  CHL IP FREQUENCY AND DURATION 08/13/2016 Speech Therapy Frequency (ACUTE ONLY) min 1 x/week Treatment Duration 1 week      CHL IP ORAL PHASE 08/13/2016 Oral Phase Impaired Oral - Pudding Teaspoon -- Oral - Pudding Cup -- Oral - Honey Teaspoon -- Oral - Honey Cup -- Oral - Nectar Teaspoon -- Oral - Nectar Cup Weak lingual manipulation;Premature spillage Oral - Nectar Straw Weak lingual manipulation;Premature spillage Oral - Thin Teaspoon Weak lingual manipulation;Premature spillage Oral - Thin Cup Weak lingual manipulation;Premature spillage Oral - Thin Straw -- Oral - Puree Weak lingual manipulation;Premature spillage Oral - Mech Soft -- Oral - Regular Weak lingual manipulation;Premature spillage Oral - Multi-Consistency -- Oral - Pill -- Oral Phase - Comment --  CHL IP PHARYNGEAL PHASE 08/13/2016 Pharyngeal Phase Impaired Pharyngeal- Pudding Teaspoon -- Pharyngeal -- Pharyngeal- Pudding Cup --  Pharyngeal -- Pharyngeal- Honey Teaspoon --  Pharyngeal -- Pharyngeal- Honey Cup -- Pharyngeal -- Pharyngeal- Nectar Teaspoon Delayed swallow initiation-vallecula Pharyngeal -- Pharyngeal- Nectar Cup Delayed swallow initiation-vallecula Pharyngeal -- Pharyngeal- Nectar Straw Delayed swallow initiation-vallecula Pharyngeal -- Pharyngeal- Thin Teaspoon Delayed swallow initiation-vallecula;Delayed swallow initiation-pyriform sinuses Pharyngeal -- Pharyngeal- Thin Cup Delayed swallow initiation-vallecula;Delayed swallow initiation-pyriform sinuses Trace penetration of thin, cleared with cued throat clear Pharyngeal -- Pharyngeal- Thin Straw -- Pharyngeal -- Pharyngeal- Puree Delayed swallow initiation-vallecula Pharyngeal -- Pharyngeal- Mechanical Soft -- Pharyngeal -- Pharyngeal- Regular -- Pharyngeal -- Pharyngeal- Multi-consistency -- Pharyngeal -- Pharyngeal- Pill -- Pharyngeal -- Pharyngeal Comment --  CHL IP CERVICAL ESOPHAGEAL PHASE 08/13/2016 Cervical Esophageal Phase WFL Pudding Teaspoon -- Pudding Cup -- Honey Teaspoon -- Honey Cup -- Nectar Teaspoon -- Nectar Cup -- Nectar Straw -- Thin Teaspoon -- Thin Cup -- Thin Straw -- Puree -- Mechanical Soft -- Regular -- Multi-consistency -- Pill -- Cervical Esophageal Comment -- No flowsheet data found. Luanna Salk, [place] [facility] SLP [phone]               Assessment and Plan  UNRESPONSIVE/HYPOTENSIVE - for some time based on how cool she is; multiple possibilities, none of which can be worked up at [facility] ; she is stablizing, EMS has been called    Time spent > 35 min at bedside Webb [place] D. Cindy Coil, MD

## 2016-08-27 LAB — PROTIME-INR: PROTIME: 18.1 s — AB (ref 10.0–13.8)

## 2016-08-27 LAB — CBC AND DIFFERENTIAL
HEMATOCRIT: 32 % — AB (ref 36–46)
HEMOGLOBIN: 10.2 g/dL — AB (ref 12.0–16.0)
PLATELETS: 190 10*3/uL (ref 150–399)
WBC: 5.6 10*3/mL

## 2016-08-27 LAB — BASIC METABOLIC PANEL
BUN: 13 mg/dL (ref 4–21)
Creatinine: 0.8 mg/dL (ref 0.5–1.1)
Potassium: 3.9 mmol/L (ref 3.4–5.3)
SODIUM: 143 mmol/L (ref 137–147)

## 2016-08-27 LAB — POCT INR: INR: 1.5 — AB (ref 0.9–1.1)

## 2016-08-30 ENCOUNTER — Encounter: Payer: Self-pay | Admitting: Internal Medicine

## 2016-08-30 ENCOUNTER — Non-Acute Institutional Stay (SKILLED_NURSING_FACILITY): Payer: Medicare Other | Admitting: Internal Medicine

## 2016-08-30 DIAGNOSIS — I4891 Unspecified atrial fibrillation: Secondary | ICD-10-CM | POA: Diagnosis not present

## 2016-08-30 DIAGNOSIS — F0280 Dementia in other diseases classified elsewhere without behavioral disturbance: Secondary | ICD-10-CM

## 2016-08-30 DIAGNOSIS — I6521 Occlusion and stenosis of right carotid artery: Secondary | ICD-10-CM | POA: Diagnosis not present

## 2016-08-30 DIAGNOSIS — R4020 Unspecified coma: Secondary | ICD-10-CM

## 2016-08-30 DIAGNOSIS — I110 Hypertensive heart disease with heart failure: Secondary | ICD-10-CM | POA: Diagnosis not present

## 2016-08-30 DIAGNOSIS — J189 Pneumonia, unspecified organism: Secondary | ICD-10-CM | POA: Diagnosis not present

## 2016-08-30 DIAGNOSIS — E782 Mixed hyperlipidemia: Secondary | ICD-10-CM | POA: Diagnosis not present

## 2016-08-30 NOTE — Progress Notes (Signed)
: Provider:  Noah Delaine. [name], MD Location:  [place] Room Number: 106 P Place of Service:  SNF (31)  PCP: [name], MD Patient Care Team: [name], MD as PCP - General (Family Medicine)  Extended Emergency Contact Information Primary Emergency Contact: Tyner,[name] Address: [address] [place], [place] [address] [name] of [place] Phone: [phone] Work Phone: [phone] Relation: Daughter Secondary Emergency Contact: Drue [place] of [place] Phone: [phone] Relation: Niece     Allergies: Review of patient's allergies indicates no known allergies.  Chief Complaint  Patient presents with  . New Admit To SNF    Admit to Facility    HPI: Patient is 75 y.o. female who who was admitted to SNF several weeks prior after being hospitalized for sepsis and PNA who was admitted to [facility] from 9/27-29 after being found unresponsive, cool and hypotensive. Syncopal work-up was negative.Pt is admitted back to SNF to finish her rehab from her PNA and sepsis. While at SNF pt will be followed for HTN, tx with Imdur,losartan and coreg, AF, tx with coreg, lovenox and warfarin and HLD, tx with zocor.  Past Medical History:  Diagnosis Date  . Angina pectoris ([place])   . Anxiety   . Asthma   . Barrett's esophagus   . Bronchitis   . 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 Chest pain   . Coronary artery disease    right carotid occlusion   . Depression   . Dysrhythmia    atrial fibrillation  . GERD (gastroesophageal reflux disease)   . Hyperlipidemia   . Hypertension   . Recurrent upper respiratory infection (URI)   . Stroke [facility])    2007, after left endarterectomy    Past Surgical History:  Procedure Laterality Date  . ABDOMINAL HYSTERECTOMY    . BALLOON DILATION  10/18/2012   Procedure: BALLOON DILATION;  Surgeon: Lear Ng, MD;  Location: [facility] ENDOSCOPY;  Service: Endoscopy;  Laterality: N/A;  .  CARDIAC CATHETERIZATION    . CAROTID ENDARTERECTOMY     left, complicated by stroke  . CHOLECYSTECTOMY     incidental at time of colectomy  . COLONOSCOPY  12/01/2011   Procedure: COLONOSCOPY;  Surgeon: Lear Ng, MD;  Location: [facility] ENDOSCOPY;  Service: Endoscopy;  Laterality: N/A;  . COLONOSCOPY  10/18/2012   Procedure: COLONOSCOPY;  Surgeon: Lear Ng, MD;  Location: [facility] ENDOSCOPY;  Service: Endoscopy;  Laterality: N/A;  colonic dilation  . ESOPHAGOGASTRODUODENOSCOPY  12/01/2011   Procedure: ESOPHAGOGASTRODUODENOSCOPY (EGD);  Surgeon: Lear Ng, MD;  Location: [facility];  Service: Endoscopy;  Laterality: N/A;  . HOT HEMOSTASIS  12/01/2011   Procedure: HOT HEMOSTASIS (ARGON PLASMA COAGULATION/BICAP);  Surgeon: Lear Ng, MD;  Location: [facility];  Service: Endoscopy;  Laterality: N/A;  . 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 Always use your most recent med list.          acetaminophen 325 MG tablet Commonly known as:  TYLENOL Take 2 tablets (650 mg total) by mouth every 6 (six) hours as needed for mild pain (or Fever >/= 101).   albuterol 108 (90 Base) MCG/ACT inhaler Commonly known as:  PROVENTIL HFA;VENTOLIN HFA Inhale 2 puffs into the lungs every 6 (six)  hours as needed for shortness of breath.   alendronate 70 MG tablet Commonly known as:  FOSAMAX Take 70 mg by mouth every Monday. Take with a full glass of water on an empty stomach.   aspirin EC 81 MG tablet Take 81 mg by mouth daily.   baclofen 10 MG tablet Commonly known as:  LIORESAL Take 10 mg by mouth 2 (two) times daily as needed for muscle spasms.   BAZA EX Apply 1 application topically 2 (two) times daily as needed (affected areas on buttocks/sacral.).   budesonide-formoterol 160-4.5 MCG/ACT inhaler Commonly known as:  SYMBICORT Inhale 2 puffs into the lungs 2 (two)  times daily.   carvedilol 3.125 MG tablet Commonly known as:  COREG Take 1 tablet (3.125 mg total) by mouth 2 (two) times daily with a meal.   clonazePAM 0.5 MG tablet Commonly known as:  KLONOPIN Take 0.5 tablets (0.25 mg total) by mouth 2 (two) times daily as needed for anxiety.   doxycycline 100 MG tablet Commonly known as:  VIBRA-TABS Take 100 mg by mouth 2 (two) times daily.   enoxaparin 80 MG/0.8ML injection Commonly known as:  LOVENOX Inject 70 mg into the skin 2 (two) times daily.   ENSURE Take 237 mLs by mouth 3 (three) times daily between meals.   ferrous sulfate 325 (65 FE) MG tablet Take 325 mg by mouth daily with breakfast.   fluticasone 50 MCG/ACT nasal spray Commonly known as:  FLONASE Place 1 spray into both nostrils daily.   guaifenesin 100 MG/5ML syrup Commonly known as:  ROBITUSSIN Take 200 mg by mouth every 6 (six) hours as needed for cough.   ipratropium-albuterol 0.5-2.5 (3) MG/3ML Soln Commonly known as:  DUONEB Take 3 mLs by nebulization every 4 (four) hours as needed.   isosorbide mononitrate 30 MG 24 hr tablet Commonly known as:  IMDUR Take 30 mg by mouth daily.   loratadine 10 MG tablet Commonly known as:  CLARITIN Take 10 mg by mouth daily.   losartan 50 MG tablet Commonly known as:  COZAAR Take 50 mg by mouth every evening.   Melatonin 3 MG Tabs Take 1 tablet by mouth at bedtime.   nitroGLYCERIN 0.4 MG SL tablet Commonly known as:  NITROSTAT Place 0.4 mg under the tongue every 5 (five) minutes as needed for chest pain.   omeprazole 40 MG capsule Commonly known as:  PRILOSEC Take 40 mg by mouth daily.   polyethylene glycol packet Commonly known as:  MIRALAX / GLYCOLAX Take 17 g by mouth daily as needed.   simvastatin 10 MG tablet Commonly known as:  ZOCOR Take 10 mg by mouth at bedtime.   sodium chloride 0.65 % nasal spray Commonly known as:  OCEAN Place 1 spray into the nose every 4 (four) hours as needed for  congestion. Into both nares   tiotropium 18 MCG inhalation capsule Commonly known as:  SPIRIVA Place 18 mcg into inhaler and inhale daily.   traZODone 150 MG tablet Commonly known as:  DESYREL Take 50 mg by mouth at bedtime.   UNABLE TO FIND Mineral oil extra heavy : Administer 1 drop to the right ear every Monday.   vitamin B-12 500 MCG tablet Commonly known as:  CYANOCOBALAMIN Take 500 mcg by mouth daily.   Vitamin D3 1000 units Caps Take 1 capsule by mouth daily.   warfarin 2.5 MG tablet Commonly known as:  COUMADIN Take 2.5 mg by mouth daily with supper.       Meds  ordered this encounter  Medications  . doxycycline (VIBRA-TABS) 100 MG tablet    Sig: Take 100 mg by mouth 2 (two) times daily.  . ferrous sulfate 325 (65 FE) MG tablet    Sig: Take 325 mg by mouth daily with breakfast.  . fluticasone (FLONASE) 50 MCG/ACT nasal spray    Sig: Place 1 spray into both nostrils daily.  Marland Kitchen guaifenesin (ROBITUSSIN) 100 MG/5ML syrup    Sig: Take 200 mg by mouth every 6 (six) hours as needed for cough.  Marland Kitchen ipratropium-albuterol (DUONEB) 0.5-2.5 (3) MG/3ML SOLN    Sig: Take 3 mLs by nebulization every 4 (four) hours as needed.  . isosorbide mononitrate (IMDUR) 30 MG 24 hr tablet    Sig: Take 30 mg by mouth daily.  Marland Kitchen loratadine (CLARITIN) 10 MG tablet    Sig: Take 10 mg by mouth daily.  Marland Kitchen losartan (COZAAR) 50 MG tablet    Sig: Take 50 mg by mouth every evening.  . enoxaparin (LOVENOX) 80 MG/0.8ML injection    Sig: Inject 70 mg into the skin 2 (two) times daily.  . Melatonin 3 MG TABS    Sig: Take 1 tablet by mouth at bedtime.  Marland Kitchen UNABLE TO FIND    Sig: Mineral oil extra heavy : Administer 1 drop to the right ear every Monday.  . nitroGLYCERIN (NITROSTAT) 0.4 MG SL tablet    Sig: Place 0.4 mg under the tongue every 5 (five) minutes as needed for chest pain.  Marland Kitchen omeprazole (PRILOSEC) 40 MG capsule    Sig: Take 40 mg by mouth daily.  . polyethylene glycol (MIRALAX / GLYCOLAX)  packet    Sig: Take 17 g by mouth daily as needed.  . simvastatin (ZOCOR) 10 MG tablet    Sig: Take 10 mg by mouth at bedtime.  . sodium chloride (OCEAN) 0.65 % nasal spray    Sig: Place 1 spray into the nose every 4 (four) hours as needed for congestion. Into both nares  . tiotropium (SPIRIVA) 18 MCG inhalation capsule    Sig: Place 18 mcg into inhaler and inhale daily.  . traZODone (DESYREL) 150 MG tablet    Sig: Take 50 mg by mouth at bedtime.  . vitamin B-12 (CYANOCOBALAMIN) 500 MCG tablet    Sig: Take 500 mcg by mouth daily.  . Cholecalciferol (VITAMIN D3) 1000 units CAPS    Sig: Take 1 capsule by mouth daily.  Marland Kitchen warfarin (COUMADIN) 2.5 MG tablet    Sig: Take 2.5 mg by mouth daily with supper.    Immunization History  Administered Date(s) Administered  . Pneumococcal Polysaccharide-23 01/08/2012    Social History  Substance Use Topics  . Smoking status: Former Smoker    Quit date: 04/09/2003  . Smokeless tobacco: Never Used  . Alcohol use No    Family history is   Family History  Problem Relation Age of Onset  . Heart disease Mother   . Cancer Mother   . Heart attack Father   . Cancer Sister     ovarian      Review of Systems  DATA OBTAINED: from patient- can not fully participate; nurse GENERAL:  no fevers, fatigue, appetite changes SKIN: No itching, or rash EYES: No eye pain, redness, discharge EARS: No earache, tinnitus, change in hearing NOSE: No congestion, drainage or bleeding  MOUTH/THROAT: No mouth or tooth pain, No sore throat RESPIRATORY: No cough, wheezing, SOB CARDIAC: No chest pain, palpitations, lower extremity edema  GI: No abdominal pain, No  N/V/D or constipation, No heartburn or reflux  GU: No dysuria, frequency or urgency, or incontinence  MUSCULOSKELETAL: No unrelieved bone/joint pain NEUROLOGIC: No headache, dizziness or focal weakness PSYCHIATRIC: No c/o anxiety or sadness   Vitals:   08/30/16 0946  BP: 97/66  Pulse: 86  Resp:  18  Temp: 98.1 F (36.7 C)    SpO2 Readings from Last 1 Encounters:  08/16/16 97%   Body mass index is 26.57 kg/m.     Physical Exam  GENERAL APPEARANCE: Alert, conversant,  No acute distress.  SKIN: No diaphoresis rash HEAD: Normocephalic, atraumatic  EYES: Conjunctiva/lids clear. Pupils round, reactive. EOMs intact.  EARS: External exam WNL, canals clear. Hearing grossly normal.  NOSE: No deformity or discharge.  MOUTH/THROAT: Lips w/o lesions  RESPIRATORY: Breathing is even, unlabored. Lung sounds are clear   CARDIOVASCULAR: Heart RRR no murmurs, rubs or gallops. No peripheral edema.   GASTROINTESTINAL: Abdomen is soft, non-tender, not distended w/ normal bowel sounds. GENITOURINARY: Bladder non tender, not distended  MUSCULOSKELETAL: No abnormal joints or musculature NEUROLOGIC:  Cranial nerves 2-12 grossly intact. Moves all extremities  PSYCHIATRIC: confused, no behavioral issues  Patient Active Problem List   Diagnosis Date Noted  . Asthma with acute exacerbation 08/21/2016  . Atrial fibrillation with RVR ([place]) 08/21/2016  . Elevated INR 08/21/2016  . Acute on chronic systolic CHF (congestive heart failure) ([place]) 08/21/2016  . Hyperlipidemia 08/21/2016  . Electrolyte abnormality 08/21/2016  . HCAP (healthcare-associated pneumonia) 08/10/2016  . Sepsis ([place]) 08/09/2016  . Coronary atherosclerosis of native coronary artery 01/21/2014  . Obesity 01/17/2014  . Abdominal pain, generalized 10/18/2012  . GERD (gastroesophageal reflux disease)   . Depression   . Anxiety   . 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[place])   . Stroke ([place])   . Chest pain 01/07/2012  . Asthma 01/07/2012  . A-fib ([place]) 01/07/2012  . CVA (cerebral infarction) 01/07/2012  . Anemia 01/07/2012  . ANEMIA 07/29/2010  . BARRETTS ESOPHAGUS 06/04/2010      Labs reviewed: Basic Metabolic Panel:    Component Value Date/Time   NA 143 08/27/2016   K 3.9 08/27/2016    CL 101 08/12/2016 0527   CO2 26 08/12/2016 0527   GLUCOSE 123 (H) 08/12/2016 0527   BUN 13 08/27/2016   CREATININE 0.8 08/27/2016   CREATININE 0.88 08/12/2016 0527   CALCIUM 10.0 08/12/2016 0527   PROT 5.9 (L) 08/12/2016 0527   ALBUMIN 2.7 (L) 08/12/2016 0527   AST 21 08/25/2016   ALT 25 08/25/2016   ALKPHOS 61 08/25/2016   BILITOT 1.0 08/12/2016 0527   GFRNONAA >60 08/12/2016 0527   GFRAA >60 08/12/2016 0527     Recent Labs  08/09/16 2029  08/10/16 [id number]  08/12/16 0527 08/25/16 08/27/16  NA 132*  < > 132*  < > 134* 135* 143  K 3.3*  < > 4.0  --  4.7 4.3 3.9  CL 98*  --  100*  --  101  --   --   CO2 24  --  21*  --  26  --   --   GLUCOSE 96  --  150*  --  123*  --   --   BUN 23*  < > 23*  < > 29* 21 13  CREATININE 0.96  < > 0.83  < > 0.88 1.0 0.8  CALCIUM 10.1  --  9.9  --  10.0  --   --   < > = values in  this interval not displayed. Liver Function Tests:  Recent Labs  08/09/16 2029 08/12/16 0527 08/25/16  AST 100* 110* 21  ALT 80* 127* 25  ALKPHOS 93 88 61  BILITOT 1.2 1.0  --   PROT 6.6 5.9*  --   ALBUMIN 3.1* 2.7*  --    No results for input(s): LIPASE, AMYLASE in the last 8760 hours. No results for input(s): AMMONIA in the last 8760 hours. CBC:  Recent Labs  08/09/16 2029  08/10/16 0613  08/12/16 0527 08/15/16 1053 08/25/16 08/27/16  WBC 15.8*  < > 17.5*  < > 23.0* 16.9* 12.1 5.6  NEUTROABS 14.1*  --   --   --  20.5*  --   --   --   HGB 10.6*  < > 10.9*  < > 9.5* 9.9* 11.8* 10.2*  HCT 32.3*  < > 33.0*  < > 29.1* 30.7* 37 32*  MCV 81.2  --  81.5  --  82.0 83.0  --   --   PLT 270  < > 262  < > 337 397 319 190  < > = values in this interval not displayed. Lipid No results for input(s): CHOL, HDL, LDLCALC, TRIG in the last 8760 hours.  Cardiac Enzymes:  Recent Labs  08/10/16 0111 08/10/16 0613 08/10/16 1153  TROPONINI 0.07* 0.08* 0.08*   BNP:  Recent Labs  08/09/16 2029  BNP 339.5*   No results found for: MICROALBUR No results found  for: HGBA1C Lab Results  Component Value Date   TSH 6.29 (A) 08/25/2016   No results found for: VITAMINB12 No results found for: FOLATE No results found for: IRON, TIBC, FERRITIN  Imaging and Procedures obtained prior to SNF admission: Dg Chest 2 View  Result Date: 08/09/2016 CLINICAL DATA:  Cough, shortness of breath and fever. EXAM: CHEST  2 VIEW COMPARISON:  04/26/2016. FINDINGS: Stable enlarged cardiac silhouette. Interval patchy opacity in the left lower lobe. Diffuse peribronchial thickening and accentuation of the interstitial markings with progression. Interval minimal patchy opacity at the right lung base. Small left pleural effusion. IMPRESSION: 1. Left lower lobe pneumonia with a small left pleural effusion. 2. Minimal patchy atelectasis or pneumonia at the right lung base. 3. Moderate diffuse bronchitic changes. 4. Stable cardiomegaly. Electronically Signed   By: Claudie Revering M.D.   On: 08/09/2016 20:46     Not all labs, radiology exams or other studies done during hospitalization come through on my EPIC note; however they are reviewed by me.    Assessment and Plan  LOC/SYNCOPE - ECHO- no sig findings;Carotid dopplers and CT angio of neck- occlusion of R CCA, ICA and ECA; L side high grade stenosis of ICA -40-59% CT head - remote bilateral infarction and encephalomacia; pt seen by neuro- no further w/u rec, no obvious cause of syncope  OCCLUSION OF R CAROTID ARTERIES- CCA,ICA,ECA - rec f/u with vascular surgery for repeat dopplers and possible intervention  DEMENTIA - was made note of by hospitalist but no other mention made except daughter states she is unaware of this prior to Mom's PNA;  SNF - CT head findings support probable dementia with superimposed confusion or delirium which should have been looked at while pt was hospitalized  PNA - prior to this hospitalization ;CXR I ED showed improving PNA in L lung SNF - will finish doxycycline course;pt received doxy during  [facility] stay  AF -  Rate controlled; on coumadin INR 1.5 subtheraputic SNF - cont lovenox until warfarin is tharaputic,  cont coreg for rate  HX CVA - residual R side weakness SNF - cont anticoagulant, cont ASA   HTN SNF - cont cozaar 50 mg, imdur 30 mg  and coreg 3.125 mg BID;controlled  HLD SNF - cont Zocor 10 mg daily   Time spent > 45 min;> 50% of time with patient was spent reviewing records, labs, tests and studies, counseling and developing plan of care  Webb Silversmith D. [name], MD

## 2016-08-31 ENCOUNTER — Non-Acute Institutional Stay (SKILLED_NURSING_FACILITY): Payer: Medicare Other | Admitting: Internal Medicine

## 2016-08-31 ENCOUNTER — Encounter: Payer: Self-pay | Admitting: Internal Medicine

## 2016-08-31 DIAGNOSIS — I952 Hypotension due to drugs: Secondary | ICD-10-CM | POA: Diagnosis not present

## 2016-08-31 NOTE — Progress Notes (Signed)
Location:  [place] Room Number: 106 P Place of Service:  SNF (31)  Noah Delaine. [name], MD  Patient Care Team: [name], MD as PCP - General [facility])  Extended Emergency Contact Information Primary Emergency Contact: Tyner,[name] Address: [address] [place], [place] [address] [name] of [place] Phone: [phone] Work Phone: [phone] Relation: Daughter Secondary Emergency Contact: Drue [place] of [place] Phone: [phone] Relation: Niece    Allergies: Review of patient's allergies indicates no known allergies.  Chief Complaint  Patient presents with  . Acute Visit    Acute    HPI: Patient is 75 y.o. female who is being seen for continued low BP. Pt has had no c/o CP, SOB or fainting.Confusion hasn't changed.  Past Medical History:  Diagnosis Date  . Angina pectoris ([place])   . Anxiety   . Asthma   . Barrett's esophagus   . Bronchitis   . 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 Chest pain   . Coronary artery disease    right carotid occlusion   . Depression   . Dysrhythmia    atrial fibrillation  . GERD (gastroesophageal reflux disease)   . Hyperlipidemia   . Hypertension   . Recurrent upper respiratory infection (URI)   . Stroke [facility])    2007, after left endarterectomy    Past Surgical History:  Procedure Laterality Date  . ABDOMINAL HYSTERECTOMY    . BALLOON DILATION  10/18/2012   Procedure: BALLOON DILATION;  Surgeon: Lear Ng, MD;  Location: [facility]OSCOPY;  Service: Endoscopy;  Laterality: N/A;  . CARDIAC CATHETERIZATION    . CAROTID ENDARTERECTOMY     left, complicated by stroke  . CHOLECYSTECTOMY     incidental at time of colectomy  . COLONOSCOPY  12/01/2011   Procedure: COLONOSCOPY;  Surgeon: Lear Ng, MD;  [facility]: [facility];  Service: Endoscopy;  Laterality: N/A;  . COLONOSCOPY  10/18/2012   Procedure: COLONOSCOPY;  Surgeon: Lear Ng, MD;  [facility];  Service: Endoscopy;  Laterality: N/A;  colonic dilation  . ESOPHAGOGASTRODUODENOSCOPY  12/01/2011   Procedure: ESOPHAGOGASTRODUODENOSCOPY (EGD);  Surgeon: Lear Ng, MD;  Location: [facility];  Service: Endoscopy;  Laterality: N/A;  . HOT HEMOSTASIS  12/01/2011   Procedure: HOT HEMOSTASIS (ARGON PLASMA COAGULATION/BICAP);  Surgeon: Lear Ng, MD;  Location: [facility];  Service: Endoscopy;  Laterality: N/A;  . 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 Always use your most recent med list.          acetaminophen 325 MG tablet Commonly known as:  TYLENOL Take 2 tablets (650 mg total) by mouth every 6 (six) hours as needed for mild pain (or Fever >/= 101).   albuterol 108 (90 Base) MCG/ACT inhaler Commonly known as:  PROVENTIL HFA;VENTOLIN HFA Inhale 2 puffs into the lungs every 6 (six) hours as needed for shortness of breath.   alendronate 70 MG tablet Commonly known as:  FOSAMAX Take 70 mg by mouth every Monday. Take with a full glass of water on an empty stomach.   aspirin EC 81 MG tablet Take 81 mg by mouth daily.   BAZA EX Apply 1 application topically 2 (two) times daily as needed (affected areas  on buttocks/sacral.).   budesonide-formoterol 160-4.5 MCG/ACT inhaler Commonly known as:  SYMBICORT Inhale 2 puffs into the lungs 2 (two) times daily.   carvedilol 3.125 MG tablet Commonly known as:  COREG Take 1 tablet (3.125 mg total) by mouth 2 (two) times daily with a meal.   doxycycline 100 MG tablet Commonly known as:  VIBRA-TABS Take 100 mg by mouth 2 (two) times daily. Stop Date 09/01/16   enoxaparin 80 MG/0.8ML injection Commonly known as:  LOVENOX Inject 70 mg into the skin 2 (two) times daily. * until INR is 2.5   ENSURE Take 237 mLs by mouth 3 (three) times daily between meals.   NUTRITIONAL  SUPPLEMENT PO Give 4 oz of MedPass by mouth twice daily   ferrous sulfate 325 (65 FE) MG tablet Take 325 mg by mouth daily with breakfast.   fluticasone 50 MCG/ACT nasal spray Commonly known as:  FLONASE Place 1 spray into both nostrils daily.   guaifenesin 100 MG/5ML syrup Commonly known as:  ROBITUSSIN Take 200 mg by mouth every 6 (six) hours as needed for cough.   ipratropium-albuterol 0.5-2.5 (3) MG/3ML Soln Commonly known as:  DUONEB Take 3 mLs by nebulization every 4 (four) hours as needed.   isosorbide mononitrate 30 MG 24 hr tablet Commonly known as:  IMDUR Take 30 mg by mouth daily.   loratadine 10 MG tablet Commonly known as:  CLARITIN Take 10 mg by mouth daily.   losartan 50 MG tablet Commonly known as:  COZAAR Take 50 mg by mouth every evening.   Melatonin 3 MG Tabs Take 1 tablet by mouth at bedtime.   nitroGLYCERIN 0.4 MG SL tablet Commonly known as:  NITROSTAT Place 0.4 mg under the tongue every 5 (five) minutes as needed for chest pain.   omeprazole 40 MG capsule Commonly known as:  PRILOSEC Take 40 mg by mouth daily.   polyethylene glycol packet Commonly known as:  MIRALAX / GLYCOLAX Take 17 g by mouth daily as needed.   simvastatin 10 MG tablet Commonly known as:  ZOCOR Take 10 mg by mouth at bedtime.   sodium chloride 0.65 % nasal spray Commonly known as:  OCEAN Place 1 spray into the nose every 4 (four) hours as needed for congestion. Into both nares   tiotropium 18 MCG inhalation capsule Commonly known as:  SPIRIVA Place 18 mcg into inhaler and inhale daily.   traZODone 150 MG tablet Commonly known as:  DESYREL Take 50 mg by mouth at bedtime.   UNABLE TO FIND Mineral oil extra heavy : Administer 1 drop to the right ear every Monday.   vitamin B-12 500 MCG tablet Commonly known as:  CYANOCOBALAMIN Take 500 mcg by mouth daily.   Vitamin D3 1000 units Caps Take 1 capsule by mouth daily.   warfarin 2.5 MG tablet Commonly known  as:  COUMADIN Take 2.5 mg by mouth daily with supper.       Meds ordered this encounter  Medications  . Nutritional Supplements (NUTRITIONAL SUPPLEMENT PO)    Sig: Give 4 oz of MedPass by mouth twice daily    Immunization History  Administered Date(s) Administered  . PPD Test 08/16/2016  . Pneumococcal Polysaccharide-23 01/08/2012    Social History  Substance Use Topics  . Smoking status: Former Smoker    Quit date: 04/09/2003  . Smokeless tobacco: Never Used  . Alcohol use No    Review of Systems  DATA OBTAINED: from patient - pt can not fully participate; nurse GENERAL:  no fevers, fatigue, +appetite changes SKIN: No itching, rash HEENT: No complaint RESPIRATORY: No cough, wheezing, SOB CARDIAC: No chest pain, palpitations, lower extremity edema  GI: No abdominal pain, No N/V/D or constipation, No heartburn or reflux  GU: No dysuria, frequency or urgency, or incontinence  MUSCULOSKELETAL: No unrelieved bone/joint pain NEUROLOGIC: No headache, dizziness  PSYCHIATRIC: No overt anxiety or sadness  Vitals:   08/31/16 1324  BP: (!) 75/45  Pulse: (!) 56  Resp: 20  Temp: 97.1 F (36.2 C)   Body mass index is 26.56 kg/m. Physical Exam  GENERAL APPEARANCE: Alert, conversant, No acute distress  SKIN: No diaphoresis rash HEENT: Unremarkable RESPIRATORY: Breathing is even, unlabored. Lung sounds are clear   CARDIOVASCULAR: Heart RRR no murmurs, rubs or gallops. No peripheral edema  GASTROINTESTINAL: Abdomen is soft, non-tender, not distended w/ normal bowel sounds.  GENITOURINARY: Bladder non tender, not distended  MUSCULOSKELETAL: No abnormal joints or musculature NEUROLOGIC: Cranial nerves 2-12 grossly intact. Moves all extremities PSYCHIATRIC: confused, no behavioral issues  Patient Active Problem List   Diagnosis Date Noted  . Asthma with acute exacerbation 08/21/2016  . Atrial fibrillation with RVR ([place]) 08/21/2016  . Elevated INR 08/21/2016  . Acute on  chronic systolic CHF (congestive heart failure) ([place]) 08/21/2016  . Hyperlipidemia 08/21/2016  . Electrolyte abnormality 08/21/2016  . HCAP (healthcare-associated pneumonia) 08/10/2016  . Sepsis ([place]) 08/09/2016  . Coronary atherosclerosis of native coronary artery 01/21/2014  . Obesity 01/17/2014  . Abdominal pain, generalized 10/18/2012  . GERD (gastroesophageal reflux disease)   . Depression   . Anxiety   . 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[place])   . Stroke ([place])   . Chest pain 01/07/2012  . Asthma 01/07/2012  . A-fib ([place]) 01/07/2012  . CVA (cerebral infarction) 01/07/2012  . Anemia 01/07/2012  . ANEMIA 07/29/2010  . BARRETTS ESOPHAGUS 06/04/2010    CMP     Component Value Date/Time   NA 143 08/27/2016   K 3.9 08/27/2016   CL 101 08/12/2016 0527   CO2 26 08/12/2016 0527   GLUCOSE 123 (H) 08/12/2016 0527   BUN 13 08/27/2016   CREATININE 0.8 08/27/2016   CREATININE 0.88 08/12/2016 0527   CALCIUM 10.0 08/12/2016 0527   PROT 5.9 (L) 08/12/2016 0527   ALBUMIN 2.7 (L) 08/12/2016 0527   AST 21 08/25/2016   ALT 25 08/25/2016   ALKPHOS 61 08/25/2016   BILITOT 1.0 08/12/2016 0527   GFRNONAA >60 08/12/2016 0527   GFRAA >60 08/12/2016 0527    Recent Labs  08/09/16 2029  08/10/16 0613  08/12/16 0527 08/25/16 08/27/16  NA 132*  < > 132*  < > 134* 135* 143  K 3.3*  < > 4.0  --  4.7 4.3 3.9  CL 98*  --  100*  --  101  --   --   CO2 24  --  21*  --  26  --   --   GLUCOSE 96  --  150*  --  123*  --   --   BUN 23*  < > 23*  < > 29* 21 13  CREATININE 0.96  < > 0.83  < > 0.88 1.0 0.8  CALCIUM 10.1  --  9.9  --  10.0  --   --   < > = values in this interval not displayed.  Recent Labs  08/09/16 2029 08/12/16 0527 08/25/16  AST 100* 110* 21  ALT 80* 127* 25  ALKPHOS 93 88 61  BILITOT 1.2 1.0  --   PROT 6.6 5.9*  --   ALBUMIN 3.1* 2.7*  --     Recent Labs  08/09/16 2029  08/10/16 0613  08/12/16 0527 08/15/16 1053 08/25/16 08/27/16    WBC 15.8*  < > 17.5*  < > 23.0* 16.9* 12.1 5.6  NEUTROABS 14.1*  --   --   --  20.5*  --   --   --   HGB 10.6*  < > 10.9*  < > 9.5* 9.9* 11.8* 10.2*  HCT 32.3*  < > 33.0*  < > 29.1* 30.7* 37 32*  MCV 81.2  --  81.5  --  82.0 83.0  --   --   PLT 270  < > 262  < > 337 397 319 190  < > = values in this interval not displayed. No results for input(s): CHOL, LDLCALC, TRIG in the last 8760 hours.  Invalid input(s): HCL No results found for: MICROALBUR Lab Results  Component Value Date   TSH 6.29 (A) 08/25/2016   No results found for: HGBA1C No results found for: CHOL, HDL, LDLCALC, LDLDIRECT, TRIG, CHOLHDL  Significant Diagnostic Results in last 30 days:  Dg Chest 2 View  Result Date: 08/09/2016 CLINICAL DATA:  Cough, shortness of breath and fever. EXAM: CHEST  2 VIEW COMPARISON:  04/26/2016. FINDINGS: Stable enlarged cardiac silhouette. Interval patchy opacity in the left lower lobe. Diffuse peribronchial thickening and accentuation of the interstitial markings with progression. Interval minimal patchy opacity at the right lung base. Small left pleural effusion. IMPRESSION: 1. Left lower lobe pneumonia with a small left pleural effusion. 2. Minimal patchy atelectasis or pneumonia at the right lung base. 3. Moderate diffuse bronchitic changes. 4. Stable cardiomegaly. Electronically Signed   By: Claudie Revering M.D.   On: 08/09/2016 20:46   Dg Swallowing Func-speech Pathology  Result Date: 08/13/2016 Objective Swallowing Evaluation: Type of Study: Bedside Swallow Evaluation Patient Details Name: ALLA BRANDENBERGER MRN: [id number] Date of Birth: [birth year] Today's Date: 08/13/2016 Time: SLP Start Time (ACUTE ONLY): 1226-SLP Stop Time (ACUTE ONLY): 1245 SLP Time Calculation (min) (ACUTE ONLY): 19 min Past Medical History: Past Medical History: Diagnosis Date . Angina pectoris ([place])  . Anxiety  . Asthma  . Barrett's esophagus  . Bronchitis  . Cancer ([place])   uterian . Chest pain  . Coronary artery disease    right carotid occlusion  . Depression  . Dysrhythmia   atrial fibrillation . GERD (gastroesophageal reflux disease)  . Hyperlipidemia  . Hypertension  . Recurrent upper respiratory infection (URI)  . Stroke [facility])   2007, after left endarterectomy Past Surgical History: Past Surgical History: Procedure Laterality Date . ABDOMINAL HYSTERECTOMY   . BALLOON DILATION  10/18/2012  Procedure: BALLOON DILATION;  Surgeon: Lear Ng, MD;  [facility];  Service: Endoscopy;  Laterality: N/A; . CARDIAC CATHETERIZATION   . CAROTID ENDARTERECTOMY    left, complicated by stroke . CHOLECYSTECTOMY    incidental at time of colectomy . COLONOSCOPY  12/01/2011  Procedure: COLONOSCOPY;  Surgeon: Lear Ng, MD;  [facility];  Service: Endoscopy;  Laterality: N/A; . COLONOSCOPY  10/18/2012  Procedure: COLONOSCOPY;  Surgeon: Lear Ng, MD;  [facility];  Service: Endoscopy;  Laterality: N/A;  colonic dilation . ESOPHAGOGASTRODUODENOSCOPY  12/01/2011  Procedure: ESOPHAGOGASTRODUODENOSCOPY (EGD);  Surgeon: Lear Ng, MD;  Location: [facility];  Service: Endoscopy;  Laterality: N/A; . HOT HEMOSTASIS  12/01/2011  Procedure: HOT HEMOSTASIS (ARGON PLASMA COAGULATION/BICAP);  Surgeon: Lear Ng, MD;  Location: [facility];  Service: Endoscopy;  Laterality: N/A; . 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  PMH + for cva, Barrett's esophagus, HTN, anxiety, depression.  PSH includes CEA.  Pt also with lordosis and spurring at C5-C6, C6-C7.  She has been having difficulty swallowing and speech/swallow eval ordered.  From chart review, RN report of deficits and suspected pna -  MBS indicated.   Subjective: pt awake in chair Visit Diagnosis: No diagnosis found. Assessment / Plan / Recommendation CHL IP CLINICAL IMPRESSIONS 08/13/2016 Therapy Diagnosis Mild oral phase dysphagia;Mild  pharyngeal phase dysphagia Clinical Impression Mild oropharyngeal dysphagia without aspiration or deep penetration of any consistency tested. Decreased oral coordination resulted in premature spillage of barium into pharynx = poorly controlled. Pharyngeal swallow was delayed to pyriform sinus with thin liquid via cup allowing trace upper laryngeal penetration.  Cued cough cleared TRACE penetrates.  Pharyngeal swallow was strong without residuals pt complained of sensation of residuals at distal esophagus, could not view during testing due to pt's stature.  Suspect given pt's h/o Barrett's and reflux, her primary aspiration risk if from known esophageal deficits.  Recommend dys3/thin due to pt's oral deficits/poor dentition.  Of note, pt was fidgety during entire test.   Impact on safety and function Mild aspiration risk   CHL IP TREATMENT RECOMMENDATION 08/13/2016 Treatment Recommendations Therapy as outlined in treatment plan below   Prognosis 08/13/2016 Prognosis for Safe Diet Advancement Fair Barriers to Reach Goals Cognitive deficits Barriers/Prognosis Comment -- CHL IP DIET RECOMMENDATION 08/13/2016 SLP Diet Recommendations Dysphagia 3 (Mech soft) solids;Thin liquid Liquid Administration via Cup;Straw Medication Administration Whole meds with puree Compensations Slow rate;Small sips/bites;Clear throat intermittently Postural Changes Remain semi-upright after after feeds/meals (Comment);Seated upright at 90 degrees   CHL IP OTHER RECOMMENDATIONS 08/13/2016 Recommended Consults Consider esophageal assessment Oral Care Recommendations Oral care BID Other Recommendations --   No flowsheet data found.  CHL IP FREQUENCY AND DURATION 08/13/2016 Speech Therapy Frequency (ACUTE ONLY) min 1 x/week Treatment Duration 1 week      CHL IP ORAL PHASE 08/13/2016 Oral Phase Impaired Oral - Pudding Teaspoon -- Oral - Pudding Cup -- Oral - Honey Teaspoon -- Oral - Honey Cup -- Oral - Nectar Teaspoon -- Oral - Nectar Cup Weak lingual  manipulation;Premature spillage Oral - Nectar Straw Weak lingual manipulation;Premature spillage Oral - Thin Teaspoon Weak lingual manipulation;Premature spillage Oral - Thin Cup Weak lingual manipulation;Premature spillage Oral - Thin Straw -- Oral - Puree Weak lingual manipulation;Premature spillage Oral - Mech Soft -- Oral - Regular Weak lingual manipulation;Premature spillage Oral - Multi-Consistency -- Oral - Pill -- Oral Phase - Comment --  CHL IP PHARYNGEAL PHASE 08/13/2016 Pharyngeal Phase Impaired Pharyngeal- Pudding Teaspoon -- Pharyngeal -- Pharyngeal- Pudding Cup -- Pharyngeal -- Pharyngeal- Honey Teaspoon -- Pharyngeal -- Pharyngeal- Honey Cup -- Pharyngeal -- Pharyngeal- Nectar Teaspoon Delayed swallow initiation-vallecula Pharyngeal -- Pharyngeal- Nectar Cup Delayed swallow initiation-vallecula Pharyngeal -- Pharyngeal- Nectar Straw Delayed swallow initiation-vallecula Pharyngeal -- Pharyngeal- Thin Teaspoon Delayed swallow initiation-vallecula;Delayed swallow initiation-pyriform sinuses Pharyngeal -- Pharyngeal- Thin Cup Delayed swallow initiation-vallecula;Delayed swallow initiation-pyriform sinuses Trace penetration of thin, cleared with cued throat clear Pharyngeal -- Pharyngeal- Thin Straw -- Pharyngeal -- Pharyngeal- Puree Delayed swallow initiation-vallecula Pharyngeal -- Pharyngeal- Mechanical Soft -- Pharyngeal -- Pharyngeal- Regular -- Pharyngeal -- Pharyngeal- Multi-consistency -- Pharyngeal -- Pharyngeal- Pill -- Pharyngeal -- Pharyngeal Comment --  CHL IP CERVICAL ESOPHAGEAL PHASE 08/13/2016 Cervical Esophageal Phase WFL Pudding Teaspoon -- Pudding Cup -- Honey Teaspoon -- Honey Cup -- Nectar Teaspoon -- Nectar Cup -- Nectar Straw -- Thin Teaspoon -- Thin Cup -- Thin Straw -- Puree -- Mechanical Soft -- Regular -- Multi-consistency -- Pill -- Cervical Esophageal Comment -- No flowsheet data found. Luanna Salk, [place] [facility] SLP [phone]               Assessment and Plan;  HYPOTENSION - pt  reportedly was taking all these meds at home but clearly not tolerating them now; have d/c cozaar; will d/c further if needed    Time spent > 25 min;> 50% of time with patient was spent reviewing records, labs, tests and studies, counseling and developing plan of care  Noah Delaine. [name], MD

## 2016-09-02 ENCOUNTER — Encounter: Payer: Self-pay | Admitting: Internal Medicine

## 2016-09-02 ENCOUNTER — Non-Acute Institutional Stay (SKILLED_NURSING_FACILITY): Payer: Medicare Other | Admitting: Internal Medicine

## 2016-09-02 DIAGNOSIS — F0391 Unspecified dementia with behavioral disturbance: Secondary | ICD-10-CM | POA: Insufficient documentation

## 2016-09-02 DIAGNOSIS — F03918 Unspecified dementia, unspecified severity, with other behavioral disturbance: Secondary | ICD-10-CM

## 2016-09-02 DIAGNOSIS — I6521 Occlusion and stenosis of right carotid artery: Secondary | ICD-10-CM | POA: Insufficient documentation

## 2016-09-02 DIAGNOSIS — I4891 Unspecified atrial fibrillation: Secondary | ICD-10-CM | POA: Diagnosis not present

## 2016-09-02 DIAGNOSIS — R791 Abnormal coagulation profile: Secondary | ICD-10-CM | POA: Diagnosis not present

## 2016-09-02 DIAGNOSIS — R4020 Unspecified coma: Secondary | ICD-10-CM | POA: Insufficient documentation

## 2016-09-02 HISTORY — DX: Unspecified dementia, unspecified severity, with other behavioral disturbance: F03.918

## 2016-09-02 HISTORY — DX: Unspecified dementia with behavioral disturbance: F03.91

## 2016-09-02 LAB — CBC AND DIFFERENTIAL
HCT: 35 % — AB (ref 36–46)
HEMOGLOBIN: 11.5 g/dL — AB (ref 12.0–16.0)
Platelets: 199 10*3/uL (ref 150–399)
WBC: 5.8 10^3/mL

## 2016-09-02 LAB — BASIC METABOLIC PANEL
BUN: 18 mg/dL (ref 4–21)
CREATININE: 1 mg/dL (ref 0.5–1.1)
Glucose: 89 mg/dL
POTASSIUM: 4.2 mmol/L (ref 3.4–5.3)
SODIUM: 138 mmol/L (ref 137–147)

## 2016-09-02 NOTE — Progress Notes (Signed)
Location:  [facility] Room Number: 106P Place of Service:  SNF (31)  Cindy [name]. [name], MD  Patient Care Team: [name], MD as PCP - General [facility])  Extended Emergency Contact Information Primary Emergency Contact: Tyner,[name] Address: [address] [place], [place] [address] [name] of [place] Phone: [phone] Work Phone: [phone] Relation: Daughter Secondary Emergency Contact: Drue [place] of [place] Phone: [phone] Relation: Niece    Allergies: Review of patient's allergies indicates no known allergies.  Chief Complaint  Patient presents with  . Acute Visit    Acute    HPI: Patient is 75 y.o. female with AF on coumadin who is being seen for subtheraputic INR . It's been checked every several days. Pt's INR was supratheraputic in the hospital. Maybe there is a better way.  Past Medical History:  Diagnosis Date  . Angina pectoris ([place])   . Anxiety   . Asthma   . Barrett's esophagus   . Bronchitis   . 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 Chest pain   . Coronary artery disease    right carotid occlusion   . Depression   . Dysrhythmia    atrial fibrillation  . GERD (gastroesophageal reflux disease)   . Hyperlipidemia   . Hypertension   . Recurrent upper respiratory infection (URI)   . Stroke [facility])    2007, after left endarterectomy    Past Surgical History:  Procedure Laterality Date  . ABDOMINAL HYSTERECTOMY    . BALLOON DILATION  10/18/2012   Procedure: BALLOON DILATION;  Surgeon: Lear Ng, MD;  Location: [facility];  Service: Endoscopy;  Laterality: N/A;  . CARDIAC CATHETERIZATION    . CAROTID ENDARTERECTOMY     left, complicated by stroke  . CHOLECYSTECTOMY     incidental at time of colectomy  . COLONOSCOPY  12/01/2011   Procedure: COLONOSCOPY;  Surgeon: Lear Ng, MD;  [facility]: [facility];  Service: Endoscopy;  Laterality: N/A;  .  COLONOSCOPY  10/18/2012   Procedure: COLONOSCOPY;  Surgeon: Lear Ng, MD;  [facility];  Service: Endoscopy;  Laterality: N/A;  colonic dilation  . ESOPHAGOGASTRODUODENOSCOPY  12/01/2011   Procedure: ESOPHAGOGASTRODUODENOSCOPY (EGD);  Surgeon: Lear Ng, MD;  Location: [facility];  Service: Endoscopy;  Laterality: N/A;  . HOT HEMOSTASIS  12/01/2011   Procedure: HOT HEMOSTASIS (ARGON PLASMA COAGULATION/BICAP);  Surgeon: Lear Ng, MD;  Location: [facility];  Service: Endoscopy;  Laterality: N/A;  . 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 Always use your most recent med list.          acetaminophen 325 MG tablet Commonly known as:  TYLENOL Take 2 tablets (650 mg total) by mouth every 6 (six) hours as needed for mild pain (or Fever >/= 101).   albuterol 108 (90 Base) MCG/ACT inhaler Commonly known as:  PROVENTIL HFA;VENTOLIN HFA Inhale 2 puffs into the lungs every 6 (six) hours as needed for shortness of breath.   alendronate 70 MG tablet Commonly known as:  FOSAMAX Take 70 mg by mouth every Monday. Take with a full glass of water on an empty stomach.   aspirin EC 81 MG tablet Take 81 mg by mouth daily.   BAZA EX Apply  1 application topically 2 (two) times daily as needed (affected areas on buttocks/sacral.).   budesonide-formoterol 160-4.5 MCG/ACT inhaler Commonly known as:  SYMBICORT Inhale 2 puffs into the lungs 2 (two) times daily.   carvedilol 3.125 MG tablet Commonly known as:  COREG Take 1 tablet (3.125 mg total) by mouth 2 (two) times daily with a meal.   enoxaparin 80 MG/0.8ML injection Commonly known as:  LOVENOX Inject 70 mg into the skin 2 (two) times daily. * until INR is 2.5   ENSURE Take 237 mLs by mouth 3 (three) times daily between meals.   NUTRITIONAL SUPPLEMENT PO Give 4 oz of MedPass by mouth twice daily    ferrous sulfate 325 (65 FE) MG tablet Take 325 mg by mouth daily with breakfast.   fluticasone 50 MCG/ACT nasal spray Commonly known as:  FLONASE Place 1 spray into both nostrils daily.   guaifenesin 100 MG/5ML syrup Commonly known as:  ROBITUSSIN Take 200 mg by mouth every 6 (six) hours as needed for cough.   ipratropium-albuterol 0.5-2.5 (3) MG/3ML Soln Commonly known as:  DUONEB Take 3 mLs by nebulization every 4 (four) hours as needed.   isosorbide mononitrate 30 MG 24 hr tablet Commonly known as:  IMDUR Take 30 mg by mouth daily.   loratadine 10 MG tablet Commonly known as:  CLARITIN Take 10 mg by mouth daily.   Melatonin 3 MG Tabs Take 1 tablet by mouth at bedtime.   nitroGLYCERIN 0.4 MG SL tablet Commonly known as:  NITROSTAT Place 0.4 mg under the tongue every 5 (five) minutes as needed for chest pain.   omeprazole 40 MG capsule Commonly known as:  PRILOSEC Take 40 mg by mouth daily.   polyethylene glycol packet Commonly known as:  MIRALAX / GLYCOLAX Take 17 g by mouth daily as needed.   simvastatin 10 MG tablet Commonly known as:  ZOCOR Take 10 mg by mouth at bedtime.   sodium chloride 0.65 % nasal spray Commonly known as:  OCEAN Place 1 spray into the nose every 4 (four) hours as needed for congestion. Into both nares   tiotropium 18 MCG inhalation capsule Commonly known as:  SPIRIVA Place 18 mcg into inhaler and inhale daily.   traZODone 150 MG tablet Commonly known as:  DESYREL Take 50 mg by mouth at bedtime.   UNABLE TO FIND Mineral oil extra heavy : Administer 1 drop to the right ear every Monday.   vitamin B-12 500 MCG tablet Commonly known as:  CYANOCOBALAMIN Take 500 mcg by mouth daily.   Vitamin D3 1000 units Caps Take 1 capsule by mouth daily.   warfarin 2.5 MG tablet Commonly known as:  COUMADIN Take 2.5 mg by mouth daily with supper.       No orders of the defined types were placed in this encounter.   Immunization  History  Administered Date(s) Administered  . PPD Test 08/16/2016  . Pneumococcal Polysaccharide-23 01/08/2012    Social History  Substance Use Topics  . Smoking status: Former Smoker    Quit date: 04/09/2003  . Smokeless tobacco: Never Used  . Alcohol use No    Review of Systems  DATA OBTAINED: from patient, nurse GENERAL:  no fevers, fatigue, appetite changes SKIN: No itching, rash HEENT: No complaint RESPIRATORY: No cough, wheezing, SOB CARDIAC: No chest pain, palpitations, lower extremity edema  GI: No abdominal pain, No N/V/D or constipation, No heartburn or reflux  GU: No dysuria, frequency or urgency, or incontinence  MUSCULOSKELETAL: No unrelieved  bone/joint pain NEUROLOGIC: No headache, dizziness  PSYCHIATRIC: No overt anxiety or sadness  Vitals:   09/02/16 1445  BP: (!) 135/55  Pulse: 75  Resp: 16  Temp: 98.1 F (36.7 C)   Body mass index is 26.56 kg/m. Physical Exam  GENERAL APPEARANCE: Alert, conversant, No acute distress  SKIN: No diaphoresis rash HEENT: Unremarkable RESPIRATORY: Breathing is even, unlabored. Lung sounds are clear   CARDIOVASCULAR: Heart RRR no murmurs, rubs or gallops. No peripheral edema  GASTROINTESTINAL: Abdomen is soft, non-tender, not distended w/ normal bowel sounds.  GENITOURINARY: Bladder non tender, not distended  MUSCULOSKELETAL: No abnormal joints or musculature NEUROLOGIC: Cranial nerves 2-12 grossly intact. Moves all extremities PSYCHIATRIC: Mood and affect appropriate to situation, no behavioral issues  Patient Active Problem List   Diagnosis Date Noted  . Asthma with acute exacerbation 08/21/2016  . Atrial fibrillation with RVR ([place]) 08/21/2016  . Elevated INR 08/21/2016  . Acute on chronic systolic CHF (congestive heart failure) ([place]) 08/21/2016  . Hyperlipidemia 08/21/2016  . Electrolyte abnormality 08/21/2016  . HCAP (healthcare-associated pneumonia) 08/10/2016  . Sepsis ([place]) 08/09/2016  . Coronary  atherosclerosis of native coronary artery 01/21/2014  . Obesity 01/17/2014  . Abdominal pain, generalized 10/18/2012  . GERD (gastroesophageal reflux disease)   . Depression   . Anxiety   . 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 Stroke ([place])   . Chest pain 01/07/2012  . Asthma 01/07/2012  . A-fib ([place]) 01/07/2012  . CVA (cerebral infarction) 01/07/2012  . Anemia 01/07/2012  . ANEMIA 07/29/2010  . BARRETTS ESOPHAGUS 06/04/2010    CMP     Component Value Date/Time   NA 138 09/02/2016   K 4.2 09/02/2016   CL 101 08/12/2016 0527   CO2 26 08/12/2016 0527   GLUCOSE 123 (H) 08/12/2016 0527   BUN 18 09/02/2016   CREATININE 1.0 09/02/2016   CREATININE 0.88 08/12/2016 0527   CALCIUM 10.0 08/12/2016 0527   PROT 5.9 (L) 08/12/2016 0527   ALBUMIN 2.7 (L) 08/12/2016 0527   AST 21 08/25/2016   ALT 25 08/25/2016   ALKPHOS 61 08/25/2016   BILITOT 1.0 08/12/2016 0527   GFRNONAA >60 08/12/2016 0527   GFRAA >60 08/12/2016 0527    Recent Labs  08/09/16 2029  08/10/16 [id number]  08/12/16 0527 08/25/16 08/27/16 09/02/16  NA 132*  < > 132*  < > 134* 135* 143 138  K 3.3*  < > 4.0  --  4.7 4.3 3.9 4.2  CL 98*  --  100*  --  101  --   --   --   CO2 24  --  21*  --  26  --   --   --   GLUCOSE 96  --  150*  --  123*  --   --   --   BUN 23*  < > 23*  < > 29* 21 13 18   CREATININE 0.96  < > 0.83  < > 0.88 1.0 0.8 1.0  CALCIUM 10.1  --  9.9  --  10.0  --   --   --   < > = values in this interval not displayed.  Recent Labs  08/09/16 2029 08/12/16 0527 08/25/16  AST 100* 110* 21  ALT 80* 127* 25  ALKPHOS 93 88 61  BILITOT 1.2 1.0  --   PROT 6.6 5.9*  --   ALBUMIN 3.1* 2.7*  --     Recent Labs  08/09/16 2029  08/10/16 [id number]  08/12/16 0527 08/15/16 1053 08/25/16 08/27/16 09/02/16  WBC 15.8*  < > 17.5*  < > 23.0* 16.9* 12.1 5.6 5.8  NEUTROABS 14.1*  --   --   --  20.5*  --   --   --   --   HGB 10.6*  < > 10.9*  < > 9.5* 9.9* 11.8* 10.2* 11.5*  HCT  32.3*  < > 33.0*  < > 29.1* 30.7* 37 32* 35*  MCV 81.2  --  81.5  --  82.0 83.0  --   --   --   PLT 270  < > 262  < > 337 397 319 190 199  < > = values in this interval not displayed. No results for input(s): CHOL, LDLCALC, TRIG in the last 8760 hours.  Invalid input(s): HCL No results found for: MICROALBUR Lab Results  Component Value Date   TSH 6.29 (A) 08/25/2016   No results found for: HGBA1C No results found for: CHOL, HDL, LDLCALC, LDLDIRECT, TRIG, CHOLHDL  Significant Diagnostic Results in last 30 days:  Dg Chest 2 View  Result Date: 08/09/2016 CLINICAL DATA:  Cough, shortness of breath and fever. EXAM: CHEST  2 VIEW COMPARISON:  04/26/2016. FINDINGS: Stable enlarged cardiac silhouette. Interval patchy opacity in the left lower lobe. Diffuse peribronchial thickening and accentuation of the interstitial markings with progression. Interval minimal patchy opacity at the right lung base. Small left pleural effusion. IMPRESSION: 1. Left lower lobe pneumonia with a small left pleural effusion. 2. Minimal patchy atelectasis or pneumonia at the right lung base. 3. Moderate diffuse bronchitic changes. 4. Stable cardiomegaly. Electronically Signed   By: Claudie Revering M.D.   On: 08/09/2016 20:46   Dg Swallowing Func-speech Pathology  Result Date: 08/13/2016 Objective Swallowing Evaluation: Type of Study: Bedside Swallow Evaluation Patient Details Name: MOSLEY STITZ MRN: [id number] Date of Birth: [birth year] Today's Date: 08/13/2016 Time: SLP Start Time (ACUTE ONLY): 1226-SLP Stop Time (ACUTE ONLY): 1245 SLP Time Calculation (min) (ACUTE ONLY): 19 min Past Medical History: Past Medical History: Diagnosis Date . Angina pectoris ([place])  . Anxiety  . Asthma  . Barrett's esophagus  . Bronchitis  . Cancer ([place])   uterian . Chest pain  . Coronary artery disease   right carotid occlusion  . Depression  . Dysrhythmia   atrial fibrillation . GERD (gastroesophageal reflux disease)  . Hyperlipidemia  .  Hypertension  . Recurrent upper respiratory infection (URI)  . Stroke [facility])   2007, after left endarterectomy Past Surgical History: Past Surgical History: Procedure Laterality Date . ABDOMINAL HYSTERECTOMY   . BALLOON DILATION  10/18/2012  Procedure: BALLOON DILATION;  Surgeon: Lear Ng, MD;  [facility];  Service: Endoscopy;  Laterality: N/A; . CARDIAC CATHETERIZATION   . CAROTID ENDARTERECTOMY    left, complicated by stroke . CHOLECYSTECTOMY    incidental at time of colectomy . COLONOSCOPY  12/01/2011  Procedure: COLONOSCOPY;  Surgeon: Lear Ng, MD;  [facility];  Service: Endoscopy;  Laterality: N/A; . COLONOSCOPY  10/18/2012  Procedure: COLONOSCOPY;  Surgeon: Lear Ng, MD;  [facility];  Service: Endoscopy;  Laterality: N/A;  colonic dilation . ESOPHAGOGASTRODUODENOSCOPY  12/01/2011  Procedure: ESOPHAGOGASTRODUODENOSCOPY (EGD);  Surgeon: Lear Ng, MD;  Location: [facility];  Service: Endoscopy;  Laterality: N/A; . HOT HEMOSTASIS  12/01/2011  Procedure: HOT HEMOSTASIS (ARGON PLASMA COAGULATION/BICAP);  Surgeon: Lear Ng, MD;  Location: [facility];  Service: Endoscopy;  Laterality: N/A; . 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  PMH + for cva, Barrett's esophagus, HTN, anxiety, depression.  PSH includes CEA.  Pt also with lordosis and spurring at C5-C6, C6-C7.  She has been having difficulty swallowing and speech/swallow eval ordered.  From chart review, RN report of deficits and suspected pna -  MBS indicated.   Subjective: pt awake in chair Visit Diagnosis: No diagnosis found. Assessment / Plan / Recommendation CHL IP CLINICAL IMPRESSIONS 08/13/2016 Therapy Diagnosis Mild oral phase dysphagia;Mild pharyngeal phase dysphagia Clinical Impression Mild oropharyngeal dysphagia without aspiration or deep penetration of any consistency  tested. Decreased oral coordination resulted in premature spillage of barium into pharynx = poorly controlled. Pharyngeal swallow was delayed to pyriform sinus with thin liquid via cup allowing trace upper laryngeal penetration.  Cued cough cleared TRACE penetrates.  Pharyngeal swallow was strong without residuals pt complained of sensation of residuals at distal esophagus, could not view during testing due to pt's stature.  Suspect given pt's h/o Barrett's and reflux, her primary aspiration risk if from known esophageal deficits.  Recommend dys3/thin due to pt's oral deficits/poor dentition.  Of note, pt was fidgety during entire test.   Impact on safety and function Mild aspiration risk   CHL IP TREATMENT RECOMMENDATION 08/13/2016 Treatment Recommendations Therapy as outlined in treatment plan below   Prognosis 08/13/2016 Prognosis for Safe Diet Advancement Fair Barriers to Reach Goals Cognitive deficits Barriers/Prognosis Comment -- CHL IP DIET RECOMMENDATION 08/13/2016 SLP Diet Recommendations Dysphagia 3 (Mech soft) solids;Thin liquid Liquid Administration via Cup;Straw Medication Administration Whole meds with puree Compensations Slow rate;Small sips/bites;Clear throat intermittently Postural Changes Remain semi-upright after after feeds/meals (Comment);Seated upright at 90 degrees   CHL IP OTHER RECOMMENDATIONS 08/13/2016 Recommended Consults Consider esophageal assessment Oral Care Recommendations Oral care BID Other Recommendations --   No flowsheet data found.  CHL IP FREQUENCY AND DURATION 08/13/2016 Speech Therapy Frequency (ACUTE ONLY) min 1 x/week Treatment Duration 1 week      CHL IP ORAL PHASE 08/13/2016 Oral Phase Impaired Oral - Pudding Teaspoon -- Oral - Pudding Cup -- Oral - Honey Teaspoon -- Oral - Honey Cup -- Oral - Nectar Teaspoon -- Oral - Nectar Cup Weak lingual manipulation;Premature spillage Oral - Nectar Straw Weak lingual manipulation;Premature spillage Oral - Thin Teaspoon Weak lingual  manipulation;Premature spillage Oral - Thin Cup Weak lingual manipulation;Premature spillage Oral - Thin Straw -- Oral - Puree Weak lingual manipulation;Premature spillage Oral - Mech Soft -- Oral - Regular Weak lingual manipulation;Premature spillage Oral - Multi-Consistency -- Oral - Pill -- Oral Phase - Comment --  CHL IP PHARYNGEAL PHASE 08/13/2016 Pharyngeal Phase Impaired Pharyngeal- Pudding Teaspoon -- Pharyngeal -- Pharyngeal- Pudding Cup -- Pharyngeal -- Pharyngeal- Honey Teaspoon -- Pharyngeal -- Pharyngeal- Honey Cup -- Pharyngeal -- Pharyngeal- Nectar Teaspoon Delayed swallow initiation-vallecula Pharyngeal -- Pharyngeal- Nectar Cup Delayed swallow initiation-vallecula Pharyngeal -- Pharyngeal- Nectar Straw Delayed swallow initiation-vallecula Pharyngeal -- Pharyngeal- Thin Teaspoon Delayed swallow initiation-vallecula;Delayed swallow initiation-pyriform sinuses Pharyngeal -- Pharyngeal- Thin Cup Delayed swallow initiation-vallecula;Delayed swallow initiation-pyriform sinuses Trace penetration of thin, cleared with cued throat clear Pharyngeal -- Pharyngeal- Thin Straw -- Pharyngeal -- Pharyngeal- Puree Delayed swallow initiation-vallecula Pharyngeal -- Pharyngeal- Mechanical Soft -- Pharyngeal -- Pharyngeal- Regular -- Pharyngeal -- Pharyngeal- Multi-consistency -- Pharyngeal -- Pharyngeal- Pill -- Pharyngeal -- Pharyngeal Comment --  CHL IP CERVICAL ESOPHAGEAL PHASE 08/13/2016 Cervical Esophageal Phase WFL Pudding Teaspoon -- Pudding Cup -- Honey Teaspoon -- Honey  Cup -- Surveyor, quantity Cup -- [employer] -- Thin Teaspoon -- Thin Cup -- Thin Straw -- Puree -- Mechanical Soft -- Regular -- Multi-consistency -- Pill -- Cervical Esophageal Comment -- No flowsheet data found. Luanna Salk, MS [facility] SLP [phone]               Assessment and Plan  AF/ SUBTHERAPUTIC INR/SUPRATHERAPUTIC INR - I don't know of a reason why pt can't be on a novel, xarelto is easier to be covered by insurance. Pt's  calculated CrCl is 46.9 so her dose will be 15 mg daily. Lovenox has been d/c and coumadin has been d/c- INR today is 1.6 - so xarelto can be started this pm.    Time spent > 25 min;> 50% of time with patient was spent reviewing records, labs, tests and studies, counseling and developing plan of care  Webb Silversmith D. [name], MD

## 2016-09-23 ENCOUNTER — Non-Acute Institutional Stay (SKILLED_NURSING_FACILITY): Payer: Medicare Other | Admitting: Internal Medicine

## 2016-09-23 ENCOUNTER — Encounter: Payer: Self-pay | Admitting: Internal Medicine

## 2016-09-23 DIAGNOSIS — D508 Other iron deficiency anemias: Secondary | ICD-10-CM

## 2016-09-23 DIAGNOSIS — F331 Major depressive disorder, recurrent, moderate: Secondary | ICD-10-CM | POA: Diagnosis not present

## 2016-09-23 DIAGNOSIS — K219 Gastro-esophageal reflux disease without esophagitis: Secondary | ICD-10-CM | POA: Diagnosis not present

## 2016-09-23 NOTE — Progress Notes (Signed)
Location:  [place] Room Number: 106P Place of Service:  SNF (31)  Cindy [name]. Cindy Coil, MD  Patient Care Team: [name], MD as PCP - General [facility])  Extended Emergency Contact Information Primary Emergency Contact: Tyner,[name] Address: [address] [place], [place] [address] [name] of [place] Phone: [phone] Work Phone: [phone] Relation: Daughter Secondary Emergency Contact: Drue Novel [place] of [place] Phone: [phone] Relation: Niece    Allergies: Review of patient's allergies indicates no known allergies.  Chief Complaint  Patient presents with  . Medical Management of Chronic Issues    Routine Visit    HPI: Patient is 75 y.o. female who is being seen for routine issues of iron def anemia, depression and GERD.  Past Medical History:  Diagnosis Date  . Angina pectoris ([place])   . Anxiety   . Asthma   . Barrett's esophagus   . Bronchitis   . 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 Chest pain   . Coronary artery disease    right carotid occlusion   . Depression   . Dysrhythmia    atrial fibrillation  . GERD (gastroesophageal reflux disease)   . Hyperlipidemia   . Hypertension   . Recurrent upper respiratory infection (URI)   . Stroke [facility])    2007, after left endarterectomy    Past Surgical History:  Procedure Laterality Date  . ABDOMINAL HYSTERECTOMY    . BALLOON DILATION  10/18/2012   Procedure: BALLOON DILATION;  Surgeon: Lear Ng, MD;  [facility] ENDOSCOPY;  Service: Endoscopy;  Laterality: N/A;  . CARDIAC CATHETERIZATION    . CAROTID ENDARTERECTOMY     left, complicated by stroke  . CHOLECYSTECTOMY     incidental at time of colectomy  . COLONOSCOPY  12/01/2011   Procedure: COLONOSCOPY;  Surgeon: Lear Ng, MD;  [facility];  Service: Endoscopy;  Laterality: N/A;  . COLONOSCOPY  10/18/2012   Procedure: COLONOSCOPY;  Surgeon: Lear Ng, MD;  [facility];  Service: Endoscopy;  Laterality: N/A;  colonic dilation  . ESOPHAGOGASTRODUODENOSCOPY  12/01/2011   Procedure: ESOPHAGOGASTRODUODENOSCOPY (EGD);  Surgeon: Lear Ng, MD;  Location: [facility];  Service: Endoscopy;  Laterality: N/A;  . HOT HEMOSTASIS  12/01/2011   Procedure: HOT HEMOSTASIS (ARGON PLASMA COAGULATION/BICAP);  Surgeon: Lear Ng, MD;  Location: [facility];  Service: Endoscopy;  Laterality: N/A;  . 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 Always use your most recent med list.          acetaminophen 325 MG tablet Commonly known as:  TYLENOL Take 2 tablets (650 mg total) by mouth every 6 (six) hours as needed for mild pain (or Fever >/= 101).   albuterol 108 (90 Base) MCG/ACT inhaler Commonly known as:  PROVENTIL HFA;VENTOLIN HFA Inhale 2 puffs into the lungs every 6 (six) hours as needed for shortness of breath.   alendronate 70 MG tablet Commonly known as:  FOSAMAX Take 70 mg by mouth every Monday. Take with a full glass of water on an empty stomach.   aspirin EC 81 MG tablet Take 81 mg by mouth daily.   BAZA EX Apply 1 application topically 2 (two) times daily as needed (affected areas on buttocks/sacral.).  budesonide-formoterol 160-4.5 MCG/ACT inhaler Commonly known as:  SYMBICORT Inhale 2 puffs into the lungs 2 (two) times daily.   carvedilol 3.125 MG tablet Commonly known as:  COREG Take 1 tablet (3.125 mg total) by mouth 2 (two) times daily with a meal.   ferrous sulfate 325 (65 FE) MG tablet Take 325 mg by mouth daily with breakfast.   fluticasone 50 MCG/ACT nasal spray Commonly known as:  FLONASE Place 1 spray into both nostrils daily.   guaifenesin 100 MG/5ML syrup Commonly known as:  ROBITUSSIN Take 200 mg by mouth every 6 (six) hours as needed for cough.     ipratropium-albuterol 0.5-2.5 (3) MG/3ML Soln Commonly known as:  DUONEB Take 3 mLs by nebulization every 4 (four) hours as needed.   isosorbide mononitrate 30 MG 24 hr tablet Commonly known as:  IMDUR Take 30 mg by mouth daily.   loratadine 10 MG tablet Commonly known as:  CLARITIN Take 10 mg by mouth daily.   Melatonin 3 MG Tabs Take 1 tablet by mouth at bedtime.   mirtazapine 7.5 MG tablet Commonly known as:  REMERON Take 7.5 mg by mouth at bedtime.   nitroGLYCERIN 0.4 MG SL tablet Commonly known as:  NITROSTAT Place 0.4 mg under the tongue every 5 (five) minutes as needed for chest pain.   omeprazole 40 MG capsule Commonly known as:  PRILOSEC Take 40 mg by mouth daily.   polyethylene glycol packet Commonly known as:  MIRALAX / GLYCOLAX Take 17 g by mouth daily as needed.   sertraline 50 MG tablet Commonly known as:  ZOLOFT Take 50 mg by mouth at bedtime.   simvastatin 10 MG tablet Commonly known as:  ZOCOR Take 10 mg by mouth at bedtime.   sodium chloride 0.65 % nasal spray Commonly known as:  OCEAN Place 1 spray into the nose every 4 (four) hours as needed for congestion. Into both nares   tiotropium 18 MCG inhalation capsule Commonly known as:  SPIRIVA Place 18 mcg into inhaler and inhale daily.   traZODone 150 MG tablet Commonly known as:  DESYREL Take 50 mg by mouth at bedtime.   UNABLE TO FIND Mineral oil extra heavy : Administer 1 drop to the right ear every Monday.   vitamin B-12 500 MCG tablet Commonly known as:  CYANOCOBALAMIN Take 500 mcg by mouth daily.   Vitamin D3 1000 units Caps Take 1 capsule by mouth daily.       Meds ordered this encounter  Medications  . mirtazapine (REMERON) 7.5 MG tablet    Sig: Take 7.5 mg by mouth at bedtime.  . sertraline (ZOLOFT) 50 MG tablet    Sig: Take 50 mg by mouth at bedtime.    Immunization History  Administered Date(s) Administered  . Influenza-Unspecified 08/26/2016  . PPD Test  08/16/2016  . Pneumococcal Polysaccharide-23 01/08/2012    Social History  Substance Use Topics  . Smoking status: Former Smoker    Quit date: 04/09/2003  . Smokeless tobacco: Never Used  . Alcohol use No    Review of Systems  DATA OBTAINED: from patient-cant participate fully, nursing - without concerns GENERAL:  no fevers, fatigue, appetite changes SKIN: No itching, rash HEENT: No complaint RESPIRATORY: No cough, wheezing, SOB CARDIAC: No chest pain, palpitations, lower extremity edema  GI: No abdominal pain, No N/V/D or constipation, No heartburn or reflux  GU: No dysuria, frequency or urgency, or incontinence  MUSCULOSKELETAL: No unrelieved bone/joint pain NEUROLOGIC: No headache, dizziness  PSYCHIATRIC: No overt  anxiety or sadness  Vitals:   09/23/16 1029  BP: 135/72  Pulse: 65  Resp: 18  Temp: 97.2 F (36.2 C)   Body mass index is 25.23 kg/m. Physical Exam  GENERAL APPEARANCE: Alert, conversant, No acute distress  SKIN: No diaphoresis rash HEENT: Unremarkable RESPIRATORY: Breathing is even, unlabored. Lung sounds are clear   CARDIOVASCULAR: Heart RRR no murmurs, rubs or gallops. No peripheral edema  GASTROINTESTINAL: Abdomen is soft, non-tender, not distended w/ normal bowel sounds.  GENITOURINARY: Bladder non tender, not distended  MUSCULOSKELETAL: No abnormal joints or musculature NEUROLOGIC: Cranial nerves 2-12 grossly intact. Moves all extremities PSYCHIATRIC: confused, no behavioral issues  Patient Active Problem List   Diagnosis Date Noted  . LOC (loss of consciousness) ([place]) 09/02/2016  . 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 Dementia without behavioral disturbance 09/02/2016  . Asthma with acute exacerbation 08/21/2016  . Atrial fibrillation with RVR ([place]) 08/21/2016  . Elevated INR 08/21/2016  . Acute on chronic systolic CHF (congestive heart failure) ([place]) 08/21/2016  . Hyperlipidemia 08/21/2016  . Electrolyte abnormality 08/21/2016   . HCAP (healthcare-associated pneumonia) 08/10/2016  . Sepsis ([place]) 08/09/2016  . Coronary atherosclerosis of native coronary artery 01/21/2014  . Obesity 01/17/2014  . Abdominal pain, generalized 10/18/2012  . GERD (gastroesophageal reflux disease)   . Depression   . Anxiety   . 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[place])   . Stroke ([place])   . Chest pain 01/07/2012  . Asthma 01/07/2012  . A-fib ([place]) 01/07/2012  . 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 Anemia 01/07/2012  . ANEMIA 07/29/2010  . BARRETTS ESOPHAGUS 06/04/2010    CMP     Component Value Date/Time   NA 138 09/02/2016   K 4.2 09/02/2016   CL 101 08/12/2016 0527   CO2 26 08/12/2016 0527   GLUCOSE 123 (H) 08/12/2016 0527   BUN 18 09/02/2016   CREATININE 1.0 09/02/2016   CREATININE 0.88 08/12/2016 0527   CALCIUM 10.0 08/12/2016 0527   PROT 5.9 (L) 08/12/2016 0527   ALBUMIN 2.7 (L) 08/12/2016 0527   AST 21 08/25/2016   ALT 25 08/25/2016   ALKPHOS 61 08/25/2016   BILITOT 1.0 08/12/2016 0527   GFRNONAA >60 08/12/2016 0527   GFRAA >60 08/12/2016 0527    Recent Labs  08/09/16 2029  08/10/16 [id number]  08/12/16 0527 08/25/16 08/27/16 09/02/16  NA 132*  < > 132*  < > 134* 135* 143 138  K 3.3*  < > 4.0  --  4.7 4.3 3.9 4.2  CL 98*  --  100*  --  101  --   --   --   CO2 24  --  21*  --  26  --   --   --   GLUCOSE 96  --  150*  --  123*  --   --   --   BUN 23*  < > 23*  < > 29* 21 13 18   CREATININE 0.96  < > 0.83  < > 0.88 1.0 0.8 1.0  CALCIUM 10.1  --  9.9  --  10.0  --   --   --   < > = values in this interval not displayed.  Recent Labs  08/09/16 2029 08/12/16 0527 08/25/16  AST 100* 110* 21  ALT 80* 127* 25  ALKPHOS 93 88 61  BILITOT 1.2 1.0  --   PROT 6.6 5.9*  --   ALBUMIN 3.1* 2.7*  --  Recent Labs  08/09/16 2029  08/10/16 0613  08/12/16 0527 08/15/16 1053 08/25/16 08/27/16 09/02/16  WBC 15.8*  < > 17.5*  < > 23.0* 16.9* 12.1 5.6 5.8  NEUTROABS 14.1*   --   --   --  20.5*  --   --   --   --   HGB 10.6*  < > 10.9*  < > 9.5* 9.9* 11.8* 10.2* 11.5*  HCT 32.3*  < > 33.0*  < > 29.1* 30.7* 37 32* 35*  MCV 81.2  --  81.5  --  82.0 83.0  --   --   --   PLT 270  < > 262  < > 337 397 319 190 199  < > = values in this interval not displayed. No results for input(s): CHOL, LDLCALC, TRIG in the last 8760 hours.  Invalid input(s): HCL No results found for: MICROALBUR Lab Results  Component Value Date   TSH 6.29 (A) 08/25/2016   No results found for: HGBA1C No results found for: CHOL, HDL, LDLCALC, LDLDIRECT, TRIG, CHOLHDL  Significant Diagnostic Results in last 30 days:  No results found.  Assessment and Plan  Anemia Hb 11.5 with MCV of 83; plan to cont FeSO4 325 mg daily;will monitor at intervals  Depression Per phone order 1/10 pt's zoloft was added back to her med regimen;she describes sx that ate depressive;will order ECG to look at QT interval  GERD (gastroesophageal reflux disease) No sugns or symptoms of reflux;plan to cont protonix 40 mg daly    Cindy [name]. Cindy Coil, MD

## 2016-09-23 NOTE — Assessment & Plan Note (Signed)
No sugns or symptoms of reflux;plan to cont protonix 40 mg daly

## 2016-09-23 NOTE — Assessment & Plan Note (Signed)
Hb 11.5 with MCV of 83; plan to cont FeSO4 325 mg daily;will monitor at intervals

## 2016-09-23 NOTE — Assessment & Plan Note (Signed)
Per phone order 1/10 pt's zoloft was added back to her med regimen;she describes sx that ate depressive;will order ECG to look at QT interval

## 2016-09-29 ENCOUNTER — Encounter: Payer: Self-pay | Admitting: Internal Medicine

## 2016-09-29 ENCOUNTER — Non-Acute Institutional Stay (SKILLED_NURSING_FACILITY): Payer: Medicare Other | Admitting: Internal Medicine

## 2016-09-29 DIAGNOSIS — E034 Atrophy of thyroid (acquired): Secondary | ICD-10-CM

## 2016-09-29 DIAGNOSIS — E538 Deficiency of other specified B group vitamins: Secondary | ICD-10-CM | POA: Diagnosis not present

## 2016-09-29 DIAGNOSIS — F22 Delusional disorders: Secondary | ICD-10-CM | POA: Diagnosis not present

## 2016-09-29 LAB — CBC AND DIFFERENTIAL
HCT: 42 % (ref 36–46)
HEMOGLOBIN: 13.4 g/dL (ref 12.0–16.0)
Platelets: 189 10*3/uL (ref 150–399)
WBC: 7.1 10^3/mL

## 2016-09-29 LAB — LIPID PANEL
CHOLESTEROL: 172 mg/dL (ref 0–200)
HDL: 73 mg/dL — AB (ref 35–70)
LDL Cholesterol: 73 mg/dL
Triglycerides: 128 mg/dL (ref 40–160)

## 2016-09-29 LAB — HEMOGLOBIN A1C: Hemoglobin A1C: 4.8

## 2016-09-29 LAB — HEPATIC FUNCTION PANEL
ALT: 19 U/L (ref 7–35)
AST: 21 U/L (ref 13–35)
Alkaline Phosphatase: 61 U/L (ref 25–125)
BILIRUBIN, TOTAL: 1 mg/dL

## 2016-09-29 LAB — BASIC METABOLIC PANEL
BUN: 16 mg/dL (ref 4–21)
Creatinine: 1.1 mg/dL (ref 0.5–1.1)
Glucose: 82 mg/dL
POTASSIUM: 4.1 mmol/L (ref 3.4–5.3)
SODIUM: 140 mmol/L (ref 137–147)

## 2016-09-29 LAB — VITAMIN D 25 HYDROXY (VIT D DEFICIENCY, FRACTURES): Vit D, 25-Hydroxy: 40

## 2016-09-29 LAB — TSH: TSH: 9.47 u[IU]/mL — AB (ref 0.41–5.90)

## 2016-09-29 LAB — VITAMIN B12: VITAMIN B 12: 1820

## 2016-09-29 NOTE — Progress Notes (Signed)
Location:  [place] Room Number: 106P Place of Service:  SNF (31)  Noah Delaine. [name], MD  Patient Care Team: [name], MD as PCP - General [facility])  Extended Emergency Contact Information Primary Emergency Contact: Tyner,[name] Address: [address] [place], [place] [address] [name] of [place] Phone: [phone] Work Phone: [phone] Relation: Daughter Secondary Emergency Contact: Drue Novel States of [place] Phone: [phone] Relation: Niece    Allergies: Review of patient's allergies indicates no known allergies.  Chief Complaint  Patient presents with  . Acute Visit    Acute    HPI: Patient is 75 y.o. female who is being seen for several issues.On routine labs pt's Vit B12 was 1820, very high. Pt's TSH is 9.47 up from 6.29 in 07/2016. Nursing has reported behaviors- paranoia, pt thinks that the staff is trying to hurt her, etc.  Past Medical History:  Diagnosis Date  . Angina pectoris ([place])   . Anxiety   . Asthma   . Barrett's esophagus   . Bronchitis   . 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 Chest pain   . Coronary artery disease    right carotid occlusion   . Depression   . Dysrhythmia    atrial fibrillation  . GERD (gastroesophageal reflux disease)   . Hyperlipidemia   . Hypertension   . Recurrent upper respiratory infection (URI)   . Stroke [facility])    2007, after left endarterectomy    Past Surgical History:  Procedure Laterality Date  . ABDOMINAL HYSTERECTOMY    . BALLOON DILATION  10/18/2012   Procedure: BALLOON DILATION;  Surgeon: Lear Ng, MD;  [facility];  Service: Endoscopy;  Laterality: N/A;  . CARDIAC CATHETERIZATION    . CAROTID ENDARTERECTOMY     left, complicated by stroke  . CHOLECYSTECTOMY     incidental at time of colectomy  . COLONOSCOPY  12/01/2011   Procedure: COLONOSCOPY;  Surgeon: Lear Ng, MD;  [facility];   Service: Endoscopy;  Laterality: N/A;  . COLONOSCOPY  10/18/2012   Procedure: COLONOSCOPY;  Surgeon: Lear Ng, MD;  [facility];  Service: Endoscopy;  Laterality: N/A;  colonic dilation  . ESOPHAGOGASTRODUODENOSCOPY  12/01/2011   Procedure: ESOPHAGOGASTRODUODENOSCOPY (EGD);  Surgeon: Lear Ng, MD;  [facility];  Service: Endoscopy;  Laterality: N/A;  . HOT HEMOSTASIS  12/01/2011   Procedure: HOT HEMOSTASIS (ARGON PLASMA COAGULATION/BICAP);  Surgeon: Lear Ng, MD;  [facility];  Service: Endoscopy;  Laterality: N/A;  . 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 Always use your most recent med list.          acetaminophen 325 MG tablet Commonly known as:  TYLENOL Take 2 tablets (650 mg total) by mouth every 6 (six) hours as needed for mild pain (or Fever >/= 101).   albuterol 108 (90 Base) MCG/ACT inhaler Commonly known as:  PROVENTIL HFA;VENTOLIN HFA Inhale 2 puffs into the lungs every 6 (six) hours as needed for shortness of breath.   alendronate 70 MG tablet Commonly known as:  FOSAMAX Take 70 mg by mouth every Monday. Take with a full glass of water on an empty stomach.   aspirin EC 81 MG tablet Take  81 mg by mouth daily.   BAZA EX Apply 1 application topically 2 (two) times daily as needed (affected areas on buttocks/sacral.).   budesonide-formoterol 160-4.5 MCG/ACT inhaler Commonly known as:  SYMBICORT Inhale 2 puffs into the lungs 2 (two) times daily.   carvedilol 3.125 MG tablet Commonly known as:  COREG Take 1 tablet (3.125 mg total) by mouth 2 (two) times daily with a meal.   ferrous sulfate 325 (65 FE) MG tablet Take 325 mg by mouth daily with breakfast.   fluticasone 50 MCG/ACT nasal spray Commonly known as:  FLONASE Place 1 spray into both nostrils daily.   guaifenesin 100 MG/5ML syrup Commonly  known as:  ROBITUSSIN Take 200 mg by mouth every 6 (six) hours as needed for cough.   ipratropium-albuterol 0.5-2.5 (3) MG/3ML Soln Commonly known as:  DUONEB Take 3 mLs by nebulization every 4 (four) hours as needed.   isosorbide mononitrate 30 MG 24 hr tablet Commonly known as:  IMDUR Take 30 mg by mouth daily.   loratadine 10 MG tablet Commonly known as:  CLARITIN Take 10 mg by mouth daily.   Melatonin 3 MG Tabs Take 1 tablet by mouth at bedtime.   mirtazapine 7.5 MG tablet Commonly known as:  REMERON Take 7.5 mg by mouth at bedtime.   nitroGLYCERIN 0.4 MG SL tablet Commonly known as:  NITROSTAT Place 0.4 mg under the tongue every 5 (five) minutes as needed for chest pain.   omeprazole 40 MG capsule Commonly known as:  PRILOSEC Take 40 mg by mouth daily.   polyethylene glycol packet Commonly known as:  MIRALAX / GLYCOLAX Take 17 g by mouth daily as needed.   sertraline 50 MG tablet Commonly known as:  ZOLOFT Take 50 mg by mouth at bedtime.   simvastatin 10 MG tablet Commonly known as:  ZOCOR Take 10 mg by mouth at bedtime.   sodium chloride 0.65 % nasal spray Commonly known as:  OCEAN Place 1 spray into the nose every 4 (four) hours as needed for congestion. Into both nares   tiotropium 18 MCG inhalation capsule Commonly known as:  SPIRIVA Place 18 mcg into inhaler and inhale daily.   traZODone 150 MG tablet Commonly known as:  DESYREL Take 50 mg by mouth at bedtime.   UNABLE TO FIND Mineral oil extra heavy : Administer 1 drop to the right ear every Monday.   vitamin B-12 500 MCG tablet Commonly known as:  CYANOCOBALAMIN Take 500 mcg by mouth daily.   Vitamin D3 1000 units Caps Take 1 capsule by mouth daily.       No orders of the defined types were placed in this encounter.   Immunization History  Administered Date(s) Administered  . Influenza-Unspecified 08/26/2016  . PPD Test 08/16/2016  . Pneumococcal Polysaccharide-23 01/08/2012     Social History  Substance Use Topics  . Smoking status: Former Smoker    Quit date: 04/09/2003  . Smokeless tobacco: Never Used  . Alcohol use No    Review of Systems  DATA OBTAINED: from patient, nurse GENERAL:  no fevers, fatigue, appetite changes SKIN: No itching, rash HEENT: No complaint RESPIRATORY: No cough, wheezing, SOB CARDIAC: No chest pain, palpitations, lower extremity edema  GI: No abdominal pain, No N/V/D or constipation, No heartburn or reflux  GU: No dysuria, frequency or urgency, or incontinence  MUSCULOSKELETAL: No unrelieved bone/joint pain NEUROLOGIC: No headache, dizziness  PSYCHIATRIC: everyone is after her  Vitals:   09/29/16 1507  BP: 135/72  Pulse:  70  Resp: 18  Temp: 97 F (36.1 C)   Body mass index is 25.51 kg/m. Physical Exam  GENERAL APPEARANCE: Alert, conversant, No acute distress  SKIN: No diaphoresis rash HEENT: Unremarkable RESPIRATORY: Breathing is even, unlabored. Lung sounds are clear   CARDIOVASCULAR: Heart RRR no murmurs, rubs or gallops. No peripheral edema  GASTROINTESTINAL: Abdomen is soft, non-tender, not distended w/ normal bowel sounds.  GENITOURINARY: Bladder non tender, not distended  MUSCULOSKELETAL: No abnormal joints or musculature NEUROLOGIC: Cranial nerves 2-12 grossly intact. Moves all extremities PSYCHIATRIC: confused, angry, paranoid  Patient Active Problem List   Diagnosis Date Noted  . LOC (loss of consciousness) ([place]) 09/02/2016  . 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 Dementia without behavioral disturbance 09/02/2016  . Asthma with acute exacerbation 08/21/2016  . Atrial fibrillation with RVR ([place]) 08/21/2016  . Elevated INR 08/21/2016  . Acute on chronic systolic CHF (congestive heart failure) ([place]) 08/21/2016  . Hyperlipidemia 08/21/2016  . Electrolyte abnormality 08/21/2016  . HCAP (healthcare-associated pneumonia) 08/10/2016  . Sepsis ([place]) 08/09/2016  . Coronary atherosclerosis of  native coronary artery 01/21/2014  . Obesity 01/17/2014  . Abdominal pain, generalized 10/18/2012  . GERD (gastroesophageal reflux disease)   . Depression   . Anxiety   . 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[place])   . Stroke ([place])   . Chest pain 01/07/2012  . Asthma 01/07/2012  . A-fib ([place]) 01/07/2012  . 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 Anemia 01/07/2012  . ANEMIA 07/29/2010  . BARRETTS ESOPHAGUS 06/04/2010    CMP     Component Value Date/Time   NA 138 09/02/2016   K 4.2 09/02/2016   CL 101 08/12/2016 0527   CO2 26 08/12/2016 0527   GLUCOSE 123 (H) 08/12/2016 0527   BUN 18 09/02/2016   CREATININE 1.0 09/02/2016   CREATININE 0.88 08/12/2016 0527   CALCIUM 10.0 08/12/2016 0527   PROT 5.9 (L) 08/12/2016 0527   ALBUMIN 2.7 (L) 08/12/2016 0527   AST 21 08/25/2016   ALT 25 08/25/2016   ALKPHOS 61 08/25/2016   BILITOT 1.0 08/12/2016 0527   GFRNONAA >60 08/12/2016 0527   GFRAA >60 08/12/2016 0527    Recent Labs  08/09/16 2029  08/10/16 [id number]  08/12/16 0527 08/25/16 08/27/16 09/02/16  NA 132*  < > 132*  < > 134* 135* 143 138  K 3.3*  < > 4.0  --  4.7 4.3 3.9 4.2  CL 98*  --  100*  --  101  --   --   --   CO2 24  --  21*  --  26  --   --   --   GLUCOSE 96  --  150*  --  123*  --   --   --   BUN 23*  < > 23*  < > 29* 21 13 18   CREATININE 0.96  < > 0.83  < > 0.88 1.0 0.8 1.0  CALCIUM 10.1  --  9.9  --  10.0  --   --   --   < > = values in this interval not displayed.  Recent Labs  08/09/16 2029 08/12/16 0527 08/25/16  AST 100* 110* 21  ALT 80* 127* 25  ALKPHOS 93 88 61  BILITOT 1.2 1.0  --   PROT 6.6 5.9*  --   ALBUMIN 3.1* 2.7*  --     Recent Labs  08/09/16 2029  08/10/16 [id number]  08/12/16 [id number]  08/15/16 1053 08/25/16 08/27/16 09/02/16  WBC 15.8*  < > 17.5*  < > 23.0* 16.9* 12.1 5.6 5.8  NEUTROABS 14.1*  --   --   --  20.5*  --   --   --   --   HGB 10.6*  < > 10.9*  < > 9.5* 9.9* 11.8* 10.2* 11.5*  HCT 32.3*  < >  33.0*  < > 29.1* 30.7* 37 32* 35*  MCV 81.2  --  81.5  --  82.0 83.0  --   --   --   PLT 270  < > 262  < > 337 397 319 190 199  < > = values in this interval not displayed. No results for input(s): CHOL, LDLCALC, TRIG in the last 8760 hours.  Invalid input(s): HCL No results found for: MICROALBUR Lab Results  Component Value Date   TSH 6.29 (A) 08/25/2016   No results found for: HGBA1C No results found for: CHOL, HDL, LDLCALC, LDLDIRECT, TRIG, CHOLHDL  Significant Diagnostic Results in last 30 days:  No results found.  Assessment and Plan  VITMAIN B12 DEF - PT NO LONGER DEF, HAS HIGH LEVELOF B12-1820; have d/c B12  HYPOTHYROIDISM- TSH has increased over past few months;will start synthroid 25 mcg daily and repeat TSH in 6 weeks.  PARANOIA - I have started pt on seroquel 50 mg qHS and asked for psych to see her next time they come.;will monitor result of seroquel    Time spent . 25 min;> 50% of time with patient was spent reviewing records, labs, tests and studies, counseling and developing plan of care  Noah Delaine. [name], MD

## 2016-10-03 ENCOUNTER — Encounter: Payer: Self-pay | Admitting: Internal Medicine

## 2016-10-03 DIAGNOSIS — F22 Delusional disorders: Secondary | ICD-10-CM

## 2016-10-03 DIAGNOSIS — E039 Hypothyroidism, unspecified: Secondary | ICD-10-CM | POA: Insufficient documentation

## 2016-10-03 HISTORY — DX: Delusional disorders: F22

## 2016-10-25 ENCOUNTER — Encounter: Payer: Self-pay | Admitting: Internal Medicine

## 2016-10-25 ENCOUNTER — Non-Acute Institutional Stay (SKILLED_NURSING_FACILITY): Payer: Medicare Other | Admitting: Internal Medicine

## 2016-10-25 DIAGNOSIS — I110 Hypertensive heart disease with heart failure: Secondary | ICD-10-CM | POA: Diagnosis not present

## 2016-10-25 DIAGNOSIS — I4891 Unspecified atrial fibrillation: Secondary | ICD-10-CM | POA: Diagnosis not present

## 2016-10-25 DIAGNOSIS — I25119 Atherosclerotic heart disease of native coronary artery with unspecified angina pectoris: Secondary | ICD-10-CM

## 2016-10-25 DIAGNOSIS — I209 Angina pectoris, unspecified: Secondary | ICD-10-CM | POA: Diagnosis not present

## 2016-10-25 NOTE — Progress Notes (Signed)
Location:  [place] Room Number: 305D Place of Service:  SNF ([id number])  Cindy Delaine. [name], MD  Patient Care Team: [name], MD as PCP - General [facility])  Extended Emergency Contact Information Primary Emergency Contact: Tyner,[name] Address: [address] [place], [place] [address] [name] of [place] Phone: [phone] Work Phone: [phone] Relation: Daughter Secondary Emergency Contact: [place] of [place] Phone: [phone] Relation: Niece    Allergies: Patient has no known allergies.  Chief Complaint  Patient presents with  . Medical Management of Chronic Issues    Routine Visit    HPI: Patient is 75 y.o. female who is being seen for routine issues of CAD, HTN and AF.  Past Medical History:  Diagnosis Date  . Angina pectoris ([place])   . Anxiety   . Asthma   . Barrett's esophagus   . Bronchitis   . 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 Chest pain   . Coronary artery disease    right carotid occlusion   . Depression   . Dysrhythmia    atrial fibrillation  . GERD (gastroesophageal reflux disease)   . Hyperlipidemia   . Hypertension   . Recurrent upper respiratory infection (URI)   . Stroke [facility])    2007, after left endarterectomy    Past Surgical History:  Procedure Laterality Date  . ABDOMINAL HYSTERECTOMY    . BALLOON DILATION  10/18/2012   Procedure: BALLOON DILATION;  Surgeon: Lear Ng, MD;  [facility]: [facility] ENDOSCOPY;  Service: Endoscopy;  Laterality: N/A;  . CARDIAC CATHETERIZATION    . CAROTID ENDARTERECTOMY     left, complicated by stroke  . CHOLECYSTECTOMY     incidental at time of colectomy  . COLONOSCOPY  12/01/2011   Procedure: COLONOSCOPY;  Surgeon: Lear Ng, MD;  [facility];  Service: Endoscopy;  Laterality: N/A;  . COLONOSCOPY  10/18/2012   Procedure: COLONOSCOPY;  Surgeon: Lear Ng, MD;  [facility];   Service: Endoscopy;  Laterality: N/A;  colonic dilation  . ESOPHAGOGASTRODUODENOSCOPY  12/01/2011   Procedure: ESOPHAGOGASTRODUODENOSCOPY (EGD);  Surgeon: Lear Ng, MD;  Location: [facility];  Service: Endoscopy;  Laterality: N/A;  . HOT HEMOSTASIS  12/01/2011   Procedure: HOT HEMOSTASIS (ARGON PLASMA COAGULATION/BICAP);  Surgeon: Lear Ng, MD;  Location: [facility];  Service: Endoscopy;  Laterality: N/A;  . 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 Always use your most recent med list.          acetaminophen 325 MG tablet Commonly known as:  TYLENOL Take 2 tablets (650 mg total) by mouth every 6 (six) hours as needed for mild pain (or Fever >/= 101).   albuterol 108 (90 Base) MCG/ACT inhaler Commonly known as:  PROVENTIL HFA;VENTOLIN HFA Inhale 2 puffs into the lungs every 6 (six) hours as needed for shortness of breath.   alendronate 70 MG tablet Commonly known as:  FOSAMAX Take 70 mg by mouth every Monday. Take with a full glass of water on an empty stomach.   aspirin EC 81 MG tablet Take 81 mg by mouth daily.   BAZA EX Apply 1 application topically 2 (two) times daily as needed (affected areas on buttocks/sacral.).   budesonide-formoterol 160-4.5 MCG/ACT inhaler  Commonly known as:  SYMBICORT Inhale 2 puffs into the lungs 2 (two) times daily.   carvedilol 3.125 MG tablet Commonly known as:  COREG Take 1 tablet (3.125 mg total) by mouth 2 (two) times daily with a meal.   ferrous sulfate 325 (65 FE) MG tablet Take 325 mg by mouth daily with breakfast.   fluticasone 50 MCG/ACT nasal spray Commonly known as:  FLONASE Place 1 spray into both nostrils daily.   guaifenesin 100 MG/5ML syrup Commonly known as:  ROBITUSSIN Take 200 mg by mouth every 6 (six) hours as needed for cough.   ipratropium-albuterol 0.5-2.5 (3) MG/3ML Soln Commonly known  as:  DUONEB Take 3 mLs by nebulization every 4 (four) hours as needed.   isosorbide mononitrate 30 MG 24 hr tablet Commonly known as:  IMDUR Take 30 mg by mouth daily.   loratadine 10 MG tablet Commonly known as:  CLARITIN Take 10 mg by mouth daily.   Melatonin 3 MG Tabs Take 1 tablet by mouth at bedtime.   mirtazapine 7.5 MG tablet Commonly known as:  REMERON Take 7.5 mg by mouth at bedtime.   nitroGLYCERIN 0.4 MG SL tablet Commonly known as:  NITROSTAT Place 0.4 mg under the tongue every 5 (five) minutes as needed for chest pain.   omeprazole 40 MG capsule Commonly known as:  PRILOSEC Take 40 mg by mouth daily.   polyethylene glycol packet Commonly known as:  MIRALAX / GLYCOLAX Take 17 g by mouth daily as needed.   sertraline 50 MG tablet Commonly known as:  ZOLOFT Take 50 mg by mouth at bedtime.   simvastatin 10 MG tablet Commonly known as:  ZOCOR Take 10 mg by mouth at bedtime.   sodium chloride 0.65 % nasal spray Commonly known as:  OCEAN Place 1 spray into the nose every 4 (four) hours as needed for congestion. Into both nares   tiotropium 18 MCG inhalation capsule Commonly known as:  SPIRIVA Place 18 mcg into inhaler and inhale daily.   traZODone 150 MG tablet Commonly known as:  DESYREL Take 50 mg by mouth at bedtime.   UNABLE TO FIND Mineral oil extra heavy : Administer 1 drop to the right ear every Monday.   vitamin B-12 500 MCG tablet Commonly known as:  CYANOCOBALAMIN Take 500 mcg by mouth daily.   Vitamin D3 1000 units Caps Take 1 capsule by mouth daily.       No orders of the defined types were placed in this encounter.   Immunization History  Administered Date(s) Administered  . Influenza-Unspecified 08/26/2016  . PPD Test 08/16/2016  . Pneumococcal Polysaccharide-23 01/08/2012    Social History  Substance Use Topics  . Smoking status: Former Smoker    Quit date: 04/09/2003  . Smokeless tobacco: Never Used  . Alcohol use No      Review of Systems  DATA OBTAINED: from patient- no c/o GENERAL:  no fevers, fatigue, appetite changes SKIN: No itching, rash HEENT: No complaint RESPIRATORY: No cough, wheezing, SOB CARDIAC: No chest pain, palpitations, lower extremity edema  GI: No abdominal pain, No N/V/D or constipation, No heartburn or reflux  GU: No dysuria, frequency or urgency, or incontinence  MUSCULOSKELETAL: No unrelieved bone/joint pain NEUROLOGIC: No headache, dizziness  PSYCHIATRIC: No overt anxiety or sadness  Vitals:   10/25/16 1148  BP: (!) 150/82  Pulse: 74  Resp: 16  Temp: 98.6 F (37 C)   Body mass index is 25 kg/m. Physical Exam  GENERAL APPEARANCE: Alert, conversant,  No acute distress  SKIN: No diaphoresis rash HEENT: Unremarkable RESPIRATORY: Breathing is even, unlabored. Lung sounds are clear   CARDIOVASCULAR: Heart RRR no murmurs, rubs or gallops. No peripheral edema  GASTROINTESTINAL: Abdomen is soft, non-tender, not distended w/ normal bowel sounds.  GENITOURINARY: Bladder non tender, not distended  MUSCULOSKELETAL: No abnormal joints or musculature NEUROLOGIC: Cranial nerves 2-12 grossly intact. Moves all extremities PSYCHIATRIC: Mood and affect appropriate to situation, not nearly as grumpy as prior, no behavioral issues  Patient Active Problem List   Diagnosis Date Noted  . Hypothyroidism 10/03/2016  . Paranoia ([place]) 10/03/2016  . LOC (loss of consciousness) ([place]) 09/02/2016  . 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 Dementia without behavioral disturbance 09/02/2016  . Asthma with acute exacerbation 08/21/2016  . Atrial fibrillation with RVR ([place]) 08/21/2016  . Elevated INR 08/21/2016  . Acute on chronic systolic CHF (congestive heart failure) ([place]) 08/21/2016  . Hyperlipidemia 08/21/2016  . Electrolyte abnormality 08/21/2016  . HCAP (healthcare-associated pneumonia) 08/10/2016  . Sepsis ([place]) 08/09/2016  . Coronary atherosclerosis of native coronary  artery 01/21/2014  . Obesity 01/17/2014  . Abdominal pain, generalized 10/18/2012  . GERD (gastroesophageal reflux disease)   . Depression   . Anxiety   . 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[place])   . Stroke ([place])   . Chest pain 01/07/2012  . Asthma 01/07/2012  . A-fib ([place]) 01/07/2012  . 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 Anemia 01/07/2012  . ANEMIA 07/29/2010  . BARRETTS ESOPHAGUS 06/04/2010    CMP     Component Value Date/Time   NA 138 09/02/2016   K 4.2 09/02/2016   CL 101 08/12/2016 0527   CO2 26 08/12/2016 0527   GLUCOSE 123 (H) 08/12/2016 0527   BUN 18 09/02/2016   CREATININE 1.0 09/02/2016   CREATININE 0.88 08/12/2016 0527   CALCIUM 10.0 08/12/2016 0527   PROT 5.9 (L) 08/12/2016 0527   ALBUMIN 2.7 (L) 08/12/2016 0527   AST 21 08/25/2016   ALT 25 08/25/2016   ALKPHOS 61 08/25/2016   BILITOT 1.0 08/12/2016 0527   GFRNONAA >60 08/12/2016 0527   GFRAA >60 08/12/2016 0527    Recent Labs  08/09/16 2029  08/10/16 [id number]  08/12/16 0527 08/25/16 08/27/16 09/02/16  NA 132*  < > 132*  < > 134* 135* 143 138  K 3.3*  < > 4.0  --  4.7 4.3 3.9 4.2  CL 98*  --  100*  --  101  --   --   --   CO2 24  --  21*  --  26  --   --   --   GLUCOSE 96  --  150*  --  123*  --   --   --   BUN 23*  < > 23*  < > 29* 21 13 18   CREATININE 0.96  < > 0.83  < > 0.88 1.0 0.8 1.0  CALCIUM 10.1  --  9.9  --  10.0  --   --   --   < > = values in this interval not displayed.  Recent Labs  08/09/16 2029 08/12/16 0527 08/25/16  AST 100* 110* 21  ALT 80* 127* 25  ALKPHOS 93 88 61  BILITOT 1.2 1.0  --   PROT 6.6 5.9*  --   ALBUMIN 3.1* 2.7*  --     Recent Labs  08/09/16 2029  08/10/16 [id number]  08/12/16 0527 08/15/16 1053 08/25/16 08/27/16  09/02/16  WBC 15.8*  < > 17.5*  < > 23.0* 16.9* 12.1 5.6 5.8  NEUTROABS 14.1*  --   --   --  20.5*  --   --   --   --   HGB 10.6*  < > 10.9*  < > 9.5* 9.9* 11.8* 10.2* 11.5*  HCT 32.3*  < > 33.0*  < > 29.1*  30.7* 37 32* 35*  MCV 81.2  --  81.5  --  82.0 83.0  --   --   --   PLT 270  < > 262  < > 337 397 319 190 199  < > = values in this interval not displayed. No results for input(s): CHOL, LDLCALC, TRIG in the last 8760 hours.  Invalid input(s): HCL No results found for: MICROALBUR Lab Results  Component Value Date   TSH 6.29 (A) 08/25/2016   No results found for: HGBA1C No results found for: CHOL, HDL, LDLCALC, LDLDIRECT, TRIG, CHOLHDL  Significant Diagnostic Results in last 30 days:  No results found.  Assessment and Plan  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HCC) Bp is mod controlled today but on 3 prior readings was well controlled so will monitor for now;plan to cont coreg 3.125 mg BID  Coronary atherosclerosis of native coronary artery No reports of CP or use of SL NTG;plan to cont imdur 30 mg daily, coreg 3.125 mg BID ,ASA 81 mg daily and statin  A-fib (HCC) No problems with rate;stable;plan to cont coreg 3.125 BID for rate and ASA 81 mg daily as prophylaxis     Cindy Creamer D. [name], MD

## 2016-10-31 ENCOUNTER — Encounter: Payer: Self-pay | Admitting: Internal Medicine

## 2016-10-31 NOTE — Assessment & Plan Note (Deleted)
No problems with rate;stable;plan to cont coreg 3.125 BID for rate and ASA 81 mg daily as prophylaxis

## 2016-10-31 NOTE — Assessment & Plan Note (Signed)
No reports of CP or use of SL NTG;plan to cont imdur 30 mg daily, coreg 3.125 mg BID ,ASA 81 mg daily and statin

## 2016-10-31 NOTE — Assessment & Plan Note (Signed)
Bp is mod controlled today but on 3 prior readings was well controlled so will monitor for now;plan to cont coreg 3.125 mg BID

## 2016-10-31 NOTE — Assessment & Plan Note (Signed)
No problems with rate;stable;plan to cont coreg 3.125 BID for rate and ASA 81 mg daily as prophylaxis

## 2016-11-25 ENCOUNTER — Non-Acute Institutional Stay (SKILLED_NURSING_FACILITY): Payer: Medicare Other | Admitting: Internal Medicine

## 2016-11-25 ENCOUNTER — Encounter: Payer: Self-pay | Admitting: Internal Medicine

## 2016-11-25 DIAGNOSIS — F0281 Dementia in other diseases classified elsewhere with behavioral disturbance: Secondary | ICD-10-CM

## 2016-11-25 DIAGNOSIS — G301 Alzheimer's disease with late onset: Secondary | ICD-10-CM | POA: Diagnosis not present

## 2016-11-25 DIAGNOSIS — J4521 Mild intermittent asthma with (acute) exacerbation: Secondary | ICD-10-CM | POA: Diagnosis not present

## 2016-11-25 DIAGNOSIS — F419 Anxiety disorder, unspecified: Secondary | ICD-10-CM

## 2016-11-25 NOTE — Progress Notes (Signed)
Location:  [place] Room Number: [phone] Place of Service:  SNF [phone])  Noah Delaine. [name], MD  Patient Care Team: [name], MD as PCP - General [facility] Medicine)  Extended Emergency Contact Information Primary Emergency Contact: Tyner,[name] Address: [address] [place], [place] [address] [name] of [place] Phone: [phone] Work Phone: [phone] Relation: Daughter Secondary Emergency Contact: [place] of [place] Phone: [phone] Relation: Niece    Allergies: Patient has no known allergies.  Chief Complaint  Patient presents with  . Medical Management of Chronic Issues    Routine Visit    HPI: Patient is 75 y.o. female who is being seen for routine issues of anxiety,  asthma and dementia.  Past Medical History:  Diagnosis Date  . Angina pectoris ([place])   . Anxiety   . Asthma   . Barrett's esophagus   . Bronchitis   . 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 Chest pain   . Coronary artery disease    right carotid occlusion   . Depression   . Dysrhythmia    atrial fibrillation  . GERD (gastroesophageal reflux disease)   . Hyperlipidemia   . Hypertension   . Recurrent upper respiratory infection (URI)   . Stroke [facility])    2007, after left endarterectomy    Past Surgical History:  Procedure Laterality Date  . ABDOMINAL HYSTERECTOMY    . BALLOON DILATION  10/18/2012   Procedure: BALLOON DILATION;  Surgeon: Lear Ng, MD;  [facility];  Service: Endoscopy;  Laterality: N/A;  . CARDIAC CATHETERIZATION    . CAROTID ENDARTERECTOMY     left, complicated by stroke  . CHOLECYSTECTOMY     incidental at time of colectomy  . COLONOSCOPY  12/01/2011   Procedure: COLONOSCOPY;  Surgeon: Lear Ng, MD;  [facility];  Service: Endoscopy;  Laterality: N/A;  . COLONOSCOPY  10/18/2012   Procedure: COLONOSCOPY;  Surgeon: Lear Ng, MD;  [facility];  Service: Endoscopy;  Laterality: N/A;  colonic dilation  . ESOPHAGOGASTRODUODENOSCOPY  12/01/2011   Procedure: ESOPHAGOGASTRODUODENOSCOPY (EGD);  Surgeon: Lear Ng, MD;  [facility];  Service: Endoscopy;  Laterality: N/A;  . HOT HEMOSTASIS  12/01/2011   Procedure: HOT HEMOSTASIS (ARGON PLASMA COAGULATION/BICAP);  Surgeon: Lear Ng, MD;  Location[facility];  Service: Endoscopy;  Laterality: N/A;  . 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 Always use your most recent med list.          acetaminophen 325 MG tablet Commonly known as:  TYLENOL Take 2 tablets (650 mg total) by mouth every 6 (six) hours as needed for mild pain (or Fever >/= 101).   albuterol 108 (90 Base) MCG/ACT inhaler Commonly known as:  PROVENTIL HFA;VENTOLIN HFA Inhale 2 puffs into the lungs every 6 (six) hours as needed for shortness of breath.   alendronate 70 MG tablet Commonly known as:  FOSAMAX Take 70 mg by mouth every Monday. Take with a full glass of water on an empty stomach.   aspirin EC 81 MG tablet Take 81 mg by mouth daily.   BAZA EX Apply 1 application topically 2 (two) times daily  as needed (affected areas on buttocks/sacral.).   budesonide-formoterol 160-4.5 MCG/ACT inhaler Commonly known as:  SYMBICORT Inhale 2 puffs into the lungs 2 (two) times daily.   carvedilol 3.125 MG tablet Commonly known as:  COREG Take 1 tablet (3.125 mg total) by mouth 2 (two) times daily with a meal.   ENSURE Take 237 mLs by mouth 3 (three) times daily between meals.   NUTRITIONAL SUPPLEMENT PO 4 oz of Medpass by mouth twice daily   ferrous sulfate 325 (65 FE) MG tablet Take 325 mg by mouth daily with breakfast.   fluticasone 50 MCG/ACT nasal spray Commonly known as:  FLONASE Place 1 spray into both  nostrils daily.   guaifenesin 100 MG/5ML syrup Commonly known as:  ROBITUSSIN Take 200 mg by mouth every 6 (six) hours as needed for cough.   ipratropium-albuterol 0.5-2.5 (3) MG/3ML Soln Commonly known as:  DUONEB Take 3 mLs by nebulization every 4 (four) hours as needed.   isosorbide mononitrate 30 MG 24 hr tablet Commonly known as:  IMDUR Take 30 mg by mouth daily.   levothyroxine 25 MCG tablet Commonly known as:  SYNTHROID, LEVOTHROID Take 25 mcg by mouth daily before breakfast.   loratadine 10 MG tablet Commonly known as:  CLARITIN Take 10 mg by mouth daily.   Melatonin 3 MG Tabs Take 1 tablet by mouth at bedtime.   mirtazapine 15 MG tablet Commonly known as:  REMERON Take 7.5 mg by mouth at bedtime. 1/2 TAB   nitroGLYCERIN 0.4 MG SL tablet Commonly known as:  NITROSTAT Place 0.4 mg under the tongue every 5 (five) minutes as needed for chest pain.   omeprazole 40 MG capsule Commonly known as:  PRILOSEC Take 40 mg by mouth daily.   polyethylene glycol packet Commonly known as:  MIRALAX / GLYCOLAX Take 17 g by mouth daily as needed.   QUEtiapine 50 MG tablet Commonly known as:  SEROQUEL Take 50 mg by mouth at bedtime.   Rivaroxaban 15 MG Tabs tablet Commonly known as:  XARELTO Take 15 mg by mouth daily.   sertraline 50 MG tablet Commonly known as:  ZOLOFT Take 50 mg by mouth at bedtime.   simvastatin 10 MG tablet Commonly known as:  ZOCOR Take 10 mg by mouth at bedtime.   sodium chloride 0.65 % nasal spray Commonly known as:  OCEAN Place 1 spray into the nose every 4 (four) hours as needed for congestion. Into both nares   tiotropium 18 MCG inhalation capsule Commonly known as:  SPIRIVA Place 18 mcg into inhaler and inhale daily.   traZODone 150 MG tablet Commonly known as:  DESYREL Take 50 mg by mouth at bedtime.   UNABLE TO FIND Mineral oil extra heavy : Administer 1 drop to the right ear every Monday.   UNABLE TO FIND Ativan Gel 0.5 mg/ml  Apply 1 ml topically to skin daily and can also apply three times daily as needed for anxiety   Vitamin D3 1000 units Caps Take 1 capsule by mouth daily.       No orders of the defined types were placed in this encounter.   Immunization History  Administered Date(s) Administered  . Influenza-Unspecified 08/26/2016  . PPD Test 08/16/2016  . Pneumococcal Polysaccharide-23 01/08/2012    Social History  Substance Use Topics  . Smoking status: Former Smoker    Quit date: 04/09/2003  . Smokeless tobacco: Never Used  . Alcohol use No    Review of Systems  DATA OBTAINED: from patient  GENERAL:  no fevers, fatigue, appetite changes SKIN: No itching, rash HEENT: No complaint RESPIRATORY: No cough, wheezing, SOB CARDIAC: No chest pain, palpitations, lower extremity edema  GI: No abdominal pain, No N/V/D or constipation, No heartburn or reflux  GU: No dysuria, frequency or urgency, or incontinence  MUSCULOSKELETAL: No unrelieved bone/joint pain NEUROLOGIC: No headache, dizziness  PSYCHIATRIC: No overt anxiety or sadness  Vitals:   11/25/16 0949  BP: 97/63  Pulse: 65  Resp: 16  Temp: 98.3 F (36.8 C)   Body mass index is 25.78 kg/m. Physical Exam  GENERAL APPEARANCE: Alert, conversant, No acute distress  SKIN: No diaphoresis rash HEENT: Unremarkable RESPIRATORY: Breathing is even, unlabored. Lung sounds are clear   CARDIOVASCULAR: Heart RRR no murmurs, rubs or gallops. No peripheral edema  GASTROINTESTINAL: Abdomen is soft, non-tender, not distended w/ normal bowel sounds.  GENITOURINARY: Bladder non tender, not distended  MUSCULOSKELETAL: No abnormal joints or musculature NEUROLOGIC: Cranial nerves 2-12 grossly intact. Moves all extremities PSYCHIATRIC: Mood and affect less ornery than usual,, no behavioral issues  Patient Active Problem List   Diagnosis Date Noted  . Hypothyroidism 10/03/2016  . Paranoia ([place]) 10/03/2016  . LOC (loss of consciousness) ([place])  09/02/2016  . 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 Dementia with behavioral disturbance 09/02/2016  . Asthma with acute exacerbation 08/21/2016  . Atrial fibrillation with RVR ([place]) 08/21/2016  . Elevated INR 08/21/2016  . Acute on chronic systolic CHF (congestive heart failure) ([place]) 08/21/2016  . Hyperlipidemia 08/21/2016  . Electrolyte abnormality 08/21/2016  . HCAP (healthcare-associated pneumonia) 08/10/2016  . Sepsis ([place]) 08/09/2016  . Coronary atherosclerosis of native coronary artery 01/21/2014  . Obesity 01/17/2014  . Abdominal pain, generalized 10/18/2012  . GERD (gastroesophageal reflux disease)   . Depression   . Anxiety   . 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[place])   . Stroke ([place])   . Chest pain 01/07/2012  . Asthma 01/07/2012  . A-fib ([place]) 01/07/2012  . 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 Anemia 01/07/2012  . ANEMIA 07/29/2010  . BARRETTS ESOPHAGUS 06/04/2010    CMP     Component Value Date/Time   NA 140 09/29/2016   K 4.1 09/29/2016   CL 101 08/12/2016 0527   CO2 26 08/12/2016 0527   GLUCOSE 123 (H) 08/12/2016 0527   BUN 16 09/29/2016   CREATININE 1.1 09/29/2016   CREATININE 0.88 08/12/2016 0527   CALCIUM 10.0 08/12/2016 0527   PROT 5.9 (L) 08/12/2016 0527   ALBUMIN 2.7 (L) 08/12/2016 0527   AST 21 09/29/2016   ALT 19 09/29/2016   ALKPHOS 61 09/29/2016   BILITOT 1.0 08/12/2016 0527   GFRNONAA >60 08/12/2016 0527   GFRAA >60 08/12/2016 0527    Recent Labs  08/09/16 2029  08/10/16 0613  08/12/16 0527  08/27/16 09/02/16 09/29/16  NA 132*  < > 132*  < > 134*  < > 143 138 140  K 3.3*  < > 4.0  --  4.7  < > 3.9 4.2 4.1  CL 98*  --  100*  --  101  --   --   --   --   CO2 24  --  21*  --  26  --   --   --   --   GLUCOSE 96  --  150*  --  123*  --   --   --   --   BUN 23*  < > 23*  < >  29*  < > 13 18 16   CREATININE 0.96  < > 0.83  < > 0.88  < > 0.8 1.0 1.1  CALCIUM 10.1  --  9.9  --  10.0  --    --   --   --   < > = values in this interval not displayed.  Recent Labs  08/09/16 2029 08/12/16 0527 08/25/16 09/29/16  AST 100* 110* 21 21  ALT 80* 127* 25 19  ALKPHOS 93 88 61 61  BILITOT 1.2 1.0  --   --   PROT 6.6 5.9*  --   --   ALBUMIN 3.1* 2.7*  --   --     Recent Labs  08/09/16 2029  08/10/16 0613  08/12/16 0527 08/15/16 1053  08/27/16 09/02/16 09/29/16  WBC 15.8*  < > 17.5*  < > 23.0* 16.9*  < > 5.6 5.8 7.1  NEUTROABS 14.1*  --   --   --  20.5*  --   --   --   --   --   HGB 10.6*  < > 10.9*  < > 9.5* 9.9*  < > 10.2* 11.5* 13.4  HCT 32.3*  < > 33.0*  < > 29.1* 30.7*  < > 32* 35* 42  MCV 81.2  --  81.5  --  82.0 83.0  --   --   --   --   PLT 270  < > 262  < > 337 397  < > 190 199 189  < > = values in this interval not displayed.  Recent Labs  09/29/16  CHOL 172  LDLCALC 73  TRIG 128   Vitamin B12 1820 Vitamin D,  40  No results found for: [facility] Lab Results  Component Value Date   TSH 9.47 (A) 09/29/2016   Lab Results  Component Value Date   HGBA1C 4.8 09/29/2016   Lab Results  Component Value Date   CHOL 172 09/29/2016   HDL 73 (A) 09/29/2016   LDLCALC 73 09/29/2016   TRIG 128 09/29/2016    Significant Diagnostic Results in last 30 days:  No results found.  Assessment and Plan  Anxiety Pt doing well off anxiolytics and on zoloft  50 mg nightly  Asthma with acute exacerbation No reported exacerbations';plan to cont scheduled spiriva and symbicort  Dementia with behavioral disturbance Chronic and stable and on no mementia specific meds; is on multiple meds for mood disorder    Noah Delaine. [name], MD

## 2016-12-05 ENCOUNTER — Encounter: Payer: Self-pay | Admitting: Internal Medicine

## 2016-12-05 NOTE — Assessment & Plan Note (Signed)
Chronic and stable and on no mementia specific meds; is on multiple meds for mood disorder

## 2016-12-05 NOTE — Assessment & Plan Note (Signed)
No reported exacerbations';plan to cont scheduled spiriva and symbicort

## 2016-12-05 NOTE — Assessment & Plan Note (Signed)
Pt doing well off anxiolytics and on zoloft  50 mg nightly

## 2016-12-09 ENCOUNTER — Encounter: Payer: Self-pay | Admitting: Internal Medicine

## 2016-12-09 NOTE — Progress Notes (Signed)
Opened in error; Disregard.

## 2016-12-27 ENCOUNTER — Encounter: Payer: Self-pay | Admitting: Internal Medicine

## 2016-12-27 ENCOUNTER — Non-Acute Institutional Stay (SKILLED_NURSING_FACILITY): Payer: Medicare Other | Admitting: Internal Medicine

## 2016-12-27 DIAGNOSIS — K219 Gastro-esophageal reflux disease without esophagitis: Secondary | ICD-10-CM

## 2016-12-27 DIAGNOSIS — F22 Delusional disorders: Secondary | ICD-10-CM

## 2016-12-27 DIAGNOSIS — I6930 Unspecified sequelae of cerebral infarction: Secondary | ICD-10-CM | POA: Diagnosis not present

## 2016-12-27 NOTE — Progress Notes (Signed)
Location:  [facility] Room Number: [phone] Place of Service:  SNF [phone])  Noah Delaine. [name], MD  Patient Care Team: [name], MD as PCP - General [facility])  Extended Emergency Contact Information Primary Emergency Contact: Tyner,[name] Address: [address] [place], [place] [address] [name] of [place] Phone: [phone] Work Phone: [phone] Relation: Daughter Secondary Emergency Contact: [place] of [place] Phone: [phone] Relation: Niece    Allergies: Patient has no known allergies.  Chief Complaint  Patient presents with  . Medical Management of Chronic Issues    Routine Visit    HPI: Patient is 76 y.o. female who is being seen for routine issues of paranoia, hx CVA and GERD.  Past Medical History:  Diagnosis Date  . Angina pectoris ([place])   . Anxiety   . Asthma   . Barrett's esophagus   . Bronchitis   . 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 Chest pain   . Coronary artery disease    right carotid occlusion   . Depression   . Dysrhythmia    atrial fibrillation  . GERD (gastroesophageal reflux disease)   . Hyperlipidemia   . Hypertension   . Recurrent upper respiratory infection (URI)   . Stroke [facility])    2007, after left endarterectomy    Past Surgical History:  Procedure Laterality Date  . ABDOMINAL HYSTERECTOMY    . BALLOON DILATION  10/18/2012   Procedure: BALLOON DILATION;  Surgeon: Lear Ng, MD;  [facility]: [facility] ENDOSCOPY;  Service: Endoscopy;  Laterality: N/A;  . CARDIAC CATHETERIZATION    . CAROTID ENDARTERECTOMY     left, complicated by stroke  . CHOLECYSTECTOMY     incidental at time of colectomy  . COLONOSCOPY  12/01/2011   Procedure: COLONOSCOPY;  Surgeon: Lear Ng, MD;  [facility];  Service: Endoscopy;  Laterality: N/A;  . COLONOSCOPY  10/18/2012   Procedure: COLONOSCOPY;  Surgeon: Lear Ng, MD;  [facility]OS[facility];  Service: Endoscopy;  Laterality: N/A;  colonic dilation  . ESOPHAGOGASTRODUODENOSCOPY  12/01/2011   Procedure: ESOPHAGOGASTRODUODENOSCOPY (EGD);  Surgeon: Lear Ng, MD;  Location: [facility];  Service: Endoscopy;  Laterality: N/A;  . HOT HEMOSTASIS  12/01/2011   Procedure: HOT HEMOSTASIS (ARGON PLASMA COAGULATION/BICAP);  Surgeon: Lear Ng, MD;  Location: [facility];  Service: Endoscopy;  Laterality: N/A;  . 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 Always use your most recent med list.          acetaminophen 325 MG tablet Commonly known as:  TYLENOL Take 2 tablets (650 mg total) by mouth every 6 (six) hours as needed for mild pain (or Fever >/= 101).   albuterol 108 (90 Base) MCG/ACT inhaler Commonly known as:  PROVENTIL HFA;VENTOLIN HFA Inhale 2 puffs into the lungs every 6 (six) hours as needed for shortness of breath.   alendronate 70 MG tablet Commonly known as:  FOSAMAX Take 70 mg by mouth every Monday. Take with a full glass of water on an empty stomach.   aspirin EC 81 MG tablet Take 81 mg by mouth daily.   BAZA EX Apply 1 application topically 2 (two) times daily  as needed (affected areas on buttocks/sacral.).   budesonide-formoterol 160-4.5 MCG/ACT inhaler Commonly known as:  SYMBICORT Inhale 2 puffs into the lungs 2 (two) times daily.   carvedilol 3.125 MG tablet Commonly known as:  COREG Take 1 tablet (3.125 mg total) by mouth 2 (two) times daily with a meal.   ENSURE Take 237 mLs by mouth 3 (three) times daily between meals.   ferrous sulfate 325 (65 FE) MG tablet Take 325 mg by mouth daily with breakfast.   fluticasone 50 MCG/ACT nasal spray Commonly known as:  FLONASE Place 1 spray into both nostrils daily.   guaifenesin 100 MG/5ML syrup Commonly known as:   ROBITUSSIN Take 200 mg by mouth every 6 (six) hours as needed for cough.   ipratropium-albuterol 0.5-2.5 (3) MG/3ML Soln Commonly known as:  DUONEB Take 3 mLs by nebulization every 4 (four) hours as needed.   isosorbide mononitrate 30 MG 24 hr tablet Commonly known as:  IMDUR Take 30 mg by mouth daily.   levothyroxine 25 MCG tablet Commonly known as:  SYNTHROID, LEVOTHROID Take 25 mcg by mouth daily before breakfast.   loratadine 10 MG tablet Commonly known as:  CLARITIN Take 10 mg by mouth daily.   Melatonin 3 MG Tabs Take 1 tablet by mouth at bedtime.   mirtazapine 15 MG tablet Commonly known as:  REMERON Take 7.5 mg by mouth at bedtime. 1/2 TAB   nitroGLYCERIN 0.4 MG SL tablet Commonly known as:  NITROSTAT Place 0.4 mg under the tongue every 5 (five) minutes as needed for chest pain.   omeprazole 40 MG capsule Commonly known as:  PRILOSEC Take 40 mg by mouth daily.   polyethylene glycol packet Commonly known as:  MIRALAX / GLYCOLAX Take 17 g by mouth daily as needed.   QUEtiapine 50 MG tablet Commonly known as:  SEROQUEL Take 50 mg by mouth at bedtime.   Rivaroxaban 15 MG Tabs tablet Commonly known as:  XARELTO Take 15 mg by mouth daily.   sertraline 50 MG tablet Commonly known as:  ZOLOFT Take 50 mg by mouth at bedtime.   simvastatin 10 MG tablet Commonly known as:  ZOCOR Take 10 mg by mouth at bedtime.   sodium chloride 0.65 % nasal spray Commonly known as:  OCEAN Place 1 spray into the nose every 4 (four) hours as needed for congestion. Into both nares   tiotropium 18 MCG inhalation capsule Commonly known as:  SPIRIVA Place 18 mcg into inhaler and inhale daily.   traZODone 150 MG tablet Commonly known as:  DESYREL Take 50 mg by mouth at bedtime.   UNABLE TO FIND Ativan Gel 0.5 mg/ml Apply 1 ml topically to skin daily and can also apply three times daily as needed for anxiety   Vitamin D3 1000 units Caps Take 1 capsule by mouth daily.        No orders of the defined types were placed in this encounter.   Immunization History  Administered Date(s) Administered  . Influenza-Unspecified 08/26/2016  . PPD Test 08/16/2016  . Pneumococcal Polysaccharide-23 01/08/2012    Social History  Substance Use Topics  . Smoking status: Former Smoker    Quit date: 04/09/2003  . Smokeless tobacco: Never Used  . Alcohol use No    Review of Systems  DATA OBTAINED: from patient, nurse GENERAL:  no fevers, fatigue, appetite changes SKIN: No itching, rash HEENT: No complaint RESPIRATORY: No cough, wheezing, SOB CARDIAC: No chest pain, palpitations, lower extremity edema  GI: No abdominal  pain, No N/V/D or constipation, No heartburn or reflux  GU: No dysuria, frequency or urgency, or incontinence  MUSCULOSKELETAL: No unrelieved bone/joint pain NEUROLOGIC: No headache, dizziness  PSYCHIATRIC: No overt anxiety or sadness  Vitals:   12/27/16 1119  BP: 135/61  Pulse: (!) 53  Resp: 18  Temp: 97.5 F (36.4 C)   Body mass index is 25.12 kg/m. Physical Exam  GENERAL APPEARANCE: Alert, mod conversant, No acute distress  SKIN: No diaphoresis rash HEENT: Unremarkable RESPIRATORY: Breathing is even, unlabored. Lung sounds are clear   CARDIOVASCULAR: Heart RRR no murmurs, rubs or gallops. No peripheral edema  GASTROINTESTINAL: Abdomen is soft, non-tender, not distended w/ normal bowel sounds.  GENITOURINARY: Bladder non tender, not distended  MUSCULOSKELETAL: No abnormal joints or musculature NEUROLOGIC: Cranial nerves 2-12 grossly intact. Moves all extremities PSYCHIATRIC: Mood and affect appropriate to situation, no behavioral issues  Patient Active Problem List   Diagnosis Date Noted  . Hypothyroidism 10/03/2016  . Paranoia ([place]) 10/03/2016  . LOC (loss of consciousness) ([place]) 09/02/2016  . 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 Dementia with behavioral disturbance 09/02/2016  . Asthma with acute exacerbation  08/21/2016  . Atrial fibrillation with RVR ([place]) 08/21/2016  . Elevated INR 08/21/2016  . Acute on chronic systolic CHF (congestive heart failure) ([place]) 08/21/2016  . Hyperlipidemia 08/21/2016  . Electrolyte abnormality 08/21/2016  . HCAP (healthcare-associated pneumonia) 08/10/2016  . Sepsis ([place]) 08/09/2016  . Coronary atherosclerosis of native coronary artery 01/21/2014  . Obesity 01/17/2014  . Abdominal pain, generalized 10/18/2012  . GERD (gastroesophageal reflux disease)   . Depression   . Anxiety   . 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[place])   . Stroke ([place])   . Chest pain 01/07/2012  . Asthma 01/07/2012  . A-fib ([place]) 01/07/2012  . 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 Anemia 01/07/2012  . ANEMIA 07/29/2010  . BARRETTS ESOPHAGUS 06/04/2010    CMP     Component Value Date/Time   NA 140 09/29/2016   K 4.1 09/29/2016   CL 101 08/12/2016 0527   CO2 26 08/12/2016 0527   GLUCOSE 123 (H) 08/12/2016 0527   BUN 16 09/29/2016   CREATININE 1.1 09/29/2016   CREATININE 0.88 08/12/2016 0527   CALCIUM 10.0 08/12/2016 0527   PROT 5.9 (L) 08/12/2016 0527   ALBUMIN 2.7 (L) 08/12/2016 0527   AST 21 09/29/2016   ALT 19 09/29/2016   ALKPHOS 61 09/29/2016   BILITOT 1.0 08/12/2016 0527   GFRNONAA >60 08/12/2016 0527   GFRAA >60 08/12/2016 0527    Recent Labs  08/09/16 2029  08/10/16 0613  08/12/16 0527  08/27/16 09/02/16 09/29/16  NA 132*  < > 132*  < > 134*  < > 143 138 140  K 3.3*  < > 4.0  --  4.7  < > 3.9 4.2 4.1  CL 98*  --  100*  --  101  --   --   --   --   CO2 24  --  21*  --  26  --   --   --   --   GLUCOSE 96  --  150*  --  123*  --   --   --   --   BUN 23*  < > 23*  < > 29*  < > 13 18 16   CREATININE 0.96  < > 0.83  < > 0.88  < > 0.8 1.0 1.1  CALCIUM 10.1  --  9.9  --  10.0  --   --   --   --   < > = values in this interval not displayed.  Recent Labs  08/09/16 2029 08/12/16 0527 08/25/16 09/29/16  AST 100* 110* 21  21  ALT 80* 127* 25 19  ALKPHOS 93 88 61 61  BILITOT 1.2 1.0  --   --   PROT 6.6 5.9*  --   --   ALBUMIN 3.1* 2.7*  --   --     Recent Labs  08/09/16 2029  08/10/16 0613  08/12/16 0527 08/15/16 1053  08/27/16 09/02/16 09/29/16  WBC 15.8*  < > 17.5*  < > 23.0* 16.9*  < > 5.6 5.8 7.1  NEUTROABS 14.1*  --   --   --  20.5*  --   --   --   --   --   HGB 10.6*  < > 10.9*  < > 9.5* 9.9*  < > 10.2* 11.5* 13.4  HCT 32.3*  < > 33.0*  < > 29.1* 30.7*  < > 32* 35* 42  MCV 81.2  --  81.5  --  82.0 83.0  --   --   --   --   PLT 270  < > 262  < > 337 397  < > 190 199 189  < > = values in this interval not displayed.  Recent Labs  09/29/16  CHOL 172  LDLCALC 73  TRIG 128   No results found for: [facility] Lab Results  Component Value Date   TSH 9.47 (A) 09/29/2016   Lab Results  Component Value Date   HGBA1C 4.8 09/29/2016   Lab Results  Component Value Date   CHOL 172 09/29/2016   HDL 73 (A) 09/29/2016   LDLCALC 73 09/29/2016   TRIG 128 09/29/2016    Significant Diagnostic Results in last 30 days:  No results found.  Assessment and Plan  Paranoia ([place]) Much improved;pt overall more pleasant and happy; cont seroquel 50 mg qHS  History of CVA with residual deficit Stable; occurred after L endartectomy in 2007;cont ASA 81 mg daily ; pt also on xarelto for AF  GERD (gastroesophageal reflux disease) No sx or signs of refluxl plan to cont protonix 40 mg daily     Tinie Mcgloin D. [name], MD

## 2016-12-30 ENCOUNTER — Non-Acute Institutional Stay (SKILLED_NURSING_FACILITY): Payer: Medicare Other | Admitting: Internal Medicine

## 2016-12-30 DIAGNOSIS — Z20828 Contact with and (suspected) exposure to other viral communicable diseases: Secondary | ICD-10-CM | POA: Diagnosis not present

## 2017-01-09 ENCOUNTER — Encounter: Payer: Self-pay | Admitting: Internal Medicine

## 2017-01-09 NOTE — Assessment & Plan Note (Signed)
Stable; occurred after L endartectomy in 2007;cont ASA 81 mg daily ; pt also on xarelto for AF

## 2017-01-09 NOTE — Assessment & Plan Note (Signed)
No sx or signs of refluxl plan to cont protonix 40 mg daily

## 2017-01-09 NOTE — Assessment & Plan Note (Signed)
Much improved;pt overall more pleasant and happy; cont seroquel 50 mg qHS

## 2017-01-10 ENCOUNTER — Non-Acute Institutional Stay (SKILLED_NURSING_FACILITY): Payer: Medicare Other | Admitting: Internal Medicine

## 2017-01-10 ENCOUNTER — Encounter: Payer: Self-pay | Admitting: Internal Medicine

## 2017-01-10 DIAGNOSIS — W19XXXA Unspecified fall, initial encounter: Secondary | ICD-10-CM | POA: Diagnosis not present

## 2017-01-10 DIAGNOSIS — S0511XA Contusion of eyeball and orbital tissues, right eye, initial encounter: Secondary | ICD-10-CM

## 2017-01-10 NOTE — Progress Notes (Signed)
Location:  [facility] Room Number: [phone] Place of Service:  SNF [phone])  Cindy [name]. Cindy Coil, MD  Patient Care Team: [name], MD as PCP - General [facility] Medicine)  Extended Emergency Contact Information Primary Emergency Contact: Tyner,[name] Address: [address] [place], [place] [address] [name] of [place] Phone: [phone] Work Phone: [phone] Relation: Daughter Secondary Emergency Contact: [place] of [place] Phone: [phone] Relation: Niece    Allergies: Patient has no known allergies.  Chief Complaint  Patient presents with  . Acute Visit    Acute     HPI: Patient is 76 y.o. female who nursing has asked me to see. Pt had a fall several days ago and has swelling and bruising R eye. Pt denies pain or problems with vision.  Past Medical History:  Diagnosis Date  . Angina pectoris ([place])   . Anxiety   . Asthma   . Barrett's esophagus   . Bronchitis   . 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 Chest pain   . Coronary artery disease    right carotid occlusion   . Depression   . Dysrhythmia    atrial fibrillation  . GERD (gastroesophageal reflux disease)   . Hyperlipidemia   . Hypertension   . Recurrent upper respiratory infection (URI)   . Stroke [facility])    2007, after left endarterectomy    Past Surgical History:  Procedure Laterality Date  . ABDOMINAL HYSTERECTOMY    . BALLOON DILATION  10/18/2012   Procedure: BALLOON DILATION;  Surgeon: Lear Ng, MD;  [facility];  Service: Endoscopy;  Laterality: N/A;  . CARDIAC CATHETERIZATION    . CAROTID ENDARTERECTOMY     left, complicated by stroke  . CHOLECYSTECTOMY     incidental at time of colectomy  . COLONOSCOPY  12/01/2011   Procedure: COLONOSCOPY;  Surgeon: Lear Ng, MD;  [facility];  Service: Endoscopy;  Laterality: N/A;  . COLONOSCOPY  10/18/2012   Procedure: COLONOSCOPY;  Surgeon:  Lear Ng, MD;  [facility];  Service: Endoscopy;  Laterality: N/A;  colonic dilation  . ESOPHAGOGASTRODUODENOSCOPY  12/01/2011   Procedure: ESOPHAGOGASTRODUODENOSCOPY (EGD);  Surgeon: Lear Ng, MD;  [facility];  Service: Endoscopy;  Laterality: N/A;  . HOT HEMOSTASIS  12/01/2011   Procedure: HOT HEMOSTASIS (ARGON PLASMA COAGULATION/BICAP);  Surgeon: Lear Ng, MD;  Location[facility];  Service: Endoscopy;  Laterality: N/A;  . 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 Always use your most recent med list.          acetaminophen 325 MG tablet Commonly known as:  TYLENOL Take 2 tablets (650 mg total) by mouth every 6 (six) hours as needed for mild pain (or Fever >/= 101).   albuterol 108 (90 Base) MCG/ACT inhaler Commonly known as:  PROVENTIL HFA;VENTOLIN HFA Inhale 2 puffs into the lungs every 6 (six) hours as needed for shortness of breath.   alendronate 70 MG tablet Commonly known as:  FOSAMAX Take 70 mg by mouth every Monday. Take with a full glass of water on an empty stomach.   aspirin EC 81 MG tablet Take 81 mg by mouth  daily.   BAZA EX Apply 1 application topically 2 (two) times daily as needed (affected areas on buttocks/sacral.).   budesonide-formoterol 160-4.5 MCG/ACT inhaler Commonly known as:  SYMBICORT Inhale 2 puffs into the lungs 2 (two) times daily.   carvedilol 3.125 MG tablet Commonly known as:  COREG Take 1 tablet (3.125 mg total) by mouth 2 (two) times daily with a meal.   ENSURE Take 237 mLs by mouth 3 (three) times daily between meals.   ferrous sulfate 325 (65 FE) MG tablet Take 325 mg by mouth daily with breakfast.   fluticasone 50 MCG/ACT nasal spray Commonly known as:  FLONASE Place 1 spray into both nostrils daily.   guaifenesin 100  MG/5ML syrup Commonly known as:  ROBITUSSIN Take 200 mg by mouth every 6 (six) hours as needed for cough.   ipratropium-albuterol 0.5-2.5 (3) MG/3ML Soln Commonly known as:  DUONEB Take 3 mLs by nebulization every 4 (four) hours as needed.   isosorbide mononitrate 30 MG 24 hr tablet Commonly known as:  IMDUR Take 30 mg by mouth daily.   levothyroxine 25 MCG tablet Commonly known as:  SYNTHROID, LEVOTHROID Take 25 mcg by mouth daily before breakfast.   loratadine 10 MG tablet Commonly known as:  CLARITIN Take 10 mg by mouth daily.   Melatonin 3 MG Tabs Take 1 tablet by mouth at bedtime.   mirtazapine 15 MG tablet Commonly known as:  REMERON Take 7.5 mg by mouth at bedtime. 1/2 TAB   nitroGLYCERIN 0.4 MG SL tablet Commonly known as:  NITROSTAT Place 0.4 mg under the tongue every 5 (five) minutes as needed for chest pain.   omeprazole 40 MG capsule Commonly known as:  PRILOSEC Take 40 mg by mouth daily.   polyethylene glycol packet Commonly known as:  MIRALAX / GLYCOLAX Take 17 g by mouth daily as needed.   QUEtiapine 50 MG tablet Commonly known as:  SEROQUEL Take 50 mg by mouth at bedtime.   Rivaroxaban 15 MG Tabs tablet Commonly known as:  XARELTO Take 15 mg by mouth daily.   sertraline 50 MG tablet Commonly known as:  ZOLOFT Take 50 mg by mouth at bedtime.   simvastatin 10 MG tablet Commonly known as:  ZOCOR Take 10 mg by mouth at bedtime.   sodium chloride 0.65 % nasal spray Commonly known as:  OCEAN Place 1 spray into the nose every 4 (four) hours as needed for congestion. Into both nares   tiotropium 18 MCG inhalation capsule Commonly known as:  SPIRIVA Place 18 mcg into inhaler and inhale daily.   traZODone 150 MG tablet Commonly known as:  DESYREL Take 50 mg by mouth at bedtime.   UNABLE TO FIND Ativan Gel 0.5 mg/ml Apply 1 ml topically to skin daily and can also apply three times daily as needed for anxiety   Vitamin D3 1000 units Caps Take  1 capsule by mouth daily.       No orders of the defined types were placed in this encounter.   Immunization History  Administered Date(s) Administered  . Influenza-Unspecified 08/26/2016  . PPD Test 08/16/2016  . Pneumococcal Polysaccharide-23 01/08/2012    Social History  Substance Use Topics  . Smoking status: Former Smoker    Quit date: 04/09/2003  . Smokeless tobacco: Never Used  . Alcohol use No    Review of Systems  DATA OBTAINED: from patient GENERAL:  no fevers, fatigue, appetite changes SKIN: No itching, rash HEENT: No complaint, bruising RESPIRATORY: No cough, wheezing,  SOB CARDIAC: No chest pain, palpitations, lower extremity edema  GI: No abdominal pain, No N/V/D or constipation, No heartburn or reflux  GU: No dysuria, frequency or urgency, or incontinence  MUSCULOSKELETAL: No unrelieved bone/joint pain NEUROLOGIC: No headache, dizziness  PSYCHIATRIC: No overt anxiety or sadness  Vitals:   01/10/17 1327  BP: 109/73  Pulse: 66  Resp: 18  Temp: 97.5 F (36.4 C)   Body mass index is 25.12 kg/m. Physical Exam  GENERAL APPEARANCE: Alert, conversant, No acute distress  SKIN: No diaphoresis rash HEENT: large bruising R orbit, eyelids, upper and lower, no rbital bony tenderness, EOMI, conjunctiva is clear RESPIRATORY: Breathing is even, unlabored. Lung sounds are clear   CARDIOVASCULAR: Heart RRR no murmurs, rubs or gallops. No peripheral edema  GASTROINTESTINAL: Abdomen is soft, non-tender, not distended w/ normal bowel sounds.  GENITOURINARY: Bladder non tender, not distended  MUSCULOSKELETAL: No abnormal joints or musculature NEUROLOGIC: Cranial nerves 2-12 grossly intact. Moves all extremities PSYCHIATRIC: Mood and affect appropriate to situation with dementia, no behavioral issues  Patient Active Problem List   Diagnosis Date Noted  . Hypothyroidism 10/03/2016  . Paranoia ([place]) 10/03/2016  . LOC (loss of consciousness) ([place]) 09/02/2016  .  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 Dementia with behavioral disturbance 09/02/2016  . Asthma with acute exacerbation 08/21/2016  . Atrial fibrillation with RVR ([place]) 08/21/2016  . Elevated INR 08/21/2016  . Acute on chronic systolic CHF (congestive heart failure) ([place]) 08/21/2016  . Hyperlipidemia 08/21/2016  . Electrolyte abnormality 08/21/2016  . HCAP (healthcare-associated pneumonia) 08/10/2016  . Sepsis ([place]) 08/09/2016  . Coronary atherosclerosis of native coronary artery 01/21/2014  . Obesity 01/17/2014  . Abdominal pain, generalized 10/18/2012  . GERD (gastroesophageal reflux disease)   . Depression   . Anxiety   . 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[place])   . Stroke ([place])   . Chest pain 01/07/2012  . Asthma 01/07/2012  . A-fib ([place]) 01/07/2012  . 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 Anemia 01/07/2012  . ANEMIA 07/29/2010  . BARRETTS ESOPHAGUS 06/04/2010    CMP     Component Value Date/Time   NA 140 09/29/2016   K 4.1 09/29/2016   CL 101 08/12/2016 0527   CO2 26 08/12/2016 0527   GLUCOSE 123 (H) 08/12/2016 0527   BUN 16 09/29/2016   CREATININE 1.1 09/29/2016   CREATININE 0.88 08/12/2016 0527   CALCIUM 10.0 08/12/2016 0527   PROT 5.9 (L) 08/12/2016 0527   ALBUMIN 2.7 (L) 08/12/2016 0527   AST 21 09/29/2016   ALT 19 09/29/2016   ALKPHOS 61 09/29/2016   BILITOT 1.0 08/12/2016 0527   GFRNONAA >60 08/12/2016 0527   GFRAA >60 08/12/2016 0527    Recent Labs  08/09/16 2029  08/10/16 0613  08/12/16 0527  08/27/16 09/02/16 09/29/16  NA 132*  < > 132*  < > 134*  < > 143 138 140  K 3.3*  < > 4.0  --  4.7  < > 3.9 4.2 4.1  CL 98*  --  100*  --  101  --   --   --   --   CO2 24  --  21*  --  26  --   --   --   --   GLUCOSE 96  --  150*  --  123*  --   --   --   --   BUN 23*  < > 23*  < >  29*  < > 13 18 16   CREATININE 0.96  < > 0.83  < > 0.88  < > 0.8 1.0 1.1  CALCIUM 10.1  --  9.9  --  10.0  --   --   --   --     < > = values in this interval not displayed.  Recent Labs  08/09/16 2029 08/12/16 0527 08/25/16 09/29/16  AST 100* 110* 21 21  ALT 80* 127* 25 19  ALKPHOS 93 88 61 61  BILITOT 1.2 1.0  --   --   PROT 6.6 5.9*  --   --   ALBUMIN 3.1* 2.7*  --   --     Recent Labs  08/09/16 2029  08/10/16 0613  08/12/16 0527 08/15/16 1053  08/27/16 09/02/16 09/29/16  WBC 15.8*  < > 17.5*  < > 23.0* 16.9*  < > 5.6 5.8 7.1  NEUTROABS 14.1*  --   --   --  20.5*  --   --   --   --   --   HGB 10.6*  < > 10.9*  < > 9.5* 9.9*  < > 10.2* 11.5* 13.4  HCT 32.3*  < > 33.0*  < > 29.1* 30.7*  < > 32* 35* 42  MCV 81.2  --  81.5  --  82.0 83.0  --   --   --   --   PLT 270  < > 262  < > 337 397  < > 190 199 189  < > = values in this interval not displayed.  Recent Labs  09/29/16  CHOL 172  LDLCALC 73  TRIG 128   No results found for: [facility] Lab Results  Component Value Date   TSH 9.47 (A) 09/29/2016   Lab Results  Component Value Date   HGBA1C 4.8 09/29/2016   Lab Results  Component Value Date   CHOL 172 09/29/2016   HDL 73 (A) 09/29/2016   LDLCALC 73 09/29/2016   TRIG 128 09/29/2016    Significant Diagnostic Results in last 30 days:  No results found.  Assessment and Plan  CONTUSION R EYE/FALL - SUPPORTIVE CARE; WILL TAKE SEVERAL WEEKS TO RESOLVE; TIME WILL RESOLVE ISSUE   Keslee Harrington D. Cindy Coil, MD

## 2017-01-11 ENCOUNTER — Non-Acute Institutional Stay (SKILLED_NURSING_FACILITY): Payer: Medicare Other | Admitting: Internal Medicine

## 2017-01-11 DIAGNOSIS — S0511XD Contusion of eyeball and orbital tissues, right eye, subsequent encounter: Secondary | ICD-10-CM | POA: Diagnosis not present

## 2017-01-11 DIAGNOSIS — H1131 Conjunctival hemorrhage, right eye: Secondary | ICD-10-CM

## 2017-01-16 ENCOUNTER — Encounter: Payer: Self-pay | Admitting: Internal Medicine

## 2017-01-16 NOTE — Progress Notes (Signed)
Location:  [place] [facility] of Service:  SNF ([phone]  [name], MD    Patient Care Team: [name], MD as PCP - [facility])  Extended Emergency Contact Information Primary Emergency Contact: Tyner,[name] Address: [address] [place], [place] [address] [name] of [place] Phone: [phone] Work Phone: [phone] Relation: Daughter Secondary Emergency Contact: [place] of [place] Phone: [phone] Relation: Niece    Allergies: Patient has no known allergies.  Chief Complaint  Patient presents with  . Acute Visit    HPI: Patient is 76 y.o. female who nursing asked me to see today because her eye is bright red. Pt was seen yesterday for bruising to eye after a fall. PT has no c/o today, denies eye pain.  Past Medical History:  Diagnosis Date  . Angina pectoris ([place])   . Anxiety   . Asthma   . Barrett's esophagus   . Bronchitis   . 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 Chest pain   . Coronary artery disease    right carotid occlusion   . Depression   . Dysrhythmia    atrial fibrillation  . GERD (gastroesophageal reflux disease)   . Hyperlipidemia   . Hypertension   . Recurrent upper respiratory infection (URI)   . Stroke [facility])    2007, after left endarterectomy    Past Surgical History:  Procedure Laterality Date  . ABDOMINAL HYSTERECTOMY    . BALLOON DILATION  10/18/2012   Procedure: BALLOON DILATION;  Surgeon: Lear Ng, MD;  [facility]: [facility];  Service: Endoscopy;  Laterality: N/A;  . CARDIAC CATHETERIZATION    . CAROTID ENDARTERECTOMY     left, complicated by stroke  . CHOLECYSTECTOMY     incidental at time of colectomy  . COLONOSCOPY  12/01/2011   Procedure: COLONOSCOPY;  Surgeon: Lear Ng, MD;  [facility];  Service: Endoscopy;  Laterality: N/A;  . COLONOSCOPY  10/18/2012   Procedure: COLONOSCOPY;  Surgeon: Lear Ng, MD;  [facility];  Service: Endoscopy;  Laterality: N/A;  colonic dilation  . ESOPHAGOGASTRODUODENOSCOPY  12/01/2011   Procedure: ESOPHAGOGASTRODUODENOSCOPY (EGD);  Surgeon: Lear Ng, MD;  Location: [facility];  Service: Endoscopy;  Laterality: N/A;  . HOT HEMOSTASIS  12/01/2011   Procedure: HOT HEMOSTASIS (ARGON PLASMA COAGULATION/BICAP);  Surgeon: Lear Ng, MD;  Location: [facility];  Service: Endoscopy;  Laterality: N/A;  . 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 Always use your most recent med list.          acetaminophen 325 MG tablet Commonly known as:  TYLENOL Take 2 tablets (650 mg total) by mouth every 6 (six) hours as needed for mild pain (or Fever >/= 101).   albuterol 108 (90 Base) MCG/ACT inhaler Commonly known as:  PROVENTIL HFA;VENTOLIN HFA Inhale 2 puffs into the lungs every 6 (six) hours as needed for shortness of breath.   alendronate 70 MG tablet Commonly known as:  FOSAMAX Take 70 mg by mouth every Monday. Take with a full glass of water on an empty stomach.   aspirin EC 81 MG tablet Take 81 mg by mouth daily.  BAZA EX Apply 1 application topically 2 (two) times daily as needed (affected areas on buttocks/sacral.).   budesonide-formoterol 160-4.5 MCG/ACT inhaler Commonly known as:  SYMBICORT Inhale 2 puffs into the lungs 2 (two) times daily.   carvedilol 3.125 MG tablet Commonly known as:  COREG Take 1 tablet (3.125 mg total) by mouth 2 (two) times daily with a meal.   ENSURE Take 237 mLs by mouth 3 (three) times daily between meals.   ferrous sulfate 325 (65 FE) MG tablet Take 325 mg by mouth daily with breakfast.   fluticasone 50 MCG/ACT nasal spray Commonly known as:  FLONASE Place 1 spray into both nostrils daily.   guaifenesin 100 MG/5ML  syrup Commonly known as:  ROBITUSSIN Take 200 mg by mouth every 6 (six) hours as needed for cough.   ipratropium-albuterol 0.5-2.5 (3) MG/3ML Soln Commonly known as:  DUONEB Take 3 mLs by nebulization every 4 (four) hours as needed.   isosorbide mononitrate 30 MG 24 hr tablet Commonly known as:  IMDUR Take 30 mg by mouth daily.   levothyroxine 25 MCG tablet Commonly known as:  SYNTHROID, LEVOTHROID Take 25 mcg by mouth daily before breakfast.   loratadine 10 MG tablet Commonly known as:  CLARITIN Take 10 mg by mouth daily.   Melatonin 3 MG Tabs Take 1 tablet by mouth at bedtime.   mirtazapine 15 MG tablet Commonly known as:  REMERON Take 7.5 mg by mouth at bedtime. 1/2 TAB   nitroGLYCERIN 0.4 MG SL tablet Commonly known as:  NITROSTAT Place 0.4 mg under the tongue every 5 (five) minutes as needed for chest pain.   omeprazole 40 MG capsule Commonly known as:  PRILOSEC Take 40 mg by mouth daily.   polyethylene glycol packet Commonly known as:  MIRALAX / GLYCOLAX Take 17 g by mouth daily as needed.   QUEtiapine 50 MG tablet Commonly known as:  SEROQUEL Take 50 mg by mouth at bedtime.   Rivaroxaban 15 MG Tabs tablet Commonly known as:  XARELTO Take 15 mg by mouth daily.   sertraline 50 MG tablet Commonly known as:  ZOLOFT Take 50 mg by mouth at bedtime.   simvastatin 10 MG tablet Commonly known as:  ZOCOR Take 10 mg by mouth at bedtime.   sodium chloride 0.65 % nasal spray Commonly known as:  OCEAN Place 1 spray into the nose every 4 (four) hours as needed for congestion. Into both nares   tiotropium 18 MCG inhalation capsule Commonly known as:  SPIRIVA Place 18 mcg into inhaler and inhale daily.   traZODone 150 MG tablet Commonly known as:  DESYREL Take 50 mg by mouth at bedtime.   UNABLE TO FIND Ativan Gel 0.5 mg/ml Apply 1 ml topically to skin daily and can also apply three times daily as needed for anxiety   Vitamin D3 1000 units Caps Take 1  capsule by mouth daily.       No orders of the defined types were placed in this encounter.   Immunization History  Administered Date(s) Administered  . Influenza-Unspecified 08/26/2016  . PPD Test 08/16/2016  . Pneumococcal Polysaccharide-23 01/08/2012    Social History  Substance Use Topics  . Smoking status: Former Smoker    Quit date: 04/09/2003  . Smokeless tobacco: Never Used  . Alcohol use No    Review of Systems  DATA OBTAINED: from patient, nurse - as per HPI GENERAL:  no fevers, fatigue, appetite changes SKIN: No itching, rash HEENT: No complaint, no pain RESPIRATORY:  No cough, wheezing, SOB CARDIAC: No chest pain, palpitations, lower extremity edema  GI: No abdominal pain, No N/V/D or constipation, No heartburn or reflux  GU: No dysuria, frequency or urgency, or incontinence  MUSCULOSKELETAL: No unrelieved bone/joint pain NEUROLOGIC: No headache, dizziness  PSYCHIATRIC: No overt anxiety or sadness  Vitals:   01/16/17 1232  BP: 109/73  Pulse: 66  Resp: 18  Temp: 97.5 F (36.4 C)   Body mass index is 25 kg/m. Physical Exam  GENERAL APPEARANCE: Alert, conversant, No acute distress  SKIN: No diaphoresis rash HEENT: bruising to periorbital area and lids is improved, conjunctiva is red, c/w blood, PERL RESPIRATORY: Breathing is even, unlabored. Lung sounds are clear   CARDIOVASCULAR: Heart RRR no murmurs, rubs or gallops. No peripheral edema  GASTROINTESTINAL: Abdomen is soft, non-tender, not distended w/ normal bowel sounds.  GENITOURINARY: Bladder non tender, not distended  MUSCULOSKELETAL: No abnormal joints or musculature NEUROLOGIC: Cranial nerves 2-12 grossly intact. Moves all extremities PSYCHIATRIC: Mood and affect appropriate to situation with dementia, no behavioral issues  Patient Active Problem List   Diagnosis Date Noted  . Hypothyroidism 10/03/2016  . Paranoia ([place]) 10/03/2016  . LOC (loss of consciousness) ([place]) 09/02/2016  .  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 Dementia with behavioral disturbance 09/02/2016  . Asthma with acute exacerbation 08/21/2016  . Atrial fibrillation with RVR ([place]) 08/21/2016  . Elevated INR 08/21/2016  . Acute on chronic systolic CHF (congestive heart failure) ([place]) 08/21/2016  . Hyperlipidemia 08/21/2016  . Electrolyte abnormality 08/21/2016  . HCAP (healthcare-associated pneumonia) 08/10/2016  . Sepsis ([place]) 08/09/2016  . Coronary atherosclerosis of native coronary artery 01/21/2014  . Obesity 01/17/2014  . Abdominal pain, generalized 10/18/2012  . GERD (gastroesophageal reflux disease)   . Depression   . Anxiety   . 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[place])   . Stroke ([place])   . Chest pain 01/07/2012  . Asthma 01/07/2012  . A-fib ([place]) 01/07/2012  . 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 Anemia 01/07/2012  . ANEMIA 07/29/2010  . BARRETTS ESOPHAGUS 06/04/2010    CMP     Component Value Date/Time   NA 140 09/29/2016   K 4.1 09/29/2016   CL 101 08/12/2016 0527   CO2 26 08/12/2016 0527   GLUCOSE 123 (H) 08/12/2016 0527   BUN 16 09/29/2016   CREATININE 1.1 09/29/2016   CREATININE 0.88 08/12/2016 0527   CALCIUM 10.0 08/12/2016 0527   PROT 5.9 (L) 08/12/2016 0527   ALBUMIN 2.7 (L) 08/12/2016 0527   AST 21 09/29/2016   ALT 19 09/29/2016   ALKPHOS 61 09/29/2016   BILITOT 1.0 08/12/2016 0527   GFRNONAA >60 08/12/2016 0527   GFRAA >60 08/12/2016 0527    Recent Labs  08/09/16 2029  08/10/16 0613  08/12/16 0527  08/27/16 09/02/16 09/29/16  NA 132*  < > 132*  < > 134*  < > 143 138 140  K 3.3*  < > 4.0  --  4.7  < > 3.9 4.2 4.1  CL 98*  --  100*  --  101  --   --   --   --   CO2 24  --  21*  --  26  --   --   --   --   GLUCOSE 96  --  150*  --  123*  --   --   --   --   BUN 23*  < > 23*  < >  29*  < > 13 18 16   CREATININE 0.96  < > 0.83  < > 0.88  < > 0.8 1.0 1.1  CALCIUM 10.1  --  9.9  --  10.0  --   --   --   --    < > = values in this interval not displayed.  Recent Labs  08/09/16 2029 08/12/16 0527 08/25/16 09/29/16  AST 100* 110* 21 21  ALT 80* 127* 25 19  ALKPHOS 93 88 61 61  BILITOT 1.2 1.0  --   --   PROT 6.6 5.9*  --   --   ALBUMIN 3.1* 2.7*  --   --     Recent Labs  08/09/16 2029  08/10/16 0613  08/12/16 0527 08/15/16 1053  08/27/16 09/02/16 09/29/16  WBC 15.8*  < > 17.5*  < > 23.0* 16.9*  < > 5.6 5.8 7.1  NEUTROABS 14.1*  --   --   --  20.5*  --   --   --   --   --   HGB 10.6*  < > 10.9*  < > 9.5* 9.9*  < > 10.2* 11.5* 13.4  HCT 32.3*  < > 33.0*  < > 29.1* 30.7*  < > 32* 35* 42  MCV 81.2  --  81.5  --  82.0 83.0  --   --   --   --   PLT 270  < > 262  < > 337 397  < > 190 199 189  < > = values in this interval not displayed.  Recent Labs  09/29/16  CHOL 172  LDLCALC 73  TRIG 128   No results found for: [facility] Lab Results  Component Value Date   TSH 9.47 (A) 09/29/2016   Lab Results  Component Value Date   HGBA1C 4.8 09/29/2016   Lab Results  Component Value Date   CHOL 172 09/29/2016   HDL 73 (A) 09/29/2016   LDLCALC 73 09/29/2016   TRIG 128 09/29/2016    Significant Diagnostic Results in last 30 days:  No results found.  Assessment and Plan  CONTUSION EYE/SUNCONJUNCTIVAL HEMORRHAGE - reassured pt and nursing, this is blood in the lids following gravity and draining into conjunctiva and it wlll drain out ; there is nothing to do, time will resolve this problem    Inocencio Homes, MD

## 2017-01-21 ENCOUNTER — Non-Acute Institutional Stay (SKILLED_NURSING_FACILITY): Payer: Medicare Other | Admitting: Internal Medicine

## 2017-01-21 ENCOUNTER — Encounter: Payer: Self-pay | Admitting: Internal Medicine

## 2017-01-21 DIAGNOSIS — E034 Atrophy of thyroid (acquired): Secondary | ICD-10-CM

## 2017-01-21 DIAGNOSIS — F331 Major depressive disorder, recurrent, moderate: Secondary | ICD-10-CM | POA: Diagnosis not present

## 2017-01-21 DIAGNOSIS — D508 Other iron deficiency anemias: Secondary | ICD-10-CM | POA: Diagnosis not present

## 2017-01-21 NOTE — Progress Notes (Signed)
Location:  [facility] Room Number: [phone] Place of Service:  SNF [phone])  Cindy [name]. [name], MD  Patient Care Team: Cindy Poll, MD as PCP - General [facility])  Extended Emergency Contact Information Primary Emergency Contact: [name],Cindy Address: [address] [place], [place] [address] Cindy [name] of [place] Phone: [phone] Work Phone: [phone] Relation: Daughter Secondary Emergency Contact: Cindy [name] of [place] Phone: [phone] Relation: Niece    Allergies: Patient has no known allergies.  Chief Complaint  Patient presents with  . Medical Management of Chronic Issues    Routine Visit    HPI: Patient is 76 y.o. female who is being seen for routine issues of hypothyroidism, depression nd anemia.  Past Medical History:  Diagnosis Date  . Angina pectoris ([place])   . Anxiety   . Asthma   . Barrett's esophagus   . Bronchitis   . 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 Chest pain   . Coronary artery disease    right carotid occlusion   . Depression   . Dysrhythmia    atrial fibrillation  . GERD (gastroesophageal reflux disease)   . Hyperlipidemia   . Hypertension   . Recurrent upper respiratory infection (URI)   . Stroke [facility])    2007, after left endarterectomy    Past Surgical History:  Procedure Laterality Date  . ABDOMINAL HYSTERECTOMY    . BALLOON DILATION  10/18/2012   Procedure: BALLOON DILATION;  Surgeon: Lear Ng, MD;  [facility];  Service: Endoscopy;  Laterality: N/A;  . CARDIAC CATHETERIZATION    . CAROTID ENDARTERECTOMY     left, complicated by stroke  . CHOLECYSTECTOMY     incidental at time of colectomy  . COLONOSCOPY  12/01/2011   Procedure: COLONOSCOPY;  Surgeon: Lear Ng, MD;  [facility];  Service: Endoscopy;  Laterality: N/A;  . COLONOSCOPY  10/18/2012   Procedure: COLONOSCOPY;  Surgeon: Lear Ng, MD;  [facility];  Service: Endoscopy;  Laterality: N/A;  colonic dilation  . ESOPHAGOGASTRODUODENOSCOPY  12/01/2011   Procedure: ESOPHAGOGASTRODUODENOSCOPY (EGD);  Surgeon: Lear Ng, MD;  [facility];  Service: Endoscopy;  Laterality: N/A;  . HOT HEMOSTASIS  12/01/2011   Procedure: HOT HEMOSTASIS (ARGON PLASMA COAGULATION/BICAP);  Surgeon: Lear Ng, MD;  Location[facility];  Service: Endoscopy;  Laterality: N/A;  . 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 Always use your most recent med list.          acetaminophen 325 MG tablet Commonly known as:  TYLENOL Take 2 tablets (650 mg total) by mouth every 6 (six) hours as needed for mild pain (or Fever >/= 101).   albuterol 108 (90 Base) MCG/ACT inhaler Commonly known as:  PROVENTIL HFA;VENTOLIN HFA Inhale 2 puffs into the lungs every 6 (six) hours as needed for shortness of breath.   alendronate 70 MG tablet Commonly known as:  FOSAMAX Take 70 mg by mouth every Monday. Take with a full glass of water on an empty stomach.   aspirin EC 81 MG tablet Take 81 mg by mouth daily.   budesonide-formoterol 160-4.5 MCG/ACT inhaler Commonly known as:  SYMBICORT Inhale 2  puffs into the lungs 2 (two) times daily.   carvedilol 3.125 MG tablet Commonly known as:  COREG Take 1 tablet (3.125 mg total) by mouth 2 (two) times daily with a meal.   ENSURE Take 237 mLs by mouth daily.   ferrous sulfate 325 (65 FE) MG tablet Take 325 mg by mouth daily with breakfast.   fluticasone 50 MCG/ACT nasal spray Commonly known as:  FLONASE Place 1 spray into both nostrils daily.   guaifenesin 100 MG/5ML syrup Commonly known as:  ROBITUSSIN Take 200 mg by mouth every 6 (six) hours as needed for cough.   ipratropium-albuterol 0.5-2.5 (3) MG/3ML Soln Commonly known as:   DUONEB Take 3 mLs by nebulization every 4 (four) hours as needed.   isosorbide mononitrate 30 MG 24 hr tablet Commonly known as:  IMDUR Take 30 mg by mouth daily.   levothyroxine 25 MCG tablet Commonly known as:  SYNTHROID, LEVOTHROID Take 25 mcg by mouth daily before breakfast.   loratadine 10 MG tablet Commonly known as:  CLARITIN Take 10 mg by mouth daily.   Melatonin 3 MG Tabs Take 1 tablet by mouth at bedtime.   mirtazapine 15 MG tablet Commonly known as:  REMERON Take 7.5 mg by mouth at bedtime. 1/2 TAB   nitroGLYCERIN 0.4 MG SL tablet Commonly known as:  NITROSTAT Place 0.4 mg under the tongue every 5 (five) minutes as needed for chest pain.   omeprazole 40 MG capsule Commonly known as:  PRILOSEC Take 40 mg by mouth daily.   polyethylene glycol packet Commonly known as:  MIRALAX / GLYCOLAX Take 17 g by mouth daily as needed.   QUEtiapine 50 MG tablet Commonly known as:  SEROQUEL Take 50 mg by mouth at bedtime.   Rivaroxaban 15 MG Tabs tablet Commonly known as:  XARELTO Take 15 mg by mouth daily.   sertraline 50 MG tablet Commonly known as:  ZOLOFT Take 50 mg by mouth at bedtime.   simvastatin 10 MG tablet Commonly known as:  ZOCOR Take 10 mg by mouth at bedtime.   sodium chloride 0.65 % nasal spray Commonly known as:  OCEAN Place 1 spray into the nose every 4 (four) hours as needed for congestion. Into both nares   tiotropium 18 MCG inhalation capsule Commonly known as:  SPIRIVA Place 18 mcg into inhaler and inhale daily.   traZODone 150 MG tablet Commonly known as:  DESYREL Take 50 mg by mouth at bedtime.   UNABLE TO FIND Ativan Gel 0.5 mg/ml Apply 1 ml topically to skin daily and can also apply three times daily as needed for anxiety   Vitamin D3 1000 units Caps Take 1 capsule by mouth daily.       No orders of the defined types were placed in this encounter.   Immunization History  Administered Date(s) Administered  .  Influenza-Unspecified 08/26/2016  . PPD Test 08/16/2016  . Pneumococcal Polysaccharide-23 01/08/2012    Social History  Substance Use Topics  . Smoking status: Former Smoker    Quit date: 04/09/2003  . Smokeless tobacco: Never Used  . 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 Physical Exam  GENERAL APPEARANCE: Alert, babbles with no meaning, No acute distress  SKIN: No diaphoresis rash HEENT: Unremarkable RESPIRATORY: Breathing is even, unlabored. Lung sounds are clear   CARDIOVASCULAR: Heart RRR no murmurs, rubs or gallops. No peripheral edema  GASTROINTESTINAL: Abdomen is soft, non-tender, not distended w/ normal bowel sounds.  GENITOURINARY: Bladder non tender, not distended  MUSCULOSKELETAL: No abnormal joints or musculature NEUROLOGIC: Cranial nerves 2-12 grossly intact; BLE weakness but able to get around in [facility] very well PSYCHIATRIC: Mood and affect very odd, no behavioral issues  Patient Active Problem List   Diagnosis Date Noted  . Hypothyroidism 10/03/2016  . Paranoia ([place]) 10/03/2016  . LOC (loss of consciousness) ([place]) 09/02/2016  . 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 Dementia with behavioral disturbance 09/02/2016  . Asthma with acute exacerbation 08/21/2016  . Atrial fibrillation with RVR ([place]) 08/21/2016  . Elevated INR 08/21/2016  . Acute on chronic systolic CHF (congestive heart failure) ([place]) 08/21/2016  . Hyperlipidemia 08/21/2016  . Electrolyte abnormality 08/21/2016  . HCAP (healthcare-associated pneumonia) 08/10/2016  . Sepsis ([place]) 08/09/2016  . Coronary atherosclerosis of native coronary artery 01/21/2014  . Obesity 01/17/2014  . Abdominal pain, generalized 10/18/2012  . GERD (gastroesophageal reflux disease)   . Depression   .  Anxiety   . 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[place])   . Stroke ([place])   . Chest pain 01/07/2012  . Asthma 01/07/2012  . A-fib ([place]) 01/07/2012  . 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 Anemia 01/07/2012  . ANEMIA 07/29/2010  . BARRETTS ESOPHAGUS 06/04/2010    CMP     Component Value Date/Time   NA 140 09/29/2016   K 4.1 09/29/2016   CL 101 08/12/2016 0527   CO2 26 08/12/2016 0527   GLUCOSE 123 (H) 08/12/2016 0527   BUN 16 09/29/2016   CREATININE 1.1 09/29/2016   CREATININE 0.88 08/12/2016 0527   CALCIUM 10.0 08/12/2016 0527   PROT 5.9 (L) 08/12/2016 0527   ALBUMIN 2.7 (L) 08/12/2016 0527   AST 21 09/29/2016   ALT 19 09/29/2016   ALKPHOS 61 09/29/2016   BILITOT 1.0 08/12/2016 0527   GFRNONAA >60 08/12/2016 0527   GFRAA >60 08/12/2016 0527    Recent Labs  08/09/16 2029  08/10/16 0613  08/12/16 0527  08/27/16 09/02/16 09/29/16  NA 132*  < > 132*  < > 134*  < > 143 138 140  K 3.3*  < > 4.0  --  4.7  < > 3.9 4.2 4.1  CL 98*  --  100*  --  101  --   --   --   --   CO2 24  --  21*  --  26  --   --   --   --   GLUCOSE 96  --  150*  --  123*  --   --   --   --   BUN 23*  < > 23*  < > 29*  < > 13 18 16   CREATININE 0.96  < > 0.83  < > 0.88  < > 0.8 1.0 1.1  CALCIUM 10.1  --  9.9  --  10.0  --   --   --   --   < > = values in this interval not displayed.  Recent Labs  08/09/16 2029 08/12/16 0527 08/25/16 09/29/16  AST 100* 110* 21 21  ALT 80* 127* 25 19  ALKPHOS 93 88 61 61  BILITOT 1.2 1.0  --   --   PROT 6.6  5.9*  --   --   ALBUMIN 3.1* 2.7*  --   --     Recent Labs  08/09/16 2029  08/10/16 0613  08/12/16 0527 08/15/16 1053  08/27/16 09/02/16 09/29/16  WBC 15.8*  < > 17.5*  < > 23.0* 16.9*  < > 5.6 5.8 7.1  NEUTROABS 14.1*  --   --   --  20.5*  --   --   --   --   --   HGB 10.6*  < > 10.9*  < > 9.5* 9.9*  < > 10.2* 11.5* 13.4  HCT 32.3*  < > 33.0*  < > 29.1* 30.7*  < > 32* 35* 42  MCV 81.2  --  81.5  --  82.0 83.0   --   --   --   --   PLT 270  < > 262  < > 337 397  < > 190 199 189  < > = values in this interval not displayed.  Recent Labs  09/29/16  CHOL 172  LDLCALC 73  TRIG 128   No results found for: [facility] Lab Results  Component Value Date   TSH 9.47 (A) 09/29/2016   Lab Results  Component Value Date   HGBA1C 4.8 09/29/2016   Lab Results  Component Value Date   CHOL 172 09/29/2016   HDL 73 (A) 09/29/2016   LDLCALC 73 09/29/2016   TRIG 128 09/29/2016    Significant Diagnostic Results in last 30 days:  No results found.  Assessment and Plan  Hypothyroidism Pt on synthroid 25 mcg daily for TSH 9; have ordered repeat TSH  Depression Stable ; cont zoloft 50 mg daily and trazodone 50 mg daily; will monitor  Anemia Chronic, stable; cont FeSO4 325 mg daily    Maitland Muhlbauer D. [name], MD

## 2017-02-05 ENCOUNTER — Encounter: Payer: Self-pay | Admitting: Internal Medicine

## 2017-02-05 NOTE — Assessment & Plan Note (Signed)
Stable ; cont zoloft 50 mg daily and trazodone 50 mg daily; will monitor

## 2017-02-05 NOTE — Assessment & Plan Note (Signed)
Pt on synthroid 25 mcg daily for TSH 9; have ordered repeat TSH

## 2017-02-05 NOTE — Assessment & Plan Note (Signed)
Chronic, stable; cont FeSO4 325 mg daily

## 2017-02-09 ENCOUNTER — Encounter: Payer: Self-pay | Admitting: Internal Medicine

## 2017-02-09 NOTE — Progress Notes (Signed)
Location:  [place] Room Number: [phone] Place of Service:  SNF [phone])  Cindy [name]. [name], MD  Patient Care Team: [name], MD as PCP - General [facility])  Extended Emergency Contact Information Primary Emergency Contact: Tyner,[name] Address: [address] [place], [place] [address] [name] of [place] Phone: [phone] Work Phone: [phone] Relation: Daughter Secondary Emergency Contact: [place] of [place] Phone: [phone] Relation: Niece    Allergies: Patient has no known allergies.  Chief Complaint  Patient presents with  . Acute Visit    HPI: Patient is 76 y.o. female who is being seen acutely because an outbreak of Influenza A per CDC guidelines was recognized on 12/29/2016. Pt has no c/o flu like symptoms;therefore pt will need to be prophylaxed with Tamiflu for a minimum of 14 days per CDC protocol.  Past Medical History:  Diagnosis Date  . Angina pectoris ([place])   . Anxiety   . Asthma   . Barrett's esophagus   . Bronchitis   . 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 Chest pain   . Coronary artery disease    right carotid occlusion   . Depression   . Dysrhythmia    atrial fibrillation  . GERD (gastroesophageal reflux disease)   . Hyperlipidemia   . Hypertension   . Recurrent upper respiratory infection (URI)   . Stroke [facility])    2007, after left endarterectomy    Past Surgical History:  Procedure Laterality Date  . ABDOMINAL HYSTERECTOMY    . BALLOON DILATION  10/18/2012   Procedure: BALLOON DILATION;  Surgeon: Lear Ng, MD;  [facility]OSCOPY;  Service: Endoscopy;  Laterality: N/A;  . CARDIAC CATHETERIZATION    . CAROTID ENDARTERECTOMY     left, complicated by stroke  . CHOLECYSTECTOMY     incidental at time of colectomy  . COLONOSCOPY  12/01/2011   Procedure: COLONOSCOPY;  Surgeon: Lear Ng, MD;  [facility];  Service: Endoscopy;   Laterality: N/A;  . COLONOSCOPY  10/18/2012   Procedure: COLONOSCOPY;  Surgeon: Lear Ng, MD;  [facility];  Service: Endoscopy;  Laterality: N/A;  colonic dilation  . ESOPHAGOGASTRODUODENOSCOPY  12/01/2011   Procedure: ESOPHAGOGASTRODUODENOSCOPY (EGD);  Surgeon: Lear Ng, MD;  Location[facility];  Service: Endoscopy;  Laterality: N/A;  . HOT HEMOSTASIS  12/01/2011   Procedure: HOT HEMOSTASIS (ARGON PLASMA COAGULATION/BICAP);  Surgeon: Lear Ng, MD;  Location: [facility];  Service: Endoscopy;  Laterality: N/A;  . 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 Always use your most recent med list.          acetaminophen 325 MG tablet Commonly known as:  TYLENOL Take 2 tablets (650 mg total) by mouth every 6 (six) hours as needed for mild pain (or Fever >/= 101).   albuterol 108 (90 Base) MCG/ACT inhaler Commonly known as:  PROVENTIL HFA;VENTOLIN HFA Inhale 2 puffs into the lungs every 6 (six) hours as needed for shortness of breath.   alendronate 70 MG tablet Commonly known as:  FOSAMAX Take 70 mg by mouth every Monday. Take with a full glass of water on an empty stomach.   aspirin EC  81 MG tablet Take 81 mg by mouth daily.   budesonide-formoterol 160-4.5 MCG/ACT inhaler Commonly known as:  SYMBICORT Inhale 2 puffs into the lungs 2 (two) times daily.   carvedilol 3.125 MG tablet Commonly known as:  COREG Take 1 tablet (3.125 mg total) by mouth 2 (two) times daily with a meal.   ENSURE Take 237 mLs by mouth daily.   ferrous sulfate 325 (65 FE) MG tablet Take 325 mg by mouth daily with breakfast.   fluticasone 50 MCG/ACT nasal spray Commonly known as:  FLONASE Place 1 spray into both nostrils daily.   guaifenesin 100 MG/5ML syrup Commonly known as:  ROBITUSSIN Take 200 mg by  mouth every 6 (six) hours as needed for cough.   ipratropium-albuterol 0.5-2.5 (3) MG/3ML Soln Commonly known as:  DUONEB Take 3 mLs by nebulization every 4 (four) hours as needed.   isosorbide mononitrate 30 MG 24 hr tablet Commonly known as:  IMDUR Take 30 mg by mouth daily.   levothyroxine 25 MCG tablet Commonly known as:  SYNTHROID, LEVOTHROID Take 25 mcg by mouth daily before breakfast.   loratadine 10 MG tablet Commonly known as:  CLARITIN Take 10 mg by mouth daily.   Melatonin 3 MG Tabs Take 1 tablet by mouth at bedtime.   mirtazapine 15 MG tablet Commonly known as:  REMERON Take 7.5 mg by mouth at bedtime. 1/2 TAB   nitroGLYCERIN 0.4 MG SL tablet Commonly known as:  NITROSTAT Place 0.4 mg under the tongue every 5 (five) minutes as needed for chest pain.   omeprazole 40 MG capsule Commonly known as:  PRILOSEC Take 40 mg by mouth daily.   polyethylene glycol packet Commonly known as:  MIRALAX / GLYCOLAX Take 17 g by mouth daily as needed.   QUEtiapine 50 MG tablet Commonly known as:  SEROQUEL Take 50 mg by mouth at bedtime.   Rivaroxaban 15 MG Tabs tablet Commonly known as:  XARELTO Take 15 mg by mouth daily.   sertraline 50 MG tablet Commonly known as:  ZOLOFT Take 50 mg by mouth at bedtime.   simvastatin 10 MG tablet Commonly known as:  ZOCOR Take 10 mg by mouth at bedtime.   sodium chloride 0.65 % nasal spray Commonly known as:  OCEAN Place 1 spray into the nose every 4 (four) hours as needed for congestion. Into both nares   tiotropium 18 MCG inhalation capsule Commonly known as:  SPIRIVA Place 18 mcg into inhaler and inhale daily.   traZODone 150 MG tablet Commonly known as:  DESYREL Take 50 mg by mouth at bedtime.   Vitamin D3 1000 units Caps Take 1 capsule by mouth daily.       No orders of the defined types were placed in this encounter.   Immunization History  Administered Date(s) Administered  . Influenza-Unspecified  08/26/2016  . PPD Test 08/16/2016  . Pneumococcal Polysaccharide-23 01/08/2012    Social History  Substance Use Topics  . Smoking status: Former Smoker    Quit date: 04/09/2003  . Smokeless tobacco: Never Used  . Alcohol use No    Review of Systems  DATA OBTAINED: from patient, nurse GENERAL:  no fevers SKIN: No itching, rash HEENT: no rhinorrhea, congestion, ST or ear pain RESPIRATORY: No cough, wheezing, SOB CARDIAC: No chest pain, palpitations, lower extremity edema  GI: No abdominal pain, No N/V/D or constipation, No heartburn or reflux  MUSCULOSKELETAL: No muscle aches NEUROLOGIC: No headache, dizziness   Vitals:   12/30/16 1543  BP:  109/73  Pulse: 66  Resp: 18  Temp: 97.5 F (36.4 C)   Body mass index is 26.95 kg/m. Physical Exam  GENERAL APPEARANCE: Alert, conversant, No acute distress  SKIN: No diaphoresis rash HEENT: Unremarkable RESPIRATORY: Breathing is even, unlabored. Lung sounds are clear   CARDIOVASCULAR: Heart RRR no murmurs, rubs or gallops. No peripheral edema  GASTROINTESTINAL: Abdomen is soft, non-tender, not distended w/ normal bowel sounds.   NEUROLOGIC: Cranial nerves 2-12 grossly intact PSYCHIATRIC: baseline, no mental status changes  Patient Active Problem List   Diagnosis Date Noted  . Hypothyroidism 10/03/2016  . Paranoia ([place]) 10/03/2016  . LOC (loss of consciousness) ([place]) 09/02/2016  . 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 Dementia with behavioral disturbance 09/02/2016  . Asthma with acute exacerbation 08/21/2016  . Atrial fibrillation with RVR ([place]) 08/21/2016  . Elevated INR 08/21/2016  . Acute on chronic systolic CHF (congestive heart failure) ([place]) 08/21/2016  . Hyperlipidemia 08/21/2016  . Electrolyte abnormality 08/21/2016  . HCAP (healthcare-associated pneumonia) 08/10/2016  . Sepsis ([place]) 08/09/2016  . Coronary atherosclerosis of native coronary artery 01/21/2014  . Obesity 01/17/2014  . Abdominal pain,  generalized 10/18/2012  . GERD (gastroesophageal reflux disease)   . Depression   . Anxiety   . 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[place])   . Stroke ([place])   . Chest pain 01/07/2012  . Asthma 01/07/2012  . A-fib ([place]) 01/07/2012  . 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 Anemia 01/07/2012  . ANEMIA 07/29/2010  . BARRETTS ESOPHAGUS 06/04/2010    CMP     Component Value Date/Time   NA 140 09/29/2016   K 4.1 09/29/2016   CL 101 08/12/2016 0527   CO2 26 08/12/2016 0527   GLUCOSE 123 (H) 08/12/2016 0527   BUN 16 09/29/2016   CREATININE 1.1 09/29/2016   CREATININE 0.88 08/12/2016 0527   CALCIUM 10.0 08/12/2016 0527   PROT 5.9 (L) 08/12/2016 0527   ALBUMIN 2.7 (L) 08/12/2016 0527   AST 21 09/29/2016   ALT 19 09/29/2016   ALKPHOS 61 09/29/2016   BILITOT 1.0 08/12/2016 0527   GFRNONAA >60 08/12/2016 0527   GFRAA >60 08/12/2016 0527    Recent Labs  08/09/16 2029  08/10/16 0613  08/12/16 0527  08/27/16 09/02/16 09/29/16  NA 132*  < > 132*  < > 134*  < > 143 138 140  K 3.3*  < > 4.0  --  4.7  < > 3.9 4.2 4.1  CL 98*  --  100*  --  101  --   --   --   --   CO2 24  --  21*  --  26  --   --   --   --   GLUCOSE 96  --  150*  --  123*  --   --   --   --   BUN 23*  < > 23*  < > 29*  < > 13 18 16   CREATININE 0.96  < > 0.83  < > 0.88  < > 0.8 1.0 1.1  CALCIUM 10.1  --  9.9  --  10.0  --   --   --   --   < > = values in this interval not displayed.  Recent Labs  08/09/16 2029 08/12/16 0527 08/25/16 09/29/16  AST 100* 110* 21 21  ALT 80* 127* 25 19  ALKPHOS 93 88 61 61  BILITOT 1.2 1.0  --   --  PROT 6.6 5.9*  --   --   ALBUMIN 3.1* 2.7*  --   --     Recent Labs  08/09/16 2029  08/10/16 0613  08/12/16 0527 08/15/16 1053  08/27/16 09/02/16 09/29/16  WBC 15.8*  < > 17.5*  < > 23.0* 16.9*  < > 5.6 5.8 7.1  NEUTROABS 14.1*  --   --   --  20.5*  --   --   --   --   --   HGB 10.6*  < > 10.9*  < > 9.5* 9.9*  < > 10.2* 11.5* 13.4  HCT  32.3*  < > 33.0*  < > 29.1* 30.7*  < > 32* 35* 42  MCV 81.2  --  81.5  --  82.0 83.0  --   --   --   --   PLT 270  < > 262  < > 337 397  < > 190 199 189  < > = values in this interval not displayed.  Recent Labs  09/29/16  CHOL 172  LDLCALC 73  TRIG 128   No results found for: [facility] Lab Results  Component Value Date   TSH 9.47 (A) 09/29/2016   Lab Results  Component Value Date   HGBA1C 4.8 09/29/2016   Lab Results  Component Value Date   CHOL 172 09/29/2016   HDL 73 (A) 09/29/2016   LDLCALC 73 09/29/2016   TRIG 128 09/29/2016    Significant Diagnostic Results in last 30 days:  No results found.  Assessment and Plan  EXPOSURE TO FLU/ INFLUENZA OUTBREAK AT SNF-   CrCl calculated by me-   42     Dose for 14 days-  30 mg daily Pt will be monitored daily for flu like symptoms                                                                                 Webb Silversmith D. [name], MD

## 2017-02-19 ENCOUNTER — Encounter: Payer: Self-pay | Admitting: Internal Medicine

## 2017-02-21 ENCOUNTER — Encounter: Payer: Self-pay | Admitting: Internal Medicine

## 2017-02-21 ENCOUNTER — Non-Acute Institutional Stay (SKILLED_NURSING_FACILITY): Payer: Medicare Other | Admitting: Internal Medicine

## 2017-02-21 DIAGNOSIS — I4891 Unspecified atrial fibrillation: Secondary | ICD-10-CM | POA: Diagnosis not present

## 2017-02-21 DIAGNOSIS — I110 Hypertensive heart disease with heart failure: Secondary | ICD-10-CM

## 2017-02-21 DIAGNOSIS — I5022 Chronic systolic (congestive) heart failure: Secondary | ICD-10-CM | POA: Diagnosis not present

## 2017-02-21 DIAGNOSIS — I25119 Atherosclerotic heart disease of native coronary artery with unspecified angina pectoris: Secondary | ICD-10-CM | POA: Diagnosis not present

## 2017-02-21 DIAGNOSIS — I209 Angina pectoris, unspecified: Secondary | ICD-10-CM

## 2017-02-21 NOTE — Progress Notes (Signed)
Location:  [place] Room Number: [phone] Place of Service:  SNF [phone])  Cindy [name]. [name], MD  Patient Care Team: [name], MD as PCP - General [facility])  Extended Emergency Contact Information Primary Emergency Contact: Tyner,[name] Address: [address] [place], [place] [address] [name] of [place] Phone: [phone] Work Phone: [phone] Relation: Daughter Secondary Emergency Contact: [place] of [place] Phone: [phone] Relation: Niece    Allergies: Patient has no known allergies.  Chief Complaint  Patient presents with  . Medical Management of Chronic Issues    Routine Visit    HPI: Patient is 76 y.o. female who is being seen for routine issues of AF, CAD and HTN.  Past Medical History:  Diagnosis Date  . Angina pectoris ([place])   . Anxiety   . Asthma   . Barrett's esophagus   . Bronchitis   . 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 Chest pain   . Coronary artery disease    right carotid occlusion   . Depression   . Dysrhythmia    atrial fibrillation  . GERD (gastroesophageal reflux disease)   . Hyperlipidemia   . Hypertension   . Recurrent upper respiratory infection (URI)   . Stroke [facility])    2007, after left endarterectomy    Past Surgical History:  Procedure Laterality Date  . ABDOMINAL HYSTERECTOMY    . BALLOON DILATION  10/18/2012   Procedure: BALLOON DILATION;  Surgeon: Lear Ng, MD;  [facility];  Service: Endoscopy;  Laterality: N/A;  . CARDIAC CATHETERIZATION    . CAROTID ENDARTERECTOMY     left, complicated by stroke  . CHOLECYSTECTOMY     incidental at time of colectomy  . COLONOSCOPY  12/01/2011   Procedure: COLONOSCOPY;  Surgeon: Lear Ng, MD;  [facility];  Service: Endoscopy;  Laterality: N/A;  . COLONOSCOPY  10/18/2012   Procedure: COLONOSCOPY;  Surgeon: Lear Ng, MD;  [facility];   Service: Endoscopy;  Laterality: N/A;  colonic dilation  . ESOPHAGOGASTRODUODENOSCOPY  12/01/2011   Procedure: ESOPHAGOGASTRODUODENOSCOPY (EGD);  Surgeon: Lear Ng, MD;  Location: [facility];  Service: Endoscopy;  Laterality: N/A;  . HOT HEMOSTASIS  12/01/2011   Procedure: HOT HEMOSTASIS (ARGON PLASMA COAGULATION/BICAP);  Surgeon: Lear Ng, MD;  Location: [facility];  Service: Endoscopy;  Laterality: N/A;  . 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 Always use your most recent med list.          acetaminophen 325 MG tablet Commonly known as:  TYLENOL Take 2 tablets (650 mg total) by mouth every 6 (six) hours as needed for mild pain (or Fever >/= 101).   albuterol 108 (90 Base) MCG/ACT inhaler Commonly known as:  PROVENTIL HFA;VENTOLIN HFA Inhale 2 puffs into the lungs every 6 (six) hours as needed for shortness of breath.   alendronate 70 MG tablet Commonly known as:  FOSAMAX Take 70 mg by mouth every Monday. Take with a full glass of water on an empty stomach.   aspirin EC 81 MG tablet Take 81 mg by mouth daily.   budesonide-formoterol 160-4.5 MCG/ACT inhaler Commonly known as:  SYMBICORT Inhale 2  puffs into the lungs 2 (two) times daily.   carvedilol 3.125 MG tablet Commonly known as:  COREG Take 1 tablet (3.125 mg total) by mouth 2 (two) times daily with a meal.   ENSURE Take 237 mLs by mouth daily.   ferrous sulfate 325 (65 FE) MG tablet Take 325 mg by mouth daily with breakfast.   fluticasone 50 MCG/ACT nasal spray Commonly known as:  FLONASE Place 1 spray into both nostrils daily.   guaifenesin 100 MG/5ML syrup Commonly known as:  ROBITUSSIN Take 200 mg by mouth every 6 (six) hours as needed for cough.   ipratropium-albuterol 0.5-2.5 (3) MG/3ML Soln Commonly known as:  DUONEB Take 3  mLs by nebulization every 4 (four) hours as needed.   isosorbide mononitrate 30 MG 24 hr tablet Commonly known as:  IMDUR Take 30 mg by mouth daily.   levothyroxine 25 MCG tablet Commonly known as:  SYNTHROID, LEVOTHROID Take 25 mcg by mouth daily before breakfast.   loratadine 10 MG tablet Commonly known as:  CLARITIN Take 10 mg by mouth daily.   Melatonin 3 MG Tabs Take 1 tablet by mouth at bedtime.   mirtazapine 15 MG tablet Commonly known as:  REMERON Take 7.5 mg by mouth at bedtime. 1/2 TAB   nitroGLYCERIN 0.4 MG SL tablet Commonly known as:  NITROSTAT Place 0.4 mg under the tongue every 5 (five) minutes as needed for chest pain.   omeprazole 40 MG capsule Commonly known as:  PRILOSEC Take 40 mg by mouth daily.   polyethylene glycol packet Commonly known as:  MIRALAX / GLYCOLAX Take 17 g by mouth daily as needed.   QUEtiapine 50 MG tablet Commonly known as:  SEROQUEL Take 50 mg by mouth at bedtime.   Rivaroxaban 15 MG Tabs tablet Commonly known as:  XARELTO Take 15 mg by mouth daily.   sertraline 50 MG tablet Commonly known as:  ZOLOFT Take 50 mg by mouth at bedtime.   simvastatin 10 MG tablet Commonly known as:  ZOCOR Take 10 mg by mouth at bedtime.   sodium chloride 0.65 % nasal spray Commonly known as:  OCEAN Place 1 spray into the nose every 4 (four) hours as needed for congestion. Into both nares   tiotropium 18 MCG inhalation capsule Commonly known as:  SPIRIVA Place 18 mcg into inhaler and inhale daily.   traZODone 150 MG tablet Commonly known as:  DESYREL Take 50 mg by mouth at bedtime.   Vitamin D3 1000 units Caps Take 1 capsule by mouth daily.       No orders of the defined types were placed in this encounter.   Immunization History  Administered Date(s) Administered  . Influenza-Unspecified 08/26/2016  . PPD Test 08/16/2016  . Pneumococcal Polysaccharide-23 01/08/2012    Social History  Substance Use Topics  . Smoking  status: Former Smoker    Quit date: 04/09/2003  . Smokeless tobacco: Never Used  . Alcohol use No    Review of Systems  UTO 2/2 dementia; nursing without new concerns    Vitals:   02/21/17 1152  BP: 109/73  Pulse: 66  Resp: 18  Temp: 97.5 F (36.4 C)   Body mass index is 27.93 kg/m. Physical Exam  GENERAL APPEARANCE: Alert, talks toi herself, No acute distress  SKIN: No diaphoresis rash HEENT: Unremarkable RESPIRATORY: Breathing is even, unlabored. Lung sounds are clear   CARDIOVASCULAR: Heart RRR no murmurs, rubs or gallops. No peripheral edema  GASTROINTESTINAL: Abdomen is soft, non-tender, not distended  w/ normal bowel sounds.  GENITOURINARY: Bladder non tender, not distended  MUSCULOSKELETAL: No abnormal joints or musculature NEUROLOGIC: Cranial nerves 2-12 grossly intact. Moves all extremities PSYCHIATRIC: Mood and affect very odd, not very aware of others, no behavioral issues  Patient Active Problem List   Diagnosis Date Noted  . Hypothyroidism 10/03/2016  . Paranoia ([place]) 10/03/2016  . LOC (loss of consciousness) ([place]) 09/02/2016  . 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 Dementia with behavioral disturbance 09/02/2016  . Asthma with acute exacerbation 08/21/2016  . Atrial fibrillation with RVR ([place]) 08/21/2016  . Elevated INR 08/21/2016  . Acute on chronic systolic CHF (congestive heart failure) ([place]) 08/21/2016  . Hyperlipidemia 08/21/2016  . Electrolyte abnormality 08/21/2016  . HCAP (healthcare-associated pneumonia) 08/10/2016  . Sepsis ([place]) 08/09/2016  . Coronary atherosclerosis of native coronary artery 01/21/2014  . Obesity 01/17/2014  . Abdominal pain, generalized 10/18/2012  . GERD (gastroesophageal reflux disease)   . Depression   . Anxiety   . 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[place])   . Stroke ([place])   . Chest pain 01/07/2012  . Asthma 01/07/2012  . A-fib ([place]) 01/07/2012  . History of CVA with residual  deficit 01/07/2012  . Anemia 01/07/2012  . ANEMIA 07/29/2010  . BARRETTS ESOPHAGUS 06/04/2010    CMP     Component Value Date/Time   NA 140 09/29/2016   K 4.1 09/29/2016   CL 101 08/12/2016 0527   CO2 26 08/12/2016 0527   GLUCOSE 123 (H) 08/12/2016 0527   BUN 16 09/29/2016   CREATININE 1.1 09/29/2016   CREATININE 0.88 08/12/2016 0527   CALCIUM 10.0 08/12/2016 0527   PROT 5.9 (L) 08/12/2016 0527   ALBUMIN 2.7 (L) 08/12/2016 0527   AST 21 09/29/2016   ALT 19 09/29/2016   ALKPHOS 61 09/29/2016   BILITOT 1.0 08/12/2016 0527   GFRNONAA >60 08/12/2016 0527   GFRAA >60 08/12/2016 0527    Recent Labs  08/09/16 2029  08/10/16 0613  08/12/16 0527  08/27/16 09/02/16 09/29/16  NA 132*  < > 132*  < > 134*  < > 143 138 140  K 3.3*  < > 4.0  --  4.7  < > 3.9 4.2 4.1  CL 98*  --  100*  --  101  --   --   --   --   CO2 24  --  21*  --  26  --   --   --   --   GLUCOSE 96  --  150*  --  123*  --   --   --   --   BUN 23*  < > 23*  < > 29*  < > 13 18 16   CREATININE 0.96  < > 0.83  < > 0.88  < > 0.8 1.0 1.1  CALCIUM 10.1  --  9.9  --  10.0  --   --   --   --   < > = values in this interval not displayed.  Recent Labs  08/09/16 2029 08/12/16 0527 08/25/16 09/29/16  AST 100* 110* 21 21  ALT 80* 127* 25 19  ALKPHOS 93 88 61 61  BILITOT 1.2 1.0  --   --   PROT 6.6 5.9*  --   --   ALBUMIN 3.1* 2.7*  --   --     Recent Labs  08/09/16 2029  08/10/16 0613  08/12/16 0527 08/15/16 1053  08/27/16 09/02/16 09/29/16  WBC  15.8*  < > 17.5*  < > 23.0* 16.9*  < > 5.6 5.8 7.1  NEUTROABS 14.1*  --   --   --  20.5*  --   --   --   --   --   HGB 10.6*  < > 10.9*  < > 9.5* 9.9*  < > 10.2* 11.5* 13.4  HCT 32.3*  < > 33.0*  < > 29.1* 30.7*  < > 32* 35* 42  MCV 81.2  --  81.5  --  82.0 83.0  --   --   --   --   PLT 270  < > 262  < > 337 397  < > 190 199 189  < > = values in this interval not displayed.  Recent Labs  09/29/16  CHOL 172  LDLCALC 73  TRIG 128   No results found for:  [facility] Lab Results  Component Value Date   TSH 9.47 (A) 09/29/2016   Lab Results  Component Value Date   HGBA1C 4.8 09/29/2016   Lab Results  Component Value Date   CHOL 172 09/29/2016   HDL 73 (A) 09/29/2016   LDLCALC 73 09/29/2016   TRIG 128 09/29/2016    Significant Diagnostic Results in last 30 days:  No results found.  Assessment and Plan  A-fib ([place]) Stable; plan to cont coreg 3.125 mg BID; pt now on xarelto 15 mg daily as prophylaxis.  Coronary atherosclerosis of native coronary artery No use of NTG SL noted or CP reported; plan to cont current regimen- Imdur 30 mg daily, coreg 3.215 mg BID, ASA 81 mg daily and statin, zocor 10 mg daily  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HCC) Controlled; cont coreg 3.125 mg daily     Ardyce Heyer D. [name], MD

## 2017-03-20 ENCOUNTER — Encounter: Payer: Self-pay | Admitting: Internal Medicine

## 2017-03-20 NOTE — Assessment & Plan Note (Signed)
No use of NTG SL noted or CP reported; plan to cont current regimen- Imdur 30 mg daily, coreg 3.215 mg BID, ASA 81 mg daily and statin, zocor 10 mg daily

## 2017-03-20 NOTE — Assessment & Plan Note (Signed)
Stable; plan to cont coreg 3.125 mg BID; pt now on xarelto 15 mg daily as prophylaxis.

## 2017-03-20 NOTE — Assessment & Plan Note (Signed)
Controlled; cont coreg 3.125 mg daily

## 2017-03-23 ENCOUNTER — Non-Acute Institutional Stay (SKILLED_NURSING_FACILITY): Payer: Medicare Other | Admitting: Internal Medicine

## 2017-03-23 ENCOUNTER — Encounter: Payer: Self-pay | Admitting: Internal Medicine

## 2017-03-23 DIAGNOSIS — G301 Alzheimer's disease with late onset: Secondary | ICD-10-CM | POA: Diagnosis not present

## 2017-03-23 DIAGNOSIS — F0281 Dementia in other diseases classified elsewhere with behavioral disturbance: Secondary | ICD-10-CM | POA: Diagnosis not present

## 2017-03-23 DIAGNOSIS — F32A Depression, unspecified: Secondary | ICD-10-CM

## 2017-03-23 DIAGNOSIS — J45909 Unspecified asthma, uncomplicated: Secondary | ICD-10-CM

## 2017-03-23 DIAGNOSIS — F329 Major depressive disorder, single episode, unspecified: Secondary | ICD-10-CM

## 2017-03-23 NOTE — Progress Notes (Signed)
Location:  [facility] Room Number: [phone] Place of Service:  SNF [phone])  Cindy [name]. Cindy Coil, MD  Patient Care Team: [name], MD as PCP - General [facility])  Extended Emergency Contact Information Primary Emergency Contact: Tyner,[name] Address: [address] [place], [place] [address] [name] of [place] Phone: [phone] Work Phone: [phone] Relation: Daughter Secondary Emergency Contact: [employer] [place] Phone: [phone] Relation: Niece    Allergies: Patient has no known allergies.  Chief Complaint  Patient presents with  . Medical Management of Chronic Issues    Routine Visit    HPI: Patient is 76 y.o. female who is being seen for routine issues of asthma, dementia and depression.  Past Medical History:  Diagnosis Date  . Angina pectoris ([place])   . Anxiety   . Asthma   . Barrett's esophagus   . Bronchitis   . 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 Chest pain   . Coronary artery disease    right carotid occlusion   . Depression   . Dysrhythmia    atrial fibrillation  . GERD (gastroesophageal reflux disease)   . Hyperlipidemia   . Hypertension   . Recurrent upper respiratory infection (URI)   . Stroke [facility])    2007, after left endarterectomy    Past Surgical History:  Procedure Laterality Date  . ABDOMINAL HYSTERECTOMY    . BALLOON DILATION  10/18/2012   Procedure: BALLOON DILATION;  Surgeon: Lear Ng, MD;  Location: [facility];  Service: Endoscopy;  Laterality: N/A;  . CARDIAC CATHETERIZATION    . CAROTID ENDARTERECTOMY     left, complicated by stroke  . CHOLECYSTECTOMY     incidental at time of colectomy  . COLONOSCOPY  12/01/2011   Procedure: COLONOSCOPY;  Surgeon: Lear Ng, MD;  [facility];  Service: Endoscopy;  Laterality: N/A;  . COLONOSCOPY  10/18/2012   Procedure: COLONOSCOPY;  Surgeon: Lear Ng, MD;  [facility];  Service: Endoscopy;  Laterality: N/A;  colonic dilation  . ESOPHAGOGASTRODUODENOSCOPY  12/01/2011   Procedure: ESOPHAGOGASTRODUODENOSCOPY (EGD);  Surgeon: Lear Ng, MD;  Location: [facility];  Service: Endoscopy;  Laterality: N/A;  . HOT HEMOSTASIS  12/01/2011   Procedure: HOT HEMOSTASIS (ARGON PLASMA COAGULATION/BICAP);  Surgeon: Lear Ng, MD;  Location: [facility];  Service: Endoscopy;  Laterality: N/A;  . 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 Always use your most recent med list.          acetaminophen 325 MG tablet Commonly known as:  TYLENOL Take 2 tablets (650 mg total) by mouth every 6 (six) hours as needed for mild pain (or Fever >/= 101).   albuterol 108 (90 Base) MCG/ACT inhaler Commonly known as:  PROVENTIL HFA;VENTOLIN HFA Inhale 2 puffs into the lungs every 6 (six) hours as needed for shortness of breath.   alendronate 70 MG tablet Commonly known as:  FOSAMAX Take 70 mg by mouth every Monday. Take with a full glass of water on an empty stomach.   aspirin EC 81 MG tablet Take 81 mg by mouth daily.   budesonide-formoterol 160-4.5 MCG/ACT inhaler Commonly known as:  SYMBICORT Inhale 2  puffs into the lungs 2 (two) times daily.   carvedilol 3.125 MG tablet Commonly known as:  COREG Take 1 tablet (3.125 mg total) by mouth 2 (two) times daily with a meal.   ENSURE Take 237 mLs by mouth daily.   ferrous sulfate 325 (65 FE) MG tablet Take 325 mg by mouth daily with breakfast.   fluticasone 50 MCG/ACT nasal spray Commonly known as:  FLONASE Place 1 spray into both nostrils daily.   guaifenesin 100 MG/5ML syrup Commonly known as:  ROBITUSSIN Take 200 mg by mouth every 6 (six) hours as needed for cough.   ipratropium-albuterol 0.5-2.5 (3) MG/3ML Soln Commonly known as:   DUONEB Take 3 mLs by nebulization every 4 (four) hours as needed.   isosorbide mononitrate 30 MG 24 hr tablet Commonly known as:  IMDUR Take 30 mg by mouth daily.   levothyroxine 25 MCG tablet Commonly known as:  SYNTHROID, LEVOTHROID Take 25 mcg by mouth daily before breakfast.   loratadine 10 MG tablet Commonly known as:  CLARITIN Take 10 mg by mouth daily.   Melatonin 3 MG Tabs Take 1 tablet by mouth at bedtime.   mirtazapine 15 MG tablet Commonly known as:  REMERON Take 7.5 mg by mouth at bedtime. 1/2 TAB   nitroGLYCERIN 0.4 MG SL tablet Commonly known as:  NITROSTAT Place 0.4 mg under the tongue every 5 (five) minutes as needed for chest pain.   omeprazole 40 MG capsule Commonly known as:  PRILOSEC Take 40 mg by mouth daily.   polyethylene glycol packet Commonly known as:  MIRALAX / GLYCOLAX Take 17 g by mouth daily as needed.   QUEtiapine 50 MG tablet Commonly known as:  SEROQUEL Take 50 mg by mouth at bedtime.   Rivaroxaban 15 MG Tabs tablet Commonly known as:  XARELTO Take 15 mg by mouth daily.   sertraline 50 MG tablet Commonly known as:  ZOLOFT Take 50 mg by mouth at bedtime.   simvastatin 10 MG tablet Commonly known as:  ZOCOR Take 10 mg by mouth at bedtime.   sodium chloride 0.65 % nasal spray Commonly known as:  OCEAN Place 1 spray into the nose every 4 (four) hours as needed for congestion. Into both nares   tiotropium 18 MCG inhalation capsule Commonly known as:  SPIRIVA Place 18 mcg into inhaler and inhale daily.   traZODone 150 MG tablet Commonly known as:  DESYREL Take 50 mg by mouth at bedtime.   Vitamin D3 1000 units Caps Take 1 capsule by mouth daily.       No orders of the defined types were placed in this encounter.   Immunization History  Administered Date(s) Administered  . Influenza-Unspecified 08/26/2016  . PPD Test 08/16/2016  . Pneumococcal Polysaccharide-23 01/08/2012    Social History  Substance Use Topics    . Smoking status: Former Smoker    Quit date: 04/09/2003  . Smokeless tobacco: Never Used  . Alcohol use No    Review of Systems  UTO 2/2 dementia; nursing- no new concerns    Vitals:   03/23/17 0948  BP: (!) 141/64  Pulse: 60  Resp: 18  Temp: 98.2 F (36.8 C)   Body mass index is 29.92 kg/m. Physical Exam  GENERAL APPEARANCE: Alert, makes noises to herself, No acute distress  SKIN: No diaphoresis rash HEENT: Unremarkable RESPIRATORY: Breathing is even, unlabored. Lung sounds are clear   CARDIOVASCULAR: Heart RRR no murmurs, rubs or gallops. No peripheral edema  GASTROINTESTINAL: Abdomen is soft,  non-tender, not distended w/ normal bowel sounds.  GENITOURINARY: Bladder non tender, not distended  MUSCULOSKELETAL: No abnormal joints or musculature NEUROLOGIC: Cranial nerves 2-12 grossly intact. Moves all extremities PSYCHIATRIC: bizarre mood, unaware of others,  Patient Active Problem List   Diagnosis Date Noted  . Depression 04/24/2017  . Hypothyroidism 10/03/2016  . Paranoia ([place]) 10/03/2016  . LOC (loss of consciousness) ([place]) 09/02/2016  . 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 Dementia with behavioral disturbance 09/02/2016  . Asthma with acute exacerbation 08/21/2016  . Atrial fibrillation with RVR ([place]) 08/21/2016  . Elevated INR 08/21/2016  . Acute on chronic systolic CHF (congestive heart failure) ([place]) 08/21/2016  . Hyperlipidemia 08/21/2016  . Electrolyte abnormality 08/21/2016  . HCAP (healthcare-associated pneumonia) 08/10/2016  . Sepsis ([place]) 08/09/2016  . Coronary atherosclerosis of native coronary artery 01/21/2014  . Obesity 01/17/2014  . Abdominal pain, generalized 10/18/2012  . GERD (gastroesophageal reflux disease)   . Mood disorder ([place])   . Anxiety   . 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[place])   . Stroke ([place])   . Chest pain 01/07/2012  . Asthma 01/07/2012  . A-fib ([place]) 01/07/2012  . History of CVA  with residual deficit 01/07/2012  . Anemia 01/07/2012  . ANEMIA 07/29/2010  . BARRETTS ESOPHAGUS 06/04/2010    CMP     Component Value Date/Time   NA 138 04/08/2017   K 5.1 04/08/2017   CL 101 08/12/2016 0527   CO2 26 08/12/2016 0527   GLUCOSE 123 (H) 08/12/2016 0527   BUN 41 (A) 04/08/2017   CREATININE 1.1 04/08/2017   CREATININE 0.88 08/12/2016 0527   CALCIUM 10.0 08/12/2016 0527   PROT 5.9 (L) 08/12/2016 0527   ALBUMIN 2.7 (L) 08/12/2016 0527   AST 21 09/29/2016   ALT 19 09/29/2016   ALKPHOS 61 09/29/2016   BILITOT 1.0 08/12/2016 0527   GFRNONAA >60 08/12/2016 0527   GFRAA >60 08/12/2016 0527    Recent Labs  08/09/16 2029  08/10/16 0613  08/12/16 0527  09/02/16 09/29/16 04/08/17  NA 132*  < > 132*  < > 134*  < > 138 140 138  K 3.3*  < > 4.0  --  4.7  < > 4.2 4.1 5.1  CL 98*  --  100*  --  101  --   --   --   --   CO2 24  --  21*  --  26  --   --   --   --   GLUCOSE 96  --  150*  --  123*  --   --   --   --   BUN 23*  < > 23*  < > 29*  < > 18 16 41*  CREATININE 0.96  < > 0.83  < > 0.88  < > 1.0 1.1 1.1  CALCIUM 10.1  --  9.9  --  10.0  --   --   --   --   < > = values in this interval not displayed.  Recent Labs  08/09/16 2029 08/12/16 0527 08/25/16 09/29/16  AST 100* 110* 21 21  ALT 80* 127* 25 19  ALKPHOS 93 88 61 61  BILITOT 1.2 1.0  --   --   PROT 6.6 5.9*  --   --   ALBUMIN 3.1* 2.7*  --   --     Recent Labs  08/09/16 2029  08/10/16 7829  08/12/16 0527 08/15/16 1053  09/02/16 09/29/16 04/08/17  WBC 15.8*  < > 17.5*  < > 23.0* 16.9*  < > 5.8 7.1 12.2  NEUTROABS 14.1*  --   --   --  20.5*  --   --   --   --   --   HGB 10.6*  < > 10.9*  < > 9.5* 9.9*  < > 11.5* 13.4 13.9  HCT 32.3*  < > 33.0*  < > 29.1* 30.7*  < > 35* 42 43  MCV 81.2  --  81.5  --  82.0 83.0  --   --   --   --   PLT 270  < > 262  < > 337 397  < > 199 189 177  < > = values in this interval not displayed.  Recent Labs  09/29/16  CHOL 172  LDLCALC 73  TRIG 128   No results  found for: [facility] Lab Results  Component Value Date   TSH 4.09 04/08/2017   Lab Results  Component Value Date   HGBA1C 5.4 04/08/2017   Lab Results  Component Value Date   CHOL 172 09/29/2016   HDL 73 (A) 09/29/2016   LDLCALC 73 09/29/2016   TRIG 128 09/29/2016    Significant Diagnostic Results in last 30 days:  No results found.  Assessment and Plan  Asthma Chronic and stable; cont symbicort and spiriva scheduled, with prn alb and atrovent  Dementia with behavioral disturbance Chronic and stable with no major declines;on no dementia specific meds; cont supportive care  Depression Chronic and stable; cont remeron 15 mg qHS     Cindy Carreno D. Cindy Coil, MD

## 2017-04-08 LAB — TSH: TSH: 4.09 u[IU]/mL (ref 0.41–5.90)

## 2017-04-08 LAB — BASIC METABOLIC PANEL
BUN: 41 mg/dL — AB (ref 4–21)
Creatinine: 1.1 mg/dL (ref 0.5–1.1)
Glucose: 98 mg/dL
Potassium: 5.1 mmol/L (ref 3.4–5.3)
Sodium: 138 mmol/L (ref 137–147)

## 2017-04-08 LAB — CBC AND DIFFERENTIAL
HEMATOCRIT: 43 % (ref 36–46)
Hemoglobin: 13.9 g/dL (ref 12.0–16.0)
PLATELETS: 177 10*3/uL (ref 150–399)
WBC: 12.2 10*3/mL

## 2017-04-08 LAB — HEMOGLOBIN A1C: Hemoglobin A1C: 5.4

## 2017-04-11 ENCOUNTER — Non-Acute Institutional Stay (SKILLED_NURSING_FACILITY): Payer: Medicare Other | Admitting: Internal Medicine

## 2017-04-11 ENCOUNTER — Encounter: Payer: Self-pay | Admitting: Internal Medicine

## 2017-04-11 DIAGNOSIS — R197 Diarrhea, unspecified: Secondary | ICD-10-CM

## 2017-04-11 NOTE — Progress Notes (Signed)
Location:  [place] Room Number: [phone] Place of Service:  SNF [phone])  Cindy [name]. [name], MD  Patient Care Team: [name], MD as PCP - General [facility])  Extended Emergency Contact Information Primary Emergency Contact: Tyner,[name] Address: [address] [place], [place] [address] [name] of [place] Phone: [phone] Work Phone: [phone] Relation: Daughter Secondary Emergency Contact: [employer] [place] Phone: [phone] Relation: Niece    Allergies: Patient has no known allergies.  Chief Complaint  Patient presents with  . Acute Visit    Acute    HPI: Patient is 76 y.o. female who is being seen for continued diarrhea, several times a day. At first felt to be viral but lasting too long for that. C diff is negative. Pt doesn't appear sick and is not losing weight.  Past Medical History:  Diagnosis Date  . Angina pectoris ([place])   . Anxiety   . Asthma   . Barrett's esophagus   . Bronchitis   . 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 Chest pain   . Coronary artery disease    right carotid occlusion   . Depression   . Dysrhythmia    atrial fibrillation  . GERD (gastroesophageal reflux disease)   . Hyperlipidemia   . Hypertension   . Recurrent upper respiratory infection (URI)   . Stroke [facility])    2007, after left endarterectomy    Past Surgical History:  Procedure Laterality Date  . ABDOMINAL HYSTERECTOMY    . BALLOON DILATION  10/18/2012   Procedure: BALLOON DILATION;  Surgeon: Lear Ng, MD;  [facility];  Service: Endoscopy;  Laterality: N/A;  . CARDIAC CATHETERIZATION    . CAROTID ENDARTERECTOMY     left, complicated by stroke  . CHOLECYSTECTOMY     incidental at time of colectomy  . COLONOSCOPY  12/01/2011   Procedure: COLONOSCOPY;  Surgeon: Lear Ng, MD;  [facility];  Service: Endoscopy;  Laterality: N/A;  . COLONOSCOPY   10/18/2012   Procedure: COLONOSCOPY;  Surgeon: Lear Ng, MD;  [facility];  Service: Endoscopy;  Laterality: N/A;  colonic dilation  . ESOPHAGOGASTRODUODENOSCOPY  12/01/2011   Procedure: ESOPHAGOGASTRODUODENOSCOPY (EGD);  Surgeon: Lear Ng, MD;  Location: [facility];  Service: Endoscopy;  Laterality: N/A;  . HOT HEMOSTASIS  12/01/2011   Procedure: HOT HEMOSTASIS (ARGON PLASMA COAGULATION/BICAP);  Surgeon: Lear Ng, MD;  Location: [facility];  Service: Endoscopy;  Laterality: N/A;  . 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 Always use your most recent med list.          acetaminophen 325 MG tablet Commonly known as:  TYLENOL Take 2 tablets (650 mg total) by mouth every 6 (six) hours as needed for mild pain (or Fever >/= 101).   albuterol 108 (90 Base) MCG/ACT inhaler Commonly known as:  PROVENTIL HFA;VENTOLIN HFA Inhale 2 puffs into the lungs every 6 (six) hours as needed for shortness of breath.   alendronate 70 MG tablet Commonly known as:  FOSAMAX Take 70 mg by mouth every Monday. Take with a full glass of water on an empty stomach.   aspirin EC 81 MG  tablet Take 81 mg by mouth daily.   budesonide-formoterol 160-4.5 MCG/ACT inhaler Commonly known as:  SYMBICORT Inhale 2 puffs into the lungs 2 (two) times daily.   carvedilol 3.125 MG tablet Commonly known as:  COREG Take 1 tablet (3.125 mg total) by mouth 2 (two) times daily with a meal.   ENSURE Take 237 mLs by mouth daily.   ferrous sulfate 325 (65 FE) MG tablet Take 325 mg by mouth daily with breakfast.   fluticasone 50 MCG/ACT nasal spray Commonly known as:  FLONASE Place 1 spray into both nostrils daily.   guaifenesin 100 MG/5ML syrup Commonly known as:  ROBITUSSIN Take 200 mg by mouth every 6 (six) hours as  needed for cough.   ipratropium-albuterol 0.5-2.5 (3) MG/3ML Soln Commonly known as:  DUONEB Take 3 mLs by nebulization every 4 (four) hours as needed.   isosorbide mononitrate 30 MG 24 hr tablet Commonly known as:  IMDUR Take 30 mg by mouth daily.   levothyroxine 25 MCG tablet Commonly known as:  SYNTHROID, LEVOTHROID Take 25 mcg by mouth daily before breakfast.   loratadine 10 MG tablet Commonly known as:  CLARITIN Take 10 mg by mouth daily.   Melatonin 3 MG Tabs Take 1 tablet by mouth at bedtime.   mirtazapine 15 MG tablet Commonly known as:  REMERON Take 7.5 mg by mouth at bedtime. 1/2 TAB   nitroGLYCERIN 0.4 MG SL tablet Commonly known as:  NITROSTAT Place 0.4 mg under the tongue every 5 (five) minutes as needed for chest pain.   omeprazole 40 MG capsule Commonly known as:  PRILOSEC Take 40 mg by mouth daily.   polyethylene glycol packet Commonly known as:  MIRALAX / GLYCOLAX Take 17 g by mouth daily as needed.   QUEtiapine 50 MG tablet Commonly known as:  SEROQUEL Take 50 mg by mouth at bedtime.   Rivaroxaban 15 MG Tabs tablet Commonly known as:  XARELTO Take 15 mg by mouth daily.   sertraline 50 MG tablet Commonly known as:  ZOLOFT Take 50 mg by mouth at bedtime.   simvastatin 10 MG tablet Commonly known as:  ZOCOR Take 10 mg by mouth at bedtime.   sodium chloride 0.65 % nasal spray Commonly known as:  OCEAN Place 1 spray into the nose every 4 (four) hours as needed for congestion. Into both nares   tiotropium 18 MCG inhalation capsule Commonly known as:  SPIRIVA Place 18 mcg into inhaler and inhale daily.   traZODone 150 MG tablet Commonly known as:  DESYREL Take 50 mg by mouth at bedtime.   Vitamin D3 1000 units Caps Take 1 capsule by mouth daily.       No orders of the defined types were placed in this encounter.   Immunization History  Administered Date(s) Administered  . Influenza-Unspecified 08/26/2016  . PPD Test 08/16/2016    . Pneumococcal Polysaccharide-23 01/08/2012    Social History  Substance Use Topics  . Smoking status: Former Smoker    Quit date: 04/09/2003  . Smokeless tobacco: Never Used  . Alcohol use No    Review of Systems  DATA OBTAINED: from patient GENERAL:  no fevers, fatigue, appetite changes SKIN: No itching, rash HEENT: No complaint RESPIRATORY: No cough, wheezing, SOB CARDIAC: No chest pain, palpitations, lower extremity edema  GI: No abdominal pain, No N/V or constipation, + diarrhea, 2-4 times a day; No heartburn or reflux  GU: No dysuria, frequency or urgency, or incontinence  MUSCULOSKELETAL: No unrelieved bone/joint pain NEUROLOGIC:  No headache, dizziness  PSYCHIATRIC: No overt anxiety or sadness  Vitals:   04/11/17 1030  BP: (!) 150/78  Pulse: 64  Resp: 20  Temp: 98.9 F (37.2 C)   Body mass index is 29.92 kg/m. Physical Exam  GENERAL APPEARANCE: Alert, conversant, No acute distress  SKIN: No diaphoresis rash HEENT: Unremarkable RESPIRATORY: Breathing is even, unlabored. Lung sounds are clear   CARDIOVASCULAR: Heart RRR no murmurs, rubs or gallops. No peripheral edema  GASTROINTESTINAL: Abdomen is soft, non-tender, not distended w/ normal bowel sounds.  GENITOURINARY: Bladder non tender, not distended  MUSCULOSKELETAL: No abnormal joints or musculature NEUROLOGIC: Cranial nerves 2-12 grossly intact. Moves all extremities PSYCHIATRIC: Mood and affect appropriate to situation, no behavioral issues  Patient Active Problem List   Diagnosis Date Noted  . Hypothyroidism 10/03/2016  . Paranoia ([place]) 10/03/2016  . LOC (loss of consciousness) ([place]) 09/02/2016  . 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 Dementia with behavioral disturbance 09/02/2016  . Asthma with acute exacerbation 08/21/2016  . Atrial fibrillation with RVR ([place]) 08/21/2016  . Elevated INR 08/21/2016  . Acute on chronic systolic CHF (congestive heart failure) ([place]) 08/21/2016  .  Hyperlipidemia 08/21/2016  . Electrolyte abnormality 08/21/2016  . HCAP (healthcare-associated pneumonia) 08/10/2016  . Sepsis ([place]) 08/09/2016  . Coronary atherosclerosis of native coronary artery 01/21/2014  . Obesity 01/17/2014  . Abdominal pain, generalized 10/18/2012  . GERD (gastroesophageal reflux disease)   . Depression   . Anxiety   . 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[place])   . Stroke ([place])   . Chest pain 01/07/2012  . Asthma 01/07/2012  . A-fib ([place]) 01/07/2012  . 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 Anemia 01/07/2012  . ANEMIA 07/29/2010  . BARRETTS ESOPHAGUS 06/04/2010    CMP     Component Value Date/Time   NA 138 04/08/2017   K 5.1 04/08/2017   CL 101 08/12/2016 0527   CO2 26 08/12/2016 0527   GLUCOSE 123 (H) 08/12/2016 0527   BUN 41 (A) 04/08/2017   CREATININE 1.1 04/08/2017   CREATININE 0.88 08/12/2016 0527   CALCIUM 10.0 08/12/2016 0527   PROT 5.9 (L) 08/12/2016 0527   ALBUMIN 2.7 (L) 08/12/2016 0527   AST 21 09/29/2016   ALT 19 09/29/2016   ALKPHOS 61 09/29/2016   BILITOT 1.0 08/12/2016 0527   GFRNONAA >60 08/12/2016 0527   GFRAA >60 08/12/2016 0527    Recent Labs  08/09/16 2029  08/10/16 0613  08/12/16 0527  09/02/16 09/29/16 04/08/17  NA 132*  < > 132*  < > 134*  < > 138 140 138  K 3.3*  < > 4.0  --  4.7  < > 4.2 4.1 5.1  CL 98*  --  100*  --  101  --   --   --   --   CO2 24  --  21*  --  26  --   --   --   --   GLUCOSE 96  --  150*  --  123*  --   --   --   --   BUN 23*  < > 23*  < > 29*  < > 18 16 41*  CREATININE 0.96  < > 0.83  < > 0.88  < > 1.0 1.1 1.1  CALCIUM 10.1  --  9.9  --  10.0  --   --   --   --   < > = values in  this interval not displayed.  Recent Labs  08/09/16 2029 08/12/16 0527 08/25/16 09/29/16  AST 100* 110* 21 21  ALT 80* 127* 25 19  ALKPHOS 93 88 61 61  BILITOT 1.2 1.0  --   --   PROT 6.6 5.9*  --   --   ALBUMIN 3.1* 2.7*  --   --     Recent Labs  08/09/16 2029   08/10/16 0613  08/12/16 0527 08/15/16 1053  09/02/16 09/29/16 04/08/17  WBC 15.8*  < > 17.5*  < > 23.0* 16.9*  < > 5.8 7.1 12.2  NEUTROABS 14.1*  --   --   --  20.5*  --   --   --   --   --   HGB 10.6*  < > 10.9*  < > 9.5* 9.9*  < > 11.5* 13.4 13.9  HCT 32.3*  < > 33.0*  < > 29.1* 30.7*  < > 35* 42 43  MCV 81.2  --  81.5  --  82.0 83.0  --   --   --   --   PLT 270  < > 262  < > 337 397  < > 199 189 177  < > = values in this interval not displayed.  Recent Labs  09/29/16  CHOL 172  LDLCALC 73  TRIG 128   No results found for: [facility] Lab Results  Component Value Date   TSH 4.09 04/08/2017   Lab Results  Component Value Date   HGBA1C 5.4 04/08/2017   Lab Results  Component Value Date   CHOL 172 09/29/2016   HDL 73 (A) 09/29/2016   LDLCALC 73 09/29/2016   TRIG 128 09/29/2016    Significant Diagnostic Results in last 30 days:  No results found.  Assessment and Plan  DIARRHEA -non specific 2-4 times a day, watery, occ abd pain;c diff is neg, out of window for viral ; will tx with cipro 500 mg q 12 for 5 days     Cindy [name]. [name], MD

## 2017-04-20 ENCOUNTER — Encounter: Payer: Self-pay | Admitting: Internal Medicine

## 2017-04-20 ENCOUNTER — Non-Acute Institutional Stay (SKILLED_NURSING_FACILITY): Payer: Medicare Other | Admitting: Internal Medicine

## 2017-04-20 DIAGNOSIS — F22 Delusional disorders: Secondary | ICD-10-CM

## 2017-04-20 DIAGNOSIS — I6930 Unspecified sequelae of cerebral infarction: Secondary | ICD-10-CM | POA: Diagnosis not present

## 2017-04-20 DIAGNOSIS — K219 Gastro-esophageal reflux disease without esophagitis: Secondary | ICD-10-CM | POA: Diagnosis not present

## 2017-04-20 NOTE — Progress Notes (Signed)
Location:  [place] Room Number: [phone] Place of Service:  SNF [phone])  Cindy [name]. Cindy Coil, MD   Patient Care Team: [name], MD as PCP - General [facility] Medicine)  Extended Emergency Contact Information Primary Emergency Contact: Tyner,[name] Address: [address] [place], [place] [address] [name] of [place] Phone: [phone] Work Phone: [phone] Relation: Daughter Secondary Emergency Contact: [place] of [place] Phone: [phone] Relation: Niece    Allergies: Patient has no known allergies.  Chief Complaint  Patient presents with  . Medical Management of Chronic Issues    Routine Visit    HPI: Patient is 76 y.o. female who Is being seen for routine issues of GERD, paranoid, and history of stroke.  Past Medical History:  Diagnosis Date  . Angina pectoris ([place])   . Anxiety   . Asthma   . Barrett's esophagus   . Bronchitis   . 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 Chest pain   . Coronary artery disease    right carotid occlusion   . Depression   . Dysrhythmia    atrial fibrillation  . GERD (gastroesophageal reflux disease)   . Hyperlipidemia   . Hypertension   . Recurrent upper respiratory infection (URI)   . Stroke [facility])    2007, after left endarterectomy    Past Surgical History:  Procedure Laterality Date  . ABDOMINAL HYSTERECTOMY    . BALLOON DILATION  10/18/2012   Procedure: BALLOON DILATION;  Surgeon: Lear Ng, MD;  [facility]: [facility] ENDOSCOPY;  Service: Endoscopy;  Laterality: N/A;  . CARDIAC CATHETERIZATION    . CAROTID ENDARTERECTOMY     left, complicated by stroke  . CHOLECYSTECTOMY     incidental at time of colectomy  . COLONOSCOPY  12/01/2011   Procedure: COLONOSCOPY;  Surgeon: Lear Ng, MD;  [facility];  Service: Endoscopy;  Laterality: N/A;  . COLONOSCOPY  10/18/2012   Procedure: COLONOSCOPY;  Surgeon: Lear Ng, MD;   [facility];  Service: Endoscopy;  Laterality: N/A;  colonic dilation  . ESOPHAGOGASTRODUODENOSCOPY  12/01/2011   Procedure: ESOPHAGOGASTRODUODENOSCOPY (EGD);  Surgeon: Lear Ng, MD;  Location: [facility];  Service: Endoscopy;  Laterality: N/A;  . HOT HEMOSTASIS  12/01/2011   Procedure: HOT HEMOSTASIS (ARGON PLASMA COAGULATION/BICAP);  Surgeon: Lear Ng, MD;  Location: [facility];  Service: Endoscopy;  Laterality: N/A;  . 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 Always use your most recent med list.          acetaminophen 325 MG tablet Commonly known as:  TYLENOL Take 2 tablets (650 mg total) by mouth every 6 (six) hours as needed for mild pain (or Fever >/= 101).   albuterol 108 (90 Base) MCG/ACT inhaler Commonly known as:  PROVENTIL HFA;VENTOLIN HFA Inhale 2 puffs into the lungs every 6 (six) hours as needed for shortness of breath.   alendronate 70 MG tablet Commonly known as:  FOSAMAX Take 70 mg by mouth every Monday. Take with a full glass of water on an empty stomach.   aspirin EC 81 MG tablet Take 81 mg by mouth daily.   budesonide-formoterol 160-4.5 MCG/ACT inhaler Commonly known as:  SYMBICORT Inhale 2 puffs into the lungs 2 (two) times daily.   carvedilol 3.125 MG tablet Commonly known as:  COREG Take 1 tablet (3.125 mg total) by mouth 2 (two) times daily with a meal.   ENSURE Take 237 mLs by mouth daily.   ferrous sulfate 325 (65 FE) MG tablet Take 325 mg by mouth daily with breakfast.   fluticasone 50 MCG/ACT nasal spray Commonly known as:  FLONASE Place 1 spray into both nostrils daily.   guaifenesin 100 MG/5ML syrup Commonly known as:  ROBITUSSIN Take 200 mg by mouth every 6 (six) hours as needed for cough.   ipratropium-albuterol 0.5-2.5 (3) MG/3ML Soln Commonly  known as:  DUONEB Take 3 mLs by nebulization every 4 (four) hours as needed.   isosorbide mononitrate 30 MG 24 hr tablet Commonly known as:  IMDUR Take 30 mg by mouth daily.   levothyroxine 25 MCG tablet Commonly known as:  SYNTHROID, LEVOTHROID Take 25 mcg by mouth daily before breakfast.   loratadine 10 MG tablet Commonly known as:  CLARITIN Take 10 mg by mouth daily.   Melatonin 3 MG Tabs Take 1 tablet by mouth at bedtime.   mirtazapine 15 MG tablet Commonly known as:  REMERON Take 7.5 mg by mouth at bedtime. 1/2 TAB   nitroGLYCERIN 0.4 MG SL tablet Commonly known as:  NITROSTAT Place 0.4 mg under the tongue every 5 (five) minutes as needed for chest pain.   omeprazole 40 MG capsule Commonly known as:  PRILOSEC Take 40 mg by mouth daily.   polyethylene glycol packet Commonly known as:  MIRALAX / GLYCOLAX Take 17 g by mouth daily as needed.   QUEtiapine 50 MG tablet Commonly known as:  SEROQUEL Take 50 mg by mouth at bedtime.   Rivaroxaban 15 MG Tabs tablet Commonly known as:  XARELTO Take 15 mg by mouth daily.   sertraline 50 MG tablet Commonly known as:  ZOLOFT Take 50 mg by mouth at bedtime.   simvastatin 10 MG tablet Commonly known as:  ZOCOR Take 10 mg by mouth at bedtime.   sodium chloride 0.65 % nasal spray Commonly known as:  OCEAN Place 1 spray into the nose every 4 (four) hours as needed for congestion. Into both nares   tiotropium 18 MCG inhalation capsule Commonly known as:  SPIRIVA Place 18 mcg into inhaler and inhale daily.   traZODone 150 MG tablet Commonly known as:  DESYREL Take 50 mg by mouth at bedtime.   Vitamin D3 1000 units Caps Take 1 capsule by mouth daily.       No orders of the defined types were placed in this encounter.   Immunization History  Administered Date(s) Administered  . Influenza-Unspecified 08/26/2016  . PPD Test 08/16/2016  . Pneumococcal Polysaccharide-23 01/08/2012    Social History  Substance  Use Topics  . Smoking status: Former Smoker    Quit date: 04/09/2003  . Smokeless tobacco: Never Used  . Alcohol use No    Review of Systems  DATA OBTAINED: from patient GENERAL:  no fevers, fatigue, appetite changes SKIN: No itching, rash HEENT: No complaint RESPIRATORY: No cough, wheezing, SOB CARDIAC: No chest pain, palpitations, lower extremity edema  GI: No abdominal pain, No N/V/D or constipation, No heartburn or reflux  GU: No dysuria, frequency or urgency, or incontinence  MUSCULOSKELETAL: No unrelieved bone/joint pain NEUROLOGIC: No headache, dizziness  PSYCHIATRIC: No overt anxiety or sadness  Vitals:   04/20/17 1011  BP: (!) 144/78  Pulse: (!) 58  Resp: 18  Temp: 97.4 F (36.3 C)   Body mass index is 29.92 kg/m. Physical Exam  GENERAL APPEARANCE: Alert, conversant, No acute distress  SKIN: No diaphoresis rash HEENT: Unremarkable RESPIRATORY: Breathing is even, unlabored. Lung sounds are clear   CARDIOVASCULAR: Heart RRR no murmurs, rubs or gallops. No peripheral edema  GASTROINTESTINAL: Abdomen is soft, non-tender, not distended w/ normal bowel sounds.  GENITOURINARY: Bladder non tender, not distended  MUSCULOSKELETAL: No abnormal joints or musculature NEUROLOGIC: Cranial nerves 2-12 grossly intact. Moves all extremities PSYCHIATRIC: Mood and affect appropriate to situation, no behavioral issues  Patient Active Problem List   Diagnosis Date Noted  . Depression 04/24/2017  . Hypothyroidism 10/03/2016  . Paranoia ([place]) 10/03/2016  . LOC (loss of consciousness) ([place]) 09/02/2016  . 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 Dementia with behavioral disturbance 09/02/2016  . Asthma with acute exacerbation 08/21/2016  . Atrial fibrillation with RVR ([place]) 08/21/2016  . Elevated INR 08/21/2016  . Acute on chronic systolic CHF (congestive heart failure) ([place]) 08/21/2016  . Hyperlipidemia 08/21/2016  . Electrolyte abnormality 08/21/2016  . HCAP  (healthcare-associated pneumonia) 08/10/2016  . Sepsis ([place]) 08/09/2016  . Coronary atherosclerosis of native coronary artery 01/21/2014  . Obesity 01/17/2014  . Abdominal pain, generalized 10/18/2012  . GERD (gastroesophageal reflux disease)   . Mood disorder ([place])   . Anxiety   . 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[place])   . Stroke ([place])   . Chest pain 01/07/2012  . Asthma 01/07/2012  . A-fib ([place]) 01/07/2012  . 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 Anemia 01/07/2012  . ANEMIA 07/29/2010  . BARRETTS ESOPHAGUS 06/04/2010    CMP     Component Value Date/Time   NA 138 04/08/2017   K 5.1 04/08/2017   CL 101 08/12/2016 0527   CO2 26 08/12/2016 0527   GLUCOSE 123 (H) 08/12/2016 0527   BUN 41 (A) 04/08/2017   CREATININE 1.1 04/08/2017   CREATININE 0.88 08/12/2016 0527   CALCIUM 10.0 08/12/2016 0527   PROT 5.9 (L) 08/12/2016 0527   ALBUMIN 2.7 (L) 08/12/2016 0527   AST 21 09/29/2016   ALT 19 09/29/2016   ALKPHOS 61 09/29/2016   BILITOT 1.0 08/12/2016 0527   GFRNONAA >60 08/12/2016 0527   GFRAA >60 08/12/2016 0527    Recent Labs  08/09/16 2029  08/10/16 0613  08/12/16 0527  09/02/16 09/29/16 04/08/17  NA 132*  < > 132*  < > 134*  < > 138 140 138  K 3.3*  < > 4.0  --  4.7  < > 4.2 4.1 5.1  CL 98*  --  100*  --  101  --   --   --   --   CO2 24  --  21*  --  26  --   --   --   --   GLUCOSE 96  --  150*  --  123*  --   --   --   --   BUN 23*  < > 23*  < > 29*  < > 18 16 41*  CREATININE 0.96  < > 0.83  < > 0.88  < > 1.0 1.1 1.1  CALCIUM 10.1  --  9.9  --  10.0  --   --   --   --   < > = values in this interval not displayed.  Recent Labs  08/09/16 2029 08/12/16 0527 08/25/16 09/29/16  AST 100* 110*  21 21  ALT 80* 127* 25 19  ALKPHOS 93 88 61 61  BILITOT 1.2 1.0  --   --   PROT 6.6 5.9*  --   --   ALBUMIN 3.1* 2.7*  --   --     Recent Labs  08/09/16 2029  08/10/16 0613  08/12/16 0527 08/15/16 1053  09/02/16 09/29/16  04/08/17  WBC 15.8*  < > 17.5*  < > 23.0* 16.9*  < > 5.8 7.1 12.2  NEUTROABS 14.1*  --   --   --  20.5*  --   --   --   --   --   HGB 10.6*  < > 10.9*  < > 9.5* 9.9*  < > 11.5* 13.4 13.9  HCT 32.3*  < > 33.0*  < > 29.1* 30.7*  < > 35* 42 43  MCV 81.2  --  81.5  --  82.0 83.0  --   --   --   --   PLT 270  < > 262  < > 337 397  < > 199 189 177  < > = values in this interval not displayed.  Recent Labs  09/29/16  CHOL 172  LDLCALC 73  TRIG 128   No results found for: [facility] Lab Results  Component Value Date   TSH 4.09 04/08/2017   Lab Results  Component Value Date   HGBA1C 5.4 04/08/2017   Lab Results  Component Value Date   CHOL 172 09/29/2016   HDL 73 (A) 09/29/2016   LDLCALC 73 09/29/2016   TRIG 128 09/29/2016    Significant Diagnostic Results in last 30 days:  No results found.  Assessment and Plan  GERD (gastroesophageal reflux disease) No reported episodes of reflux; continue omeprazole 40 mg by mouth daily  Paranoia ([place]) Continuation do well; plan to continue Zocor 50 mg by mouth daily at bedtime  History of CVA with residual deficit Chronic and stable; continue ASA 81 mg daily and Coumadin which patient is on for atrial fib     Cindy [name] D. Cindy Coil, MD

## 2017-04-24 ENCOUNTER — Encounter: Payer: Self-pay | Admitting: Internal Medicine

## 2017-04-24 DIAGNOSIS — F329 Major depressive disorder, single episode, unspecified: Secondary | ICD-10-CM | POA: Insufficient documentation

## 2017-04-24 DIAGNOSIS — F32A Depression, unspecified: Secondary | ICD-10-CM | POA: Insufficient documentation

## 2017-04-24 NOTE — Assessment & Plan Note (Signed)
Chronic and stable; cont symbicort and spiriva scheduled, with prn alb and atrovent

## 2017-04-24 NOTE — Assessment & Plan Note (Signed)
Chronic and stable with no major declines;on no dementia specific meds; cont supportive care

## 2017-04-24 NOTE — Assessment & Plan Note (Signed)
Chronic and stable; cont remeron 15 mg qHS

## 2017-05-02 ENCOUNTER — Non-Acute Institutional Stay (SKILLED_NURSING_FACILITY): Payer: Medicare Other | Admitting: Internal Medicine

## 2017-05-02 DIAGNOSIS — K591 Functional diarrhea: Secondary | ICD-10-CM | POA: Diagnosis not present

## 2017-05-03 ENCOUNTER — Encounter: Payer: Self-pay | Admitting: Internal Medicine

## 2017-05-03 NOTE — Progress Notes (Addendum)
Location:  [place] Room Number: [phone] Place of Service:  SNF [phone])  Cindy [name]. [name], MD   Patient Care Team: [name], MD as PCP - General [facility] Medicine)  Extended Emergency Contact Information Primary Emergency Contact: Tyner,[name] Address: [address] [place], [place] [address] [name] of [place] Phone: [phone] Work Phone: [phone] Relation: Daughter Secondary Emergency Contact: [place] of [place] Phone: [phone] Relation: Niece    Allergies: Patient has no known allergies.  Chief Complaint  Patient presents with  . Acute Visit    Acute    HPI: Patient is 76 y.o. female who Who nursing asked me to see today because she is having "diarrhea again". Patient has had diarrhea on and off for the past month. Patient has had a course of Cipro with some improvement. Patient says that she has not had diarrhea for several days, she only had diarrhea on the Sunday after eating out with her family since that time she has been fine.  Past Medical History:  Diagnosis Date  . Angina pectoris ([place])   . Anxiety   . Asthma   . Barrett's esophagus   . Bronchitis   . 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 Chest pain   . Coronary artery disease    right carotid occlusion   . Depression   . Dysrhythmia    atrial fibrillation  . GERD (gastroesophageal reflux disease)   . Hyperlipidemia   . Hypertension   . Recurrent upper respiratory infection (URI)   . Stroke [facility])    2007, after left endarterectomy    Past Surgical History:  Procedure Laterality Date  . ABDOMINAL HYSTERECTOMY    . BALLOON DILATION  10/18/2012   Procedure: BALLOON DILATION;  Surgeon: Lear Ng, MD;  [facility];  Service: Endoscopy;  Laterality: N/A;  . CARDIAC CATHETERIZATION    . CAROTID ENDARTERECTOMY     left, complicated by stroke  . CHOLECYSTECTOMY     incidental at time of colectomy  .  COLONOSCOPY  12/01/2011   Procedure: COLONOSCOPY;  Surgeon: Lear Ng, MD;  [facility];  Service: Endoscopy;  Laterality: N/A;  . COLONOSCOPY  10/18/2012   Procedure: COLONOSCOPY;  Surgeon: Lear Ng, MD;  [facility];  Service: Endoscopy;  Laterality: N/A;  colonic dilation  . ESOPHAGOGASTRODUODENOSCOPY  12/01/2011   Procedure: ESOPHAGOGASTRODUODENOSCOPY (EGD);  Surgeon: Lear Ng, MD;  Location[facility];  Service: Endoscopy;  Laterality: N/A;  . HOT HEMOSTASIS  12/01/2011   Procedure: HOT HEMOSTASIS (ARGON PLASMA COAGULATION/BICAP);  Surgeon: Lear Ng, MD;  Location: [facility];  Service: Endoscopy;  Laterality: N/A;  . 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 Always use your most recent med list.          acetaminophen 325 MG tablet Commonly known as:  TYLENOL Take 2 tablets (650 mg total) by mouth every 6 (six) hours as needed for mild pain (or Fever >/= 101).   albuterol 108 (90 Base) MCG/ACT inhaler Commonly known as:  PROVENTIL HFA;VENTOLIN HFA Inhale 2 puffs into the lungs every 6 (six) hours as needed for shortness of breath.   alendronate 70  MG tablet Commonly known as:  FOSAMAX Take 70 mg by mouth every Monday. Take with a full glass of water on an empty stomach.   aspirin EC 81 MG tablet Take 81 mg by mouth daily.   budesonide-formoterol 160-4.5 MCG/ACT inhaler Commonly known as:  SYMBICORT Inhale 2 puffs into the lungs 2 (two) times daily.   carvedilol 3.125 MG tablet Commonly known as:  COREG Take 1 tablet (3.125 mg total) by mouth 2 (two) times daily with a meal.   ENSURE Take 237 mLs by mouth daily.   ferrous sulfate 325 (65 FE) MG tablet Take 325 mg by mouth daily with breakfast.   fluticasone 50 MCG/ACT nasal spray Commonly known as:   FLONASE Place 1 spray into both nostrils daily.   guaifenesin 100 MG/5ML syrup Commonly known as:  ROBITUSSIN Take 200 mg by mouth every 6 (six) hours as needed for cough.   ipratropium-albuterol 0.5-2.5 (3) MG/3ML Soln Commonly known as:  DUONEB Take 3 mLs by nebulization every 4 (four) hours as needed.   isosorbide mononitrate 30 MG 24 hr tablet Commonly known as:  IMDUR Take 30 mg by mouth daily.   levothyroxine 25 MCG tablet Commonly known as:  SYNTHROID, LEVOTHROID Take 25 mcg by mouth daily before breakfast.   loratadine 10 MG tablet Commonly known as:  CLARITIN Take 10 mg by mouth daily.   Melatonin 3 MG Tabs Take 1 tablet by mouth at bedtime.   mirtazapine 15 MG tablet Commonly known as:  REMERON Take 7.5 mg by mouth at bedtime. 1/2 TAB   nitroGLYCERIN 0.4 MG SL tablet Commonly known as:  NITROSTAT Place 0.4 mg under the tongue every 5 (five) minutes as needed for chest pain.   omeprazole 40 MG capsule Commonly known as:  PRILOSEC Take 40 mg by mouth daily.   polyethylene glycol packet Commonly known as:  MIRALAX / GLYCOLAX Take 17 g by mouth daily as needed.   QUEtiapine 50 MG tablet Commonly known as:  SEROQUEL Take 50 mg by mouth at bedtime.   Rivaroxaban 15 MG Tabs tablet Commonly known as:  XARELTO Take 15 mg by mouth daily.   sertraline 50 MG tablet Commonly known as:  ZOLOFT Take 50 mg by mouth at bedtime.   simvastatin 10 MG tablet Commonly known as:  ZOCOR Take 10 mg by mouth at bedtime.   sodium chloride 0.65 % nasal spray Commonly known as:  OCEAN Place 1 spray into the nose every 4 (four) hours as needed for congestion. Into both nares   tiotropium 18 MCG inhalation capsule Commonly known as:  SPIRIVA Place 18 mcg into inhaler and inhale daily.   traZODone 150 MG tablet Commonly known as:  DESYREL Take 50 mg by mouth at bedtime.   Vitamin D3 1000 units Caps Take 1 capsule by mouth daily.       No orders of the defined  types were placed in this encounter.   Immunization History  Administered Date(s) Administered  . Influenza-Unspecified 08/26/2016  . PPD Test 08/16/2016  . Pneumococcal Polysaccharide-23 01/08/2012    Social History  Substance Use Topics  . Smoking status: Former Smoker    Quit date: 04/09/2003  . Smokeless tobacco: Never Used  . Alcohol use No    Review of Systems  DATA OBTAINED: from patient, nurse GENERAL:  no fevers, fatigue, appetite changes SKIN: No itching, rash HEENT: No complaint RESPIRATORY: No cough, wheezing, SOB CARDIAC: No chest pain, palpitations, lower extremity edema  GI: No abdominal  pain, No N/V; D once on Sunday after eating out; no constipation, No heartburn or reflux  GU: No dysuria, frequency or urgency, or incontinence  MUSCULOSKELETAL: No unrelieved bone/joint pain NEUROLOGIC: No headache, dizziness  PSYCHIATRIC: No overt anxiety or sadness  Vitals:   05/02/17 0825  BP: (!) 144/78  Pulse: 74  Resp: 20  Temp: 98.6 F (37 C)   Body mass index is 30.15 kg/m. Physical Exam  GENERAL APPEARANCE: Alert, conversant, No acute distress  SKIN: No diaphoresis rash HEENT: Unremarkable RESPIRATORY: Breathing is even, unlabored. Lung sounds are clear   CARDIOVASCULAR: Heart RRR no murmurs, rubs or gallops. No peripheral edema  GASTROINTESTINAL: Abdomen is soft, non-tender, not distended w/ normal bowel sounds.  GENITOURINARY: Bladder non tender, not distended  MUSCULOSKELETAL: No abnormal joints or musculature NEUROLOGIC: Cranial nerves 2-12 grossly intact. Moves all extremities PSYCHIATRIC: Mood and affect appropriate to situation with some dementia, no behavioral issues  Patient Active Problem List   Diagnosis Date Noted  . Depression 04/24/2017  . Hypothyroidism 10/03/2016  . Paranoia ([place]) 10/03/2016  . LOC (loss of consciousness) ([place]) 09/02/2016  . 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 Dementia with behavioral disturbance  09/02/2016  . Asthma with acute exacerbation 08/21/2016  . Atrial fibrillation with RVR ([place]) 08/21/2016  . Elevated INR 08/21/2016  . Acute on chronic systolic CHF (congestive heart failure) ([place]) 08/21/2016  . Hyperlipidemia 08/21/2016  . Electrolyte abnormality 08/21/2016  . HCAP (healthcare-associated pneumonia) 08/10/2016  . Sepsis ([place]) 08/09/2016  . Coronary atherosclerosis of native coronary artery 01/21/2014  . Obesity 01/17/2014  . Abdominal pain, generalized 10/18/2012  . GERD (gastroesophageal reflux disease)   . Mood disorder ([place])   . Anxiety   . 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[place])   . Stroke ([place])   . Chest pain 01/07/2012  . Asthma 01/07/2012  . A-fib ([place]) 01/07/2012  . 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 Anemia 01/07/2012  . ANEMIA 07/29/2010  . BARRETTS ESOPHAGUS 06/04/2010    CMP     Component Value Date/Time   NA 138 04/08/2017   K 5.1 04/08/2017   CL 101 08/12/2016 0527   CO2 26 08/12/2016 0527   GLUCOSE 123 (H) 08/12/2016 0527   BUN 41 (A) 04/08/2017   CREATININE 1.1 04/08/2017   CREATININE 0.88 08/12/2016 0527   CALCIUM 10.0 08/12/2016 0527   PROT 5.9 (L) 08/12/2016 0527   ALBUMIN 2.7 (L) 08/12/2016 0527   AST 21 09/29/2016   ALT 19 09/29/2016   ALKPHOS 61 09/29/2016   BILITOT 1.0 08/12/2016 0527   GFRNONAA >60 08/12/2016 0527   GFRAA >60 08/12/2016 0527    Recent Labs  08/09/16 2029  08/10/16 0613  08/12/16 0527  09/02/16 09/29/16 04/08/17  NA 132*  < > 132*  < > 134*  < > 138 140 138  K 3.3*  < > 4.0  --  4.7  < > 4.2 4.1 5.1  CL 98*  --  100*  --  101  --   --   --   --   CO2 24  --  21*  --  26  --   --   --   --   GLUCOSE 96  --  150*  --  123*  --   --   --   --   BUN 23*  < > 23*  < > 29*  < > 18 16 41*  CREATININE 0.96  < >  0.83  < > 0.88  < > 1.0 1.1 1.1  CALCIUM 10.1  --  9.9  --  10.0  --   --   --   --   < > = values in this interval not displayed.  Recent Labs   08/09/16 2029 08/12/16 0527 08/25/16 09/29/16  AST 100* 110* 21 21  ALT 80* 127* 25 19  ALKPHOS 93 88 61 61  BILITOT 1.2 1.0  --   --   PROT 6.6 5.9*  --   --   ALBUMIN 3.1* 2.7*  --   --     Recent Labs  08/09/16 2029  08/10/16 0613  08/12/16 0527 08/15/16 1053  09/02/16 09/29/16 04/08/17  WBC 15.8*  < > 17.5*  < > 23.0* 16.9*  < > 5.8 7.1 12.2  NEUTROABS 14.1*  --   --   --  20.5*  --   --   --   --   --   HGB 10.6*  < > 10.9*  < > 9.5* 9.9*  < > 11.5* 13.4 13.9  HCT 32.3*  < > 33.0*  < > 29.1* 30.7*  < > 35* 42 43  MCV 81.2  --  81.5  --  82.0 83.0  --   --   --   --   PLT 270  < > 262  < > 337 397  < > 199 189 177  < > = values in this interval not displayed.  Recent Labs  09/29/16  CHOL 172  LDLCALC 73  TRIG 128   No results found for: [facility] Lab Results  Component Value Date   TSH 4.09 04/08/2017   Lab Results  Component Value Date   HGBA1C 5.4 04/08/2017   Lab Results  Component Value Date   CHOL 172 09/29/2016   HDL 73 (A) 09/29/2016   LDLCALC 73 09/29/2016   TRIG 128 09/29/2016    Significant Diagnostic Results in last 30 days:  No results found.  Assessment and Plan  RECURRENT DIARRHEA-I'm beginning to wonder if patient has a functional diarrhea, IBS; have written for Bentyl 10 mg by mouth 3 times a day     Hiedi Touchton D. [name], MD

## 2017-05-09 ENCOUNTER — Encounter: Payer: Self-pay | Admitting: Internal Medicine

## 2017-05-09 ENCOUNTER — Non-Acute Institutional Stay (SKILLED_NURSING_FACILITY): Payer: Medicare Other | Admitting: Internal Medicine

## 2017-05-09 DIAGNOSIS — K591 Functional diarrhea: Secondary | ICD-10-CM

## 2017-05-09 DIAGNOSIS — R1030 Lower abdominal pain, unspecified: Secondary | ICD-10-CM | POA: Diagnosis not present

## 2017-05-09 NOTE — Progress Notes (Signed)
Location:  [place] Room Number: [phone] Place of Service:  SNF [phone])  Cindy [name]. [name], MD  Patient Care Team: [name], MD as PCP - General [facility] Medicine)  Extended Emergency Contact Information Primary Emergency Contact: Tyner,[name] Address: [address] [place], [place] [address] [name] of [place] Phone: [phone] Work Phone: [phone] Relation: Daughter Secondary Emergency Contact: McDonald of [place] Phone: [phone] Relation: Niece    Allergies: Patient has no known allergies.  Chief Complaint  Patient presents with  . Acute Visit    Acute    HPI: Patient is 76 y.o. female who Nursing asked me to see because patient is having diarrhea and abdominal pain again. She's had this problem on and off and has been seen for it prior. Per preparation she is not having any abdominal pain at all and she has not had diarrhea since she put her on the blue pedal; bentyl; last week.  Past Medical History:  Diagnosis Date  . Angina pectoris ([place])   . Anxiety   . Asthma   . Barrett's esophagus   . Bronchitis   . 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 Chest pain   . Coronary artery disease    right carotid occlusion   . Depression   . Dysrhythmia    atrial fibrillation  . GERD (gastroesophageal reflux disease)   . Hyperlipidemia   . Hypertension   . Recurrent upper respiratory infection (URI)   . Stroke [facility])    2007, after left endarterectomy    Past Surgical History:  Procedure Laterality Date  . ABDOMINAL HYSTERECTOMY    . BALLOON DILATION  10/18/2012   Procedure: BALLOON DILATION;  Surgeon: Lear Ng, MD;  [facility];  Service: Endoscopy;  Laterality: N/A;  . CARDIAC CATHETERIZATION    . CAROTID ENDARTERECTOMY     left, complicated by stroke  . CHOLECYSTECTOMY     incidental at time of colectomy  . COLONOSCOPY  12/01/2011   Procedure: COLONOSCOPY;  Surgeon:  Lear Ng, MD;  [facility];  Service: Endoscopy;  Laterality: N/A;  . COLONOSCOPY  10/18/2012   Procedure: COLONOSCOPY;  Surgeon: Lear Ng, MD;  [facility];  Service: Endoscopy;  Laterality: N/A;  colonic dilation  . ESOPHAGOGASTRODUODENOSCOPY  12/01/2011   Procedure: ESOPHAGOGASTRODUODENOSCOPY (EGD);  Surgeon: Lear Ng, MD;  Location: [facility];  Service: Endoscopy;  Laterality: N/A;  . HOT HEMOSTASIS  12/01/2011   Procedure: HOT HEMOSTASIS (ARGON PLASMA COAGULATION/BICAP);  Surgeon: Lear Ng, MD;  Location: [facility];  Service: Endoscopy;  Laterality: N/A;  . 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 Always use your most recent med list.          acetaminophen 325 MG tablet Commonly known as:  TYLENOL Take 2 tablets (650 mg total) by mouth every 6 (six) hours as needed for mild pain (or Fever >/= 101).   albuterol 108 (90 Base) MCG/ACT inhaler Commonly known as:  PROVENTIL HFA;VENTOLIN HFA Inhale 2 puffs into the lungs every 6 (six) hours as needed for shortness of breath.   alendronate 70 MG tablet Commonly known as:  FOSAMAX Take 70  mg by mouth every Monday. Take with a full glass of water on an empty stomach.   aspirin EC 81 MG tablet Take 81 mg by mouth daily.   budesonide-formoterol 160-4.5 MCG/ACT inhaler Commonly known as:  SYMBICORT Inhale 2 puffs into the lungs 2 (two) times daily.   carvedilol 3.125 MG tablet Commonly known as:  COREG Take 1 tablet (3.125 mg total) by mouth 2 (two) times daily with a meal.   ENSURE Take 237 mLs by mouth daily.   ferrous sulfate 325 (65 FE) MG tablet Take 325 mg by mouth daily with breakfast.   fluticasone 50 MCG/ACT nasal spray Commonly known as:  FLONASE Place 1 spray into both nostrils daily.     guaifenesin 100 MG/5ML syrup Commonly known as:  ROBITUSSIN Take 200 mg by mouth every 6 (six) hours as needed for cough.   ipratropium-albuterol 0.5-2.5 (3) MG/3ML Soln Commonly known as:  DUONEB Take 3 mLs by nebulization every 4 (four) hours as needed.   isosorbide mononitrate 30 MG 24 hr tablet Commonly known as:  IMDUR Take 30 mg by mouth daily.   levothyroxine 25 MCG tablet Commonly known as:  SYNTHROID, LEVOTHROID Take 25 mcg by mouth daily before breakfast.   loratadine 10 MG tablet Commonly known as:  CLARITIN Take 10 mg by mouth daily.   Melatonin 3 MG Tabs Take 1 tablet by mouth at bedtime.   mirtazapine 15 MG tablet Commonly known as:  REMERON Take 7.5 mg by mouth at bedtime. 1/2 TAB   nitroGLYCERIN 0.4 MG SL tablet Commonly known as:  NITROSTAT Place 0.4 mg under the tongue every 5 (five) minutes as needed for chest pain.   omeprazole 40 MG capsule Commonly known as:  PRILOSEC Take 40 mg by mouth daily.   polyethylene glycol packet Commonly known as:  MIRALAX / GLYCOLAX Take 17 g by mouth daily as needed.   QUEtiapine 50 MG tablet Commonly known as:  SEROQUEL Take 50 mg by mouth at bedtime.   Rivaroxaban 15 MG Tabs tablet Commonly known as:  XARELTO Take 15 mg by mouth daily.   sertraline 50 MG tablet Commonly known as:  ZOLOFT Take 50 mg by mouth at bedtime.   simvastatin 10 MG tablet Commonly known as:  ZOCOR Take 10 mg by mouth at bedtime.   sodium chloride 0.65 % nasal spray Commonly known as:  OCEAN Place 1 spray into the nose every 4 (four) hours as needed for congestion. Into both nares   tiotropium 18 MCG inhalation capsule Commonly known as:  SPIRIVA Place 18 mcg into inhaler and inhale daily.   traZODone 150 MG tablet Commonly known as:  DESYREL Take 50 mg by mouth at bedtime.   Vitamin D3 1000 units Caps Take 1 capsule by mouth daily.       No orders of the defined types were placed in this encounter.   Immunization  History  Administered Date(s) Administered  . Influenza-Unspecified 08/26/2016  . PPD Test 08/16/2016  . Pneumococcal Polysaccharide-23 01/08/2012    Social History  Substance Use Topics  . Smoking status: Former Smoker    Quit date: 04/09/2003  . Smokeless tobacco: Never Used  . Alcohol use No    Review of Systems  DATA OBTAINED: from patient GENERAL:  no fevers, fatigue, appetite changes SKIN: No itching, rash HEENT: No complaint RESPIRATORY: No cough, wheezing, SOB CARDIAC: No chest pain, palpitations, lower extremity edema  GI: No abdominal pain, No N/V/D or constipation, No heartburn or reflux  GU: No dysuria, frequency or urgency, or incontinence  MUSCULOSKELETAL: No unrelieved bone/joint pain NEUROLOGIC: No headache, dizziness  PSYCHIATRIC: No overt anxiety or sadness  Vitals:   05/09/17 1414  BP: (!) 144/78  Pulse: 74  Resp: 20  Temp: 98.6 F (37 C)   Body mass index is 30.15 kg/m. Physical Exam  GENERAL APPEARANCE: Alert, conversant, No acute distress  SKIN: No diaphoresis rash HEENT: Unremarkable RESPIRATORY: Breathing is even, unlabored. Lung sounds are clear   CARDIOVASCULAR: Heart RRR no murmurs, rubs or gallops. No peripheral edema  GASTROINTESTINAL: Abdomen is soft, non-tender, not distended w/ normal bowel sounds.  GENITOURINARY: Bladder non tender, not distended  MUSCULOSKELETAL: No abnormal joints or musculature NEUROLOGIC: Cranial nerves 2-12 grossly intact. Moves all extremities PSYCHIATRIC: Mood and affect appropriate to situation, no behavioral issues  Patient Active Problem List   Diagnosis Date Noted  . Depression 04/24/2017  . Hypothyroidism 10/03/2016  . Paranoia ([place]) 10/03/2016  . LOC (loss of consciousness) ([place]) 09/02/2016  . 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 Dementia with behavioral disturbance 09/02/2016  . Asthma with acute exacerbation 08/21/2016  . Atrial fibrillation with RVR ([place]) 08/21/2016  . Elevated  INR 08/21/2016  . Acute on chronic systolic CHF (congestive heart failure) ([place]) 08/21/2016  . Hyperlipidemia 08/21/2016  . Electrolyte abnormality 08/21/2016  . HCAP (healthcare-associated pneumonia) 08/10/2016  . Sepsis ([place]) 08/09/2016  . Coronary atherosclerosis of native coronary artery 01/21/2014  . Obesity 01/17/2014  . Abdominal pain, generalized 10/18/2012  . GERD (gastroesophageal reflux disease)   . Mood disorder ([place])   . Anxiety   . 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[place])   . Stroke ([place])   . Chest pain 01/07/2012  . Asthma 01/07/2012  . A-fib ([place]) 01/07/2012  . 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 Anemia 01/07/2012  . ANEMIA 07/29/2010  . BARRETTS ESOPHAGUS 06/04/2010    CMP     Component Value Date/Time   NA 138 04/08/2017   K 5.1 04/08/2017   CL 101 08/12/2016 0527   CO2 26 08/12/2016 0527   GLUCOSE 123 (H) 08/12/2016 0527   BUN 41 (A) 04/08/2017   CREATININE 1.1 04/08/2017   CREATININE 0.88 08/12/2016 0527   CALCIUM 10.0 08/12/2016 0527   PROT 5.9 (L) 08/12/2016 0527   ALBUMIN 2.7 (L) 08/12/2016 0527   AST 21 09/29/2016   ALT 19 09/29/2016   ALKPHOS 61 09/29/2016   BILITOT 1.0 08/12/2016 0527   GFRNONAA >60 08/12/2016 0527   GFRAA >60 08/12/2016 0527    Recent Labs  08/09/16 2029  08/10/16 0613  08/12/16 0527  09/02/16 09/29/16 04/08/17  NA 132*  < > 132*  < > 134*  < > 138 140 138  K 3.3*  < > 4.0  --  4.7  < > 4.2 4.1 5.1  CL 98*  --  100*  --  101  --   --   --   --   CO2 24  --  21*  --  26  --   --   --   --   GLUCOSE 96  --  150*  --  123*  --   --   --   --   BUN 23*  < > 23*  < > 29*  < > 18 16 41*  CREATININE 0.96  < > 0.83  < > 0.88  < > 1.0 1.1 1.1  CALCIUM 10.1  --  9.9  --  10.0  --   --   --   --   < > = values in this interval not displayed.  Recent Labs  08/09/16 2029 08/12/16 0527 08/25/16 09/29/16  AST 100* 110* 21 21  ALT 80* 127* 25 19  ALKPHOS 93 88 61 61  BILITOT 1.2  1.0  --   --   PROT 6.6 5.9*  --   --   ALBUMIN 3.1* 2.7*  --   --     Recent Labs  08/09/16 2029  08/10/16 0613  08/12/16 0527 08/15/16 1053  09/02/16 09/29/16 04/08/17  WBC 15.8*  < > 17.5*  < > 23.0* 16.9*  < > 5.8 7.1 12.2  NEUTROABS 14.1*  --   --   --  20.5*  --   --   --   --   --   HGB 10.6*  < > 10.9*  < > 9.5* 9.9*  < > 11.5* 13.4 13.9  HCT 32.3*  < > 33.0*  < > 29.1* 30.7*  < > 35* 42 43  MCV 81.2  --  81.5  --  82.0 83.0  --   --   --   --   PLT 270  < > 262  < > 337 397  < > 199 189 177  < > = values in this interval not displayed.  Recent Labs  09/29/16  CHOL 172  LDLCALC 73  TRIG 128   No results found for: [facility] Lab Results  Component Value Date   TSH 4.09 04/08/2017   Lab Results  Component Value Date   HGBA1C 5.4 04/08/2017   Lab Results  Component Value Date   CHOL 172 09/29/2016   HDL 73 (A) 09/29/2016   LDLCALC 73 09/29/2016   TRIG 128 09/29/2016    Significant Diagnostic Results in last 30 days:  No results found.  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 [name], MD

## 2017-05-15 ENCOUNTER — Encounter: Payer: Self-pay | Admitting: Internal Medicine

## 2017-06-06 ENCOUNTER — Encounter: Payer: Self-pay | Admitting: Internal Medicine

## 2017-06-06 NOTE — Addendum Note (Signed)
Addended by: Inocencio Homes D on: 06/06/2017 07:47 PM   Modules accepted: Level of Service

## 2017-06-09 ENCOUNTER — Encounter: Payer: Self-pay | Admitting: Internal Medicine

## 2017-06-09 NOTE — Assessment & Plan Note (Signed)
No reported episodes of reflux; continue omeprazole 40 mg by mouth daily

## 2017-06-09 NOTE — Assessment & Plan Note (Signed)
Continuation do well; plan to continue Zocor 50 mg by mouth daily at bedtime

## 2017-06-09 NOTE — Assessment & Plan Note (Signed)
Chronic and stable; continue ASA 81 mg daily and Coumadin which patient is on for atrial fib

## 2017-06-14 ENCOUNTER — Encounter: Payer: Self-pay | Admitting: Internal Medicine

## 2017-06-14 ENCOUNTER — Non-Acute Institutional Stay (SKILLED_NURSING_FACILITY): Payer: Medicare Other | Admitting: Internal Medicine

## 2017-06-14 DIAGNOSIS — F419 Anxiety disorder, unspecified: Secondary | ICD-10-CM

## 2017-06-14 DIAGNOSIS — F39 Unspecified mood [affective] disorder: Secondary | ICD-10-CM | POA: Diagnosis not present

## 2017-06-14 DIAGNOSIS — F22 Delusional disorders: Secondary | ICD-10-CM

## 2017-06-14 NOTE — Progress Notes (Signed)
Location:  [place] Room Number: [phone] Place of Service:  SNF [phone])  Provider: Noah Delaine. [name], MD  Cindy Poll, MD  Patient Care Team: Cindy Poll, MD as PCP - General [facility] Medicine)  Extended Emergency Contact Information Primary Emergency Contact: Cindy [name] Address: [address] [place], [place] [address] Cindy [name] of [place] Phone: [phone] Work Phone: [phone] Relation: Daughter Secondary Emergency Contact: Cindy [name] Phone: [phone] Relation: Niece    Allergies: Patient has no known allergies.  Chief Complaint  Patient presents with  . Acute Visit    HPI: Patient is 76 y.o. female who Who nursing asked me to see. It was reported that patient had periods of paranoia/depression over the weekend and yesterday. [name] She felt some of her fellow patients before talking about her and going to hurt her. On call nurse gave new orders for a urine C&S, BuSpar 5 mg twice a day and increasing Seroquel to 75 mg by mouth at bedtime. Patient was reported earlier today to be agitated but somewhat better now Past Medical History:  Diagnosis Date  . Angina pectoris ([place])   . Anxiety   . Asthma   . Barrett's esophagus   . Bronchitis   . 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 Chest pain   . Coronary artery disease    right carotid occlusion   . Depression   . Dysrhythmia    atrial fibrillation  . GERD (gastroesophageal reflux disease)   . Hyperlipidemia   . Hypertension   . Recurrent upper respiratory infection (URI)   . Stroke [facility])    2007, after left endarterectomy    Past Surgical History:  Procedure Laterality Date  . ABDOMINAL HYSTERECTOMY    . BALLOON DILATION  10/18/2012   Procedure: BALLOON DILATION;  Surgeon: Lear Ng, MD;  [facility];  Service: Endoscopy;  Laterality: N/A;  . CARDIAC CATHETERIZATION    .  CAROTID ENDARTERECTOMY     left, complicated by stroke  . CHOLECYSTECTOMY     incidental at time of colectomy  . COLONOSCOPY  12/01/2011   Procedure: COLONOSCOPY;  Surgeon: Lear Ng, MD;  [facility];  Service: Endoscopy;  Laterality: N/A;  . COLONOSCOPY  10/18/2012   Procedure: COLONOSCOPY;  Surgeon: Lear Ng, MD;  [facility];  Service: Endoscopy;  Laterality: N/A;  colonic dilation  . ESOPHAGOGASTRODUODENOSCOPY  12/01/2011   Procedure: ESOPHAGOGASTRODUODENOSCOPY (EGD);  Surgeon: Lear Ng, MD;  [facility];  Service: Endoscopy;  Laterality: N/A;  . HOT HEMOSTASIS  12/01/2011   Procedure: HOT HEMOSTASIS (ARGON PLASMA COAGULATION/BICAP);  Surgeon: Lear Ng, MD;  Location[facility];  Service: Endoscopy;  Laterality: N/A;  . 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 Always use your most recent med list.          acetaminophen 325 MG tablet Commonly known as:  TYLENOL Take 2 tablets (650 mg total) by mouth every 6 (six) hours as needed for mild pain (or Fever >/= 101).   albuterol 108 (90 Base) MCG/ACT inhaler Commonly known as:  PROVENTIL HFA;VENTOLIN HFA Inhale 2  puffs into the lungs every 6 (six) hours as needed for shortness of breath.   alendronate 70 MG tablet Commonly known as:  FOSAMAX Take 70 mg by mouth every Monday. Take with a full glass of water on an empty stomach.   aspirin EC 81 MG tablet Take 81 mg by mouth daily.   budesonide-formoterol 160-4.5 MCG/ACT inhaler Commonly known as:  SYMBICORT Inhale 2 puffs into the lungs 2 (two) times daily.   carvedilol 3.125 MG tablet Commonly known as:  COREG Take 1 tablet (3.125 mg total) by mouth 2 (two) times daily with a meal.   ENSURE Take 237 mLs by mouth daily.   ferrous sulfate 325 (65  FE) MG tablet Take 325 mg by mouth daily with breakfast.   fluticasone 50 MCG/ACT nasal spray Commonly known as:  FLONASE Place 1 spray into both nostrils daily.   guaifenesin 100 MG/5ML syrup Commonly known as:  ROBITUSSIN Take 200 mg by mouth every 6 (six) hours as needed for cough.   ipratropium-albuterol 0.5-2.5 (3) MG/3ML Soln Commonly known as:  DUONEB Take 3 mLs by nebulization every 4 (four) hours as needed.   isosorbide mononitrate 30 MG 24 hr tablet Commonly known as:  IMDUR Take 30 mg by mouth daily.   levothyroxine 25 MCG tablet Commonly known as:  SYNTHROID, LEVOTHROID Take 25 mcg by mouth daily before breakfast.   loratadine 10 MG tablet Commonly known as:  CLARITIN Take 10 mg by mouth daily.   Melatonin 3 MG Tabs Take 1 tablet by mouth at bedtime.   mirtazapine 15 MG tablet Commonly known as:  REMERON Take 7.5 mg by mouth at bedtime. 1/2 TAB   nitroGLYCERIN 0.4 MG SL tablet Commonly known as:  NITROSTAT Place 0.4 mg under the tongue every 5 (five) minutes as needed for chest pain.   omeprazole 40 MG capsule Commonly known as:  PRILOSEC Take 40 mg by mouth daily.   polyethylene glycol packet Commonly known as:  MIRALAX / GLYCOLAX Take 17 g by mouth daily as needed.   QUEtiapine 50 MG tablet Commonly known as:  SEROQUEL Take 50 mg by mouth at bedtime.   Rivaroxaban 15 MG Tabs tablet Commonly known as:  XARELTO Take 15 mg by mouth daily.   sertraline 50 MG tablet Commonly known as:  ZOLOFT Take 50 mg by mouth at bedtime.   simvastatin 10 MG tablet Commonly known as:  ZOCOR Take 10 mg by mouth at bedtime.   sodium chloride 0.65 % nasal spray Commonly known as:  OCEAN Place 1 spray into the nose every 4 (four) hours as needed for congestion. Into both nares   tiotropium 18 MCG inhalation capsule Commonly known as:  SPIRIVA Place 18 mcg into inhaler and inhale daily.   traZODone 150 MG tablet Commonly known as:  DESYREL Take 50 mg by  mouth at bedtime.   Vitamin D3 1000 units Caps Take 1 capsule by mouth daily.       No orders of the defined types were placed in this encounter.   Immunization History  Administered Date(s) Administered  . Influenza-Unspecified 08/26/2016  . PPD Test 08/16/2016  . Pneumococcal Polysaccharide-23 01/08/2012    Social History  Substance Use Topics  . Smoking status: Former Smoker    Quit date: 04/09/2003  . Smokeless tobacco: Never Used  . Alcohol use No    Review of Systems  DATA OBTAINED: from patient, nurse- Patient says she is not in the mood to talk today GENERAL:  no fevers, fatigue, appetite changes SKIN: No itching, rash HEENT: No complaint RESPIRATORY: No cough, wheezing, SOB CARDIAC: No chest pain, palpitations, lower extremity edema  GI: No abdominal pain, No N/V;some diarrhea this a.m. and was able to get to the bathroom in time, no constipation constipation, No heartburn or reflux  GU: No dysuria, frequency or urgency, or incontinence  MUSCULOSKELETAL: No unrelieved bone/joint pain NEUROLOGIC: No headache, dizziness  PSYCHIATRIC: No overt anxiety or sadness  Vitals:   06/14/17 1612  BP: 93/63  Pulse: 67  Resp: 18  Temp: 97.8 F (36.6 C)   Body mass index is 31.05 kg/m. Physical Exam  GENERAL APPEARANCE: Alert, conversant, No acute distress  SKIN: No diaphoresis rash HEENT: Unremarkable RESPIRATORY: Breathing is even, unlabored. Lung sounds are clear   CARDIOVASCULAR: Heart RRR no murmurs, rubs or gallops. No peripheral edema  GASTROINTESTINAL: Abdomen is soft, non-tender, not distended w/ normal bowel sounds.  GENITOURINARY: Bladder non tender, not distended  MUSCULOSKELETAL: No abnormal joints or musculature NEUROLOGIC: Cranial nerves 2-12 grossly intact. Moves all extremities PSYCHIATRIC: Mood and affect Appears subdued today but expressed no paranoia, no behavioral issues   Patient Active Problem List   Diagnosis Date Noted  . Depression  04/24/2017  . Hypothyroidism 10/03/2016  . Paranoia ([place]) 10/03/2016  . LOC (loss of consciousness) ([place]) 09/02/2016  . 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 Dementia with behavioral disturbance 09/02/2016  . Asthma with acute exacerbation 08/21/2016  . Atrial fibrillation with RVR ([place]) 08/21/2016  . Elevated INR 08/21/2016  . Acute on chronic systolic CHF (congestive heart failure) ([place]) 08/21/2016  . Hyperlipidemia 08/21/2016  . Electrolyte abnormality 08/21/2016  . HCAP (healthcare-associated pneumonia) 08/10/2016  . Sepsis ([place]) 08/09/2016  . Coronary atherosclerosis of native coronary artery 01/21/2014  . Obesity 01/17/2014  . Abdominal pain, generalized 10/18/2012  . GERD (gastroesophageal reflux disease)   . Mood disorder ([place])   . Anxiety   . 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[place])   . Stroke ([place])   . Chest pain 01/07/2012  . Asthma 01/07/2012  . A-fib ([place]) 01/07/2012  . 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 Anemia 01/07/2012  . ANEMIA 07/29/2010  . BARRETTS ESOPHAGUS 06/04/2010    CMP     Component Value Date/Time   NA 138 04/08/2017   K 5.1 04/08/2017   CL 101 08/12/2016 0527   CO2 26 08/12/2016 0527   GLUCOSE 123 (H) 08/12/2016 0527   BUN 41 (A) 04/08/2017   CREATININE 1.1 04/08/2017   CREATININE 0.88 08/12/2016 0527   CALCIUM 10.0 08/12/2016 0527   PROT 5.9 (L) 08/12/2016 0527   ALBUMIN 2.7 (L) 08/12/2016 0527   AST 21 09/29/2016   ALT 19 09/29/2016   ALKPHOS 61 09/29/2016   BILITOT 1.0 08/12/2016 0527   GFRNONAA >60 08/12/2016 0527   GFRAA >60 08/12/2016 0527    Recent Labs  08/09/16 2029  08/10/16 0613  08/12/16 0527  09/02/16 09/29/16 04/08/17  NA 132*  < > 132*  < > 134*  < > 138 140 138  K 3.3*  < > 4.0  --  4.7  < > 4.2 4.1 5.1  CL 98*  --  100*  --  101  --   --   --   --   CO2 24  --  21*  --  26  --   --   --   --   GLUCOSE 96  --  150*  --  123*  --   --   --   --   BUN 23*  < >  23*  < > 29*  < > 18 16 41*  CREATININE 0.96  < > 0.83  < > 0.88  < > 1.0 1.1 1.1  CALCIUM 10.1  --  9.9  --  10.0  --   --   --   --   < > = values in this interval not displayed.  Recent Labs  08/09/16 2029 08/12/16 0527 08/25/16 09/29/16  AST 100* 110* 21 21  ALT 80* 127* 25 19  ALKPHOS 93 88 61 61  BILITOT 1.2 1.0  --   --   PROT 6.6 5.9*  --   --   ALBUMIN 3.1* 2.7*  --   --     Recent Labs  08/09/16 2029  08/10/16 0613  08/12/16 0527 08/15/16 1053  09/02/16 09/29/16 04/08/17  WBC 15.8*  < > 17.5*  < > 23.0* 16.9*  < > 5.8 7.1 12.2  NEUTROABS 14.1*  --   --   --  20.5*  --   --   --   --   --   HGB 10.6*  < > 10.9*  < > 9.5* 9.9*  < > 11.5* 13.4 13.9  HCT 32.3*  < > 33.0*  < > 29.1* 30.7*  < > 35* 42 43  MCV 81.2  --  81.5  --  82.0 83.0  --   --   --   --   PLT 270  < > 262  < > 337 397  < > 199 189 177  < > = values in this interval not displayed.  Recent Labs  09/29/16  CHOL 172  LDLCALC 73  TRIG 128   No results found for: [facility] Lab Results  Component Value Date   TSH 4.09 04/08/2017   Lab Results  Component Value Date   HGBA1C 5.4 04/08/2017   Lab Results  Component Value Date   CHOL 172 09/29/2016   HDL 73 (A) 09/29/2016   LDLCALC 73 09/29/2016   TRIG 128 09/29/2016    Significant Diagnostic Results in last 30 days:  No results found.  Assessment and Plan  PARANOIA/ANXIETY/MOOD DISORDER-I know patient from when she first came to the skilled nursing facility and she is not acting like she was then; the day she is not sitting in her door as she does usually and today she is not smiling like she usually is; today she does appear mildly subdued. Patient says she is not in the mood to talk to people today that there is nothing wrong with her; she denies being afraid her mood and language appear normal. I don't think that any of this was caused by UTI.  I do agree with BuSpar 5 mg twice a day and I do agree with increasing Seroquel to 75 mg at  bedtime. We'll monitor. If patient continues to be this subdued we will back off of one of her medications BuSpar or Seroquel and see if we get her more active again after several days.    Cindy Delaine. [name], MD

## 2017-06-15 ENCOUNTER — Encounter: Payer: Self-pay | Admitting: Internal Medicine

## 2017-06-16 ENCOUNTER — Inpatient Hospital Stay (HOSPITAL_COMMUNITY)
Admission: EM | Admit: 2017-06-16 | Discharge: 2017-06-19 | DRG: 689 | Disposition: A | Discharge status: Skilled Nursing Facility | Payer: Medicare Other | Attending: Internal Medicine | Admitting: Internal Medicine

## 2017-06-16 ENCOUNTER — Encounter (HOSPITAL_COMMUNITY): Payer: Self-pay | Admitting: Emergency Medicine

## 2017-06-16 DIAGNOSIS — Z8542 Personal history of malignant neoplasm of other parts of uterus: Secondary | ICD-10-CM

## 2017-06-16 DIAGNOSIS — Z90710 Acquired absence of both cervix and uterus: Secondary | ICD-10-CM

## 2017-06-16 DIAGNOSIS — I69398 Other sequelae of cerebral infarction: Secondary | ICD-10-CM

## 2017-06-16 DIAGNOSIS — D509 Iron deficiency anemia, unspecified: Secondary | ICD-10-CM | POA: Diagnosis present

## 2017-06-16 DIAGNOSIS — D649 Anemia, unspecified: Secondary | ICD-10-CM | POA: Diagnosis present

## 2017-06-16 DIAGNOSIS — I639 Cerebral infarction, unspecified: Secondary | ICD-10-CM | POA: Diagnosis present

## 2017-06-16 DIAGNOSIS — E039 Hypothyroidism, unspecified: Secondary | ICD-10-CM | POA: Diagnosis present

## 2017-06-16 DIAGNOSIS — K2270 Barrett's esophagus without dysplasia: Secondary | ICD-10-CM | POA: Diagnosis present

## 2017-06-16 DIAGNOSIS — K219 Gastro-esophageal reflux disease without esophagitis: Secondary | ICD-10-CM | POA: Diagnosis present

## 2017-06-16 DIAGNOSIS — Z66 Do not resuscitate: Secondary | ICD-10-CM | POA: Diagnosis present

## 2017-06-16 DIAGNOSIS — G9340 Encephalopathy, unspecified: Secondary | ICD-10-CM | POA: Diagnosis present

## 2017-06-16 DIAGNOSIS — N390 Urinary tract infection, site not specified: Principal | ICD-10-CM | POA: Diagnosis present

## 2017-06-16 DIAGNOSIS — Z7901 Long term (current) use of anticoagulants: Secondary | ICD-10-CM

## 2017-06-16 DIAGNOSIS — I10 Essential (primary) hypertension: Secondary | ICD-10-CM | POA: Diagnosis present

## 2017-06-16 DIAGNOSIS — R4182 Altered mental status, unspecified: Secondary | ICD-10-CM

## 2017-06-16 DIAGNOSIS — G9341 Metabolic encephalopathy: Secondary | ICD-10-CM | POA: Diagnosis present

## 2017-06-16 DIAGNOSIS — I4891 Unspecified atrial fibrillation: Secondary | ICD-10-CM | POA: Diagnosis present

## 2017-06-16 DIAGNOSIS — F39 Unspecified mood [affective] disorder: Secondary | ICD-10-CM | POA: Diagnosis present

## 2017-06-16 DIAGNOSIS — F419 Anxiety disorder, unspecified: Secondary | ICD-10-CM | POA: Diagnosis present

## 2017-06-16 DIAGNOSIS — Z8249 Family history of ischemic heart disease and other diseases of the circulatory system: Secondary | ICD-10-CM

## 2017-06-16 DIAGNOSIS — Z87891 Personal history of nicotine dependence: Secondary | ICD-10-CM

## 2017-06-16 DIAGNOSIS — Z7951 Long term (current) use of inhaled steroids: Secondary | ICD-10-CM

## 2017-06-16 DIAGNOSIS — B9620 Unspecified Escherichia coli [E. coli] as the cause of diseases classified elsewhere: Secondary | ICD-10-CM | POA: Diagnosis present

## 2017-06-16 DIAGNOSIS — F329 Major depressive disorder, single episode, unspecified: Secondary | ICD-10-CM | POA: Diagnosis present

## 2017-06-16 DIAGNOSIS — F0391 Unspecified dementia with behavioral disturbance: Secondary | ICD-10-CM | POA: Diagnosis present

## 2017-06-16 DIAGNOSIS — J45909 Unspecified asthma, uncomplicated: Secondary | ICD-10-CM | POA: Diagnosis present

## 2017-06-16 DIAGNOSIS — N3000 Acute cystitis without hematuria: Secondary | ICD-10-CM | POA: Diagnosis not present

## 2017-06-16 DIAGNOSIS — I480 Paroxysmal atrial fibrillation: Secondary | ICD-10-CM | POA: Diagnosis present

## 2017-06-16 DIAGNOSIS — E785 Hyperlipidemia, unspecified: Secondary | ICD-10-CM | POA: Diagnosis present

## 2017-06-16 DIAGNOSIS — I2510 Atherosclerotic heart disease of native coronary artery without angina pectoris: Secondary | ICD-10-CM | POA: Diagnosis present

## 2017-06-16 DIAGNOSIS — F03918 Unspecified dementia, unspecified severity, with other behavioral disturbance: Secondary | ICD-10-CM | POA: Diagnosis present

## 2017-06-16 DIAGNOSIS — Z8041 Family history of malignant neoplasm of ovary: Secondary | ICD-10-CM

## 2017-06-16 DIAGNOSIS — Z79899 Other long term (current) drug therapy: Secondary | ICD-10-CM

## 2017-06-16 DIAGNOSIS — I6930 Unspecified sequelae of cerebral infarction: Secondary | ICD-10-CM

## 2017-06-16 DIAGNOSIS — F22 Delusional disorders: Secondary | ICD-10-CM | POA: Diagnosis present

## 2017-06-16 DIAGNOSIS — F32A Depression, unspecified: Secondary | ICD-10-CM | POA: Diagnosis present

## 2017-06-16 DIAGNOSIS — Z7982 Long term (current) use of aspirin: Secondary | ICD-10-CM

## 2017-06-16 LAB — URINALYSIS, ROUTINE W REFLEX MICROSCOPIC
BILIRUBIN URINE: NEGATIVE
GLUCOSE, UA: NEGATIVE mg/dL
Ketones, ur: NEGATIVE mg/dL
NITRITE: POSITIVE — AB
Protein, ur: 100 mg/dL — AB
SPECIFIC GRAVITY, URINE: 1.006 (ref 1.005–1.030)
Squamous Epithelial / LPF: NONE SEEN
pH: 5 (ref 5.0–8.0)

## 2017-06-16 LAB — CBC
HCT: 39.4 % (ref 36.0–46.0)
Hemoglobin: 13.1 g/dL (ref 12.0–15.0)
MCH: 27.2 pg (ref 26.0–34.0)
MCHC: 33.2 g/dL (ref 30.0–36.0)
MCV: 81.9 fL (ref 78.0–100.0)
PLATELETS: 160 10*3/uL (ref 150–400)
RBC: 4.81 MIL/uL (ref 3.87–5.11)
RDW: 13.7 % (ref 11.5–15.5)
WBC: 8.9 10*3/uL (ref 4.0–10.5)

## 2017-06-16 LAB — BASIC METABOLIC PANEL
Anion gap: 7 (ref 5–15)
BUN: 13 mg/dL (ref 6–20)
CHLORIDE: 102 mmol/L (ref 101–111)
CO2: 26 mmol/L (ref 22–32)
CREATININE: 0.85 mg/dL (ref 0.44–1.00)
Calcium: 9.2 mg/dL (ref 8.9–10.3)
Glucose, Bld: 99 mg/dL (ref 65–99)
Potassium: 3.6 mmol/L (ref 3.5–5.1)
SODIUM: 135 mmol/L (ref 135–145)

## 2017-06-16 MED ORDER — QUETIAPINE FUMARATE 50 MG PO TABS
75.0000 mg | ORAL_TABLET | Freq: Every day | ORAL | Status: DC
  Administered 2017-06-17 – 2017-06-18 (×3): 75 mg via ORAL
  Filled 2017-06-16 (×3): qty 1

## 2017-06-16 MED ORDER — DEXTROSE 5 % IV SOLN
1.0000 g | Freq: Once | INTRAVENOUS | Status: AC
  Administered 2017-06-16: 1 g via INTRAVENOUS
  Filled 2017-06-16: qty 10

## 2017-06-16 NOTE — ED Triage Notes (Signed)
Pt presents to ED via [facility] from [facility] assisted living and rehab with dysuria and behavior changes.

## 2017-06-16 NOTE — ED Notes (Signed)
Pt refused to have vitals taken 

## 2017-06-16 NOTE — ED Notes (Signed)
Bed: WA01 Expected date:  Expected time:  Means of arrival:  Comments: EMS-[facility]

## 2017-06-16 NOTE — ED Provider Notes (Signed)
[facility] DEPT Provider Note   CSN: [id number] Arrival date & time: 06/16/17  1902     History   Chief Complaint Chief Complaint  Patient presents with  . Dysuria    HPI Cindy [name] is a 76 y.o. female.  76 year old female with past medical history including dementia, CVA, hypertension, atrial fibrillation, CAD who presents with dysuria and altered mental status. Patient has been complaining of dysuria which she states has been going on "for weeks and weeks." Nursing facility since documents noting change in behavior and increased aggression as well as manic behaviors. She denies pain then later reports abdominal pain and then later states she is hungry.   LEVEL 5 CAVEAT DUE TO AMS   The history is provided by the patient and the nursing home.    Past Medical History:  Diagnosis Date  . Angina pectoris ([place])   . Anxiety   . Asthma   . Barrett's esophagus   . Bronchitis   . 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 Chest pain   . Coronary artery disease    right carotid occlusion   . Depression   . Dysrhythmia    atrial fibrillation  . GERD (gastroesophageal reflux disease)   . Hyperlipidemia   . Hypertension   . Recurrent upper respiratory infection (URI)   . Stroke [facility])    2007, after left endarterectomy    Patient Active Problem List   Diagnosis Date Noted  . Depression 04/24/2017  . Hypothyroidism 10/03/2016  . Paranoia ([place]) 10/03/2016  . LOC (loss of consciousness) ([place]) 09/02/2016  . 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 Dementia with behavioral disturbance 09/02/2016  . Asthma with acute exacerbation 08/21/2016  . Atrial fibrillation with RVR ([place]) 08/21/2016  . Elevated INR 08/21/2016  . Acute on chronic systolic CHF (congestive heart failure) ([place]) 08/21/2016  . Hyperlipidemia 08/21/2016  . Electrolyte abnormality 08/21/2016  . HCAP (healthcare-associated pneumonia) 08/10/2016  . Sepsis ([place]) 08/09/2016  . Coronary atherosclerosis of  native coronary artery 01/21/2014  . Obesity 01/17/2014  . Abdominal pain, generalized 10/18/2012  . GERD (gastroesophageal reflux disease)   . Mood disorder ([place])   . Anxiety   . 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[place])   . Stroke ([place])   . Chest pain 01/07/2012  . Asthma 01/07/2012  . A-fib ([place]) 01/07/2012  . 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 Anemia 01/07/2012  . ANEMIA 07/29/2010  . BARRETTS ESOPHAGUS 06/04/2010    Past Surgical History:  Procedure Laterality Date  . ABDOMINAL HYSTERECTOMY    . BALLOON DILATION  10/18/2012   Procedure: BALLOON DILATION;  Surgeon: Lear Ng, MD;  Location: [facility];  Service: Endoscopy;  Laterality: N/A;  . CARDIAC CATHETERIZATION    . CAROTID ENDARTERECTOMY     left, complicated by stroke  . CHOLECYSTECTOMY     incidental at time of colectomy  . COLONOSCOPY  12/01/2011   Procedure: COLONOSCOPY;  Surgeon: Lear Ng, MD;  [facility];  Service: Endoscopy;  Laterality: N/A;  . COLONOSCOPY  10/18/2012   Procedure: COLONOSCOPY;  Surgeon: Lear Ng, MD;  [facility];  Service: Endoscopy;  Laterality: N/A;  colonic dilation  . ESOPHAGOGASTRODUODENOSCOPY  12/01/2011   Procedure: ESOPHAGOGASTRODUODENOSCOPY (EGD);  Surgeon: Lear Ng, MD;  Location: [facility];  Service: Endoscopy;  Laterality: N/A;  . HOT HEMOSTASIS  12/01/2011   Procedure: HOT HEMOSTASIS (ARGON PLASMA COAGULATION/BICAP);  Surgeon: Lear Ng,  MD;  [facility];  Service: Endoscopy;  Laterality: N/A;  . 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 Authorizing Provider  acetaminophen (TYLENOL) 325 MG tablet Take 2 tablets (650 mg total) by mouth every 6 (six) hours as needed for  mild pain (or Fever >/= 101). 08/14/16  Yes Johnson, Clanford L, MD  albuterol (PROVENTIL HFA;VENTOLIN HFA) 108 (90 BASE) MCG/ACT inhaler Inhale 2 puffs into the lungs every 6 (six) hours as needed for shortness of breath.    Yes [provider]  alendronate (FOSAMAX) 70 MG tablet Take 70 mg by mouth every Monday. Take with a full glass of water on an empty stomach.   Yes [provider]  aspirin EC 81 MG tablet Take 81 mg by mouth daily.   Yes [provider]  budesonide-formoterol (SYMBICORT) 160-4.5 MCG/ACT inhaler Inhale 2 puffs into the lungs 2 (two) times daily.   Yes [provider]  carvedilol (COREG) 3.125 MG tablet Take 1 tablet (3.125 mg total) by mouth 2 (two) times daily with a meal. 08/14/16  Yes Johnson, Clanford L, MD  Cholecalciferol (VITAMIN D3) 1000 units CAPS Take 1 capsule by mouth daily.   Yes [provider]  dicyclomine (BENTYL) 10 MG capsule Take 10 mg by mouth 4 (four) times daily -  before meals and at bedtime.   Yes [provider]  ENSURE (ENSURE) Take 237 mLs by mouth daily.    Yes [provider]  ferrous sulfate 325 (65 FE) MG tablet Take 325 mg by mouth daily with breakfast.    Yes [provider]  fluticasone (FLONASE) 50 MCG/ACT nasal spray Place 1 spray into both nostrils daily.    Yes [provider]  guaifenesin (ROBITUSSIN) 100 MG/5ML syrup Take 200 mg by mouth every 6 (six) hours as needed for cough.   Yes [provider]  ipratropium-albuterol (DUONEB) 0.5-2.5 (3) MG/3ML SOLN Take 3 mLs by nebulization every 4 (four) hours as needed.    Yes [provider]  isosorbide mononitrate (IMDUR) 30 MG 24 hr tablet Take 30 mg by mouth daily.   Yes [provider]  levothyroxine (SYNTHROID, LEVOTHROID) 25 MCG tablet Take 25 mcg by mouth daily before breakfast.    Yes [provider]  loratadine (CLARITIN) 10 MG tablet Take 10 mg by mouth daily.   Yes  [provider]  Melatonin 3 MG TABS Take 1 tablet by mouth at bedtime.    Yes [provider]  mirtazapine (REMERON) 15 MG tablet Take 7.5 mg by mouth at bedtime. 1/2 TAB   Yes [provider]  omeprazole (PRILOSEC) 40 MG capsule Take 40 mg by mouth daily.    Yes [provider]  polyethylene glycol (MIRALAX / GLYCOLAX) packet Take 17 g by mouth daily as needed.    Yes [provider]  QUEtiapine (SEROQUEL) 50 MG tablet Take 75 mg by mouth at bedtime.    Yes [provider]  Rivaroxaban (XARELTO) 15 MG TABS tablet Take 15 mg by mouth daily.   Yes [provider]  sertraline (ZOLOFT) 50 MG tablet Take 50 mg by mouth at bedtime.   Yes [provider]  simvastatin (ZOCOR) 10 MG tablet Take 10 mg by mouth  at bedtime.   Yes [provider]  sodium chloride (OCEAN) 0.65 % nasal spray Place 1 spray into the nose every 4 (four) hours as needed for congestion. Into both nares   Yes [provider]  tiotropium (SPIRIVA) 18 MCG inhalation capsule Place 18 mcg into inhaler and inhale daily.   Yes [provider]  traZODone (DESYREL) 150 MG tablet Take 100 mg by mouth at bedtime.    Yes [provider]  nitroGLYCERIN (NITROSTAT) 0.4 MG SL tablet Place 0.4 mg under the tongue every 5 (five) minutes as needed for chest pain.    [provider]    Family History Family History  Problem Relation Age of Onset  . Heart disease Mother   . Cancer Mother   . Heart attack Father   . Cancer Sister        ovarian    Social History Social History  Substance Use Topics  . Smoking status: Former Smoker    Quit date: 04/09/2003  . Smokeless tobacco: Never Used  . Alcohol use No     Allergies   Patient has no known allergies.   Review of Systems Review of Systems  Unable to perform ROS: Dementia     Physical Exam Updated Vital Signs BP (!) 190/83 (BP Location: Right Arm)   Pulse 67    Temp 98.4 F (36.9 C) (Oral)   Resp 18   Ht 5' (1.524 m)   Wt 72.1 kg (159 lb)   SpO2 97%   BMI 31.05 kg/m   Physical Exam  Constitutional: She appears well-developed and well-nourished. No distress.  HENT:  Head: Normocephalic and atraumatic.  Moist mucous membranes  Eyes: Conjunctivae are normal.  Neck: Neck supple.  Cardiovascular: Normal rate, regular rhythm and normal heart sounds.   No murmur heard. Pulmonary/Chest: Effort normal and breath sounds normal.  Abdominal: Soft. Bowel sounds are normal. She exhibits no distension. There is no tenderness.  Musculoskeletal: She exhibits no edema.  Neurological: She is alert.  Fluent speech, oriented to person, moving all 4 extremities  Skin: Skin is warm and dry.  Psychiatric:  Mildly agitated  Nursing note and vitals reviewed.    ED Treatments / Results  Labs (all labs ordered are listed, but only abnormal results are displayed) Labs Reviewed  URINALYSIS, ROUTINE W REFLEX MICROSCOPIC - Abnormal; Notable for the following:       Result Value   Color, Urine AMBER (*)    APPearance TURBID (*)    Hgb urine dipstick MODERATE (*)    Protein, ur 100 (*)    Nitrite POSITIVE (*)    Leukocytes, UA LARGE (*)    Bacteria, UA MANY (*)    All other components within normal limits  URINE CULTURE  BASIC METABOLIC PANEL  CBC    EKG  EKG Interpretation None       Radiology No results found.  Procedures Procedures (including critical care time)  Medications Ordered in ED Medications  QUEtiapine (SEROQUEL) tablet 75 mg (75 mg Oral Given 06/17/17 0010)  cefTRIAXone (ROCEPHIN) 1 g in dextrose 5 % 50 mL IVPB (0 g Intravenous Stopped 06/16/17 2347)     Initial Impression / Assessment and Plan / ED Course  I have reviewed the triage vital signs and the nursing notes.  Pertinent labs & imaging results that were available during my care of the patient were reviewed by me and considered in my medical decision making (see  chart for details).  PT sent from nursing facility with complaints of dysuria as well as altered behavior, documented as manic behaviors, aggression. She was comfortable on exam, mildly agitated, afebrile, VS notable for BP 190/83. She had no focal abdominal tenderness. Obtained above labs because of reports of altered mentation.  CBC and BMP reassuring. UA c/w infection, positive nitrites, leukocytes and WBC. On re-examination, pt was hostile telling me I smelled like liquor and yelling at me to leave. She does appear to have some aggressive behavior and confusion which may be driven by infection. I have given CTX and discussed admission with Dr. Hal Hope. PT admitted for further care. Final Clinical Impressions(s) / ED Diagnoses   Final diagnoses:  Acute cystitis without hematuria  Altered mental status, unspecified altered mental status type    New Prescriptions New Prescriptions   No medications on file     Little, Wenda [place], MD 06/17/17 [phone]

## 2017-06-17 ENCOUNTER — Encounter (HOSPITAL_COMMUNITY): Payer: Self-pay | Admitting: Internal Medicine

## 2017-06-17 DIAGNOSIS — B9620 Unspecified Escherichia coli [E. coli] as the cause of diseases classified elsewhere: Secondary | ICD-10-CM | POA: Diagnosis present

## 2017-06-17 DIAGNOSIS — I69398 Other sequelae of cerebral infarction: Secondary | ICD-10-CM | POA: Diagnosis not present

## 2017-06-17 DIAGNOSIS — I4891 Unspecified atrial fibrillation: Secondary | ICD-10-CM

## 2017-06-17 DIAGNOSIS — F329 Major depressive disorder, single episode, unspecified: Secondary | ICD-10-CM | POA: Diagnosis present

## 2017-06-17 DIAGNOSIS — K2270 Barrett's esophagus without dysplasia: Secondary | ICD-10-CM | POA: Diagnosis present

## 2017-06-17 DIAGNOSIS — Z66 Do not resuscitate: Secondary | ICD-10-CM | POA: Diagnosis present

## 2017-06-17 DIAGNOSIS — R4182 Altered mental status, unspecified: Secondary | ICD-10-CM

## 2017-06-17 DIAGNOSIS — G9340 Encephalopathy, unspecified: Secondary | ICD-10-CM | POA: Diagnosis present

## 2017-06-17 DIAGNOSIS — Z8249 Family history of ischemic heart disease and other diseases of the circulatory system: Secondary | ICD-10-CM | POA: Diagnosis not present

## 2017-06-17 DIAGNOSIS — D508 Other iron deficiency anemias: Secondary | ICD-10-CM | POA: Diagnosis not present

## 2017-06-17 DIAGNOSIS — K219 Gastro-esophageal reflux disease without esophagitis: Secondary | ICD-10-CM | POA: Diagnosis present

## 2017-06-17 DIAGNOSIS — F0391 Unspecified dementia with behavioral disturbance: Secondary | ICD-10-CM | POA: Diagnosis present

## 2017-06-17 DIAGNOSIS — G301 Alzheimer's disease with late onset: Secondary | ICD-10-CM | POA: Diagnosis not present

## 2017-06-17 DIAGNOSIS — N390 Urinary tract infection, site not specified: Secondary | ICD-10-CM | POA: Diagnosis not present

## 2017-06-17 DIAGNOSIS — G9341 Metabolic encephalopathy: Secondary | ICD-10-CM | POA: Diagnosis present

## 2017-06-17 DIAGNOSIS — E034 Atrophy of thyroid (acquired): Secondary | ICD-10-CM

## 2017-06-17 DIAGNOSIS — Z7901 Long term (current) use of anticoagulants: Secondary | ICD-10-CM | POA: Diagnosis not present

## 2017-06-17 DIAGNOSIS — N3000 Acute cystitis without hematuria: Secondary | ICD-10-CM | POA: Diagnosis present

## 2017-06-17 DIAGNOSIS — Z8542 Personal history of malignant neoplasm of other parts of uterus: Secondary | ICD-10-CM | POA: Diagnosis not present

## 2017-06-17 DIAGNOSIS — E039 Hypothyroidism, unspecified: Secondary | ICD-10-CM | POA: Diagnosis present

## 2017-06-17 DIAGNOSIS — Z87891 Personal history of nicotine dependence: Secondary | ICD-10-CM | POA: Diagnosis not present

## 2017-06-17 DIAGNOSIS — D509 Iron deficiency anemia, unspecified: Secondary | ICD-10-CM | POA: Diagnosis present

## 2017-06-17 DIAGNOSIS — I2510 Atherosclerotic heart disease of native coronary artery without angina pectoris: Secondary | ICD-10-CM | POA: Diagnosis present

## 2017-06-17 DIAGNOSIS — Z90710 Acquired absence of both cervix and uterus: Secondary | ICD-10-CM | POA: Diagnosis not present

## 2017-06-17 DIAGNOSIS — Z8041 Family history of malignant neoplasm of ovary: Secondary | ICD-10-CM | POA: Diagnosis not present

## 2017-06-17 DIAGNOSIS — I480 Paroxysmal atrial fibrillation: Secondary | ICD-10-CM | POA: Diagnosis present

## 2017-06-17 DIAGNOSIS — E785 Hyperlipidemia, unspecified: Secondary | ICD-10-CM | POA: Diagnosis present

## 2017-06-17 DIAGNOSIS — I10 Essential (primary) hypertension: Secondary | ICD-10-CM | POA: Diagnosis present

## 2017-06-17 DIAGNOSIS — F419 Anxiety disorder, unspecified: Secondary | ICD-10-CM | POA: Diagnosis present

## 2017-06-17 DIAGNOSIS — J45909 Unspecified asthma, uncomplicated: Secondary | ICD-10-CM | POA: Diagnosis present

## 2017-06-17 LAB — BASIC METABOLIC PANEL
ANION GAP: 9 (ref 5–15)
BUN: 13 mg/dL (ref 6–20)
CHLORIDE: 105 mmol/L (ref 101–111)
CO2: 24 mmol/L (ref 22–32)
Calcium: 9 mg/dL (ref 8.9–10.3)
Creatinine, Ser: 0.72 mg/dL (ref 0.44–1.00)
GFR calc non Af Amer: >60 mL/min (ref >60)
Glucose, Bld: 89 mg/dL (ref 65–99)
POTASSIUM: 3.7 mmol/L (ref 3.5–5.1)
SODIUM: 138 mmol/L (ref 135–145)

## 2017-06-17 LAB — CBC
HEMATOCRIT: 38.1 % (ref 36.0–46.0)
HEMOGLOBIN: 12.9 g/dL (ref 12.0–15.0)
MCH: 28.2 pg (ref 26.0–34.0)
MCHC: 33.9 g/dL (ref 30.0–36.0)
MCV: 83.2 fL (ref 78.0–100.0)
Platelets: 129 10*3/uL — ABNORMAL LOW (ref 150–400)
RBC: 4.58 MIL/uL (ref 3.87–5.11)
RDW: 14 % (ref 11.5–15.5)
WBC: 6.7 10*3/uL (ref 4.0–10.5)

## 2017-06-17 LAB — MRSA PCR SCREENING: MRSA by PCR: NEGATIVE

## 2017-06-17 MED ORDER — TIOTROPIUM BROMIDE MONOHYDRATE 18 MCG IN CAPS
18.0000 ug | ORAL_CAPSULE | Freq: Every day | RESPIRATORY_TRACT | Status: DC
  Administered 2017-06-17 – 2017-06-19 (×3): 18 ug via RESPIRATORY_TRACT
  Filled 2017-06-17: qty 5

## 2017-06-17 MED ORDER — MELATONIN 3 MG PO TABS
1.0000 | ORAL_TABLET | Freq: Every day | ORAL | Status: DC
Start: 2017-06-17 — End: 2017-06-17

## 2017-06-17 MED ORDER — LORATADINE 10 MG PO TABS
10.0000 mg | ORAL_TABLET | Freq: Every day | ORAL | Status: DC
  Administered 2017-06-17 – 2017-06-19 (×3): 10 mg via ORAL
  Filled 2017-06-17 (×3): qty 1

## 2017-06-17 MED ORDER — DICYCLOMINE HCL 10 MG PO CAPS
10.0000 mg | ORAL_CAPSULE | Freq: Three times a day (TID) | ORAL | Status: DC
  Administered 2017-06-17 – 2017-06-19 (×8): 10 mg via ORAL
  Filled 2017-06-17 (×8): qty 1

## 2017-06-17 MED ORDER — MOMETASONE FURO-FORMOTEROL FUM 200-5 MCG/ACT IN AERO
2.0000 | INHALATION_SPRAY | Freq: Two times a day (BID) | RESPIRATORY_TRACT | Status: DC
  Administered 2017-06-17 – 2017-06-19 (×4): 2 via RESPIRATORY_TRACT
  Filled 2017-06-17: qty 8.8

## 2017-06-17 MED ORDER — SIMVASTATIN 10 MG PO TABS
10.0000 mg | ORAL_TABLET | Freq: Every day | ORAL | Status: DC
  Administered 2017-06-17 – 2017-06-18 (×2): 10 mg via ORAL
  Filled 2017-06-17 (×2): qty 1

## 2017-06-17 MED ORDER — PANTOPRAZOLE SODIUM 40 MG PO TBEC
40.0000 mg | DELAYED_RELEASE_TABLET | Freq: Every day | ORAL | Status: DC
  Administered 2017-06-17 – 2017-06-19 (×3): 40 mg via ORAL
  Filled 2017-06-17 (×3): qty 1

## 2017-06-17 MED ORDER — VITAMIN D3 25 MCG (1000 UNIT) PO TABS
1000.0000 [IU] | ORAL_TABLET | Freq: Every day | ORAL | Status: DC
  Administered 2017-06-17 – 2017-06-19 (×3): 1000 [IU] via ORAL
  Filled 2017-06-17 (×3): qty 1

## 2017-06-17 MED ORDER — ONDANSETRON HCL 4 MG PO TABS: 4.0000 mg | ORAL_TABLET | Freq: Four times a day (QID) | ORAL | Status: DC | PRN

## 2017-06-17 MED ORDER — NITROGLYCERIN 0.4 MG SL SUBL: 0.4000 mg | SUBLINGUAL_TABLET | SUBLINGUAL | Status: DC | PRN

## 2017-06-17 MED ORDER — HYDRALAZINE HCL 20 MG/ML IJ SOLN
10.0000 mg | INTRAMUSCULAR | Status: DC | PRN
  Administered 2017-06-17 – 2017-06-19 (×2): 10 mg via INTRAVENOUS
  Filled 2017-06-17 (×2): qty 0.5

## 2017-06-17 MED ORDER — FERROUS SULFATE 325 (65 FE) MG PO TABS
325.0000 mg | ORAL_TABLET | Freq: Every day | ORAL | Status: DC
  Administered 2017-06-17 – 2017-06-19 (×3): 325 mg via ORAL
  Filled 2017-06-17 (×3): qty 1

## 2017-06-17 MED ORDER — LEVOTHYROXINE SODIUM 25 MCG PO TABS
25.0000 ug | ORAL_TABLET | Freq: Every day | ORAL | Status: DC
  Administered 2017-06-17 – 2017-06-19 (×3): 25 ug via ORAL
  Filled 2017-06-17 (×3): qty 1

## 2017-06-17 MED ORDER — MIRTAZAPINE 7.5 MG PO TABS
7.5000 mg | ORAL_TABLET | Freq: Every day | ORAL | Status: DC
  Administered 2017-06-17 – 2017-06-18 (×2): 7.5 mg via ORAL
  Filled 2017-06-17 (×2): qty 1

## 2017-06-17 MED ORDER — ISOSORBIDE MONONITRATE ER 30 MG PO TB24
30.0000 mg | ORAL_TABLET | Freq: Every day | ORAL | Status: DC
  Administered 2017-06-17 – 2017-06-19 (×3): 30 mg via ORAL
  Filled 2017-06-17 (×3): qty 1

## 2017-06-17 MED ORDER — ENSURE ENLIVE PO LIQD
237.0000 mL | Freq: Every day | ORAL | Status: DC
  Administered 2017-06-17: 237 mL via ORAL

## 2017-06-17 MED ORDER — RIVAROXABAN 15 MG PO TABS
15.0000 mg | ORAL_TABLET | Freq: Every day | ORAL | Status: DC
  Administered 2017-06-17 – 2017-06-18 (×2): 15 mg via ORAL
  Filled 2017-06-17 (×2): qty 1

## 2017-06-17 MED ORDER — ASPIRIN EC 81 MG PO TBEC
81.0000 mg | DELAYED_RELEASE_TABLET | Freq: Every day | ORAL | Status: DC
  Administered 2017-06-17 – 2017-06-19 (×3): 81 mg via ORAL
  Filled 2017-06-17 (×3): qty 1

## 2017-06-17 MED ORDER — DEXTROSE 5 % IV SOLN
1.0000 g | INTRAVENOUS | Status: DC
  Administered 2017-06-17 – 2017-06-18 (×2): 1 g via INTRAVENOUS
  Filled 2017-06-17 (×2): qty 10

## 2017-06-17 MED ORDER — SODIUM CHLORIDE 0.9 % IV SOLN
INTRAVENOUS | Status: DC
  Administered 2017-06-17 (×2): via INTRAVENOUS

## 2017-06-17 MED ORDER — TRAZODONE HCL 100 MG PO TABS
100.0000 mg | ORAL_TABLET | Freq: Every day | ORAL | Status: DC
  Administered 2017-06-17 – 2017-06-18 (×3): 100 mg via ORAL
  Filled 2017-06-17 (×3): qty 1

## 2017-06-17 MED ORDER — SERTRALINE HCL 50 MG PO TABS
50.0000 mg | ORAL_TABLET | Freq: Every day | ORAL | Status: DC
  Administered 2017-06-17 – 2017-06-18 (×3): 50 mg via ORAL
  Filled 2017-06-17 (×3): qty 1

## 2017-06-17 MED ORDER — CARVEDILOL 3.125 MG PO TABS
3.1250 mg | ORAL_TABLET | Freq: Two times a day (BID) | ORAL | Status: DC
  Administered 2017-06-17 – 2017-06-19 (×5): 3.125 mg via ORAL
  Filled 2017-06-17 (×5): qty 1

## 2017-06-17 MED ORDER — ACETAMINOPHEN 325 MG PO TABS: 650.0000 mg | ORAL_TABLET | Freq: Four times a day (QID) | ORAL | Status: DC | PRN

## 2017-06-17 MED ORDER — ONDANSETRON HCL 4 MG/2ML IJ SOLN: 4.0000 mg | Freq: Four times a day (QID) | INTRAMUSCULAR | Status: DC | PRN

## 2017-06-17 MED ORDER — POLYETHYLENE GLYCOL 3350 17 G PO PACK
17.0000 g | PACK | Freq: Every day | ORAL | Status: DC | PRN
Start: 2017-06-17 — End: 2017-06-19

## 2017-06-17 MED ORDER — FLUTICASONE PROPIONATE 50 MCG/ACT NA SUSP
1.0000 | Freq: Every day | NASAL | Status: DC
  Administered 2017-06-17: 1 via NASAL
  Filled 2017-06-17: qty 16

## 2017-06-17 MED ORDER — ACETAMINOPHEN 650 MG RE SUPP: 650.0000 mg | Freq: Four times a day (QID) | RECTAL | Status: DC | PRN

## 2017-06-17 MED ORDER — GUAIFENESIN 100 MG/5ML PO SYRP
200.0000 mg | ORAL_SOLUTION | Freq: Four times a day (QID) | ORAL | Status: DC | PRN
  Filled 2017-06-17: qty 10

## 2017-06-17 NOTE — Progress Notes (Signed)
   06/17/17 1200  Clinical Encounter Type  Visited With Patient  Visit Type Initial;Psychological support;Spiritual support  Referral From Nurse  Consult/Referral To Chaplain  Spiritual Encounters  Spiritual Needs Emotional;Other (Comment) (Pastoral Conversation/Support)  Stress Factors  Patient Stress Factors Health changes   I visited with the patient per referral from the nurse. The patient was confused throughout our visit and was unable to state her needs. The patient fixated on her IV alarm until a nurse came and turned it off. The patient talked about feeling "crazy." The patient stated, "I can't think straight."   Please, contact Spiritual Care for further assistance.   [place] M.Div.

## 2017-06-17 NOTE — ED Notes (Signed)
Will give report to nurse on the floor. Patient being taken to 4E bed 43

## 2017-06-17 NOTE — H&P (Signed)
History and Physical    Cindy [name] OZD:[id number] DOB: 12-11-1940 DOA: 06/16/2017  PCP: [name], MD  Patient coming from: Nursing home.  Chief Complaint: Confusion and agitation.  HPI: Cindy [name] is a 76 y.o. female with history of dementia, atrial fibrillation, hypertension, asthma was brought from the nursing home after patient was found to be increasingly confused from baseline. Patient also was agitated. Most of the history was obtained from ER physician.    ED Course: In the ER patient was found be confused. UA shows features consistent with UTI and patient has been admitted for further observation and was started on antibiotics. On exam patient is resting and is only oriented to her name.  Review of Systems: As per HPI, rest all negative.   Past Medical History:  Diagnosis Date  . Angina pectoris ([place])   . Anxiety   . Asthma   . Barrett's esophagus   . Bronchitis   . 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 Chest pain   . Coronary artery disease    right carotid occlusion   . Depression   . Dysrhythmia    atrial fibrillation  . GERD (gastroesophageal reflux disease)   . Hyperlipidemia   . Hypertension   . Recurrent upper respiratory infection (URI)   . Stroke [facility])    2007, after left endarterectomy    Past Surgical History:  Procedure Laterality Date  . ABDOMINAL HYSTERECTOMY    . BALLOON DILATION  10/18/2012   Procedure: BALLOON DILATION;  Surgeon: Lear Ng, MD;  [facility];  Service: Endoscopy;  Laterality: N/A;  . CARDIAC CATHETERIZATION    . CAROTID ENDARTERECTOMY     left, complicated by stroke  . CHOLECYSTECTOMY     incidental at time of colectomy  . COLONOSCOPY  12/01/2011   Procedure: COLONOSCOPY;  Surgeon: Lear Ng, MD;  [facility];  Service: Endoscopy;  Laterality: N/A;  . COLONOSCOPY  10/18/2012   Procedure: COLONOSCOPY;  Surgeon: Lear Ng, MD;  [facility];  Service: Endoscopy;   Laterality: N/A;  colonic dilation  . ESOPHAGOGASTRODUODENOSCOPY  12/01/2011   Procedure: ESOPHAGOGASTRODUODENOSCOPY (EGD);  Surgeon: Lear Ng, MD;  Location: [facility];  Service: Endoscopy;  Laterality: N/A;  . HOT HEMOSTASIS  12/01/2011   Procedure: HOT HEMOSTASIS (ARGON PLASMA COAGULATION/BICAP);  Surgeon: Lear Ng, MD;  Location: [facility];  Service: Endoscopy;  Laterality: N/A;  . 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 She has never used smokeless tobacco. She reports that she does not drink alcohol or use drugs.  No Known Allergies  Family History  Problem Relation Age of Onset  . Heart disease Mother   . Cancer Mother   . Heart attack Father   . Cancer Sister        ovarian    Prior to Admission medications   Medication Sig Start Date End Date Taking? Authorizing Provider  acetaminophen (TYLENOL) 325 MG tablet Take 2 tablets (650 mg total) by mouth every 6 (six) hours as needed for mild pain (or Fever >/= 101). 08/14/16  Yes Johnson, Clanford L, MD  albuterol (PROVENTIL HFA;VENTOLIN HFA) 108 (90 BASE) MCG/ACT inhaler Inhale 2 puffs into the lungs every 6 (six) hours as needed for shortness of breath.    Yes [provider]  alendronate (FOSAMAX) 70 MG tablet Take 70  mg by mouth every Monday. Take with a full glass of water on an empty stomach.   Yes [provider]  aspirin EC 81 MG tablet Take 81 mg by mouth daily.   Yes [provider]  budesonide-formoterol (SYMBICORT) 160-4.5 MCG/ACT inhaler Inhale 2 puffs into the lungs 2 (two) times daily.   Yes [provider]  carvedilol (COREG) 3.125 MG tablet Take 1 tablet (3.125 mg total) by mouth 2 (two) times daily with a meal. 08/14/16  Yes Johnson, Clanford L, MD  Cholecalciferol (VITAMIN D3) 1000 units CAPS Take 1 capsule by mouth daily.   Yes [provider]    dicyclomine (BENTYL) 10 MG capsule Take 10 mg by mouth 4 (four) times daily -  before meals and at bedtime.   Yes [provider]  ENSURE (ENSURE) Take 237 mLs by mouth daily.    Yes [provider]  ferrous sulfate 325 (65 FE) MG tablet Take 325 mg by mouth daily with breakfast.    Yes [provider]  fluticasone (FLONASE) 50 MCG/ACT nasal spray Place 1 spray into both nostrils daily.    Yes [provider]  guaifenesin (ROBITUSSIN) 100 MG/5ML syrup Take 200 mg by mouth every 6 (six) hours as needed for cough.   Yes [provider]  ipratropium-albuterol (DUONEB) 0.5-2.5 (3) MG/3ML SOLN Take 3 mLs by nebulization every 4 (four) hours as needed.    Yes [provider]  isosorbide mononitrate (IMDUR) 30 MG 24 hr tablet Take 30 mg by mouth daily.   Yes [provider]  levothyroxine (SYNTHROID, LEVOTHROID) 25 MCG tablet Take 25 mcg by mouth daily before breakfast.    Yes [provider]  loratadine (CLARITIN) 10 MG tablet Take 10 mg by mouth daily.   Yes [provider]  Melatonin 3 MG TABS Take 1 tablet by mouth at bedtime.    Yes [provider]  mirtazapine (REMERON) 15 MG tablet Take 7.5 mg by mouth at bedtime. 1/2 TAB   Yes [provider]  omeprazole (PRILOSEC) 40 MG capsule Take 40 mg by mouth daily.    Yes [provider]  polyethylene glycol (MIRALAX / GLYCOLAX) packet Take 17 g by mouth daily as needed.    Yes [provider]  QUEtiapine (SEROQUEL) 50 MG tablet Take 75 mg by mouth at bedtime.    Yes [provider]  Rivaroxaban (XARELTO) 15 MG TABS tablet Take 15 mg by mouth daily.   Yes [provider]  sertraline (ZOLOFT) 50 MG tablet Take 50 mg by mouth at bedtime.   Yes [provider]  simvastatin (ZOCOR) 10 MG tablet Take 10 mg by mouth at bedtime.   Yes [provider]  sodium chloride (OCEAN) 0.65 % nasal spray Place 1 spray into  the nose every 4 (four) hours as needed for congestion. Into both nares   Yes [provider]  tiotropium (SPIRIVA) 18 MCG inhalation capsule Place 18 mcg into inhaler and inhale daily.   Yes [provider]  traZODone (DESYREL) 150 MG tablet Take 100 mg by mouth at bedtime.    Yes [provider]  nitroGLYCERIN (NITROSTAT) 0.4 MG SL tablet Place 0.4 mg under the tongue every 5 (five) minutes as needed for chest pain.    [provider]    Physical Exam: Vitals:   06/16/17 1922 06/16/17 1923 06/17/17 0030 06/17/17 0118  BP:  (!) 190/83 (!) 154/74 (!) 101/58  Pulse:  67 67 65  Resp:  18 18 13   Temp:  98.4 F (36.9 C) 98.3 F (36.8 C) 98.3 F (36.8 C)  TempSrc:  Oral Oral Oral  SpO2:  97% 96% 95%  Weight: 72.1 kg (159 lb)     Height: 5' (1.524 m)         Constitutional: Moderately built and nourished. Vitals:   06/16/17 1922 06/16/17 1923 06/17/17 0030 06/17/17 0118  BP:  (!) 190/83 (!) 154/74 (!) 101/58  Pulse:  67 67 65  Resp:  18 18 13   Temp:  98.4 F (36.9 C) 98.3 F (36.8 C) 98.3 F (36.8 C)  TempSrc:  Oral Oral Oral  SpO2:  97% 96% 95%  Weight: 72.1 kg (159 lb)     Height: 5' (1.524 m)      Eyes: Anicteric no pallor. ENMT: No discharge from the ears eyes nose and mouth. Neck: No mass felt. No JVD appreciated. Respiratory: No rhonchi or crepitations. Cardiovascular: S1-S2. No murmurs appreciated. Abdomen: Soft nontender bowel sounds present. No guarding or rigidity. Musculoskeletal: No edema. No joint effusion. Skin: No rash or skin appears warm. Neurologic: Patient is alert awake and oriented to name only. Moves all extremities. Psychiatric: Patient has dementia.   Labs on Admission: I have personally reviewed following labs and imaging studies  CBC:  Recent Labs Lab 06/16/17 2140  WBC 8.9  HGB 13.1  HCT 39.4  MCV 81.9  PLT 161   Basic Metabolic Panel:  Recent Labs Lab 06/16/17 2140  NA 135  K 3.6  CL 102    CO2 26  GLUCOSE 99  BUN 13  CREATININE 0.85  CALCIUM 9.2   GFR: Estimated Creatinine Clearance: 49.1 mL/min (by C-G formula based on SCr of 0.85 mg/dL). Liver Function Tests: No results for input(s): AST, ALT, ALKPHOS, BILITOT, PROT, ALBUMIN in the last 168 hours. No results for input(s): LIPASE, AMYLASE in the last 168 hours. No results for input(s): AMMONIA in the last 168 hours. Coagulation Profile: No results for input(s): INR, PROTIME in the last 168 hours. Cardiac Enzymes: No results for input(s): CKTOTAL, CKMB, CKMBINDEX, TROPONINI in the last 168 hours. BNP (last 3 results) No results for input(s): PROBNP in the last 8760 hours. HbA1C: No results for input(s): HGBA1C in the last 72 hours. CBG: No results for input(s): GLUCAP in the last 168 hours. Lipid Profile: No results for input(s): CHOL, HDL, LDLCALC, TRIG, CHOLHDL, LDLDIRECT in the last 72 hours. Thyroid Function Tests: No results for input(s): TSH, T4TOTAL, FREET4, T3FREE, THYROIDAB in the last 72 hours. Anemia Panel: No results for input(s): VITAMINB12, FOLATE, FERRITIN, TIBC, IRON, RETICCTPCT in the last 72 hours. Urine analysis:    Component Value Date/Time   COLORURINE AMBER (A) 06/16/2017 2049   APPEARANCEUR TURBID (A) 06/16/2017 2049   LABSPEC 1.006 06/16/2017 2049   PHURINE 5.0 06/16/2017 2049   GLUCOSEU NEGATIVE 06/16/2017 2049   HGBUR MODERATE (A) 06/16/2017 2049   BILIRUBINUR NEGATIVE 06/16/2017 2049   [place] NEGATIVE 06/16/2017 2049   PROTEINUR 100 (A) 06/16/2017 2049   UROBILINOGEN 0.2 01/02/2015 1313   NITRITE POSITIVE (A) 06/16/2017 2049   LEUKOCYTESUR LARGE (A) 06/16/2017 2049   Sepsis Labs: @LABRCNTIP (procalcitonin:4,lacticidven:4) )No results found for this or any previous visit (from the past 240 hour(s)).   Radiological Exams on Admission: No results found.   Assessment/Plan Principal Problem:   Acute encephalopathy Active Problems:   A-fib ([facility])   Hypothyroidism   Acute  lower UTI    1. Acute encephalopathy likely from UTI -  patient is on ceftriaxone and follow urine cultures. 2. Dementia with behavioral disturbances - on Seroquel Zoloft and mirtazapine.  3.  paroxysmal atrial fibrillation on Coreg and xarelto.  4.  hypothyroidism on Synthroid. 5. History of CAD and CVA and antiplatelet agents Coreg and statins. 6. Asthma on inhalers. 7. On iron supplements.  I have reviewed patient's old charts and labs.   DVT prophylaxis: Xarelto Code Status: DO NOT RESUSCITATE.  Family Communication: No family at the bedside.  Disposition Plan: Back to nursing facility.  Consults called: None.  Admission status: Observation.    Rise Patience MD [facility] Pager [phone].  If 7PM-7AM, please contact night-coverage [email or web address] Password [email or web address]  06/17/2017, 1:20 AM

## 2017-06-17 NOTE — Progress Notes (Signed)
PHARMACIST - PHYSICIAN ORDER COMMUNICATION  CONCERNING: P&T Medication Policy on Herbal Medications  DESCRIPTION:  This patient's order for:  melatonin has been noted.  This product(s) is classified as an "herbal" or natural product. Due to a lack of definitive safety studies or FDA approval, nonstandard manufacturing practices, plus the potential risk of unknown drug-drug interactions while on inpatient medications, the Pharmacy and Therapeutics Committee does not permit the use of "herbal" or natural products of this type within [facility].   ACTION TAKEN: The pharmacy department is unable to verify this order at this time and your patient has been informed of this safety policy. Please reevaluate patient's clinical condition at discharge and address if the herbal or natural product(s) should be resumed at that time.   Dolly Rias RPh 06/17/2017, 2:16 AM Pager [phone]

## 2017-06-17 NOTE — Progress Notes (Signed)
Pharmacy Antibiotic Note  Cindy [name] is a 76 y.o. female admitted on 06/16/2017 with UTI. Past medical history including dementia, CVA, hypertension, atrial fibrillation, CAD who presents with dysuria and altered mental status. Pharmacy has been consulted for ceftriaxone dosing.  Plan: Ceftriaxone 1gm IV q24h Pharmacy will sign off as no adjustment needed in renal dysfunction  Height: 5' (152.4 cm) Weight: 159 lb (72.1 kg) IBW/kg (Calculated) : 45.5  Temp (24hrs), Avg:98.3 F (36.8 C), Min:98.3 F (36.8 C), Max:98.4 F (36.9 C)   Recent Labs Lab 06/16/17 2140  WBC 8.9  CREATININE 0.85    Estimated Creatinine Clearance: 49.1 mL/min (by C-G formula based on SCr of 0.85 mg/dL).    No Known Allergies  Antimicrobials this admission: 7/20 ceftriaxone >>  Microbiology results:  7/19 UCx: sent  Thank you for allowing pharmacy to be a part of this patient's care.  Dolly Rias RPh 06/17/2017, 2:03 AM Pager [phone]

## 2017-06-17 NOTE — Progress Notes (Signed)
Pt said she wasn't going to take the dulera and that she would take the spiriva

## 2017-06-17 NOTE — Care Management Obs Status (Signed)
[place] NOTIFICATION   Patient Details  Name: Cindy [name] MRN: [id number] Date of Birth: May 27, 1941   Medicare Observation Status Notification Given:  Yes, a copy left in pt's room. Pt's daughter was called with no answer.     Purcell Mouton, RN 06/17/2017, 12:23 PM

## 2017-06-17 NOTE — Evaluation (Signed)
Physical Therapy Evaluation Patient Details Name: Cindy [name] MRN: [id number] DOB: Dec 30, 1940 Today's Date: 06/17/2017   History of Present Illness  Cindy [name] is a 76 y.o. female with history of dementia, atrial fibrillation, hypertension, asthma was brought from the nursing home 06/16/17 after patient was found to be increasingly confused from baseline, and agitated. . found to have UTI.  Clinical Impression  The patient is impulsive, attempting out of bed with family member attempting to keep her in the bed. Assisted to [facility] and back to bed.  Pt admitted with above diagnosis. Pt currently with functional limitations due to the deficits listed below (see PT Problem List). Pt will benefit from skilled PT to increase their independence and safety with mobility to allow discharge to the venue listed below.       Follow Up Recommendations Home health PT (return to ALF)    Equipment Recommendations  None recommended by PT    Recommendations for Other Services       Precautions / Restrictions Precautions Precautions: Fall      Mobility  Bed Mobility Overal bed mobility: Needs Assistance Bed Mobility: Supine to Sit;Sit to Supine     Supine to sit: Min assist Sit to supine: Min assist   General bed mobility comments: family in room attempting to  to keep patient from getting up as patient adamant to get to BR. PT Assisted patient to the bed edge  Transfers Overall transfer level: Needs assistance   Transfers: Sit to/from Stand;Stand Pivot Transfers Sit to Stand: Mod assist Stand pivot transfers: Mod assist       General transfer comment: multimodal cues for safety and to transfer to [facility], then assisted back to the bed.  Ambulation/Gait                Stairs            Wheelchair Mobility    Modified Rankin (Stroke Patients Only)       Balance                                             Pertinent Vitals/Pain Pain Assessment:  Faces Faces Pain Scale: Hurts a little bit Pain Location: "down there"-indicated lower abd Pain Descriptors / Indicators: Aching Pain Intervention(s): Monitored during session    Home Living Family/patient expects to be discharged to:: Skilled nursing facility                 Additional Comments: Pt from ALF,     Prior Function Level of Independence: Needs assistance   Gait / Transfers Assistance Needed: daughter reports that the patient does ambulate with a RW, ususally           Hand Dominance        Extremity/Trunk Assessment   Upper Extremity Assessment Upper Extremity Assessment: Generalized weakness    Lower Extremity Assessment Lower Extremity Assessment: Generalized weakness    Cervical / Trunk Assessment Cervical / Trunk Assessment: Kyphotic  Communication   Communication: No difficulties  Cognition Arousal/Alertness: Awake/alert   Overall Cognitive Status: Impaired/Different from baseline Area of Impairment: Orientation;Attention;Following commands;Safety/judgement;Awareness                 Orientation Level: Place;Time;Situation Current Attention Level: Alternating   Following Commands: Follows one step commands inconsistently Safety/Judgement: Decreased awareness of safety     General Comments: daughter  reports that MS is not baseline, easily agitates      General Comments      Exercises     Assessment/Plan    PT Assessment Patient needs continued PT services  PT Problem List Decreased strength;Decreased activity tolerance;Decreased mobility;Decreased balance;Decreased safety awareness;Decreased knowledge of precautions;Decreased knowledge of use of DME;Decreased cognition       PT Treatment Interventions DME instruction;Gait training;Functional mobility training;Therapeutic activities;Patient/family education    PT Goals (Current goals can be found in the Care Plan section)  Acute Rehab PT Goals Patient Stated Goal: to  get to BR PT Goal Formulation: With family Time For Goal Achievement: 07/01/17    Frequency Min 2X/week   Barriers to discharge        Co-evaluation               AM-PAC PT "6 Clicks" Daily Activity  Outcome Measure Difficulty turning over in bed (including adjusting bedclothes, sheets and blankets)?: Total Difficulty moving from lying on back to sitting on the side of the bed? : Total Difficulty sitting down on and standing up from a chair with arms (e.g., wheelchair, bedside commode, etc,.)?: Total Help needed moving to and from a bed to chair (including a wheelchair)?: Total Help needed walking in hospital room?: Total Help needed climbing 3-5 steps with a railing? : Total 6 Click Score: 6    End of Session   Activity Tolerance: Patient tolerated treatment well Patient left: in bed;with call bell/phone within reach;with bed alarm set;with family/visitor present Nurse Communication: Mobility status PT Visit Diagnosis: Difficulty in walking, not elsewhere classified (R26.2)    Time: 1829-9371 PT Time Calculation (min) (ACUTE ONLY): 13 min   Charges:   PT Evaluation $PT Eval Low Complexity: 1 Procedure     PT G Codes:   PT G-Codes **NOT FOR INPATIENT CLASS** Functional Assessment Tool Used: Clinical judgement Functional Limitation: Mobility: Walking and moving around Mobility: Walking and Moving Around Current Status (I9678): At least 60 percent but less than 80 percent impaired, limited or restricted Mobility: Walking and Moving Around Goal Status [phone]): At least 1 percent but less than 20 percent impaired, limited or restricted    [facility] PT [phone]   Claretha Cooper 06/17/2017, 3:27 PM

## 2017-06-17 NOTE — Progress Notes (Signed)
The patient was admitted early this AM after midnight and H and P has been reviewed and I am in agreement with the current Assessment and Plan done by Dr. Hal Hope. Additional changes to the plan of care have been made accordingly. Patient is a 76 year old female with a PMH of Atrial Fibrillation, HTN, Asthma, Dementia, GERD/Barrett's Esophagus, HLD, Hx of CVA, Hx of Uterine Cancer, Anxiety and other comorbids who presented to [facility] for increased confusion and agitation. Patient was worked up and found to have a UTI as Urinalysis showed Many Bacteria, Moderate Hgb, Large Leukocytes, Positive Nitrites, and TNTC WBC. Urine Cx is still currently pending. Patient was placed on IV Ceftriaxone empirically until culture data results. We will monitor patient's clinical response to interventions and repeat blood work in the AM.

## 2017-06-17 NOTE — Progress Notes (Signed)
Spoke with pt's daughter explained the MOON notice. Daughter also stated that pt was from [facility]. CSW to follow.

## 2017-06-18 DIAGNOSIS — F22 Delusional disorders: Secondary | ICD-10-CM

## 2017-06-18 DIAGNOSIS — J45909 Unspecified asthma, uncomplicated: Secondary | ICD-10-CM

## 2017-06-18 DIAGNOSIS — F0281 Dementia in other diseases classified elsewhere with behavioral disturbance: Secondary | ICD-10-CM

## 2017-06-18 DIAGNOSIS — K2270 Barrett's esophagus without dysplasia: Secondary | ICD-10-CM

## 2017-06-18 DIAGNOSIS — F329 Major depressive disorder, single episode, unspecified: Secondary | ICD-10-CM

## 2017-06-18 DIAGNOSIS — F39 Unspecified mood [affective] disorder: Secondary | ICD-10-CM

## 2017-06-18 DIAGNOSIS — G301 Alzheimer's disease with late onset: Secondary | ICD-10-CM

## 2017-06-18 DIAGNOSIS — I6930 Unspecified sequelae of cerebral infarction: Secondary | ICD-10-CM

## 2017-06-18 DIAGNOSIS — E782 Mixed hyperlipidemia: Secondary | ICD-10-CM

## 2017-06-18 DIAGNOSIS — D508 Other iron deficiency anemias: Secondary | ICD-10-CM

## 2017-06-18 DIAGNOSIS — I10 Essential (primary) hypertension: Secondary | ICD-10-CM

## 2017-06-18 LAB — CBC WITH DIFFERENTIAL/PLATELET
Basophils Absolute: 0 10*3/uL (ref 0.0–0.1)
Basophils Relative: 0 %
EOS ABS: 0.3 10*3/uL (ref 0.0–0.7)
Eosinophils Relative: 5 %
HCT: 36 % (ref 36.0–46.0)
HEMOGLOBIN: 11.8 g/dL — AB (ref 12.0–15.0)
LYMPHS ABS: 1.3 10*3/uL (ref 0.7–4.0)
Lymphocytes Relative: 22 %
MCH: 27.8 pg (ref 26.0–34.0)
MCHC: 32.8 g/dL (ref 30.0–36.0)
MCV: 84.7 fL (ref 78.0–100.0)
Monocytes Absolute: 0.5 10*3/uL (ref 0.1–1.0)
Monocytes Relative: 8 %
NEUTROS ABS: 3.6 10*3/uL (ref 1.7–7.7)
NEUTROS PCT: 65 %
Platelets: 153 10*3/uL (ref 150–400)
RBC: 4.25 MIL/uL (ref 3.87–5.11)
RDW: 14.2 % (ref 11.5–15.5)
WBC: 5.6 10*3/uL (ref 4.0–10.5)

## 2017-06-18 LAB — COMPREHENSIVE METABOLIC PANEL
ALBUMIN: 3.1 g/dL — AB (ref 3.5–5.0)
ALT: 10 U/L — AB (ref 14–54)
AST: 14 U/L — AB (ref 15–41)
Alkaline Phosphatase: 52 U/L (ref 38–126)
Anion gap: 9 (ref 5–15)
BILIRUBIN TOTAL: 0.5 mg/dL (ref 0.3–1.2)
BUN: 9 mg/dL (ref 6–20)
CHLORIDE: 107 mmol/L (ref 101–111)
CO2: 24 mmol/L (ref 22–32)
CREATININE: 0.94 mg/dL (ref 0.44–1.00)
Calcium: 8.9 mg/dL (ref 8.9–10.3)
GFR calc Af Amer: >60 mL/min (ref >60)
GFR, EST NON AFRICAN AMERICAN: 57 mL/min — AB (ref >60)
GLUCOSE: 100 mg/dL — AB (ref 65–99)
POTASSIUM: 3.7 mmol/L (ref 3.5–5.1)
Sodium: 140 mmol/L (ref 135–145)
Total Protein: 5.6 g/dL — ABNORMAL LOW (ref 6.5–8.1)

## 2017-06-18 LAB — PHOSPHORUS: Phosphorus: 3.4 mg/dL (ref 2.5–4.6)

## 2017-06-18 LAB — MAGNESIUM: Magnesium: 1.7 mg/dL (ref 1.7–2.4)

## 2017-06-18 MED ORDER — IPRATROPIUM-ALBUTEROL 0.5-2.5 (3) MG/3ML IN SOLN: 3.0000 mL | Freq: Four times a day (QID) | RESPIRATORY_TRACT | Status: DC | PRN

## 2017-06-18 MED ORDER — SODIUM CHLORIDE 0.9 % IV SOLN
INTRAVENOUS | Status: AC
  Administered 2017-06-18: 15:00:00 via INTRAVENOUS

## 2017-06-18 NOTE — Progress Notes (Signed)
PROGRESS NOTE    Cindy [name]  ZOX:[id number] DOB: [birth year] DOA: 06/16/2017 PCP: [name], MD   Brief Narrative: Patient is a 76 year old female with a PMH of Atrial Fibrillation, HTN, Asthma, Dementia, GERD/Barrett's Esophagus, HLD, Hx of CVA, Hx of Uterine Cancer, Anxiety and other comorbids who presented to [facility] for increased confusion and agitation. Patient was worked up and found to have a UTI as Urinalysis showed Many Bacteria, Moderate Hgb, Large Leukocytes, Positive Nitrites, and TNTC WBC. Urine Cx shows >100,000 CFU of GNR. Patient was placed on IV Ceftriaxone empirically until culture data results with sensitivities and then narrow. Patient refused to be examined this AM and was acting belligerent.   Assessment & Plan:   Principal Problem:   Acute encephalopathy Active Problems:   BARRETTS ESOPHAGUS   Asthma   A-fib ([place])   History of CVA with residual deficit   Anemia   GERD (gastroesophageal reflux disease)   Mood disorder (HCC)   Stroke (HCC)   Hyperlipidemia   Dementia with behavioral disturbance   Hypothyroidism   Paranoia (HCC)   Depression   Acute lower UTI   UTI (urinary tract infection)   Essential hypertension  Acute Encephalopathy likely from UTI superimposed on Chronic Dementia -Urinalysis showed Many Bacteria, Moderate Hgb, Large Leukocytes, Positive Nitrites, and TNTC WBC.  -Urine Cx shows >100,000 CFU of GNR. -C/w IV Ceftriaxone Empirically until Cx Sensitivities result -Patient is Afebrile and has no Leukocytosis (WBC went from 8.9 -> 6.7 -> 5.6) -Delirium Precautions -C/w IVF with NS at 50 mL/hr   -Continue to Monitor  -PT/OT Ordered   Dementia with Behavioral Disturbances/Mood Disorder likely 2/2 to CVA after Left CEA -Continue to Reorient -C/w Sertraline 50 mg po qHS, Quetiapine 75 mg po Daily qHS, and Miraztepne 7.5 mg po Daily  Paroxysmal Atrial Fibrillation -C/w Telemetry -C/w with Carvedilol 3.125 mg po BID -C/w Rivaroxaban 15  mg po Daily qHS for Anticoagulation  Hypothyroidism -Check TSH and Free T4 -C/w Levothyroxine 25 mcg po Daily  History of CVA -C/w ASA 81 mg po Daily and with Simvastatin 10 mg po qHS -Anticoagulated with Rivaroxaban 15 mg po qHS for Stroke Prophylaxis -PT/OT Ordered  CAD -C/w ASA 81 mg po Daily and with Simvastatin 10 mg po qHS -C/w Isosorbide Mononitrate 30 mg po Daily and with NTG 0.4 mg sL q52min prn  HLD -Check Lipid Panel in AM -C/w Simvastatin 10 mg po qHS  Hx of Asthma -C/w Dulera 200-5 mcg/act 2 puff BID -C/w Fluticasone 1 spray each nare and Loratadine 10 mg po Daily -Added DuoNeb 3 mL RT q6hprn  GERD/Barrett's Esophagus  -C/w Pantoprazole 40 mg po Daily  Essential Hypertension -C/w Carvedilol 3.125 mg po BID -Added Hydralazine 10 mg IV q4hprn for SBP > 160  Iron Deficiency Anemia -Patient's Hb/Hct went from 12.9/38.1 -> 11.8/36.0 -C/w Iron Supplementation with Ferrous Sulfate 325 mg po Daily -Continue to Monitor for S/Sx of Bleeding -Repeat CBC in AM  DVT prophylaxis: Anticoagulated with Rivaroxaban Code Status: FULL CODE Family Communication:  Disposition Plan: Home Health PT at ALF  Consultants:   None   Procedures: None   Antimicrobials:  Anti-infectives    Start     Dose/Rate Route Frequency Ordered Stop   06/17/17 2200  cefTRIAXone (ROCEPHIN) 1 g in dextrose 5 % 50 mL IVPB     1 g 100 mL/hr over 30 Minutes Intravenous Every 24 hours 06/17/17 0202     06/16/17 2145  cefTRIAXone (ROCEPHIN) 1 g  in dextrose 5 % 50 mL IVPB     1 g 100 mL/hr over 30 Minutes Intravenous  Once 06/16/17 2135 06/16/17 2347     Subjective: Seen at bedside and was angry and did not want to cooperate. Did not want me to examine her. States she just wanted to be left alone. States she slept some last night. No other concerns or complaints at bedside.   Objective: Vitals:   06/17/17 2008 06/18/17 0500 06/18/17 0640 06/18/17 0738  BP: 106/65 (!) 173/80 (!) 156/71     Pulse: 76 71 72   Resp: 18 20    Temp: 98.6 F (37 C) 98.2 F (36.8 C)    TempSrc: Oral Oral    SpO2: 98% 99%  (!) 9%  Weight:      Height:        Intake/Output Summary (Last 24 hours) at 06/18/17 1344 Last data filed at 06/18/17 0300  Gross per 24 hour  Intake              410 ml  Output                0 ml  Net              410 ml   Filed Weights   06/16/17 1922 06/17/17 0220  Weight: 72.1 kg (159 lb) 76.7 kg (169 lb 1.5 oz)   Examination: Physical Exam:   Constitutional: Obese WN/WD Caucasian female in NAD and  Eyes: Lids and conjunctivae normal, sclerae anicteric  ENMT: External Ears, Nose appear normal. Grossly normal hearing.  Neck: Appears normal, supple, no visible cervical masses, normal ROM, no appreciable thyromegaly or visible JVD Respiratory: Normal respiratory effort and patient is not tachypenic. No accessory muscle use. Would not let me auscultate lungs  Cardiovascular: Would not let me auscultate Heart. Abdomen: Would not let me examine or auscultate Abdomen GU: Deferred. Musculoskeletal: No contractures or visible cyanosis Skin: No rashes, lesions, ulcers on extremely limited skin exam as patient did not want me to examine her  Neurologic: CN 2-12 grossly intact with no apparent focal deficits.  Romberg sign cerebellar reflexes not assessed.  Psychiatric: Impaired judgment and insight. Alert and but not oriented x 3. Belligerent mood and inappropriate affect.   Data Reviewed: I have personally reviewed following labs and imaging studies  CBC:  Recent Labs Lab 06/16/17 2140 06/17/17 0441 06/18/17 0346  WBC 8.9 6.7 5.6  NEUTROABS  --   --  3.6  HGB 13.1 12.9 11.8*  HCT 39.4 38.1 36.0  MCV 81.9 83.2 84.7  PLT 160 129* 621   Basic Metabolic Panel:  Recent Labs Lab 06/16/17 2140 06/17/17 0441 06/18/17 0346  NA 135 138 140  K 3.6 3.7 3.7  CL 102 105 107  CO2 26 24 24   GLUCOSE 99 89 100*  BUN 13 13 9   CREATININE 0.85 0.72 0.94  CALCIUM  9.2 9.0 8.9  MG  --   --  1.7  PHOS  --   --  3.4   GFR: Estimated Creatinine Clearance: 45.9 mL/min (by C-G formula based on SCr of 0.94 mg/dL). Liver Function Tests:  Recent Labs Lab 06/18/17 0346  AST 14*  ALT 10*  ALKPHOS 52  BILITOT 0.5  PROT 5.6*  ALBUMIN 3.1*   No results for input(s): LIPASE, AMYLASE in the last 168 hours. No results for input(s): AMMONIA in the last 168 hours. Coagulation Profile: No results for input(s): INR, PROTIME in the last  168 hours. Cardiac Enzymes: No results for input(s): CKTOTAL, CKMB, CKMBINDEX, TROPONINI in the last 168 hours. BNP (last 3 results) No results for input(s): PROBNP in the last 8760 hours. HbA1C: No results for input(s): HGBA1C in the last 72 hours. CBG: No results for input(s): GLUCAP in the last 168 hours. Lipid Profile: No results for input(s): CHOL, HDL, LDLCALC, TRIG, CHOLHDL, LDLDIRECT in the last 72 hours. Thyroid Function Tests: No results for input(s): TSH, T4TOTAL, FREET4, T3FREE, THYROIDAB in the last 72 hours. Anemia Panel: No results for input(s): VITAMINB12, FOLATE, FERRITIN, TIBC, IRON, RETICCTPCT in the last 72 hours. Sepsis Labs: No results for input(s): PROCALCITON, LATICACIDVEN in the last 168 hours.  Recent Results (from the past 240 hour(s))  Urine culture     Status: Abnormal (Preliminary result)   Collection Time: 06/16/17  8:54 PM  Result Value Ref Range Status   Specimen Description URINE, CATHETERIZED  Final   Special Requests NONE  Final   Culture >=100,000 COLONIES/mL ESCHERICHIA COLI (A)  Final   Report Status PENDING  Incomplete  MRSA PCR Screening     Status: None   Collection Time: 06/17/17  2:32 AM  Result Value Ref Range Status   MRSA by PCR NEGATIVE NEGATIVE Final    Comment:        The GeneXpert MRSA Assay (FDA approved for NASAL specimens only), is one component of a comprehensive MRSA colonization surveillance program. It is not intended to diagnose MRSA infection nor to  guide or monitor treatment for MRSA infections.     Radiology Studies: No results found.  Scheduled Meds: . aspirin EC  81 mg Oral Daily  . carvedilol  3.125 mg Oral BID WC  . cholecalciferol  1,000 Units Oral Daily  . dicyclomine  10 mg Oral TID AC & HS  . feeding supplement (ENSURE ENLIVE)  237 mL Oral Daily  . ferrous sulfate  325 mg Oral Q breakfast  . fluticasone  1 spray Each Nare Daily  . isosorbide mononitrate  30 mg Oral Daily  . levothyroxine  25 mcg Oral QAC breakfast  . loratadine  10 mg Oral Daily  . mirtazapine  7.5 mg Oral QHS  . mometasone-formoterol  2 puff Inhalation BID  . pantoprazole  40 mg Oral Daily  . QUEtiapine  75 mg Oral QHS  . Rivaroxaban  15 mg Oral Q supper  . sertraline  50 mg Oral QHS  . simvastatin  10 mg Oral QHS  . tiotropium  18 mcg Inhalation Daily  . traZODone  100 mg Oral QHS   Continuous Infusions: . sodium chloride    . cefTRIAXone (ROCEPHIN)  IV Stopped (06/17/17 2135)    LOS: 1 day   Kerney Elbe, DO [facility] Pager [phone]  If 7PM-7AM, please contact night-coverage [email or web address] Password [email or web address] 06/18/2017, 1:44 PM

## 2017-06-19 DIAGNOSIS — I480 Paroxysmal atrial fibrillation: Secondary | ICD-10-CM

## 2017-06-19 DIAGNOSIS — B9620 Unspecified Escherichia coli [E. coli] as the cause of diseases classified elsewhere: Secondary | ICD-10-CM

## 2017-06-19 LAB — COMPREHENSIVE METABOLIC PANEL
ALK PHOS: 53 U/L (ref 38–126)
ALT: 14 U/L (ref 14–54)
AST: 16 U/L (ref 15–41)
Albumin: 3.1 g/dL — ABNORMAL LOW (ref 3.5–5.0)
Anion gap: 7 (ref 5–15)
BUN: 10 mg/dL (ref 6–20)
CALCIUM: 8.9 mg/dL (ref 8.9–10.3)
CO2: 24 mmol/L (ref 22–32)
CREATININE: 0.94 mg/dL (ref 0.44–1.00)
Chloride: 107 mmol/L (ref 101–111)
GFR, EST NON AFRICAN AMERICAN: 57 mL/min — AB (ref >60)
Glucose, Bld: 102 mg/dL — ABNORMAL HIGH (ref 65–99)
Potassium: 3.5 mmol/L (ref 3.5–5.1)
Sodium: 138 mmol/L (ref 135–145)
TOTAL PROTEIN: 5.7 g/dL — AB (ref 6.5–8.1)
Total Bilirubin: 0.7 mg/dL (ref 0.3–1.2)

## 2017-06-19 LAB — CBC WITH DIFFERENTIAL/PLATELET
Basophils Absolute: 0 10*3/uL (ref 0.0–0.1)
Basophils Relative: 0 %
EOS PCT: 5 %
Eosinophils Absolute: 0.3 10*3/uL (ref 0.0–0.7)
HEMATOCRIT: 37.3 % (ref 36.0–46.0)
HEMOGLOBIN: 12.2 g/dL (ref 12.0–15.0)
LYMPHS ABS: 1.4 10*3/uL (ref 0.7–4.0)
LYMPHS PCT: 22 %
MCH: 27.7 pg (ref 26.0–34.0)
MCHC: 32.7 g/dL (ref 30.0–36.0)
MCV: 84.8 fL (ref 78.0–100.0)
Monocytes Absolute: 0.5 10*3/uL (ref 0.1–1.0)
Monocytes Relative: 8 %
NEUTROS ABS: 4.2 10*3/uL (ref 1.7–7.7)
Neutrophils Relative %: 65 %
PLATELETS: 147 10*3/uL — AB (ref 150–400)
RBC: 4.4 MIL/uL (ref 3.87–5.11)
RDW: 14.2 % (ref 11.5–15.5)
WBC: 6.5 10*3/uL (ref 4.0–10.5)

## 2017-06-19 LAB — MAGNESIUM: MAGNESIUM: 1.6 mg/dL — AB (ref 1.7–2.4)

## 2017-06-19 LAB — LIPID PANEL
Cholesterol: 109 mg/dL (ref 0–200)
HDL: 58 mg/dL (ref >40)
LDL Cholesterol: 37 mg/dL (ref 0–99)
Total CHOL/HDL Ratio: 1.9 RATIO
Triglycerides: 68 mg/dL (ref <150)
VLDL: 14 mg/dL (ref 0–40)

## 2017-06-19 LAB — URINE CULTURE

## 2017-06-19 LAB — PHOSPHORUS: PHOSPHORUS: 3 mg/dL (ref 2.5–4.6)

## 2017-06-19 LAB — T4, FREE: Free T4: 1.33 ng/dL — ABNORMAL HIGH (ref 0.61–1.12)

## 2017-06-19 MED ORDER — CEPHALEXIN 500 MG PO CAPS: 500.0000 mg | ORAL_CAPSULE | Freq: Two times a day (BID) | ORAL | 0 refills | Status: AC

## 2017-06-19 MED ORDER — CEPHALEXIN 500 MG PO CAPS
500.0000 mg | ORAL_CAPSULE | Freq: Two times a day (BID) | ORAL | Status: DC
  Administered 2017-06-19: 500 mg via ORAL
  Filled 2017-06-19: qty 1

## 2017-06-19 MED ORDER — MAGNESIUM SULFATE 2 GM/50ML IV SOLN
2.0000 g | Freq: Once | INTRAVENOUS | Status: AC
  Administered 2017-06-19: 2 g via INTRAVENOUS
  Filled 2017-06-19: qty 50

## 2017-06-19 NOTE — Progress Notes (Signed)
Discharge to: [place] Anticipated discharge date: 06/19/17 Family notified: Yes, at bedside and by phone Transportation by: PTAR  Report #: [id number]  [place] signing off.  Laveda Abbe LCSW [phone]

## 2017-06-19 NOTE — NC FL2 (Signed)
[place] MEDICAID FL2 LEVEL OF CARE SCREENING TOOL     IDENTIFICATION  Patient Name: Cindy [name] Birthdate: [birth year] Sex: female Admission Date (Current Location): 06/16/2017  [facility] and [place] Number:  Herbalist and Address:  [facility],  [place], [place]      Provider Number: [id number]  Attending Physician Name and Address:  Kerney Elbe, DO  Relative Name and Phone Number:       Current Level of Care: Hospital Recommended Level of Care: [place] Prior Approval Number:    Date Approved/Denied:   PASRR Number: [id number]  Discharge Plan: SNF    Current Diagnoses: Patient Active Problem List   Diagnosis Date Noted  . Hypomagnesemia 06/19/2017  . Essential hypertension 06/18/2017  . Acute encephalopathy 06/17/2017  . Acute lower UTI 06/17/2017  . E. coli UTI 06/17/2017  . Depression 04/24/2017  . Hypothyroidism 10/03/2016  . Paranoia ([place]) 10/03/2016  . LOC (loss of consciousness) ([place]) 09/02/2016  . 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 Dementia with behavioral disturbance 09/02/2016  . Asthma with acute exacerbation 08/21/2016  . Atrial fibrillation with RVR ([place]) 08/21/2016  . Elevated INR 08/21/2016  . Acute on chronic systolic CHF (congestive heart failure) ([place]) 08/21/2016  . Hyperlipidemia 08/21/2016  . Electrolyte abnormality 08/21/2016  . HCAP (healthcare-associated pneumonia) 08/10/2016  . Sepsis ([place]) 08/09/2016  . Coronary atherosclerosis of native coronary artery 01/21/2014  . Obesity 01/17/2014  . Abdominal pain, generalized 10/18/2012  . GERD (gastroesophageal reflux disease)   . Mood disorder ([place])   . Anxiety   . 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[place])   . Stroke ([place])   . Chest pain 01/07/2012  . Asthma 01/07/2012  . A-fib ([place]) 01/07/2012  . 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 Anemia 01/07/2012  . ANEMIA  07/29/2010  . BARRETTS ESOPHAGUS 06/04/2010    Orientation RESPIRATION BLADDER Height & Weight     Self  Normal Continent Weight: 169 lb 1.5 oz (76.7 kg) Height:  5' (152.4 cm)  BEHAVIORAL SYMPTOMS/MOOD NEUROLOGICAL BOWEL NUTRITION STATUS      Continent    AMBULATORY STATUS COMMUNICATION OF NEEDS Skin   Limited Assist Verbally Normal                       Personal Care Assistance Level of Assistance  Bathing, Dressing Bathing Assistance: Maximum assistance   Dressing Assistance: Maximum assistance     Functional Limitations Info             SPECIAL CARE FACTORS FREQUENCY  PT (By licensed PT), OT (By licensed OT)     PT Frequency: 5x/wk OT Frequency: 5x/wk            Contractures      Additional Factors Info  Code Status, Allergies, Psychotropic Code Status Info: DNR Allergies Info: NKA Psychotropic Info: Remeron 7.5mg , Seroquel 75mg , Zoloft 50mg          Current Medications (06/19/2017):  This is the current hospital active medication list Current Facility-Administered Medications  Medication Dose Route Frequency Provider Last Rate Last Dose  . acetaminophen (TYLENOL) tablet 650 mg  650 mg Oral Q6H PRN Rise Patience, MD       Or  . acetaminophen (TYLENOL) suppository 650 mg  650 mg Rectal Q6H PRN Rise Patience, MD      . aspirin EC tablet 81 mg  81 mg Oral Daily  Rise Patience, MD   81 mg at 06/19/17 1017  . carvedilol (COREG) tablet 3.125 mg  3.125 mg Oral BID WC Rise Patience, MD   3.125 mg at 06/19/17 1017  . cephALEXin (KEFLEX) capsule 500 mg  500 mg Oral Q12H Sheikh, Georgina Quint Lyden, DO   500 mg at 06/19/17 1018  . cholecalciferol (VITAMIN D) tablet 1,000 Units  1,000 Units Oral Daily Rise Patience, MD   1,000 Units at 06/19/17 1017  . dicyclomine (BENTYL) capsule 10 mg  10 mg Oral TID AC & HS Rise Patience, MD   10 mg at 06/19/17 1018  . feeding supplement (ENSURE ENLIVE) (ENSURE ENLIVE) liquid 237 mL  237 mL  Oral Daily Rise Patience, MD   237 mL at 06/17/17 0835  . ferrous sulfate tablet 325 mg  325 mg Oral Q breakfast Rise Patience, MD   325 mg at 06/19/17 1017  . fluticasone (FLONASE) 50 MCG/ACT nasal spray 1 spray  1 spray Each Nare Daily Rise Patience, MD   1 spray at 06/17/17 [phone]  . guaifenesin (ROBITUSSIN) 100 MG/5ML syrup 200 mg  200 mg Oral Q6H PRN Rise Patience, MD      . hydrALAZINE (APRESOLINE) injection 10 mg  10 mg Intravenous Q4H PRN Rise Patience, MD   10 mg at 06/19/17 0500  . ipratropium-albuterol (DUONEB) 0.5-2.5 (3) MG/3ML nebulizer solution 3 mL  3 mL Nebulization Q6H PRN Sheikh, Omair Latif, DO      . isosorbide mononitrate (IMDUR) 24 hr tablet 30 mg  30 mg Oral Daily Rise Patience, MD   30 mg at 06/19/17 1018  . levothyroxine (SYNTHROID, LEVOTHROID) tablet 25 mcg  25 mcg Oral QAC breakfast Rise Patience, MD   25 mcg at 06/19/17 1018  . loratadine (CLARITIN) tablet 10 mg  10 mg Oral Daily Rise Patience, MD   10 mg at 06/19/17 1018  . magnesium sulfate IVPB 2 g 50 mL  2 g Intravenous Once Sheikh, Omair Latif, DO 50 mL/hr at 06/19/17 1017 2 g at 06/19/17 1017  . mirtazapine (REMERON) tablet 7.5 mg  7.5 mg Oral QHS Rise Patience, MD   7.5 mg at 06/18/17 2229  . mometasone-formoterol (DULERA) 200-5 MCG/ACT inhaler 2 puff  2 puff Inhalation BID Rise Patience, MD   2 puff at 06/19/17 0835  . nitroGLYCERIN (NITROSTAT) SL tablet 0.4 mg  0.4 mg Sublingual Q5 min PRN Rise Patience, MD      . ondansetron [facility] tablet 4 mg  4 mg Oral Q6H PRN Rise Patience, MD       Or  . ondansetron [facility]) injection 4 mg  4 mg Intravenous Q6H PRN Rise Patience, MD      . pantoprazole (PROTONIX) EC tablet 40 mg  40 mg Oral Daily Rise Patience, MD   40 mg at 06/19/17 1018  . polyethylene glycol (MIRALAX / GLYCOLAX) packet 17 g  17 g Oral Daily PRN Rise Patience, MD      . QUEtiapine (SEROQUEL) tablet 75  mg  75 mg Oral QHS Little, Wenda Overland, MD   75 mg at 06/18/17 2228  . Rivaroxaban (XARELTO) tablet 15 mg  15 mg Oral Q supper Rise Patience, MD   15 mg at 06/18/17 1827  . sertraline (ZOLOFT) tablet 50 mg  50 mg Oral QHS Rise Patience, MD   50 mg at 06/18/17 2228  . simvastatin (ZOCOR)  tablet 10 mg  10 mg Oral QHS Rise Patience, MD   10 mg at 06/18/17 2228  . tiotropium (SPIRIVA) inhalation capsule 18 mcg  18 mcg Inhalation Daily Rise Patience, MD   18 mcg at 06/19/17 6606  . traZODone (DESYREL) tablet 100 mg  100 mg Oral QHS Rise Patience, MD   100 mg at 06/18/17 2229     Discharge Medications: Please see discharge summary for a list of discharge medications.  Relevant Imaging Results:  Relevant Lab Results:   Additional Information SS#: [id number]  Geralynn Ochs, LCSW

## 2017-06-19 NOTE — Clinical Social Work Note (Signed)
Clinical Social Work Assessment  Patient Details  Name: Cindy [name] MRN: [id number] Date of Birth: 1940-12-07  Date of referral:  06/19/17               Reason for consult:  Facility Placement, Discharge Planning                Permission sought to share information with:  Facility Sport and exercise psychologist, Family Supports Permission granted to share information::  Yes, Verbal Permission Granted  Name::     Regulatory affairs officer::  [place]  Relationship::  Daughter  Contact Information:     Housing/Transportation Living arrangements for the past 2 months:  [place] of Information:  Adult Children Patient Interpreter Needed:  None Criminal Activity/Legal Involvement Pertinent to Current Situation/Hospitalization:  No - Comment as needed Significant Relationships:  Adult Children Lives with:  Self, Facility Resident Do you feel safe going back to the place where you live?  Yes Need for family participation in patient care:  Yes (Comment) (patient not fully oriented)  Care giving concerns:  Patient has been living at [facility] and family reports no concerns about care received there.   Social Worker assessment / plan:  CSW introduced self to patient's daughter, Cindy [name], over phone and explained role. CSW confirmed with patient's daughter that patient is from [facility], and the plan is to return at discharge. Patient's daughter agreed that patient would return to [facility]. Patient's daughter requested ambulance transport. CSW completed updated FL2 and will confirm with facility that patient can return today, and will follow to facilitate discharge back to facility.  Employment status:  Retired Forensic scientist:  Medicare PT Recommendations:  Home with [place] / Referral to community resources:     Patient/Family's Response to care:  Patient's daughter agreeable to return to SNF.  Patient/Family's Understanding of and Emotional Response to  Diagnosis, Current Treatment, and Prognosis:  Patient's daughter indicated understanding of CSW role in discharge planning.   Emotional Assessment Appearance:  Appears stated age Attitude/Demeanor/Rapport:  Unable to Assess Affect (typically observed):  Unable to Assess Orientation:  Oriented to Self Alcohol / Substance use:  Not Applicable Psych involvement (Current and /or in the community):  No (Comment)  Discharge Needs  Concerns to be addressed:  Care Coordination, Discharge Planning Concerns Readmission within the last 30 days:  No Current discharge risk:  Physical Impairment, Cognitively Impaired Barriers to Discharge:  No Barriers Identified   Geralynn Ochs, LCSW 06/19/2017, 10:49 AM

## 2017-06-19 NOTE — Clinical Social Work Placement (Signed)
   CLINICAL SOCIAL WORK PLACEMENT  NOTE  Date:  06/19/2017  Patient Details  Name: Cindy [name] MRN: [id number] Date of Birth: [birth year]  Clinical Social Work is seeking post-discharge placement for this patient at the [place] level of care (*CSW will initial, date and re-position this form in  chart as items are completed):      Patient/family provided with [facility] Work Department's list of facilities offering this level of care within the geographic area requested by the patient (or if unable, by the patient's family).      Patient/family informed of their freedom to choose among providers that offer the needed level of care, that participate in Medicare, Medicaid or managed care program needed by the patient, have an available bed and are willing to accept the patient.      Patient/family informed of [facility]'s ownership interest in [facility] and [facility], as well as of the fact that they are under no obligation to receive care at these facilities.  PASRR submitted to EDS on       PASRR number received on       Existing PASRR number confirmed on 06/19/17     FL2 transmitted to all facilities in geographic area requested by pt/family on       FL2 transmitted to all facilities within larger geographic area on       Patient informed that his/her managed care company has contracts with or will negotiate with certain facilities, including the following:            Patient/family informed of bed offers received.  Patient chooses bed at [facility]     Physician recommends and patient chooses bed at      Patient to be transferred to [facility] on 06/19/17.  Patient to be transferred to facility by [facility]     Patient family notified on 06/19/17 of transfer.  Name of family member notified:  [name]     PHYSICIAN Please sign FL2     Additional Comment:     _______________________________________________ Geralynn Ochs, LCSW 06/19/2017, 1:03 PM

## 2017-06-19 NOTE — Discharge Summary (Signed)
Physician Discharge Summary  Cindy [name] BBC:[id number] DOB: [birth year] DOA: 06/16/2017  PCP: [name], MD  Admit date: 06/16/2017 Discharge date: 06/19/2017  Admitted From: Nursing Facility Disposition: ALF with Home Health PT  Recommendations for Outpatient Follow-up:  1. Follow up with PCP in 1-2 weeks  2. Please obtain CMP/CBC, Mag, Phos, Lipid Panel, TSH and Free T4 in one week  Home Health: Yes Equipment/Devices: None recommended by PT  Discharge Condition: Stable CODE STATUS: FULL CODE Diet recommendation: Soft Heart Healthy Diet  Brief/Interim Summary: Patient is a 76 year old female with a PMH of Atrial Fibrillation, HTN, Asthma, Dementia, GERD/Barrett's Esophagus, HLD, Hx of CVA, Hx of Uterine Cancer, Anxiety and other comorbids who presented to [facility] for increased confusion and agitation. Patient was worked up and found to have a UTI as Urinalysis showed Many Bacteria, Moderate Hgb, Large Leukocytes, Positive Nitrites, and TNTC WBC. Urine Cx shows >100,000 CFU of E Coli. Patient was placed on IV Ceftriaxone empirically until culture data results with sensitivities and then narrowed to po Cephalexin 500 mg po BID x 4 days for 7 day course. Patient was less confused and actually pleasant this AM. She was deemed medically stable and will need to follow up with PCP as an outpatient for Hospital Follow Up within 1 week.   Discharge Diagnoses:  Principal Problem:   Acute encephalopathy Active Problems:   BARRETTS ESOPHAGUS   Asthma   A-fib ([place])   History of CVA with residual deficit   Anemia   GERD (gastroesophageal reflux disease)   Mood disorder (HCC)   Stroke (HCC)   Hyperlipidemia   Dementia with behavioral disturbance   Hypothyroidism   Paranoia (HCC)   Depression   Acute lower UTI   E. coli UTI   Essential hypertension   Hypomagnesemia  Acute Encephalopathy likely from E. Coli UTI superimposed on Chronic Dementia, improved -Urinalysis showed Many Bacteria,  Moderate Hgb, Large Leukocytes, Positive Nitrites, and TNTC WBC.  -Urine Cx shows >100,000 CFU of E.Coli. -Was on IV Ceftriaxone Empirically until Cx Sensitivities resulted; Patient was sensitive to Ceftriaxone so was narrowed to Keflex 500 mg q12h for 4 more days to complete a 7 day course  -Patient is Afebrile and has no Leukocytosis (WBC went from 8.9 -> 6.7 -> 5.6 -> 6.5) -Delirium Precautions -Was IVF with NS at 50 mL/hr   -PT/OT Ordered and recommending Home Health PT -Follow up with PCP as an outpatient   Acute E Coli UTI, poA -As Above -No Blood Cx were Obtained on Admission  -Follow up Hospitalization with PCP within 1 week   Dementia with Behavioral Disturbances/Mood Disorder likely 2/2 to CVA after Left CEA -Continue to Reorient -C/w Sertraline 50 mg po qHS, Quetiapine 75 mg po Daily qHS, and Miraztepne 7.5 mg po Daily -Follow up with PCP as an outpatient   Paroxysmal Atrial Fibrillation -Was on Telemetry and in A Fib this AM -C/w with Carvedilol 3.125 mg po BID -C/w Rivaroxaban 15 mg po Daily qHS for Anticoagulation  Hypothyroidism -Check TSH and Free T4 as an outpatient; Last TSH was 4.09 in May 2018 -C/w Levothyroxine 25 mcg po Daily  History of CVA -C/w ASA 81 mg po Daily and with Simvastatin 10 mg po qHS -Anticoagulated with Rivaroxaban 15 mg po qHS for Stroke Prophylaxis -PT/OT Ordered  CAD -C/w ASA 81 mg po Daily and with Simvastatin 10 mg po qHS -C/w Isosorbide Mononitrate 30 mg po Daily and with NTG 0.4 mg sL q74min prn  HLD -Check Lipid Panel as an outpatient  -C/w Simvastatin 10 mg po qHS  Hx of Asthma -C/w Dulera 200-5 mcg/act 2 puff BID -C/w Fluticasone 1 spray each nare and Loratadine 10 mg po Daily -Added DuoNeb 3 mL RT q6hprn while hospitalized but can continue DuoNeb q4hprn -Follow up with PCP as an outpatient   GERD/Barrett's Esophagus  -C/w Pantoprazole 40 mg po Daily  Essential Hypertension -C/w Carvedilol 3.125 mg po  BID -Added Hydralazine 10 mg IV q4hprn for SBP > 160  Iron Deficiency Anemia -Patient's Hb/Hct went from 12.9/38.1 -> 11.8/36.0 -> 12.2/37.3 -C/w Iron Supplementation with Ferrous Sulfate 325 mg po Daily -Continue to Monitor for S/Sx of Bleeding -Repeat CBC in AM  Hypomagnesemia  -Patient's Mag Level was 1.6 -Replete with IV Mag Sulfate 2 grams prior to D/C -Continue to Monitor and repeat Mag as an outpatient   Discharge Instructions  Discharge Instructions    Call MD for:  difficulty breathing, headache or visual disturbances    Complete by:  As directed    Call MD for:  extreme fatigue    Complete by:  As directed    Call MD for:  persistant dizziness or light-headedness    Complete by:  As directed    Call MD for:  persistant nausea and vomiting    Complete by:  As directed    Call MD for:  redness, tenderness, or signs of infection (pain, swelling, redness, odor or green/yellow discharge around incision site)    Complete by:  As directed    Call MD for:  severe uncontrolled pain    Complete by:  As directed    Call MD for:  temperature >100.4    Complete by:  As directed    Diet - low sodium heart healthy    Complete by:  As directed    Soft Diet   Discharge instructions    Complete by:  As directed    Follow up with PCP at Discharge for Hospital Follow up for UTI. Take all medications as prescribed. If symptoms change or worsen please return to the ED for evaluation.   Increase activity slowly    Complete by:  As directed      Allergies as of 06/19/2017   No Known Allergies     Medication List    STOP taking these medications   Melatonin 3 MG Tabs     TAKE these medications   acetaminophen 325 MG tablet Commonly known as:  TYLENOL Take 2 tablets (650 mg total) by mouth every 6 (six) hours as needed for mild pain (or Fever >/= 101).   albuterol 108 (90 Base) MCG/ACT inhaler Commonly known as:  PROVENTIL HFA;VENTOLIN HFA Inhale 2 puffs into the lungs every  6 (six) hours as needed for shortness of breath.   alendronate 70 MG tablet Commonly known as:  FOSAMAX Take 70 mg by mouth every Monday. Take with a full glass of water on an empty stomach.   aspirin EC 81 MG tablet Take 81 mg by mouth daily.   budesonide-formoterol 160-4.5 MCG/ACT inhaler Commonly known as:  SYMBICORT Inhale 2 puffs into the lungs 2 (two) times daily.   carvedilol 3.125 MG tablet Commonly known as:  COREG Take 1 tablet (3.125 mg total) by mouth 2 (two) times daily with a meal.   cephALEXin 500 MG capsule Commonly known as:  KEFLEX Take 1 capsule (500 mg total) by mouth every 12 (twelve) hours.   dicyclomine 10 MG capsule Commonly  known as:  BENTYL Take 10 mg by mouth 4 (four) times daily -  before meals and at bedtime.   ENSURE Take 237 mLs by mouth daily.   ferrous sulfate 325 (65 FE) MG tablet Take 325 mg by mouth daily with breakfast.   fluticasone 50 MCG/ACT nasal spray Commonly known as:  FLONASE Place 1 spray into both nostrils daily.   guaifenesin 100 MG/5ML syrup Commonly known as:  ROBITUSSIN Take 200 mg by mouth every 6 (six) hours as needed for cough.   ipratropium-albuterol 0.5-2.5 (3) MG/3ML Soln Commonly known as:  DUONEB Take 3 mLs by nebulization every 4 (four) hours as needed.   isosorbide mononitrate 30 MG 24 hr tablet Commonly known as:  IMDUR Take 30 mg by mouth daily.   levothyroxine 25 MCG tablet Commonly known as:  SYNTHROID, LEVOTHROID Take 25 mcg by mouth daily before breakfast.   loratadine 10 MG tablet Commonly known as:  CLARITIN Take 10 mg by mouth daily.   mirtazapine 15 MG tablet Commonly known as:  REMERON Take 7.5 mg by mouth at bedtime. 1/2 TAB   nitroGLYCERIN 0.4 MG SL tablet Commonly known as:  NITROSTAT Place 0.4 mg under the tongue every 5 (five) minutes as needed for chest pain.   omeprazole 40 MG capsule Commonly known as:  PRILOSEC Take 40 mg by mouth daily.   polyethylene glycol  packet Commonly known as:  MIRALAX / GLYCOLAX Take 17 g by mouth daily as needed.   QUEtiapine 50 MG tablet Commonly known as:  SEROQUEL Take 75 mg by mouth at bedtime.   Rivaroxaban 15 MG Tabs tablet Commonly known as:  XARELTO Take 15 mg by mouth daily.   sertraline 50 MG tablet Commonly known as:  ZOLOFT Take 50 mg by mouth at bedtime.   simvastatin 10 MG tablet Commonly known as:  ZOCOR Take 10 mg by mouth at bedtime.   sodium chloride 0.65 % nasal spray Commonly known as:  OCEAN Place 1 spray into the nose every 4 (four) hours as needed for congestion. Into both nares   tiotropium 18 MCG inhalation capsule Commonly known as:  SPIRIVA Place 18 mcg into inhaler and inhale daily.   traZODone 150 MG tablet Commonly known as:  DESYREL Take 100 mg by mouth at bedtime.   Vitamin D3 1000 units Caps Take 1 capsule by mouth daily.       No Known Allergies  Consultations:  None  Procedures/Studies: No results found.  Subjective: Seen and examined and was sitting bedside. Had no complaints or concerns except that she needed her "nerve pill." Denied any other complaints or concerns and was doing well and ready to go back to ALF.  Discharge Exam: Vitals:   06/19/17 0631 06/19/17 0835  BP: (!) 173/89   Pulse: 66 77  Resp:  18  Temp:     Vitals:   06/18/17 2000 06/19/17 0455 06/19/17 0631 06/19/17 0835  BP: (!) 182/79 (!) 192/90 (!) 173/89   Pulse: 64 67 66 77  Resp: 20 18  18   Temp: 97.7 F (36.5 C) 98.5 F (36.9 C)    TempSrc: Oral Oral    SpO2: 99% 97%  97%  Weight:      Height:       General: Pt is alert, awake, not in acute distress Cardiovascular: Irregularly Irregular, S1/S2 +, no rubs, no gallops Respiratory: CTA bilaterally, no wheezing, no rhonchi; Patient is not tachypenic or using any accessory muscles to breathe Abdominal: Soft,  NT, ND, bowel sounds + Extremities: Mild non pitting edema, no cyanosis  The results of significant diagnostics  from this hospitalization (including imaging, microbiology, ancillary and laboratory) are listed below for reference.    Microbiology: Recent Results (from the past 240 hour(s))  Urine culture     Status: Abnormal   Collection Time: 06/16/17  8:54 PM  Result Value Ref Range Status   Specimen Description URINE, CATHETERIZED  Final   Special Requests NONE  Final   Culture >=100,000 COLONIES/mL ESCHERICHIA COLI (A)  Final   Report Status 06/19/2017 FINAL  Final   Organism ID, Bacteria ESCHERICHIA COLI (A)  Final      Susceptibility   Escherichia coli - MIC*    AMPICILLIN >=32 RESISTANT Resistant     CEFAZOLIN <=4 SENSITIVE Sensitive     CEFTRIAXONE <=1 SENSITIVE Sensitive     CIPROFLOXACIN 2 INTERMEDIATE Intermediate     GENTAMICIN >=16 RESISTANT Resistant     IMIPENEM <=0.25 SENSITIVE Sensitive     NITROFURANTOIN <=16 SENSITIVE Sensitive     TRIMETH/SULFA <=20 SENSITIVE Sensitive     AMPICILLIN/SULBACTAM 16 INTERMEDIATE Intermediate     PIP/TAZO <=4 SENSITIVE Sensitive     Extended ESBL NEGATIVE Sensitive     * >=100,000 COLONIES/mL ESCHERICHIA COLI  MRSA PCR Screening     Status: None   Collection Time: 06/17/17  2:32 AM  Result Value Ref Range Status   MRSA by PCR NEGATIVE NEGATIVE Final    Comment:        The GeneXpert MRSA Assay (FDA approved for NASAL specimens only), is one component of a comprehensive MRSA colonization surveillance program. It is not intended to diagnose MRSA infection nor to guide or monitor treatment for MRSA infections.     Labs: BNP (last 3 results)  Recent Labs  08/09/16 2029  BNP 630.1*   Basic Metabolic Panel:  Recent Labs Lab 06/16/17 2140 06/17/17 0441 06/18/17 0346 06/19/17 0431  NA 135 138 140 138  K 3.6 3.7 3.7 3.5  CL 102 105 107 107  CO2 26 24 24 24   GLUCOSE 99 89 100* 102*  BUN 13 13 9 10   CREATININE 0.85 0.72 0.94 0.94  CALCIUM 9.2 9.0 8.9 8.9  MG  --   --  1.7 1.6*  PHOS  --   --  3.4 3.0   Liver Function  Tests:  Recent Labs Lab 06/18/17 0346 06/19/17 0431  AST 14* 16  ALT 10* 14  ALKPHOS 52 53  BILITOT 0.5 0.7  PROT 5.6* 5.7*  ALBUMIN 3.1* 3.1*   No results for input(s): LIPASE, AMYLASE in the last 168 hours. No results for input(s): AMMONIA in the last 168 hours. CBC:  Recent Labs Lab 06/16/17 2140 06/17/17 0441 06/18/17 0346 06/19/17 0431  WBC 8.9 6.7 5.6 6.5  NEUTROABS  --   --  3.6 4.2  HGB 13.1 12.9 11.8* 12.2  HCT 39.4 38.1 36.0 37.3  MCV 81.9 83.2 84.7 84.8  PLT 160 129* 153 147*   Cardiac Enzymes: No results for input(s): CKTOTAL, CKMB, CKMBINDEX, TROPONINI in the last 168 hours. BNP: Invalid input(s): POCBNP CBG: No results for input(s): GLUCAP in the last 168 hours. D-Dimer No results for input(s): DDIMER in the last 72 hours. Hgb A1c No results for input(s): HGBA1C in the last 72 hours. Lipid Profile No results for input(s): CHOL, HDL, LDLCALC, TRIG, CHOLHDL, LDLDIRECT in the last 72 hours. Thyroid function studies No results for input(s): TSH, T4TOTAL, T3FREE, THYROIDAB in the  last 72 hours.  Invalid input(s): FREET3 Anemia work up No results for input(s): VITAMINB12, FOLATE, FERRITIN, TIBC, IRON, RETICCTPCT in the last 72 hours. Urinalysis    Component Value Date/Time   COLORURINE AMBER (A) 06/16/2017 2049   APPEARANCEUR TURBID (A) 06/16/2017 2049   LABSPEC 1.006 06/16/2017 2049   PHURINE 5.0 06/16/2017 2049   GLUCOSEU NEGATIVE 06/16/2017 2049   HGBUR MODERATE (A) 06/16/2017 2049   BILIRUBINUR NEGATIVE 06/16/2017 2049   KETONESUR NEGATIVE 06/16/2017 2049   PROTEINUR 100 (A) 06/16/2017 2049   UROBILINOGEN 0.2 01/02/2015 1313   NITRITE POSITIVE (A) 06/16/2017 2049   LEUKOCYTESUR LARGE (A) 06/16/2017 2049   Sepsis Labs Invalid input(s): PROCALCITONIN,  WBC,  LACTICIDVEN Microbiology Recent Results (from the past 240 hour(s))  Urine culture     Status: Abnormal   Collection Time: 06/16/17  8:54 PM  Result Value Ref Range Status    Specimen Description URINE, CATHETERIZED  Final   Special Requests NONE  Final   Culture >=100,000 COLONIES/mL ESCHERICHIA COLI (A)  Final   Report Status 06/19/2017 FINAL  Final   Organism ID, Bacteria ESCHERICHIA COLI (A)  Final      Susceptibility   Escherichia coli - MIC*    AMPICILLIN >=32 RESISTANT Resistant     CEFAZOLIN <=4 SENSITIVE Sensitive     CEFTRIAXONE <=1 SENSITIVE Sensitive     CIPROFLOXACIN 2 INTERMEDIATE Intermediate     GENTAMICIN >=16 RESISTANT Resistant     IMIPENEM <=0.25 SENSITIVE Sensitive     NITROFURANTOIN <=16 SENSITIVE Sensitive     TRIMETH/SULFA <=20 SENSITIVE Sensitive     AMPICILLIN/SULBACTAM 16 INTERMEDIATE Intermediate     PIP/TAZO <=4 SENSITIVE Sensitive     Extended ESBL NEGATIVE Sensitive     * >=100,000 COLONIES/mL ESCHERICHIA COLI  MRSA PCR Screening     Status: None   Collection Time: 06/17/17  2:32 AM  Result Value Ref Range Status   MRSA by PCR NEGATIVE NEGATIVE Final    Comment:        The GeneXpert MRSA Assay (FDA approved for NASAL specimens only), is one component of a comprehensive MRSA colonization surveillance program. It is not intended to diagnose MRSA infection nor to guide or monitor treatment for MRSA infections.    Time coordinating discharge: 35 minutes  SIGNED:  Kerney Elbe, DO [facility] 06/19/2017, 9:23 AM Pager [phone]  If 7PM-7AM, please contact night-coverage [email or web address] Password [email or web address]

## 2017-06-19 NOTE — Care Management Note (Signed)
Case Management Note  Patient Details  Name: Cindy [name] MRN: [id number] Date of Birth: 11/22/1941  Subjective/Objective:      Acute encephalopathy, afib, asthma              Action/Plan: Discharge Planning:  Chart reviewed. CSW following for dc back to SNF today.   PCP [name]  Expected Discharge Date:  06/19/17               Expected Discharge Plan:  Skilled Nursing Facility  In-House Referral:  Clinical Social Work  Discharge planning Services  CM Consult  Post Acute Care Choice:  NA Choice offered to:  NA  DME Arranged:  N/A DME Agency:  NA  HH Arranged:  NA HH Agency:  NA  Status of Service:  Completed, signed off  If discussed at [employer] of Stay Meetings, dates discussed:    Additional Comments:  Erenest Rasher, RN 06/19/2017, 12:58 PM

## 2017-06-20 ENCOUNTER — Non-Acute Institutional Stay (SKILLED_NURSING_FACILITY): Payer: Medicare Other | Admitting: Internal Medicine

## 2017-06-20 ENCOUNTER — Encounter: Payer: Self-pay | Admitting: Internal Medicine

## 2017-06-20 DIAGNOSIS — F39 Unspecified mood [affective] disorder: Secondary | ICD-10-CM

## 2017-06-20 DIAGNOSIS — G301 Alzheimer's disease with late onset: Secondary | ICD-10-CM

## 2017-06-20 DIAGNOSIS — F22 Delusional disorders: Secondary | ICD-10-CM | POA: Diagnosis not present

## 2017-06-20 DIAGNOSIS — G9340 Encephalopathy, unspecified: Secondary | ICD-10-CM

## 2017-06-20 DIAGNOSIS — K219 Gastro-esophageal reflux disease without esophagitis: Secondary | ICD-10-CM | POA: Diagnosis not present

## 2017-06-20 DIAGNOSIS — E034 Atrophy of thyroid (acquired): Secondary | ICD-10-CM

## 2017-06-20 DIAGNOSIS — K2270 Barrett's esophagus without dysplasia: Secondary | ICD-10-CM | POA: Diagnosis not present

## 2017-06-20 DIAGNOSIS — I10 Essential (primary) hypertension: Secondary | ICD-10-CM | POA: Diagnosis not present

## 2017-06-20 DIAGNOSIS — B9620 Unspecified Escherichia coli [E. coli] as the cause of diseases classified elsewhere: Secondary | ICD-10-CM

## 2017-06-20 DIAGNOSIS — F309 Manic episode, unspecified: Secondary | ICD-10-CM | POA: Diagnosis not present

## 2017-06-20 DIAGNOSIS — I480 Paroxysmal atrial fibrillation: Secondary | ICD-10-CM | POA: Diagnosis not present

## 2017-06-20 DIAGNOSIS — N390 Urinary tract infection, site not specified: Secondary | ICD-10-CM | POA: Diagnosis not present

## 2017-06-20 DIAGNOSIS — F0281 Dementia in other diseases classified elsewhere with behavioral disturbance: Secondary | ICD-10-CM

## 2017-06-20 DIAGNOSIS — F02818 Dementia in other diseases classified elsewhere, unspecified severity, with other behavioral disturbance: Secondary | ICD-10-CM

## 2017-06-20 DIAGNOSIS — E782 Mixed hyperlipidemia: Secondary | ICD-10-CM | POA: Diagnosis not present

## 2017-06-20 NOTE — Progress Notes (Signed)
: Provider:  Noah Delaine. [name], MD Location:  [place] Room Number: [id number] Place of Service:  SNF ([phone])  PCP: [name], MD Patient Care Team: [name], MD as PCP - General [facility])  Extended Emergency Contact Information Primary Emergency Contact: Tyner,[name] Address: [address] [place], [place] [address] [name] of [place] Phone: [phone] Relation: Daughter Secondary Emergency Contact: Drue [place] of [place] Phone: [phone] Relation: Niece     Allergies: Patient has no known allergies.  Chief Complaint  Patient presents with  . Readmit To SNF    following hospitalization 06/16/17 to 06/19/17 acute encephalopathy.    HPI: Patient is 76 y.o. female with atrial fibrillation, hypertension, asthma, dementia, GERD/Barrett's esophagus, hyperlipidemia, history of CVA, and anxiety who was sent from skilled nursing facility for paranoia and manic behavior. Patient had been diagnosed with a UTI with Escherichia coli and had been placed on Omnicef 300 mg by mouth twice a day. However patient continued to be up all night, was throwing off her close out of her drawers and so patient was sent to the ED because we could not get in touch with the psychiatric nurse. Immediately the patient was felt to be confused and agitated and was found to have a UTI by them which was the same as the UTI that had been discovered and treated at the skilled nursing facility. Patient was placed on IV Rocephin empirically and then given Keflex for a full 7 day course. On the morning of discharge patient appeared pleasant and was sent back to the skilled nursing facility for residential care. While at skilled nursing facility patient will be followed for hyperlipidemia treated with Zocor, GERD treated with Prilosec and hypothyroidism treated with Synthroid.  Past Medical History:  Diagnosis Date  . Angina pectoris ([place])     . Anxiety   . Asthma   . Barrett's esophagus   . Bronchitis   . 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 Chest pain   . Coronary artery disease    right carotid occlusion   . Depression   . Dysrhythmia    atrial fibrillation  . GERD (gastroesophageal reflux disease)   . Hyperlipidemia   . Hypertension   . Recurrent upper respiratory infection (URI)   . Stroke [facility] [place])    2007, after left endarterectomy    Past Surgical History:  Procedure Laterality Date  . ABDOMINAL HYSTERECTOMY    . BALLOON DILATION  10/18/2012   Procedure: BALLOON DILATION;  Surgeon: Lear Ng, MD;  Location: [facility] ENDOSCOPY;  Service: Endoscopy;  Laterality: N/A;  . CARDIAC CATHETERIZATION    . CAROTID ENDARTERECTOMY     left, complicated by stroke  . CHOLECYSTECTOMY     incidental at time of colectomy  . COLONOSCOPY  12/01/2011   Procedure: COLONOSCOPY;  Surgeon: Lear Ng, MD;  Location: [facility] ENDOSCOPY;  Service: Endoscopy;  Laterality: N/A;  . COLONOSCOPY  10/18/2012   Procedure: COLONOSCOPY;  Surgeon: Lear Ng, MD;  Location: [facility] ENDOSCOPY;  Service: Endoscopy;  Laterality: N/A;  colonic dilation  . ESOPHAGOGASTRODUODENOSCOPY  12/01/2011   Procedure: ESOPHAGOGASTRODUODENOSCOPY (EGD);  Surgeon: Lear Ng, MD;  Location: [facility];  Service: Endoscopy;  Laterality: N/A;  . HOT HEMOSTASIS  12/01/2011   Procedure: HOT HEMOSTASIS (ARGON PLASMA COAGULATION/BICAP);  Surgeon: Lear Ng, MD;  Location: [facility];  Service: Endoscopy;  Laterality: N/A;  .  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 Always use your most recent med list.          acetaminophen 325 MG tablet Commonly known as:  TYLENOL Take 2 tablets (650 mg total) by mouth every 6 (six) hours as needed for mild pain (or Fever >/= 101).   albuterol 108 (90  Base) MCG/ACT inhaler Commonly known as:  PROVENTIL HFA;VENTOLIN HFA Inhale 2 puffs into the lungs every 6 (six) hours as needed for shortness of breath.   alendronate 70 MG tablet Commonly known as:  FOSAMAX Take 70 mg by mouth every Monday. Take with a full glass of water on an empty stomach.   aspirin EC 81 MG tablet Take 81 mg by mouth daily.   budesonide-formoterol 160-4.5 MCG/ACT inhaler Commonly known as:  SYMBICORT Inhale 2 puffs into the lungs 2 (two) times daily.   carvedilol 3.125 MG tablet Commonly known as:  COREG Take 1 tablet (3.125 mg total) by mouth 2 (two) times daily with a meal.   cephALEXin 500 MG capsule Commonly known as:  KEFLEX Take 1 capsule (500 mg total) by mouth every 12 (twelve) hours.   dicyclomine 10 MG capsule Commonly known as:  BENTYL Take 10 mg by mouth 4 (four) times daily -  before meals and at bedtime.   ENSURE Take 237 mLs by mouth daily.   ferrous sulfate 325 (65 FE) MG tablet Take 325 mg by mouth daily with breakfast.   fluticasone 50 MCG/ACT nasal spray Commonly known as:  FLONASE Place 1 spray into both nostrils daily.   guaifenesin 100 MG/5ML syrup Commonly known as:  ROBITUSSIN Take 200 mg by mouth every 6 (six) hours as needed for cough.   ipratropium-albuterol 0.5-2.5 (3) MG/3ML Soln Commonly known as:  DUONEB Take 3 mLs by nebulization every 4 (four) hours as needed.   isosorbide mononitrate 30 MG 24 hr tablet Commonly known as:  IMDUR Take 30 mg by mouth daily.   levothyroxine 25 MCG tablet Commonly known as:  SYNTHROID, LEVOTHROID Take 25 mcg by mouth daily before breakfast.   loratadine 10 MG tablet Commonly known as:  CLARITIN Take 10 mg by mouth daily.   mirtazapine 15 MG tablet Commonly known as:  REMERON Take 7.5 mg by mouth at bedtime. 1/2 TAB   nitroGLYCERIN 0.4 MG SL tablet Commonly known as:  NITROSTAT Place 0.4 mg under the tongue every 5 (five) minutes as needed for chest pain.   omeprazole  40 MG capsule Commonly known as:  PRILOSEC Take 40 mg by mouth daily.   polyethylene glycol packet Commonly known as:  MIRALAX / GLYCOLAX Take 17 g by mouth daily as needed.   QUEtiapine 50 MG tablet Commonly known as:  SEROQUEL Take 75 mg by mouth at bedtime.   Rivaroxaban 15 MG Tabs tablet Commonly known as:  XARELTO Take 15 mg by mouth daily.   sertraline 50 MG tablet Commonly known as:  ZOLOFT Take 50 mg by mouth at bedtime.   simvastatin 10 MG tablet Commonly known as:  ZOCOR Take 10 mg by mouth at bedtime.   sodium chloride 0.65 % nasal spray Commonly known as:  OCEAN Place 1 spray into the nose every 4 (four) hours as needed for congestion. Into both nares  tiotropium 18 MCG inhalation capsule Commonly known as:  SPIRIVA Place 18 mcg into inhaler and inhale daily.   traZODone 150 MG tablet Commonly known as:  DESYREL Take 100 mg by mouth at bedtime.   Vitamin D3 1000 units Caps Take 1 capsule by mouth daily.       No orders of the defined types were placed in this encounter.   Immunization History  Administered Date(s) Administered  . Influenza-Unspecified 08/26/2016  . PPD Test 08/16/2016  . Pneumococcal Polysaccharide-23 01/08/2012    Social History  Substance Use Topics  . Smoking status: Former Smoker    Quit date: 04/09/2003  . Smokeless tobacco: Never Used  . Alcohol use No    Family history is   Family History  Problem Relation Age of Onset  . Heart disease Mother   . Cancer Mother   . Heart attack Father   . Cancer Sister        ovarian      Review of Systems  DATA OBTAINED: from patient; nurse-patient is still confused and with behaviors  GENERAL:  no fevers, fatigue, appetite changes SKIN: No itching, or rash EYES: No eye pain, redness, discharge EARS: No earache, tinnitus, change in hearing NOSE: No congestion, drainage or bleeding  MOUTH/THROAT: No mouth or tooth pain, No sore throat RESPIRATORY: No cough, wheezing,  SOB CARDIAC: No chest pain, palpitations, lower extremity edema  GI: No abdominal pain, No N/V/D or constipation, No heartburn or reflux  GU: No dysuria, frequency or urgency, or incontinence  MUSCULOSKELETAL: No unrelieved bone/joint pain NEUROLOGIC: No headache, dizziness or focal weakness PSYCHIATRIC: No c/o anxiety or sadness   Vitals:   06/20/17 0929  BP: 140/74  Pulse: 78  Resp: 20  Temp: 98.4 F (36.9 C)    SpO2 Readings from Last 1 Encounters:  06/20/17 98%   Body mass index is 33.01 kg/m.     Physical Exam  GENERAL APPEARANCE: Alert, conversant,  No acute distress.  SKIN: No diaphoresis rash HEAD: Normocephalic, atraumatic  EYES: Conjunctiva/lids clear. Pupils round, reactive. EOMs intact.  EARS: External exam WNL, canals clear. Hearing grossly normal.  NOSE: No deformity or discharge.  MOUTH/THROAT: Lips w/o lesions  RESPIRATORY: Breathing is even, unlabored. Lung sounds are clear   CARDIOVASCULAR: Heart RRR no murmurs, rubs or gallops. No peripheral edema.   GASTROINTESTINAL: Abdomen is soft, non-tender, not distended w/ normal bowel sounds. GENITOURINARY: Bladder non tender, not distended  MUSCULOSKELETAL: No abnormal joints or musculature NEUROLOGIC:  Cranial nerves 2-12 grossly intact. Moves all extremities  PSYCHIATRIC: Confused and hyperactive, no behavioral issues  Patient Active Problem List   Diagnosis Date Noted  . Hypomagnesemia 06/19/2017  . Essential hypertension 06/18/2017  . Acute encephalopathy 06/17/2017  . Acute lower UTI 06/17/2017  . E. coli UTI 06/17/2017  . Depression 04/24/2017  . Hypothyroidism 10/03/2016  . Paranoia ([place]) 10/03/2016  . LOC (loss of consciousness) ([place]) 09/02/2016  . 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 Dementia with behavioral disturbance 09/02/2016  . Asthma with acute exacerbation 08/21/2016  . Atrial fibrillation with RVR ([place]) 08/21/2016  . Elevated INR 08/21/2016  . Acute on chronic systolic  CHF (congestive heart failure) ([place]) 08/21/2016  . Hyperlipidemia 08/21/2016  . Electrolyte abnormality 08/21/2016  . HCAP (healthcare-associated pneumonia) 08/10/2016  . Sepsis ([place]) 08/09/2016  . Coronary atherosclerosis of native coronary artery 01/21/2014  . Obesity 01/17/2014  . Abdominal pain, generalized 10/18/2012  . GERD (gastroesophageal reflux disease)   . Mood disorder ([place])   .  Anxiety   . 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[place])   . Stroke ([place])   . Chest pain 01/07/2012  . Asthma 01/07/2012  . A-fib ([place]) 01/07/2012  . 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 Anemia 01/07/2012  . ANEMIA 07/29/2010  . BARRETTS ESOPHAGUS 06/04/2010      Labs reviewed: Basic Metabolic Panel:    Component Value Date/Time   NA 138 06/19/2017 0431   NA 138 04/08/2017   K 3.5 06/19/2017 0431   CL 107 06/19/2017 0431   CO2 24 06/19/2017 0431   GLUCOSE 102 (H) 06/19/2017 0431   BUN 10 06/19/2017 0431   BUN 41 (A) 04/08/2017   CREATININE 0.94 06/19/2017 0431   CALCIUM 8.9 06/19/2017 0431   PROT 5.7 (L) 06/19/2017 0431   ALBUMIN 3.1 (L) 06/19/2017 0431   AST 16 06/19/2017 0431   ALT 14 06/19/2017 0431   ALKPHOS 53 06/19/2017 0431   BILITOT 0.7 06/19/2017 0431   GFRNONAA 57 (L) 06/19/2017 0431   GFRAA >60 06/19/2017 0431     Recent Labs  06/17/17 0441 06/18/17 0346 06/19/17 0431  NA 138 140 138  K 3.7 3.7 3.5  CL 105 107 107  CO2 24 24 24   GLUCOSE 89 100* 102*  BUN 13 9 10   CREATININE 0.72 0.94 0.94  CALCIUM 9.0 8.9 8.9  MG  --  1.7 1.6*  PHOS  --  3.4 3.0   Liver Function Tests:  Recent Labs  08/12/16 0527  09/29/16 06/18/17 0346 06/19/17 0431  AST 110*  < > 21 14* 16  ALT 127*  < > 19 10* 14  ALKPHOS 88  < > 61 52 53  BILITOT 1.0  --   --  0.5 0.7  PROT 5.9*  --   --  5.6* 5.7*  ALBUMIN 2.7*  --   --  3.1* 3.1*  < > = values in this interval not displayed. No results for input(s): LIPASE, AMYLASE in the last 8760  hours. No results for input(s): AMMONIA in the last 8760 hours. CBC:  Recent Labs  08/12/16 0527  06/17/17 0441 06/18/17 0346 06/19/17 0431  WBC 23.0*  < > 6.7 5.6 6.5  NEUTROABS 20.5*  --   --  3.6 4.2  HGB 9.5*  < > 12.9 11.8* 12.2  HCT 29.1*  < > 38.1 36.0 37.3  MCV 82.0  < > 83.2 84.7 84.8  PLT 337  < > 129* 153 147*  < > = values in this interval not displayed. Lipid  Recent Labs  09/29/16 06/19/17 0433  CHOL 172 109  HDL 73* 58  LDLCALC 73 37  TRIG 128 68    Cardiac Enzymes:  Recent Labs  08/10/16 0111 08/10/16 0613 08/10/16 1153  TROPONINI 0.07* 0.08* 0.08*   BNP:  Recent Labs  08/09/16 2029  BNP 339.5*   No results found for: [facility] Lab Results  Component Value Date   HGBA1C 5.4 04/08/2017   Lab Results  Component Value Date   TSH 4.09 04/08/2017   Lab Results  Component Value Date   VITAMINB12 1,820 09/29/2016   No results found for: FOLATE No results found for: IRON, TIBC, FERRITIN  Imaging and Procedures obtained prior to SNF admission: No results found.   Not all labs, radiology exams or other studies done during hospitalization come through on my EPIC note; however they are reviewed by me.    Assessment and Plan  ACUTE encephalopathy/ACUTE MANIA/E COLI  UTI-patient was treated with Rocephin for 3 days and Keflex for 4 days to continue to complete a 7 day course SNF - admitted to skilled nursing facility for residential care; will continue Keflex 500 mg every 12 hours for 4 more days; patient's behaviors are still present; fortunately the psychiatric nurses in the facility today and will see the patient  DEMENTIA WITH BEHAVIORS/MOOD DISORDER SNF - patient is currently on Zoloft 50 mg daily at bedtime and Seroquel 75 mg daily at bedtime; Seroquel had had been increased from 50 mg to 75 mg one patient's paranoia and manic behavior first began; the psychiatric nurses in the building today and will evaluate patient and provide  further recommendations  PAROXYSMAL ATRIAL FIB-was noted to be in atrial fib in the hospital SNF - plan to continue with Coreg 3.125 mg by mouth twice a day for rate, and Xarelto 50 mg by mouth daily as prophylaxis  HYPERLIPIDEMIA SNF - stable; plan to continue Zocor 10 mg by mouth daily  GERD/BARRETT'S ESOPHAGUS SNF -stable; plan to continue omeprazole 40 mg by mouth daily  Hypothyroidism SNF - stable plan to continue Synthroid 25 g by mouth daily    time spent greater than 40 minutes ;> 50% of time with patient was spent reviewing records, labs, tests and studies, counseling and developing plan of care  Webb Silversmith D. [name], MD

## 2017-06-21 ENCOUNTER — Encounter: Payer: Self-pay | Admitting: Internal Medicine

## 2017-06-21 DIAGNOSIS — F309 Manic episode, unspecified: Secondary | ICD-10-CM | POA: Insufficient documentation

## 2017-07-07 ENCOUNTER — Non-Acute Institutional Stay (SKILLED_NURSING_FACILITY): Payer: Medicare Other

## 2017-07-07 DIAGNOSIS — Z0000 Encounter for general adult medical examination without abnormal findings: Secondary | ICD-10-CM | POA: Diagnosis not present

## 2017-07-07 NOTE — Patient Instructions (Signed)
Cindy [name] , Thank you for taking time to come for your Medicare Wellness Visit. I appreciate your ongoing commitment to your health goals. Please review the following plan we discussed and let me know if I can assist you in the future.   Screening recommendations/referrals: Colonoscopy excluded, pt over age 78 Mammogram excluded, pt over age 65 Bone Density due Recommended yearly ophthalmology/optometry visit for glaucoma screening and checkup Recommended yearly dental visit for hygiene and checkup  Vaccinations: Influenza vaccine due 2018 fall season Pneumococcal vaccine 13 due Tdap vaccine due Shingles vaccine not in records   Advanced directives: DNR in chart, copies of health care power of attorney and living will are needed   Conditions/risks identified: None  Next appointment: Dr. [name] makes rounds   Preventive Care 21 Years and Older, Female Preventive care refers to lifestyle choices and visits with your health care provider that can promote health and wellness. What does preventive care include?  A yearly physical exam. This is also called an annual well check.  Dental exams once or twice a year.  Routine eye exams. Ask your health care provider how often you should have your eyes checked.  Personal lifestyle choices, including:  Daily care of your teeth and gums.  Regular physical activity.  Eating a healthy diet.  Avoiding tobacco and drug use.  Limiting alcohol use.  Practicing safe sex.  Taking low-dose aspirin every day.  Taking vitamin and mineral supplements as recommended by your health care provider. What happens during an annual well check? The services and screenings done by your health care provider during your annual well check will depend on your age, overall health, lifestyle risk factors, and family history of disease. Counseling  Your health care provider may ask you questions about your:  Alcohol use.  Tobacco use.  Drug  use.  Emotional well-being.  Home and relationship well-being.  Sexual activity.  Eating habits.  History of falls.  Memory and ability to understand (cognition).  Work and work Statistician.  Reproductive health. Screening  You may have the following tests or measurements:  Height, weight, and BMI.  Blood pressure.  Lipid and cholesterol levels. These may be checked every 5 years, or more frequently if you are over 63 years old.  Skin check.  Lung cancer screening. You may have this screening every year starting at age 71 if you have a 30-pack-year history of smoking and currently smoke or have quit within the past 15 years.  Fecal occult blood test (FOBT) of the stool. You may have this test every year starting at age 50.  Flexible sigmoidoscopy or colonoscopy. You may have a sigmoidoscopy every 5 years or a colonoscopy every 10 years starting at age 26.  Hepatitis C blood test.  Hepatitis B blood test.  Sexually transmitted disease (STD) testing.  Diabetes screening. This is done by checking your blood sugar (glucose) after you have not eaten for a while (fasting). You may have this done every 1-3 years.  Bone density scan. This is done to screen for osteoporosis. You may have this done starting at age 76.  Mammogram. This may be done every 1-2 years. Talk to your health care provider about how often you should have regular mammograms. Talk with your health care provider about your test results, treatment options, and if necessary, the need for more tests. Vaccines  Your health care provider may recommend certain vaccines, such as:  Influenza vaccine. This is recommended every year.  Tetanus, diphtheria, and  acellular pertussis (Tdap, Td) vaccine. You may need a Td booster every 10 years.  Zoster vaccine. You may need this after age 58.  Pneumococcal 13-valent conjugate (PCV13) vaccine. One dose is recommended after age 59.  Pneumococcal polysaccharide  (PPSV23) vaccine. One dose is recommended after age 13. Talk to your health care provider about which screenings and vaccines you need and how often you need them. This information is not intended to replace advice given to you by your health care provider. Make sure you discuss any questions you have with your health care provider. Document Released: 12/12/2015 Document Revised: 08/04/2016 Document Reviewed: 09/16/2015 Elsevier Interactive Patient Education  2017 [place] Prevention in the Home Falls can cause injuries. They can happen to people of all ages. There are many things you can do to make your home safe and to help prevent falls. What can I do on the outside of my home?  Regularly fix the edges of walkways and driveways and fix any cracks.  Remove anything that might make you trip as you walk through a door, such as a raised step or threshold.  Trim any bushes or trees on the path to your home.  Use bright outdoor lighting.  Clear any walking paths of anything that might make someone trip, such as rocks or tools.  Regularly check to see if handrails are loose or broken. Make sure that both sides of any steps have handrails.  Any raised decks and porches should have guardrails on the edges.  Have any leaves, snow, or ice cleared regularly.  Use sand or salt on walking paths during winter.  Clean up any spills in your garage right away. This includes oil or grease spills. What can I do in the bathroom?  Use night lights.  Install grab bars by the toilet and in the tub and shower. Do not use towel bars as grab bars.  Use non-skid mats or decals in the tub or shower.  If you need to sit down in the shower, use a plastic, non-slip stool.  Keep the floor dry. Clean up any water that spills on the floor as soon as it happens.  Remove soap buildup in the tub or shower regularly.  Attach bath mats securely with double-sided non-slip rug tape.  Do not have  throw rugs and other things on the floor that can make you trip. What can I do in the bedroom?  Use night lights.  Make sure that you have a light by your bed that is easy to reach.  Do not use any sheets or blankets that are too big for your bed. They should not hang down onto the floor.  Have a firm chair that has side arms. You can use this for support while you get dressed.  Do not have throw rugs and other things on the floor that can make you trip. What can I do in the kitchen?  Clean up any spills right away.  Avoid walking on wet floors.  Keep items that you use a lot in easy-to-reach places.  If you need to reach something above you, use a strong step stool that has a grab bar.  Keep electrical cords out of the way.  Do not use floor polish or wax that makes floors slippery. If you must use wax, use non-skid floor wax.  Do not have throw rugs and other things on the floor that can make you trip. What can I do with my stairs?  Do not leave any items on the stairs.  Make sure that there are handrails on both sides of the stairs and use them. Fix handrails that are broken or loose. Make sure that handrails are as long as the stairways.  Check any carpeting to make sure that it is firmly attached to the stairs. Fix any carpet that is loose or worn.  Avoid having throw rugs at the top or bottom of the stairs. If you do have throw rugs, attach them to the floor with carpet tape.  Make sure that you have a light switch at the top of the stairs and the bottom of the stairs. If you do not have them, ask someone to add them for you. What else can I do to help prevent falls?  Wear shoes that:  Do not have high heels.  Have rubber bottoms.  Are comfortable and fit you well.  Are closed at the toe. Do not wear sandals.  If you use a stepladder:  Make sure that it is fully opened. Do not climb a closed stepladder.  Make sure that both sides of the stepladder are  locked into place.  Ask someone to hold it for you, if possible.  Clearly mark and make sure that you can see:  Any grab bars or handrails.  First and last steps.  Where the edge of each step is.  Use tools that help you move around (mobility aids) if they are needed. These include:  Canes.  Walkers.  Scooters.  Crutches.  Turn on the lights when you go into a dark area. Replace any light bulbs as soon as they burn out.  Set up your furniture so you have a clear path. Avoid moving your furniture around.  If any of your floors are uneven, fix them.  If there are any pets around you, be aware of where they are.  Review your medicines with your doctor. Some medicines can make you feel dizzy. This can increase your chance of falling. Ask your doctor what other things that you can do to help prevent falls. This information is not intended to replace advice given to you by your health care provider. Make sure you discuss any questions you have with your health care provider. Document Released: 09/11/2009 Document Revised: 04/22/2016 Document Reviewed: 12/20/2014 Elsevier Interactive Patient Education  2017 [employer].

## 2017-07-07 NOTE — Progress Notes (Signed)
Subjective:   Cindy [name] is a 76 y.o. female who presents for Medicare Annual (Subsequent) preventive examination at [facility]  Last AWV-03/29/16  Objective:     Vitals: BP (!) 110/58 (BP Location: Right Arm, Patient Position: Sitting)   Pulse 74   Temp 97.7 F (36.5 C) (Oral)   Ht 5' (1.524 m)   Wt 169 lb (76.7 kg)   SpO2 97%   BMI 33.01 kg/m   Body mass index is 33.01 kg/m.   Tobacco History  Smoking Status  . Former Smoker  . Quit date: 04/09/2003  Smokeless Tobacco  . Never Used     Counseling given: Not Answered   Past Medical History:  Diagnosis Date  . Angina pectoris ([place])   . Anxiety   . Asthma   . Barrett's esophagus   . Bronchitis   . 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 Chest pain   . Coronary artery disease    right carotid occlusion   . Depression   . Dysrhythmia    atrial fibrillation  . GERD (gastroesophageal reflux disease)   . Hyperlipidemia   . Hypertension   . Recurrent upper respiratory infection (URI)   . Stroke [facility])    2007, after left endarterectomy   Past Surgical History:  Procedure Laterality Date  . ABDOMINAL HYSTERECTOMY    . BALLOON DILATION  10/18/2012   Procedure: BALLOON DILATION;  Surgeon: Lear Ng, MD;  Location: [facility];  Service: Endoscopy;  Laterality: N/A;  . CARDIAC CATHETERIZATION    . CAROTID ENDARTERECTOMY     left, complicated by stroke  . CHOLECYSTECTOMY     incidental at time of colectomy  . COLONOSCOPY  12/01/2011   Procedure: COLONOSCOPY;  Surgeon: Lear Ng, MD;  [facility];  Service: Endoscopy;  Laterality: N/A;  . COLONOSCOPY  10/18/2012   Procedure: COLONOSCOPY;  Surgeon: Lear Ng, MD;  [facility];  Service: Endoscopy;  Laterality: N/A;  colonic dilation  . ESOPHAGOGASTRODUODENOSCOPY  12/01/2011   Procedure: ESOPHAGOGASTRODUODENOSCOPY (EGD);  Surgeon: Lear Ng, MD;  Location: [facility];  Service: Endoscopy;   Laterality: N/A;  . HOT HEMOSTASIS  12/01/2011   Procedure: HOT HEMOSTASIS (ARGON PLASMA COAGULATION/BICAP);  Surgeon: Lear Ng, MD;  Location: [facility];  Service: Endoscopy;  Laterality: N/A;  . 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 Heart disease Mother   . Cancer Mother   . Heart attack Father   . Cancer Sister        ovarian   History  Sexual Activity  . Sexual activity: No    Outpatient Encounter Prescriptions as of 07/07/2017  Medication Sig  . acetaminophen (TYLENOL) 325 MG tablet Take 2 tablets (650 mg total) by mouth every 6 (six) hours as needed for mild pain (or Fever >/= 101).  Marland Kitchen albuterol (PROVENTIL HFA;VENTOLIN HFA) 108 (90 BASE) MCG/ACT inhaler Inhale 2 puffs into the lungs every 6 (six) hours as needed for shortness of breath.   Marland Kitchen alendronate (FOSAMAX) 70 MG tablet Take 70 mg by mouth every Monday. Take with a full glass of water on an empty stomach.  Marland Kitchen aspirin EC 81 MG tablet Take 81 mg by mouth daily.  . budesonide-formoterol (SYMBICORT) 160-4.5 MCG/ACT inhaler Inhale 2 puffs into the lungs 2 (two) times daily.  . busPIRone (BUSPAR) 10 MG tablet  Take 10 mg by mouth 3 (three) times daily.  . carvedilol (COREG) 3.125 MG tablet Take 1 tablet (3.125 mg total) by mouth 2 (two) times daily with a meal.  . Cholecalciferol (VITAMIN D3) 1000 units CAPS Take 1 capsule by mouth daily.  Marland Kitchen dicyclomine (BENTYL) 10 MG capsule Take 10 mg by mouth 4 (four) times daily -  before meals and at bedtime.  . ENSURE (ENSURE) Take 237 mLs by mouth daily.   . ferrous sulfate 325 (65 FE) MG tablet Take 325 mg by mouth daily with breakfast.   . fluticasone (FLONASE) 50 MCG/ACT nasal spray Place 1 spray into both nostrils daily.   Marland Kitchen guaifenesin (ROBITUSSIN) 100 MG/5ML syrup Take 200 mg by mouth every 6 (six) hours as needed for cough.  Marland Kitchen ipratropium-albuterol (DUONEB) 0.5-2.5 (3) MG/3ML  SOLN Take 3 mLs by nebulization every 4 (four) hours as needed.   . isosorbide mononitrate (IMDUR) 30 MG 24 hr tablet Take 30 mg by mouth daily.  Marland Kitchen levothyroxine (SYNTHROID, LEVOTHROID) 25 MCG tablet Take 25 mcg by mouth daily before breakfast.   . loratadine (CLARITIN) 10 MG tablet Take 10 mg by mouth daily.  . mirtazapine (REMERON) 15 MG tablet Take 7.5 mg by mouth at bedtime. 1/2 TAB  . nitroGLYCERIN (NITROSTAT) 0.4 MG SL tablet Place 0.4 mg under the tongue every 5 (five) minutes as needed for chest pain.  . NON FORMULARY Take 120 mLs by mouth 2 (two) times daily. With medications  . omeprazole (PRILOSEC) 40 MG capsule Take 40 mg by mouth daily.   . polyethylene glycol (MIRALAX / GLYCOLAX) packet Take 17 g by mouth daily as needed.   Marland Kitchen QUEtiapine (SEROQUEL) 100 MG tablet Take 100 mg by mouth daily.  . QUEtiapine (SEROQUEL) 50 MG tablet Take 50 mg by mouth at bedtime.  . Rivaroxaban (XARELTO) 15 MG TABS tablet Take 15 mg by mouth daily.  . sertraline (ZOLOFT) 50 MG tablet Take 50 mg by mouth at bedtime.  . simvastatin (ZOCOR) 10 MG tablet Take 10 mg by mouth at bedtime.  . sodium chloride (OCEAN) 0.65 % nasal spray Place 1 spray into the nose every 4 (four) hours as needed for congestion. Into both nares  . tiotropium (SPIRIVA) 18 MCG inhalation capsule Place 18 mcg into inhaler and inhale daily.  . traZODone (DESYREL) 150 MG tablet Take 100 mg by mouth at bedtime.   . TRAZODONE HCL ER PO Take 175 mg by mouth at bedtime.  . [DISCONTINUED] QUEtiapine (SEROQUEL) 50 MG tablet Take 75 mg by mouth at bedtime.    No facility-administered encounter medications on file as of 07/07/2017.     Activities of Daily Living In your present state of health, do you have any difficulty performing the following activities: 07/07/2017 06/17/2017  Hearing? Y N  Vision? N N  Difficulty concentrating or making decisions? Tempie Donning  Walking or climbing stairs? Y Y  Dressing or bathing? Y Y  Doing errands, shopping? Tempie Donning  Preparing Food and eating ? Y -  Using the Toilet? Y -  In the past six months, have you accidently leaked urine? Y -  Do you have problems with loss of bowel control? Y -  Managing your Medications? Y -  Managing your Finances? Y -  Housekeeping or managing your Housekeeping? Y -  Some recent data might be hidden    Patient Care Team: [name], MD as PCP - General (Family Medicine)    Assessment:  Exercise Activities and Dietary recommendations Current Exercise Habits: The patient does not participate in regular exercise at present, Exercise limited by: orthopedic condition(s)  Goals    None     Fall Risk Fall Risk  07/07/2017  Falls in the past year? No   Depression Screen PHQ 2/9 Scores 07/07/2017  PHQ - 2 Score 4  PHQ- 9 Score 7     Cognitive Function     6CIT Screen 07/07/2017  What Year? 0 points  What month? 0 points  What time? 3 points  Count back from 20 0 points  Months in reverse 4 points  Repeat phrase 10 points  Total Score 17    Immunization History  Administered Date(s) Administered  . Influenza-Unspecified 08/26/2016  . PPD Test 08/16/2016  . Pneumococcal Polysaccharide-23 01/08/2012   Screening Tests Health Maintenance  Topic Date Due  . PNA vac Low Risk Adult (2 of 2 - PCV13) 01/07/2013  . INFLUENZA VACCINE  06/29/2017  . DEXA SCAN  11/29/2017 (Originally 08/15/2004)  . TETANUS/TDAP  11/29/2018 (Originally 08/15/1960)      Plan:    I have personally reviewed and addressed the Medicare Annual Wellness questionnaire and have noted the following in the patient's chart:  A. Medical and social history B. Use of alcohol, tobacco or illicit drugs  C. Current medications and supplements D. Functional ability and status E.  Nutritional status F.  Physical activity G. Advance directives H. List of other physicians I.  Hospitalizations, surgeries, and ER visits in previous 12 months J.  [place] to include hearing,  vision, cognitive, depression L. Referrals and appointments - none  In addition, I have reviewed and discussed with patient certain preventive protocols, quality metrics, and best practice recommendations. A written personalized care plan for preventive services as well as general preventive health recommendations were provided to patient.  See attached scanned questionnaire for additional information.   Signed,   Rich Reining, RN Nurse Health Advisor   Quick Notes   Health Maintenance: DEXA, PNA 13 and TDAP due     Abnormal Screen: PHQ-9:7, 6 Cit-17     Patient Concerns: None     Nurse Concerns: None

## 2017-07-22 ENCOUNTER — Non-Acute Institutional Stay (SKILLED_NURSING_FACILITY): Payer: Medicare Other | Admitting: Internal Medicine

## 2017-07-22 ENCOUNTER — Encounter: Payer: Self-pay | Admitting: Internal Medicine

## 2017-07-22 DIAGNOSIS — I639 Cerebral infarction, unspecified: Secondary | ICD-10-CM

## 2017-07-22 DIAGNOSIS — I5022 Chronic systolic (congestive) heart failure: Secondary | ICD-10-CM | POA: Diagnosis not present

## 2017-07-22 DIAGNOSIS — I25119 Atherosclerotic heart disease of native coronary artery with unspecified angina pectoris: Secondary | ICD-10-CM

## 2017-07-22 DIAGNOSIS — I209 Angina pectoris, unspecified: Secondary | ICD-10-CM | POA: Diagnosis not present

## 2017-07-22 DIAGNOSIS — I110 Hypertensive heart disease with heart failure: Secondary | ICD-10-CM

## 2017-07-22 NOTE — Progress Notes (Signed)
Location:  [facility] Room Number: [phone] Place of Service:  SNF [phone])  Cindy Poll, MD  Patient Care Team: Cindy Poll, MD as PCP - General [facility])  Extended Emergency Contact Information Primary Emergency Contact: Tyner,[name] Address: [address] [place], [place] [address] [name] of [place] Phone: [phone] Relation: Daughter Secondary Emergency Contact: Drue [place] of [place] Phone: [phone] Relation: Niece    Allergies: Patient has no known allergies.  Chief Complaint  Patient presents with  . Medical Management of Chronic Issues    routine visit    HPI: Patient is 76 y.o. female who Is being seen for routine issues of coronary artery disease, hypertension, and status post stroke.  Past Medical History:  Diagnosis Date  . A-fib ([place]) 01/07/2012  . ANEMIA 07/29/2010   Qualifier: Diagnosis of  By: Bobby Rumpf CMA (AAMA), [place]    . Angina pectoris ([place])   . Anxiety   . Asthma   . Asthma 01/07/2012  . Barrett's esophagus   . Bronchitis   . 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 Chest pain   . Coronary artery disease    right carotid occlusion   . Dementia with behavioral disturbance 09/02/2016  . Depression   . Dysrhythmia    atrial fibrillation  . GERD (gastroesophageal reflux disease)   . 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 Hyperlipidemia   . Hypertension   . Mood disorder ([place])   . Paranoia ([place]) 10/03/2016  . Recurrent upper respiratory infection (URI)   . Stroke [facility])    2007, after left endarterectomy    Past Surgical History:  Procedure Laterality Date  . ABDOMINAL HYSTERECTOMY    . BALLOON DILATION  10/18/2012   Procedure: BALLOON DILATION;  Surgeon: Lear Ng, MD;  [facility];  Service: Endoscopy;  Laterality: N/A;  . CARDIAC CATHETERIZATION    . CAROTID ENDARTERECTOMY     left, complicated by stroke  . CHOLECYSTECTOMY     incidental  at time of colectomy  . COLONOSCOPY  12/01/2011   Procedure: COLONOSCOPY;  Surgeon: Lear Ng, MD;  [facility];  Service: Endoscopy;  Laterality: N/A;  . COLONOSCOPY  10/18/2012   Procedure: COLONOSCOPY;  Surgeon: Lear Ng, MD;  [facility];  Service: Endoscopy;  Laterality: N/A;  colonic dilation  . ESOPHAGOGASTRODUODENOSCOPY  12/01/2011   Procedure: ESOPHAGOGASTRODUODENOSCOPY (EGD);  Surgeon: Lear Ng, MD;  Location: [facility];  Service: Endoscopy;  Laterality: N/A;  . HOT HEMOSTASIS  12/01/2011   Procedure: HOT HEMOSTASIS (ARGON PLASMA COAGULATION/BICAP);  Surgeon: Lear Ng, MD;  Location: [facility];  Service: Endoscopy;  Laterality: N/A;  . 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 Always use your most recent med list.          acetaminophen 325 MG tablet Commonly known as:  TYLENOL Take 2 tablets (650 mg total) by mouth every 6 (six) hours as needed for mild pain (or Fever >/= 101).   albuterol 108 (90 Base) MCG/ACT inhaler Commonly known as:  PROVENTIL HFA;VENTOLIN HFA Inhale 2 puffs into the lungs every 6 (six) hours as needed for shortness of breath.  alendronate 70 MG tablet Commonly known as:  FOSAMAX Take 70 mg by mouth every Monday. Take with a full glass of water on an empty stomach.   aspirin EC 81 MG tablet Take 81 mg by mouth daily.   budesonide-formoterol 160-4.5 MCG/ACT inhaler Commonly known as:  SYMBICORT Inhale 2 puffs into the lungs 2 (two) times daily.   busPIRone 10 MG tablet Commonly known as:  BUSPAR Take 10 mg by mouth 3 (three) times daily.   carvedilol 3.125 MG tablet Commonly known as:  COREG Take 1 tablet (3.125 mg total) by mouth 2 (two) times daily with a meal.   dicyclomine 10 MG capsule Commonly known as:   BENTYL Take 10 mg by mouth 4 (four) times daily -  before meals and at bedtime.   ENSURE Take 237 mLs by mouth daily.   ferrous sulfate 325 (65 FE) MG tablet Take 325 mg by mouth daily with breakfast.   fluticasone 50 MCG/ACT nasal spray Commonly known as:  FLONASE Place 1 spray into both nostrils daily.   guaifenesin 100 MG/5ML syrup Commonly known as:  ROBITUSSIN Take 200 mg by mouth every 6 (six) hours as needed for cough.   ipratropium-albuterol 0.5-2.5 (3) MG/3ML Soln Commonly known as:  DUONEB Take 3 mLs by nebulization every 4 (four) hours as needed.   isosorbide mononitrate 30 MG 24 hr tablet Commonly known as:  IMDUR Take 30 mg by mouth daily.   levothyroxine 25 MCG tablet Commonly known as:  SYNTHROID, LEVOTHROID Take 25 mcg by mouth daily before breakfast.   loratadine 10 MG tablet Commonly known as:  CLARITIN Take 10 mg by mouth daily.   mirtazapine 15 MG tablet Commonly known as:  REMERON Take 7.5 mg by mouth at bedtime. 1/2 TAB   nitroGLYCERIN 0.4 MG SL tablet Commonly known as:  NITROSTAT Place 0.4 mg under the tongue every 5 (five) minutes as needed for chest pain.   NON FORMULARY Take 120 mLs by mouth. Med pass 2.0 -  Take 120 ml twice a day with meals   omeprazole 40 MG capsule Commonly known as:  PRILOSEC Take 40 mg by mouth daily.   polyethylene glycol packet Commonly known as:  MIRALAX / GLYCOLAX Take 17 g by mouth daily as needed.   QUEtiapine 100 MG tablet Commonly known as:  SEROQUEL Take 100 mg by mouth daily.   QUEtiapine 50 MG tablet Commonly known as:  SEROQUEL Take 50 mg by mouth at bedtime.   Rivaroxaban 15 MG Tabs tablet Commonly known as:  XARELTO Take 15 mg by mouth daily.   sertraline 50 MG tablet Commonly known as:  ZOLOFT Take 50 mg by mouth at bedtime.   simvastatin 10 MG tablet Commonly known as:  ZOCOR Take 10 mg by mouth at bedtime.   sodium chloride 0.65 % nasal spray Commonly known as:  OCEAN Place 1  spray into the nose every 4 (four) hours as needed for congestion. Into both nares   tiotropium 18 MCG inhalation capsule Commonly known as:  SPIRIVA Place 18 mcg into inhaler and inhale daily.   traZODone 150 MG tablet Commonly known as:  DESYREL Take 100 mg by mouth at bedtime.   TRAZODONE HCL ER PO Take 175 mg by mouth at bedtime.   Vitamin D3 1000 units Caps Take 1 capsule by mouth daily.       No orders of the defined types were placed in this encounter.   Immunization History  Administered Date(s) Administered  .  Influenza-Unspecified 08/26/2016  . PPD Test 08/16/2016  . Pneumococcal Polysaccharide-23 01/08/2012    Social History  Substance Use Topics  . Smoking status: Former Smoker    Quit date: 04/09/2003  . Smokeless tobacco: Never Used  . Alcohol use No    Review of Systems  DATA OBTAINED: from patient, nurse GENERAL:  no fevers, fatigue, appetite changes SKIN: No itching, rash HEENT: No complaint RESPIRATORY: No cough, wheezing, SOB CARDIAC: No chest pain, palpitations, lower extremity edema  GI: No abdominal pain, No N/V/D or constipation, No heartburn or reflux  GU: No dysuria, frequency or urgency, or incontinence  MUSCULOSKELETAL: No unrelieved bone/joint pain NEUROLOGIC: No headache, dizziness  PSYCHIATRIC: No overt anxiety or sadness  Vitals:   07/22/17 1124  BP: (!) 184/85  Pulse: 78  Resp: 20  Temp: 98.7 F (37.1 C)   Body mass index is 30.08 kg/m. Physical Exam  GENERAL APPEARANCE: Alert, conversant, No acute distress; Patient was initially very confused when she came back from the hospital, acting differently than when she was admitted to the hospital but she strongly calmed down and is closer to baseline  SKIN: No diaphoresis rash HEENT: Unremarkable RESPIRATORY: Breathing is even, unlabored. Lung sounds are clear   CARDIOVASCULAR: Heart RRR no murmurs, rubs or gallops. No peripheral edema  GASTROINTESTINAL: Abdomen is soft,  non-tender, not distended w/ normal bowel sounds.  GENITOURINARY: Bladder non tender, not distended  MUSCULOSKELETAL: No abnormal joints or musculature NEUROLOGIC: Cranial nerves 2-12 grossly intact. Moves all extremities PSYCHIATRIC: Mood and affect appropriate to situation with dementia, no behavioral issues  Patient Active Problem List   Diagnosis Date Noted  . Mania ([place]) 06/21/2017  . Hypomagnesemia 06/19/2017  . Essential hypertension 06/18/2017  . Acute encephalopathy 06/17/2017  . Acute lower UTI 06/17/2017  . E. coli UTI 06/17/2017  . Depression 04/24/2017  . Hypothyroidism 10/03/2016  . Paranoia ([place]) 10/03/2016  . LOC (loss of consciousness) ([place]) 09/02/2016  . 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 Dementia with behavioral disturbance 09/02/2016  . Asthma with acute exacerbation 08/21/2016  . Atrial fibrillation with RVR ([place]) 08/21/2016  . Elevated INR 08/21/2016  . Acute on chronic systolic CHF (congestive heart failure) ([place]) 08/21/2016  . Hyperlipidemia 08/21/2016  . Electrolyte abnormality 08/21/2016  . HCAP (healthcare-associated pneumonia) 08/10/2016  . Sepsis ([place]) 08/09/2016  . Coronary atherosclerosis of native coronary artery 01/21/2014  . Obesity 01/17/2014  . Abdominal pain, generalized 10/18/2012  . GERD (gastroesophageal reflux disease)   . Mood disorder ([place])   . Anxiety   . 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[place])   . Stroke ([place])   . Chest pain 01/07/2012  . Asthma 01/07/2012  . A-fib ([place]) 01/07/2012  . 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 Anemia 01/07/2012  . ANEMIA 07/29/2010  . BARRETTS ESOPHAGUS 06/04/2010    CMP     Component Value Date/Time   NA 138 06/19/2017 0431   NA 138 04/08/2017   K 3.5 06/19/2017 0431   CL 107 06/19/2017 0431   CO2 24 06/19/2017 0431   GLUCOSE 102 (H) 06/19/2017 0431   BUN 10 06/19/2017 0431   BUN 41 (A) 04/08/2017   CREATININE 0.94 06/19/2017 0431    CALCIUM 8.9 06/19/2017 0431   PROT 5.7 (L) 06/19/2017 0431   ALBUMIN 3.1 (L) 06/19/2017 0431   AST 16 06/19/2017 0431   ALT 14 06/19/2017 0431   ALKPHOS 53 06/19/2017 0431   BILITOT 0.7 06/19/2017 0431  GFRNONAA 57 (L) 06/19/2017 0431   GFRAA >60 06/19/2017 0431    Recent Labs  06/17/17 0441 06/18/17 0346 06/19/17 0431  NA 138 140 138  K 3.7 3.7 3.5  CL 105 107 107  CO2 24 24 24   GLUCOSE 89 100* 102*  BUN 13 9 10   CREATININE 0.72 0.94 0.94  CALCIUM 9.0 8.9 8.9  MG  --  1.7 1.6*  PHOS  --  3.4 3.0    Recent Labs  09/29/16 06/18/17 0346 06/19/17 0431  AST 21 14* 16  ALT 19 10* 14  ALKPHOS 61 52 53  BILITOT  --  0.5 0.7  PROT  --  5.6* 5.7*  ALBUMIN  --  3.1* 3.1*    Recent Labs  06/17/17 0441 06/18/17 0346 06/19/17 0431  WBC 6.7 5.6 6.5  NEUTROABS  --  3.6 4.2  HGB 12.9 11.8* 12.2  HCT 38.1 36.0 37.3  MCV 83.2 84.7 84.8  PLT 129* 153 147*    Recent Labs  09/29/16 06/19/17 0433  CHOL 172 109  LDLCALC 73 37  TRIG 128 68   No results found for: [facility] Lab Results  Component Value Date   TSH 4.09 04/08/2017   Lab Results  Component Value Date   HGBA1C 5.4 04/08/2017   Lab Results  Component Value Date   CHOL 109 06/19/2017   HDL 58 06/19/2017   LDLCALC 37 06/19/2017   TRIG 68 06/19/2017   CHOLHDL 1.9 06/19/2017    Significant Diagnostic Results in last 30 days:  No results found.  Assessment and Plan  Coronary atherosclerosis of native coronary artery Posterior chest pain or need for supplemental nitroglycerin; plan to continue Imdur 30 mg by mouth daily, Coreg 3.125 mg by mouth twice a day and ASA 81 mg by mouth daily; patient is on statin  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HCC) Blood pressures are well controlled today but has been in the prior visits; therefore we will monitor before changing regimen; plan to continue Coreg 3.125 mg by mouth twice a day, and Imdur 30 mg by mouth daily  Stroke  [facility]) Status post no new problems; continue ASA 81 mg daily as prophylaxis along with control of lipids, patient is on statin and control of blood pressure, patient is on Coreg and Imdur    Euel Castile D. [name], MD

## 2017-08-13 NOTE — Assessment & Plan Note (Signed)
Blood pressures are well controlled today but has been in the prior visits; therefore we will monitor before changing regimen; plan to continue Coreg 3.125 mg by mouth twice a day, and Imdur 30 mg by mouth daily

## 2017-08-13 NOTE — Assessment & Plan Note (Signed)
Posterior chest pain or need for supplemental nitroglycerin; plan to continue Imdur 30 mg by mouth daily, Coreg 3.125 mg by mouth twice a day and ASA 81 mg by mouth daily; patient is on statin

## 2017-08-13 NOTE — Assessment & Plan Note (Signed)
Status post no new problems; continue ASA 81 mg daily as prophylaxis along with control of lipids, patient is on statin and control of blood pressure, patient is on Coreg and Imdur

## 2017-08-19 ENCOUNTER — Non-Acute Institutional Stay (SKILLED_NURSING_FACILITY): Payer: Medicare Other | Admitting: Internal Medicine

## 2017-08-19 DIAGNOSIS — E034 Atrophy of thyroid (acquired): Secondary | ICD-10-CM

## 2017-08-19 DIAGNOSIS — F329 Major depressive disorder, single episode, unspecified: Secondary | ICD-10-CM | POA: Diagnosis not present

## 2017-08-19 DIAGNOSIS — D508 Other iron deficiency anemias: Secondary | ICD-10-CM

## 2017-08-19 DIAGNOSIS — F32A Depression, unspecified: Secondary | ICD-10-CM

## 2017-08-24 ENCOUNTER — Encounter: Payer: Self-pay | Admitting: Internal Medicine

## 2017-08-24 NOTE — Progress Notes (Signed)
Location:  [facility] Room Number: [place]:  SNF [phone])  Provider: Noah Delaine. [name], MD  [name], MD  Patient Care Team: [name], MD as PCP - General [facility] Medicine)  Extended Emergency Contact Information Primary Emergency Contact: Tyner,[name] Address: [address] [place], [place] [address] [name] of [place] Phone: [phone] Relation: Daughter Secondary Emergency Contact: Drue Novel [place] of [place] Phone: [phone] Relation: Niece    Allergies: Patient has no known allergies.  Chief Complaint  Patient presents with  . Medical Management of Chronic Issues    routine visit    HPI: Patient is 76 y.o. female who is being seen for routine issues of depression, iron deficiency anemia, and hypothyroidism.  Past Medical History:  Diagnosis Date  . A-fib ([place]) 01/07/2012  . ANEMIA 07/29/2010   Qualifier: Diagnosis of  By: Bobby Rumpf CMA (AAMA), Patty    . Angina pectoris ([place])   . Anxiety   . Asthma   . Asthma 01/07/2012  . Barrett's esophagus   . Bronchitis   . 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 Chest pain   . Coronary artery disease    right carotid occlusion   . Dementia with behavioral disturbance 09/02/2016  . Depression   . Dysrhythmia    atrial fibrillation  . GERD (gastroesophageal reflux disease)   . 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 Hyperlipidemia   . Hypertension   . Mood disorder ([place])   . Paranoia ([place]) 10/03/2016  . Recurrent upper respiratory infection (URI)   . Stroke [facility])    2007, after left endarterectomy    Past Surgical History:  Procedure Laterality Date  . ABDOMINAL HYSTERECTOMY    . CARDIAC CATHETERIZATION    . CAROTID ENDARTERECTOMY     left, complicated by stroke  . CHOLECYSTECTOMY     incidental at time of colectomy  . 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 Always use your most recent med list.          acetaminophen 325 MG tablet Commonly known as:  TYLENOL Take 2 tablets (650 mg total) by mouth every 6 (six) hours as needed for mild pain (or Fever >/= 101).   albuterol 108 (90 Base) MCG/ACT inhaler Commonly known as:  PROVENTIL HFA;VENTOLIN HFA Inhale 2 puffs into the lungs every 6 (six) hours as needed for shortness of breath.   alendronate 70 MG tablet Commonly known as:  FOSAMAX Take 70 mg by mouth every Monday. Take with a full glass of water on an empty stomach.   aspirin EC 81 MG tablet Take 81 mg by mouth daily.   budesonide-formoterol 160-4.5 MCG/ACT inhaler Commonly known as:  SYMBICORT Inhale 2 puffs into the lungs 2 (two) times daily.   busPIRone 10 MG tablet Commonly known as:  BUSPAR Take 10 mg by mouth 3 (three) times daily.   carvedilol 3.125 MG tablet Commonly known as:  COREG Take 1 tablet (3.125 mg total) by mouth 2 (two) times daily with a meal.   dicyclomine 10 MG capsule Commonly known as:  BENTYL Take 10 mg by mouth. Take one capsule three times daily for IBS  ferrous sulfate 325 (65 FE) MG tablet Take 325 mg by mouth daily with breakfast.   fluticasone 50 MCG/ACT nasal spray Commonly known as:  FLONASE Place 1 spray into both nostrils daily.   guaifenesin 100 MG/5ML syrup Commonly known as:  ROBITUSSIN Take 200 mg by mouth every 6 (six) hours as needed for cough.   ipratropium-albuterol 0.5-2.5 (3) MG/3ML Soln Commonly known as:  DUONEB Take 3 mLs by nebulization every 4 (four) hours as needed.   isosorbide mononitrate 30 MG 24 hr tablet Commonly known as:  IMDUR Take 30 mg by mouth daily.   levothyroxine 25 MCG tablet Commonly known as:  SYNTHROID, LEVOTHROID Take 25 mcg by mouth daily before breakfast.   loratadine 10 MG tablet Commonly known as:  CLARITIN Take 10 mg by mouth daily.     mirtazapine 15 MG tablet Commonly known as:  REMERON Take 7.5 mg by mouth at bedtime. 1/2 TAB   nitroGLYCERIN 0.4 MG SL tablet Commonly known as:  NITROSTAT Place 0.4 mg under the tongue every 5 (five) minutes as needed for chest pain.   omeprazole 40 MG capsule Commonly known as:  PRILOSEC Take 40 mg by mouth daily.   polyethylene glycol packet Commonly known as:  MIRALAX / GLYCOLAX Take 17 g by mouth daily as needed.   QUEtiapine 100 MG tablet Commonly known as:  SEROQUEL Take 100 mg by mouth daily.   QUEtiapine 50 MG tablet Commonly known as:  SEROQUEL Take 50 mg by mouth at bedtime.   Rivaroxaban 15 MG Tabs tablet Commonly known as:  XARELTO Take 15 mg by mouth daily.   sertraline 50 MG tablet Commonly known as:  ZOLOFT Take 50 mg by mouth at bedtime.   simvastatin 10 MG tablet Commonly known as:  ZOCOR Take 10 mg by mouth at bedtime.   sodium chloride 0.65 % nasal spray Commonly known as:  OCEAN Place 1 spray into the nose every 4 (four) hours as needed for congestion. Into both nares   tiotropium 18 MCG inhalation capsule Commonly known as:  SPIRIVA Place 18 mcg into inhaler and inhale daily.   traZODone 150 MG tablet Commonly known as:  DESYREL Take 100 mg by mouth at bedtime.   TRAZODONE HCL ER PO Take 175 mg by mouth at bedtime.   Vitamin D3 1000 units Caps Take 1 capsule by mouth daily.       No orders of the defined types were placed in this encounter.   Immunization History  Administered Date(s) Administered  . Influenza-Unspecified 08/26/2016  . PPD Test 08/16/2016  . Pneumococcal Polysaccharide-23 01/08/2012    Social History   Tobacco Use  . Smoking status: Former Smoker    Last attempt to quit: 04/09/2003    Years since quitting: 14.5  . Smokeless tobacco: Never Used  Substance Use Topics  . Alcohol use: No    Review of Systems  DATA OBTAINED: from patient, nurse GENERAL:  no fevers, fatigue, appetite changes SKIN: No  itching, rash HEENT: No complaint RESPIRATORY: No cough, wheezing, SOB CARDIAC: No chest pain, palpitations, lower extremity edema  GI: No abdominal pain, No N/V/D or constipation, No heartburn or reflux  GU: No dysuria, frequency or urgency, or incontinence  MUSCULOSKELETAL: No unrelieved bone/joint pain NEUROLOGIC: No headache, dizziness  PSYCHIATRIC: No overt anxiety or sadness  Vitals:   08/19/17 1443  BP: (!) 184/85  Pulse: 78  Resp: 20  Temp: 98.4 F (36.9 C)   Body mass index is 29.69 kg/m.  Physical Exam  GENERAL APPEARANCE: Alert, conversant, No acute distress  SKIN: No diaphoresis rash HEENT: Unremarkable RESPIRATORY: Breathing is even, unlabored. Lung sounds are clear   CARDIOVASCULAR: Heart RRR no murmurs, rubs or gallops. No peripheral edema  GASTROINTESTINAL: Abdomen is soft, non-tender, not distended w/ normal bowel sounds.  GENITOURINARY: Bladder non tender, not distended  MUSCULOSKELETAL: No abnormal joints or musculature NEUROLOGIC: Cranial nerves 2-12 grossly intact. Moves all extremities PSYCHIATRIC: Mood and affect appropriate to situation with some dementia, has had intermittent behavioral issues  Patient Active Problem List   Diagnosis Date Noted  . Mania ([place]) 06/21/2017  . Hypomagnesemia 06/19/2017  . Essential hypertension 06/18/2017  . Acute encephalopathy 06/17/2017  . Acute lower UTI 06/17/2017  . E. coli UTI 06/17/2017  . Depression 04/24/2017  . Hypothyroidism 10/03/2016  . Paranoia ([place]) 10/03/2016  . LOC (loss of consciousness) ([place]) 09/02/2016  . 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 Dementia with behavioral disturbance 09/02/2016  . Asthma with acute exacerbation 08/21/2016  . Atrial fibrillation with RVR ([place]) 08/21/2016  . Elevated INR 08/21/2016  . Acute on chronic systolic CHF (congestive heart failure) ([place]) 08/21/2016  . Hyperlipidemia 08/21/2016  . Electrolyte abnormality 08/21/2016  . HCAP (healthcare-associated  pneumonia) 08/10/2016  . Sepsis ([place]) 08/09/2016  . Coronary atherosclerosis of native coronary artery 01/21/2014  . Obesity 01/17/2014  . Abdominal pain, generalized 10/18/2012  . GERD (gastroesophageal reflux disease)   . Mood disorder ([place])   . Anxiety   . 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[place])   . Stroke ([place])   . Chest pain 01/07/2012  . Asthma 01/07/2012  . A-fib ([place]) 01/07/2012  . 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 Anemia 01/07/2012  . ANEMIA 07/29/2010  . BARRETTS ESOPHAGUS 06/04/2010    CMP     Component Value Date/Time   NA 138 06/19/2017 0431   NA 138 04/08/2017   K 3.5 06/19/2017 0431   CL 107 06/19/2017 0431   CO2 24 06/19/2017 0431   GLUCOSE 102 (H) 06/19/2017 0431   BUN 10 06/19/2017 0431   BUN 41 (A) 04/08/2017   CREATININE 0.94 06/19/2017 0431   CALCIUM 8.9 06/19/2017 0431   PROT 5.7 (L) 06/19/2017 0431   ALBUMIN 3.1 (L) 06/19/2017 0431   AST 16 06/19/2017 0431   ALT 14 06/19/2017 0431   ALKPHOS 53 06/19/2017 0431   BILITOT 0.7 06/19/2017 0431   GFRNONAA 57 (L) 06/19/2017 0431   GFRAA >60 06/19/2017 0431   Recent Labs    06/17/17 0441 06/18/17 0346 06/19/17 0431  NA 138 140 138  K 3.7 3.7 3.5  CL 105 107 107  CO2 24 24 24   GLUCOSE 89 100* 102*  BUN 13 9 10   CREATININE 0.72 0.94 0.94  CALCIUM 9.0 8.9 8.9  MG  --  1.7 1.6*  PHOS  --  3.4 3.0   Recent Labs    06/18/17 0346 06/19/17 0431  AST 14* 16  ALT 10* 14  ALKPHOS 52 53  BILITOT 0.5 0.7  PROT 5.6* 5.7*  ALBUMIN 3.1* 3.1*   Recent Labs    06/17/17 0441 06/18/17 0346 06/19/17 0431  WBC 6.7 5.6 6.5  NEUTROABS  --  3.6 4.2  HGB 12.9 11.8* 12.2  HCT 38.1 36.0 37.3  MCV 83.2 84.7 84.8  PLT 129* 153 147*   Recent Labs    06/19/17 0433  CHOL 109  LDLCALC 37  TRIG 68   No results found  for: [facility] Lab Results  Component Value Date   TSH 4.09 04/08/2017   Lab Results  Component Value Date   HGBA1C 5.4 04/08/2017    Lab Results  Component Value Date   CHOL 109 06/19/2017   HDL 58 06/19/2017   LDLCALC 37 06/19/2017   TRIG 68 06/19/2017   CHOLHDL 1.9 06/19/2017    Significant Diagnostic Results in last 30 days:  No results found.  Assessment and Plan  Depression Chronic, relatively stable; plan to continue 150 mg by mouth daily at bedtime and Zoloft 50 mg by mouth daily  Anemia Most recent hemoglobin 12.2 which is improved from prior; plan to continue iron 325 mg by mouth daily  Hypothyroidism TSH 4.09; plan to continue Synthroid 25 g daily     Vashaun Osmon D. [name], MD

## 2017-08-29 ENCOUNTER — Non-Acute Institutional Stay (SKILLED_NURSING_FACILITY): Payer: Medicare Other | Admitting: Internal Medicine

## 2017-08-29 ENCOUNTER — Encounter: Payer: Self-pay | Admitting: Internal Medicine

## 2017-08-29 DIAGNOSIS — H8113 Benign paroxysmal vertigo, bilateral: Secondary | ICD-10-CM | POA: Diagnosis not present

## 2017-08-29 NOTE — Progress Notes (Signed)
Location:  [place] Room Number: [phone] Place of Service:  SNF [phone])  Provider: Noah Delaine. [name], MD  [name], MD  Patient Care Team: [name], MD as PCP - General [facility] Medicine)  Extended Emergency Contact Information Primary Emergency Contact: Tyner,[name] Address: [address] [place], [place] [address] [name] of [place] Phone: [phone] Relation: Daughter Secondary Emergency Contact: Drue Novel [place] of [place] Phone: [phone] Relation: Niece    Allergies: Patient has no known allergies.  Chief Complaint  Patient presents with  . Acute Visit    veritgo, had 2 falls within 10 minutes    HPI: Patient is 76 y.o. female who Nursing asked me to see because patient is complaining of feeling dizzy. When I asked patient if she admits it was quick in onset and that she feels like she is drunk or the room is spinning. Patient has had this before. This is made worse by movement, better with rest and being still. Patient denies any nausea or vomiting. Patient has fallen secondary to this but no injury. Patient has no numbness tingling or focal weakness anywhere else in her body.  Past Medical History:  Diagnosis Date  . A-fib ([place]) 01/07/2012  . ANEMIA 07/29/2010   Qualifier: Diagnosis of  By: Bobby Rumpf CMA (AAMA), Patty    . Angina pectoris ([place])   . Anxiety   . Asthma   . Asthma 01/07/2012  . Barrett's esophagus   . Bronchitis   . 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 Chest pain   . Coronary artery disease    right carotid occlusion   . Dementia with behavioral disturbance 09/02/2016  . Depression   . Dysrhythmia    atrial fibrillation  . GERD (gastroesophageal reflux disease)   . 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 Hyperlipidemia   . Hypertension   . Mood disorder ([place])   . Paranoia ([place]) 10/03/2016  . Recurrent upper respiratory infection (URI)   . Stroke [facility])    2007, after left  endarterectomy    Past Surgical History:  Procedure Laterality Date  . ABDOMINAL HYSTERECTOMY    . BALLOON DILATION  10/18/2012   Procedure: BALLOON DILATION;  Surgeon: Lear Ng, MD;  Location: [facility] ENDOSCOPY;  Service: Endoscopy;  Laterality: N/A;  . CARDIAC CATHETERIZATION    . CAROTID ENDARTERECTOMY     left, complicated by stroke  . CHOLECYSTECTOMY     incidental at time of colectomy  . COLONOSCOPY  12/01/2011   Procedure: COLONOSCOPY;  Surgeon: Lear Ng, MD;  Location: [facility] ENDOSCOPY;  Service: Endoscopy;  Laterality: N/A;  . COLONOSCOPY  10/18/2012   Procedure: COLONOSCOPY;  Surgeon: Lear Ng, MD;  Location: [facility] ENDOSCOPY;  Service: Endoscopy;  Laterality: N/A;  colonic dilation  . ESOPHAGOGASTRODUODENOSCOPY  12/01/2011   Procedure: ESOPHAGOGASTRODUODENOSCOPY (EGD);  Surgeon: Lear Ng, MD;  Location: [facility];  Service: Endoscopy;  Laterality: N/A;  . HOT HEMOSTASIS  12/01/2011   Procedure: HOT HEMOSTASIS (ARGON PLASMA COAGULATION/BICAP);  Surgeon: Lear Ng, MD;  Location: [facility];  Service: Endoscopy;  Laterality: N/A;  . 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 Always use your  most recent med list.          acetaminophen 325 MG tablet Commonly known as:  TYLENOL Take 2 tablets (650 mg total) by mouth every 6 (six) hours as needed for mild pain (or Fever >/= 101).   albuterol 108 (90 Base) MCG/ACT inhaler Commonly known as:  PROVENTIL HFA;VENTOLIN HFA Inhale 2 puffs into the lungs every 6 (six) hours as needed for shortness of breath.   alendronate 70 MG tablet Commonly known as:  FOSAMAX Take 70 mg by mouth every Monday. Take with a full glass of water on an empty stomach.   aspirin EC 81 MG tablet Take 81 mg by mouth daily.   budesonide-formoterol 160-4.5  MCG/ACT inhaler Commonly known as:  SYMBICORT Inhale 2 puffs into the lungs 2 (two) times daily.   busPIRone 10 MG tablet Commonly known as:  BUSPAR Take 10 mg by mouth 3 (three) times daily.   carvedilol 3.125 MG tablet Commonly known as:  COREG Take 1 tablet (3.125 mg total) by mouth 2 (two) times daily with a meal.   dicyclomine 10 MG capsule Commonly known as:  BENTYL Take 10 mg by mouth. Take one capsule three times daily for IBS   ferrous sulfate 325 (65 FE) MG tablet Take 325 mg by mouth daily with breakfast.   fluticasone 50 MCG/ACT nasal spray Commonly known as:  FLONASE Place 1 spray into both nostrils daily.   guaifenesin 100 MG/5ML syrup Commonly known as:  ROBITUSSIN Take 200 mg by mouth every 6 (six) hours as needed for cough.   ipratropium-albuterol 0.5-2.5 (3) MG/3ML Soln Commonly known as:  DUONEB Take 3 mLs by nebulization every 4 (four) hours as needed.   isosorbide mononitrate 30 MG 24 hr tablet Commonly known as:  IMDUR Take 30 mg by mouth daily.   levothyroxine 25 MCG tablet Commonly known as:  SYNTHROID, LEVOTHROID Take 25 mcg by mouth daily before breakfast.   loratadine 10 MG tablet Commonly known as:  CLARITIN Take 10 mg by mouth daily.   mirtazapine 15 MG tablet Commonly known as:  REMERON Take 7.5 mg by mouth at bedtime. 1/2 TAB   nitroGLYCERIN 0.4 MG SL tablet Commonly known as:  NITROSTAT Place 0.4 mg under the tongue every 5 (five) minutes as needed for chest pain.   omeprazole 40 MG capsule Commonly known as:  PRILOSEC Take 40 mg by mouth daily.   polyethylene glycol packet Commonly known as:  MIRALAX / GLYCOLAX Take 17 g by mouth daily as needed.   QUEtiapine 100 MG tablet Commonly known as:  SEROQUEL Take 100 mg by mouth daily.   QUEtiapine 50 MG tablet Commonly known as:  SEROQUEL Take 50 mg by mouth at bedtime.   Rivaroxaban 15 MG Tabs tablet Commonly known as:  XARELTO Take 15 mg by mouth daily.   sertraline  50 MG tablet Commonly known as:  ZOLOFT Take 50 mg by mouth at bedtime.   simvastatin 10 MG tablet Commonly known as:  ZOCOR Take 10 mg by mouth at bedtime.   sodium chloride 0.65 % nasal spray Commonly known as:  OCEAN Place 1 spray into the nose every 4 (four) hours as needed for congestion. Into both nares   tiotropium 18 MCG inhalation capsule Commonly known as:  SPIRIVA Place 18 mcg into inhaler and inhale daily.   traZODone 150 MG tablet Commonly known as:  DESYREL Take 100 mg by mouth at bedtime.   TRAZODONE HCL ER PO Take 175 mg by  mouth at bedtime.   Vitamin D3 1000 units Caps Take 1 capsule by mouth daily.       No orders of the defined types were placed in this encounter.   Immunization History  Administered Date(s) Administered  . Influenza-Unspecified 08/26/2016  . PPD Test 08/16/2016  . Pneumococcal Polysaccharide-23 01/08/2012    Social History  Substance Use Topics  . Smoking status: Former Smoker    Quit date: 04/09/2003  . Smokeless tobacco: Never Used  . Alcohol use No    Review of Systems  DATA OBTAINED: from patient, nurse GENERAL:  no fevers, fatigue, appetite changes SKIN: No itching, rash HEENT: No complaint RESPIRATORY: No cough, wheezing, SOB CARDIAC: No chest pain, palpitations, lower extremity edema  GI: No abdominal pain, No N/V/D or constipation, No heartburn or reflux  GU: No dysuria, frequency or urgency, or incontinence  MUSCULOSKELETAL: No unrelieved bone/joint pain NEUROLOGIC: No headache, +dizziness  PSYCHIATRIC: No overt anxiety or sadness  Vitals:   08/29/17 1725  BP: 132/83  Pulse: 78  Resp: 17  Temp: 98.4 F (36.9 C)   Body mass index is 29.69 kg/m. Physical Exam  GENERAL APPEARANCE: Alert, conversant, No acute distress  SKIN: No diaphoresis rash HEENT: Unremarkable RESPIRATORY: Breathing is even, unlabored. Lung sounds are clear   CARDIOVASCULAR: Heart RRR no murmurs, rubs or gallops. No peripheral  edema  GASTROINTESTINAL: Abdomen is soft, non-tender, not distended w/ normal bowel sounds.  GENITOURINARY: Bladder non tender, not distended  MUSCULOSKELETAL: No abnormal joints or musculature NEUROLOGIC: Cranial nerves 2-12 grossly intact. Moves all extremities PSYCHIATRIC: Mood and affect appropriate to situation, no behavioral issues   Patient Active Problem List   Diagnosis Date Noted  . Mania ([place]) 06/21/2017  . Hypomagnesemia 06/19/2017  . Essential hypertension 06/18/2017  . Acute encephalopathy 06/17/2017  . Acute lower UTI 06/17/2017  . E. coli UTI 06/17/2017  . Depression 04/24/2017  . Hypothyroidism 10/03/2016  . Paranoia ([place]) 10/03/2016  . LOC (loss of consciousness) ([place]) 09/02/2016  . 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 Dementia with behavioral disturbance 09/02/2016  . Asthma with acute exacerbation 08/21/2016  . Atrial fibrillation with RVR ([place]) 08/21/2016  . Elevated INR 08/21/2016  . Acute on chronic systolic CHF (congestive heart failure) ([place]) 08/21/2016  . Hyperlipidemia 08/21/2016  . Electrolyte abnormality 08/21/2016  . HCAP (healthcare-associated pneumonia) 08/10/2016  . Sepsis ([place]) 08/09/2016  . Coronary atherosclerosis of native coronary artery 01/21/2014  . Obesity 01/17/2014  . Abdominal pain, generalized 10/18/2012  . GERD (gastroesophageal reflux disease)   . Mood disorder ([place])   . Anxiety   . 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[place])   . Stroke ([place])   . Chest pain 01/07/2012  . Asthma 01/07/2012  . A-fib ([place]) 01/07/2012  . 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 Anemia 01/07/2012  . ANEMIA 07/29/2010  . BARRETTS ESOPHAGUS 06/04/2010    CMP     Component Value Date/Time   NA 138 06/19/2017 0431   NA 138 04/08/2017   K 3.5 06/19/2017 0431   CL 107 06/19/2017 0431   CO2 24 06/19/2017 0431   GLUCOSE 102 (H) 06/19/2017 0431   BUN 10 06/19/2017 0431   BUN 41 (A) 04/08/2017    CREATININE 0.94 06/19/2017 0431   CALCIUM 8.9 06/19/2017 0431   PROT 5.7 (L) 06/19/2017 0431   ALBUMIN 3.1 (L) 06/19/2017 0431   AST 16 06/19/2017 0431   ALT 14 06/19/2017 0431   ALKPHOS 53  06/19/2017 0431   BILITOT 0.7 06/19/2017 0431   GFRNONAA 57 (L) 06/19/2017 0431   GFRAA >60 06/19/2017 0431    Recent Labs  06/17/17 0441 06/18/17 0346 06/19/17 0431  NA 138 140 138  K 3.7 3.7 3.5  CL 105 107 107  CO2 24 24 24   GLUCOSE 89 100* 102*  BUN 13 9 10   CREATININE 0.72 0.94 0.94  CALCIUM 9.0 8.9 8.9  MG  --  1.7 1.6*  PHOS  --  3.4 3.0    Recent Labs  09/29/16 06/18/17 0346 06/19/17 0431  AST 21 14* 16  ALT 19 10* 14  ALKPHOS 61 52 53  BILITOT  --  0.5 0.7  PROT  --  5.6* 5.7*  ALBUMIN  --  3.1* 3.1*    Recent Labs  06/17/17 0441 06/18/17 0346 06/19/17 0431  WBC 6.7 5.6 6.5  NEUTROABS  --  3.6 4.2  HGB 12.9 11.8* 12.2  HCT 38.1 36.0 37.3  MCV 83.2 84.7 84.8  PLT 129* 153 147*    Recent Labs  09/29/16 06/19/17 0433  CHOL 172 109  LDLCALC 73 37  TRIG 128 68   No results found for: [facility] Lab Results  Component Value Date   TSH 4.09 04/08/2017   Lab Results  Component Value Date   HGBA1C 5.4 04/08/2017   Lab Results  Component Value Date   CHOL 109 06/19/2017   HDL 58 06/19/2017   LDLCALC 37 06/19/2017   TRIG 68 06/19/2017   CHOLHDL 1.9 06/19/2017    Significant Diagnostic Results in last 30 days:  No results found.  Assessment and Plan  BENIGN POSITIONAL VERTIGO-discussed with patient that as soon as she starts having these feelings she should not get up by herself because she is going to fall; will put her on Antivert 5 mg 3 times a day which she will take all day on any day that she becomes dizzy; will continue to monitor  No problem-specific Assessment & Plan notes found for this encounter.    Juanetta Snow. [name], MD

## 2017-09-20 ENCOUNTER — Encounter: Payer: Self-pay | Admitting: Internal Medicine

## 2017-09-20 ENCOUNTER — Non-Acute Institutional Stay (SKILLED_NURSING_FACILITY): Payer: Medicare Other | Admitting: Internal Medicine

## 2017-09-20 DIAGNOSIS — I480 Paroxysmal atrial fibrillation: Secondary | ICD-10-CM

## 2017-09-20 DIAGNOSIS — K219 Gastro-esophageal reflux disease without esophagitis: Secondary | ICD-10-CM | POA: Diagnosis not present

## 2017-09-20 DIAGNOSIS — F22 Delusional disorders: Secondary | ICD-10-CM | POA: Diagnosis not present

## 2017-09-20 NOTE — Progress Notes (Signed)
Location:  [place] Room Number: [place] of Service:     Provider: Noah Delaine. [name], MD  [name], MD  Patient Care Team: [name], MD as PCP - General [facility] Medicine)  Extended Emergency Contact Information Primary Emergency Contact: Tyner,[name] Address: [address] [place], [place] [address] [name] of [place] Phone: [phone] Relation: Daughter Secondary Emergency Contact: Drue [place] of [place] Phone: [phone] Relation: Niece    Allergies: Patient has no known allergies.  Chief Complaint  Patient presents with  . Medical Management of Chronic Issues    routine visit    HPI: Patient is 76 y.o. female who is being seen for routine issues of atrial fibrillation, GERD, and psychosis.  Past Medical History:  Diagnosis Date  . A-fib ([place]) 01/07/2012  . ANEMIA 07/29/2010   Qualifier: Diagnosis of  By: Bobby Rumpf CMA (AAMA), [place]    . Angina pectoris ([place])   . Anxiety   . Asthma   . Asthma 01/07/2012  . Barrett's esophagus   . Bronchitis   . 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 Chest pain   . Coronary artery disease    right carotid occlusion   . Dementia with behavioral disturbance 09/02/2016  . Depression   . Dysrhythmia    atrial fibrillation  . GERD (gastroesophageal reflux disease)   . 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 Hyperlipidemia   . Hypertension   . Mood disorder ([place])   . Paranoia ([place]) 10/03/2016  . Recurrent upper respiratory infection (URI)   . Stroke [facility])    2007, after left endarterectomy    Past Surgical History:  Procedure Laterality Date  . ABDOMINAL HYSTERECTOMY    . CARDIAC CATHETERIZATION    . CAROTID ENDARTERECTOMY     left, complicated by stroke  . CHOLECYSTECTOMY     incidental at time of colectomy  . 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 Always use your most recent med list.          acetaminophen 325 MG tablet Commonly known as:  TYLENOL Take 2 tablets (650 mg total) by mouth every 6 (six) hours as needed for mild pain (or Fever >/= 101).   albuterol 108 (90 Base) MCG/ACT inhaler Commonly known as:  PROVENTIL HFA;VENTOLIN HFA Inhale 2 puffs into the lungs every 6 (six) hours as needed for shortness of breath.   alendronate 70 MG tablet Commonly known as:  FOSAMAX Take 70 mg by mouth every Monday. Take with a full glass of water on an empty stomach.   aspirin EC 81 MG tablet Take 81 mg by mouth daily.   budesonide-formoterol 160-4.5 MCG/ACT inhaler Commonly known as:  SYMBICORT Inhale 2 puffs into the lungs 2 (two) times daily.   busPIRone 10 MG tablet Commonly known as:  BUSPAR Take 10 mg by mouth 3 (three) times daily.   carvedilol 3.125 MG tablet Commonly known as:  COREG Take 1 tablet (3.125 mg total) by mouth 2 (two) times daily with a meal.   dicyclomine 10 MG capsule Commonly known as:  BENTYL Take 10 mg by mouth. Take one capsule three times daily for IBS  feeding supplement (ENSURE ENLIVE) Liqd Take 237 mLs by mouth. Drink once daily due to weight loss   ferrous sulfate 325 (65 FE) MG tablet Take 325 mg by mouth daily with breakfast.   fluticasone 50 MCG/ACT nasal spray Commonly known as:  FLONASE Place 1 spray into both nostrils daily.   guaifenesin 100 MG/5ML syrup Commonly known as:  ROBITUSSIN Take 200 mg by mouth every 6 (six) hours as needed for cough.   ipratropium-albuterol 0.5-2.5 (3) MG/3ML Soln Commonly known as:  DUONEB Take 3 mLs by nebulization every 4 (four) hours as needed.   isosorbide mononitrate 30 MG 24 hr tablet Commonly known as:  IMDUR Take 30 mg by mouth daily.   levothyroxine 25 MCG tablet Commonly known as:  SYNTHROID, LEVOTHROID Take 25 mcg by mouth daily before breakfast.     loratadine 10 MG tablet Commonly known as:  CLARITIN Take 10 mg by mouth daily.   meclizine 12.5 MG tablet Commonly known as:  ANTIVERT Take 12.5 mg by mouth. Take one tablet three times a day as needed for dizziness   mirtazapine 15 MG tablet Commonly known as:  REMERON Take 7.5 mg by mouth at bedtime. 1/2 TAB   nitroGLYCERIN 0.4 MG SL tablet Commonly known as:  NITROSTAT Place 0.4 mg under the tongue every 5 (five) minutes as needed for chest pain.   omeprazole 40 MG capsule Commonly known as:  PRILOSEC Take 40 mg by mouth daily.   polyethylene glycol packet Commonly known as:  MIRALAX / GLYCOLAX Take 17 g by mouth daily as needed.   QUEtiapine 100 MG tablet Commonly known as:  SEROQUEL Take 100 mg by mouth daily.   QUEtiapine 50 MG tablet Commonly known as:  SEROQUEL Take 50 mg by mouth at bedtime.   Rivaroxaban 15 MG Tabs tablet Commonly known as:  XARELTO Take 15 mg by mouth daily.   sertraline 50 MG tablet Commonly known as:  ZOLOFT Take 50 mg by mouth at bedtime.   simvastatin 10 MG tablet Commonly known as:  ZOCOR Take 10 mg by mouth at bedtime.   sodium chloride 0.65 % nasal spray Commonly known as:  OCEAN Place 1 spray into the nose every 4 (four) hours as needed for congestion. Into both nares   tiotropium 18 MCG inhalation capsule Commonly known as:  SPIRIVA Place 18 mcg into inhaler and inhale daily.   traZODone 150 MG tablet Commonly known as:  DESYREL Take 100 mg by mouth at bedtime.   TRAZODONE HCL ER PO Take 175 mg by mouth at bedtime.   Vitamin D3 1000 units Caps Take 1 capsule by mouth daily.       Meds ordered this encounter  Medications  . feeding supplement, ENSURE ENLIVE, (ENSURE ENLIVE) LIQD    Sig: Take 237 mLs by mouth. Drink once daily due to weight loss  . meclizine (ANTIVERT) 12.5 MG tablet    Sig: Take 12.5 mg by mouth. Take one tablet three times a day as needed for dizziness    Immunization History   Administered Date(s) Administered  . Influenza-Unspecified 08/26/2016  . PPD Test 08/16/2016  . Pneumococcal Polysaccharide-23 01/08/2012    Social History   Tobacco Use  . Smoking status: Former Smoker    Last attempt to quit: 04/09/2003    Years since quitting: 14.5  . Smokeless tobacco: Never Used  Substance Use Topics  . Alcohol use: No    Review of Systems  DATA OBTAINED: from patient, nurse GENERAL:  no fevers, fatigue, appetite changes SKIN: No itching, rash HEENT: No complaint RESPIRATORY: No cough, wheezing, SOB CARDIAC: No chest pain, palpitations, lower extremity edema  GI: No abdominal pain, No N/V/D or constipation, No heartburn or reflux  GU: No dysuria, frequency or urgency, or incontinence  MUSCULOSKELETAL: No unrelieved bone/joint pain NEUROLOGIC: No headache, dizziness  PSYCHIATRIC: No overt anxiety or sadness  Vitals:   09/20/17 1542  BP: 100/63  Pulse: 60  Resp: 20  Temp: 98 F (36.7 C)  SpO2: 95%   Body mass index is 28.9 kg/m. Physical Exam  GENERAL APPEARANCE: Alert, conversant, No acute distress  SKIN: No diaphoresis rash HEENT: Unremarkable RESPIRATORY: Breathing is even, unlabored. Lung sounds are clear   CARDIOVASCULAR: Heart RRR no murmurs, rubs or gallops. No peripheral edema  GASTROINTESTINAL: Abdomen is soft, non-tender, not distended w/ normal bowel sounds.  GENITOURINARY: Bladder non tender, not distended  MUSCULOSKELETAL: No abnormal joints or musculature NEUROLOGIC: Cranial nerves 2-12 grossly intact. Moves all extremities PSYCHIATRIC: Mood and affect appropriate to situation with mild dementia, no behavioral issues  Patient Active Problem List   Diagnosis Date Noted  . Mania ([place]) 06/21/2017  . Hypomagnesemia 06/19/2017  . Essential hypertension 06/18/2017  . Acute encephalopathy 06/17/2017  . Acute lower UTI 06/17/2017  . E. coli UTI 06/17/2017  . Depression 04/24/2017  . Hypothyroidism 10/03/2016  . Paranoia ([place])  10/03/2016  . LOC (loss of consciousness) ([place]) 09/02/2016  . 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 Dementia with behavioral disturbance 09/02/2016  . Asthma with acute exacerbation 08/21/2016  . Atrial fibrillation with RVR ([place]) 08/21/2016  . Elevated INR 08/21/2016  . Acute on chronic systolic CHF (congestive heart failure) ([place]) 08/21/2016  . Hyperlipidemia 08/21/2016  . Electrolyte abnormality 08/21/2016  . HCAP (healthcare-associated pneumonia) 08/10/2016  . Sepsis ([place]) 08/09/2016  . Coronary atherosclerosis of native coronary artery 01/21/2014  . Obesity 01/17/2014  . Abdominal pain, generalized 10/18/2012  . GERD (gastroesophageal reflux disease)   . Mood disorder ([place])   . Anxiety   . 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[place])   . Stroke ([place])   . Chest pain 01/07/2012  . Asthma 01/07/2012  . A-fib ([place]) 01/07/2012  . 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 Anemia 01/07/2012  . ANEMIA 07/29/2010  . BARRETTS ESOPHAGUS 06/04/2010    CMP     Component Value Date/Time   NA 138 06/19/2017 0431   NA 138 04/08/2017   K 3.5 06/19/2017 0431   CL 107 06/19/2017 0431   CO2 24 06/19/2017 0431   GLUCOSE 102 (H) 06/19/2017 0431   BUN 10 06/19/2017 0431   BUN 41 (A) 04/08/2017   CREATININE 0.94 06/19/2017 0431   CALCIUM 8.9 06/19/2017 0431   PROT 5.7 (L) 06/19/2017 0431   ALBUMIN 3.1 (L) 06/19/2017 0431   AST 16 06/19/2017 0431   ALT 14 06/19/2017 0431   ALKPHOS 53 06/19/2017 0431   BILITOT 0.7 06/19/2017 0431   GFRNONAA 57 (L) 06/19/2017 0431   GFRAA >60 06/19/2017 0431   Recent Labs    06/17/17 0441 06/18/17 0346 06/19/17 0431  NA 138 140 138  K 3.7 3.7 3.5  CL 105 107 107  CO2 24 24 24   GLUCOSE 89 100* 102*  BUN 13 9 10   CREATININE 0.72 0.94 0.94  CALCIUM 9.0 8.9 8.9  MG  --  1.7 1.6*  PHOS  --  3.4 3.0   Recent Labs    06/18/17 0346  06/19/17 0431  AST 14* 16  ALT 10* 14  ALKPHOS 52 53  BILITOT 0.5  0.7  PROT 5.6* 5.7*  ALBUMIN 3.1* 3.1*   Recent Labs    06/17/17 0441 06/18/17 0346 06/19/17 0431  WBC 6.7 5.6 6.5  NEUTROABS  --  3.6 4.2  HGB 12.9 11.8* 12.2  HCT 38.1 36.0 37.3  MCV 83.2 84.7 84.8  PLT 129* 153 147*   Recent Labs    06/19/17 0433  CHOL 109  LDLCALC 37  TRIG 68   No results found for: [facility] Lab Results  Component Value Date   TSH 4.09 04/08/2017   Lab Results  Component Value Date   HGBA1C 5.4 04/08/2017   Lab Results  Component Value Date   CHOL 109 06/19/2017   HDL 58 06/19/2017   LDLCALC 37 06/19/2017   TRIG 68 06/19/2017   CHOLHDL 1.9 06/19/2017    Significant Diagnostic Results in last 30 days:  No results found.  Assessment and Plan  A-fib ([place]) Stable; continue xarelto 15 mg daily as prophylaxis and Coreg 3.125 mg by mouth twice a day for rate control  GERD (gastroesophageal reflux disease) No reports of problems; continue omeprazole 40 mg by mouth daily  Paranoia ([place]) As had a flare in the past couple months but is now back to baseline; plan to continue Seroquel 150 mg by mouth daily    Keyleigh Manninen D. [name], MD

## 2017-10-08 ENCOUNTER — Encounter: Payer: Self-pay | Admitting: Internal Medicine

## 2017-10-08 NOTE — Assessment & Plan Note (Signed)
No reports of problems; continue omeprazole 40 mg by mouth daily

## 2017-10-08 NOTE — Assessment & Plan Note (Signed)
Chronic, relatively stable; plan to continue 150 mg by mouth daily at bedtime and Zoloft 50 mg by mouth daily

## 2017-10-08 NOTE — Assessment & Plan Note (Signed)
Stable; continue xarelto 15 mg daily as prophylaxis and Coreg 3.125 mg by mouth twice a day for rate control

## 2017-10-08 NOTE — Assessment & Plan Note (Signed)
Most recent hemoglobin 12.2 which is improved from prior; plan to continue iron 325 mg by mouth daily

## 2017-10-08 NOTE — Assessment & Plan Note (Signed)
TSH 4.09; plan to continue Synthroid 25 g daily

## 2017-10-08 NOTE — Assessment & Plan Note (Signed)
As had a flare in the past couple months but is now back to baseline; plan to continue Seroquel 150 mg by mouth daily

## 2017-10-27 ENCOUNTER — Non-Acute Institutional Stay (SKILLED_NURSING_FACILITY): Payer: Medicare Other | Admitting: Internal Medicine

## 2017-10-27 ENCOUNTER — Encounter: Payer: Self-pay | Admitting: Internal Medicine

## 2017-10-27 DIAGNOSIS — G301 Alzheimer's disease with late onset: Secondary | ICD-10-CM | POA: Diagnosis not present

## 2017-10-27 DIAGNOSIS — K219 Gastro-esophageal reflux disease without esophagitis: Secondary | ICD-10-CM | POA: Diagnosis not present

## 2017-10-27 DIAGNOSIS — F0281 Dementia in other diseases classified elsewhere with behavioral disturbance: Secondary | ICD-10-CM

## 2017-10-27 DIAGNOSIS — F02818 Dementia in other diseases classified elsewhere, unspecified severity, with other behavioral disturbance: Secondary | ICD-10-CM

## 2017-10-27 DIAGNOSIS — E034 Atrophy of thyroid (acquired): Secondary | ICD-10-CM

## 2017-10-27 NOTE — Progress Notes (Signed)
Location:  [place] Room Number: [phone] Place of Service:  SNF (31)  [name], MD  Patient Care Team: [name], MD as PCP - General (Internal Medicine)  Extended Emergency Contact Information Primary Emergency Contact: Tyner,[name] Address: [address] [place], [place] [address] [name] Work Phone: [phone] Mobile Phone: [phone] Relation: Daughter Secondary Emergency Contact: Ammarie, Matsuura Mobile Phone: [phone] Relation: Son    Allergies: Patient has no known allergies.  Chief Complaint  Patient presents with  . Medical Management of Chronic Issues    routine visit  . Health Maintenance    order written for patient to get Prevnar vaccine    HPI: Patient is 76 y.o. female who is being seen for routine issues of dementia, GERD, and hypothyroidism.  Past Medical History:  Diagnosis Date  . A-fib ([place]) 01/07/2012  . ANEMIA 07/29/2010   Qualifier: Diagnosis of  By: Bobby Rumpf CMA (AAMA), [place]    . Angina pectoris ([place])   . Anxiety   . Asthma   . Asthma 01/07/2012  . Barrett's esophagus   . Bronchitis   . 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 Chest pain   . Coronary artery disease    right carotid occlusion   . Dementia with behavioral disturbance 09/02/2016  . Depression   . Dysrhythmia    atrial fibrillation  . GERD (gastroesophageal reflux disease)   . 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 Hyperlipidemia   . Hypertension   . Mood disorder ([place])   . Paranoia ([place]) 10/03/2016  . Recurrent upper respiratory infection (URI)   . Stroke [facility])    2007, after left endarterectomy    Past Surgical History:  Procedure Laterality Date  . ABDOMINAL HYSTERECTOMY    . BALLOON DILATION  10/18/2012   Procedure: BALLOON DILATION;  Surgeon: Lear Ng, MD;  Location: [facility] ENDOSCOPY;  Service: Endoscopy;  Laterality: N/A;  . CARDIAC CATHETERIZATION    . CAROTID ENDARTERECTOMY     left,  complicated by stroke  . CHOLECYSTECTOMY     incidental at time of colectomy  . COLONOSCOPY  12/01/2011   Procedure: COLONOSCOPY;  Surgeon: Lear Ng, MD;  Location: [facility] ENDOSCOPY;  Service: Endoscopy;  Laterality: N/A;  . COLONOSCOPY  10/18/2012   Procedure: COLONOSCOPY;  Surgeon: Lear Ng, MD;  Location: [facility];  Service: Endoscopy;  Laterality: N/A;  colonic dilation  . ESOPHAGOGASTRODUODENOSCOPY  12/01/2011   Procedure: ESOPHAGOGASTRODUODENOSCOPY (EGD);  Surgeon: Lear Ng, MD;  Location: [facility];  Service: Endoscopy;  Laterality: N/A;  . HOT HEMOSTASIS  12/01/2011   Procedure: HOT HEMOSTASIS (ARGON PLASMA COAGULATION/BICAP);  Surgeon: Lear Ng, MD;  Location: [facility];  Service: Endoscopy;  Laterality: N/A;  . 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 Always use your most recent med list.          acetaminophen 325 MG tablet Commonly known as:  TYLENOL Take 2 tablets (650 mg total) by mouth every 6 (six) hours as needed for mild pain (or Fever >/= 101).   albuterol 108 (90 Base) MCG/ACT inhaler Commonly known as:  PROVENTIL HFA;VENTOLIN HFA Inhale 2 puffs into the  lungs every 6 (six) hours as needed for shortness of breath.   alendronate 70 MG tablet Commonly known as:  FOSAMAX Take 70 mg by mouth every Monday. Take with a full glass of water on an empty stomach.   aspirin EC 81 MG tablet Take 81 mg by mouth daily.   budesonide-formoterol 160-4.5 MCG/ACT inhaler Commonly known as:  SYMBICORT Inhale 2 puffs into the lungs 2 (two) times daily.   busPIRone 10 MG tablet Commonly known as:  BUSPAR Take 10 mg by mouth 3 (three) times daily.   carvedilol 3.125 MG tablet Commonly known as:  COREG Take 1 tablet (3.125 mg total) by mouth 2 (two) times daily with a meal.    dicyclomine 10 MG capsule Commonly known as:  BENTYL Take 10 mg by mouth. Take one capsule three times daily for IBS   feeding supplement (ENSURE ENLIVE) Liqd Take 237 mLs by mouth. Drink once daily due to weight loss   ipratropium-albuterol 0.5-2.5 (3) MG/3ML Soln Commonly known as:  DUONEB Take 3 mLs by nebulization every 4 (four) hours as needed.   isosorbide mononitrate 30 MG 24 hr tablet Commonly known as:  IMDUR Take 30 mg by mouth daily.   levothyroxine 25 MCG tablet Commonly known as:  SYNTHROID, LEVOTHROID Take 25 mcg by mouth daily before breakfast.   loratadine 10 MG tablet Commonly known as:  CLARITIN Take 10 mg by mouth daily.   meclizine 12.5 MG tablet Commonly known as:  ANTIVERT Take 12.5 mg by mouth. Take one tablet three times a day as needed for dizziness   mirtazapine 15 MG tablet Commonly known as:  REMERON Take 7.5 mg by mouth at bedtime. 1/2 TAB   nitroGLYCERIN 0.4 MG SL tablet Commonly known as:  NITROSTAT Place 0.4 mg under the tongue every 5 (five) minutes as needed for chest pain.   omeprazole 40 MG capsule Commonly known as:  PRILOSEC Take 20 mg by mouth daily.   polyethylene glycol packet Commonly known as:  MIRALAX / GLYCOLAX Take 17 g by mouth daily as needed for mild constipation.   QUEtiapine 100 MG tablet Commonly known as:  SEROQUEL Take 100 mg by mouth 2 (two) times daily.   QUEtiapine 50 MG tablet Commonly known as:  SEROQUEL Take 50 mg by mouth at bedtime.   Rivaroxaban 15 MG Tabs tablet Commonly known as:  XARELTO Take 15 mg by mouth daily.   sertraline 50 MG tablet Commonly known as:  ZOLOFT Take 50 mg by mouth at bedtime.   simvastatin 10 MG tablet Commonly known as:  ZOCOR Take 10 mg by mouth at bedtime.   tiotropium 18 MCG inhalation capsule Commonly known as:  SPIRIVA Place 18 mcg into inhaler and inhale daily.   traZODone 150 MG tablet Commonly known as:  DESYREL Take 150 mg by mouth. Take one 150 mg  tablet with 25 mg tablet to equal 175 mg at bedtime   traZODone 50 MG tablet Commonly known as:  DESYREL Take 50 mg by mouth. Take 1/2 tablet (25 mg) along with 150 mg tablet to equal 175 mg at bedtime   Vitamin D3 1000 units Caps Take 1 capsule by mouth daily.       No orders of the defined types were placed in this encounter.   Immunization History  Administered Date(s) Administered  . Influenza-Unspecified 08/26/2016, 10/21/2017  . PPD Test 08/16/2016  . Pneumococcal Polysaccharide-23 01/08/2012    Social History   Tobacco Use  . Smoking status:  Former Smoker    Last attempt to quit: 04/09/2003    Years since quitting: 14.6  . Smokeless tobacco: Never Used  Substance Use Topics  . Alcohol use: No    Review of Systems  DATA OBTAINED: from patient, nurse GENERAL:  no fevers, fatigue, appetite changes SKIN: No itching, rash HEENT: No complaint RESPIRATORY: No cough, wheezing, SOB CARDIAC: No chest pain, palpitations, lower extremity edema  GI: No abdominal pain, No N/V/D or constipation, No heartburn or reflux  GU: No dysuria, frequency or urgency, or incontinence  MUSCULOSKELETAL: No unrelieved bone/joint pain NEUROLOGIC: No headache, dizziness  PSYCHIATRIC: No overt anxiety or sadness  Vitals:   10/27/17 1514  BP: (!) 128/56  Pulse: 95  Resp: 18  Temp: 98.4 F (36.9 C)  SpO2: 95%   Body mass index is 29.1 kg/m. Physical Exam  GENERAL APPEARANCE: Alert, conversant, No acute distress  SKIN: No diaphoresis rash HEENT: Unremarkable RESPIRATORY: Breathing is even, unlabored. Lung sounds are clear   CARDIOVASCULAR: Heart RRR no murmurs, rubs or gallops. No peripheral edema  GASTROINTESTINAL: Abdomen is soft, non-tender, not distended w/ normal bowel sounds.  GENITOURINARY: Bladder non tender, not distended  MUSCULOSKELETAL: No abnormal joints or musculature NEUROLOGIC: Cranial nerves 2-12 grossly intact. Moves all extremities PSYCHIATRIC: Mood and  affect with paranoia  Patient Active Problem List   Diagnosis Date Noted  . Urinary tract infection due to extended-spectrum beta lactamase (ESBL) producing Escherichia coli 11/17/2017  . Dysphagia, oropharyngeal 11/15/2017  . Altered mental status 11/10/2017  . Acute metabolic encephalopathy 40/08/2724  . Mania ([place]) 06/21/2017  . Hypomagnesemia 06/19/2017  . Essential hypertension 06/18/2017  . Acute encephalopathy 06/17/2017  . Acute lower UTI 06/17/2017  . E. coli UTI 06/17/2017  . Depression 04/24/2017  . Hypothyroidism 10/03/2016  . Paranoia ([place]) 10/03/2016  . LOC (loss of consciousness) ([place]) 09/02/2016  . 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 Dementia with behavioral disturbance 09/02/2016  . Asthma with acute exacerbation 08/21/2016  . Atrial fibrillation with RVR ([place]) 08/21/2016  . Elevated INR 08/21/2016  . Acute on chronic systolic CHF (congestive heart failure) ([place]) 08/21/2016  . Hyperlipidemia 08/21/2016  . Electrolyte abnormality 08/21/2016  . HCAP (healthcare-associated pneumonia) 08/10/2016  . Sepsis ([place]) 08/09/2016  . Coronary atherosclerosis of native coronary artery 01/21/2014  . Obesity 01/17/2014  . Abdominal pain, generalized 10/18/2012  . GERD (gastroesophageal reflux disease)   . Mood disorder ([place])   . Anxiety   . 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[place])   . Stroke ([place])   . Chest pain 01/07/2012  . Asthma 01/07/2012  . A-fib ([place]) 01/07/2012  . 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 Anemia 01/07/2012  . ANEMIA 07/29/2010  . BARRETTS ESOPHAGUS 06/04/2010    CMP     Component Value Date/Time   NA 143 11/09/2017 0822   NA 138 04/08/2017   K 3.6 11/09/2017 0822   CL 112 (H) 11/09/2017 0822   CO2 23 11/09/2017 0822   GLUCOSE 109 (H) 11/09/2017 0822   BUN 16 11/09/2017 0822   BUN 41 (A) 04/08/2017   CREATININE 0.86 11/09/2017 0822   CALCIUM 9.5 11/09/2017 0822   PROT 6.9 11/08/2017  1512   ALBUMIN 3.8 11/08/2017 1512   AST 18 11/08/2017 1512   ALT 20 11/08/2017 1512   ALKPHOS 64 11/08/2017 1512   BILITOT 0.9 11/08/2017 1512   GFRNONAA >60 11/09/2017 0822   GFRAA >60 11/09/2017 0822   Recent Labs  06/18/17 0346 06/19/17 0431 11/08/17 1512 11/09/17 0822  NA 140 138 141 143  K 3.7 3.5 3.7 3.6  CL 107 107 105 112*  CO2 24 24 26 23   GLUCOSE 100* 102* 150* 109*  BUN 9 10 20 16   CREATININE 0.94 0.94 0.94 0.86  CALCIUM 8.9 8.9 9.9 9.5  MG 1.7 1.6*  --   --   PHOS 3.4 3.0  --   --    Recent Labs    06/18/17 0346 06/19/17 0431 11/08/17 1512  AST 14* 16 18  ALT 10* 14 20  ALKPHOS 52 53 64  BILITOT 0.5 0.7 0.9  PROT 5.6* 5.7* 6.9  ALBUMIN 3.1* 3.1* 3.8   Recent Labs    06/18/17 0346 06/19/17 0431 11/08/17 1512 11/09/17 0822  WBC 5.6 6.5 7.0 6.4  NEUTROABS 3.6 4.2 5.4  --   HGB 11.8* 12.2 13.8 12.8  HCT 36.0 37.3 42.8 40.0  MCV 84.7 84.8 87.2 87.9  PLT 153 147* 233 209   Recent Labs    06/19/17 0433  CHOL 109  LDLCALC 37  TRIG 68   No results found for: [facility] Lab Results  Component Value Date   TSH 1.680 11/08/2017   Lab Results  Component Value Date   HGBA1C 5.4 04/08/2017   Lab Results  Component Value Date   CHOL 109 06/19/2017   HDL 58 06/19/2017   LDLCALC 37 06/19/2017   TRIG 68 06/19/2017   CHOLHDL 1.9 06/19/2017    Significant Diagnostic Results in last 30 days:  Ct Head Wo Contrast  Result Date: 11/08/2017 CLINICAL DATA:  Altered level of consciousness. EXAM: CT HEAD WITHOUT CONTRAST TECHNIQUE: Contiguous axial images were obtained from the base of the skull through the vertex without intravenous contrast. COMPARISON:  CT scan of August 25, 2016. FINDINGS: Brain: Old left frontal and right posterior parietal infarctions are noted. No mass effect or midline shift is noted. Ventricular size is within normal limits. There is no evidence of mass lesion, hemorrhage or acute infarction. Vascular: No hyperdense vessel  or unexpected calcification. Skull: No fracture or other bony abnormality is noted. Fluid is noted in right mastoid air cells. Sinuses/Orbits: No acute finding. Other: None. IMPRESSION: Old left frontal and right posterior parietal infarctions. No acute intracranial abnormality seen. Electronically Signed   By: Marijo Conception, M.D.   On: 11/08/2017 16:57   Mr Brain Wo Contrast  Result Date: 11/09/2017 CLINICAL DATA:  76 y/o female with increasing confusion, agitation, hallucination. Chronic right ICA occlusion. EXAM: MRI HEAD WITHOUT CONTRAST TECHNIQUE: Multiplanar, multiecho pulse sequences of the brain and surrounding structures were obtained without intravenous contrast. COMPARISON:  CT head without contrast 11/08/2017, CTA neck 08/26/2016, and earlier. FINDINGS: Brain: Chronic infarcts with encephalomalacia in the left superior frontal gyrus, right parietal lobe. Mild associated hemosiderin. Superimposed Patchy and confluent bilateral cerebral white matter T2 and FLAIR hyperintensity. Mild for age T2 heterogeneity in the bilateral deep gray matter nuclei. T2 hyperintensity in the pons greater on the right might reflect Wallerian degeneration. Cerebellum is within normal limits. No convincing restricted diffusion to suggest acute infarction. No midline shift, mass effect, evidence of mass lesion, ventriculomegaly, extra-axial collection or acute intracranial hemorrhage. Cervicomedullary junction and pituitary are within normal limits. Vascular: Loss of the right ICA flow void in the upper neck and siphon. Evidence of reconstituted flow at the right ICA terminus, probably via a right posterior communicating artery. Other Major intracranial vascular flow voids are preserved with mild generalized intracranial  artery tortuosity. Skull and upper cervical spine: Negative visualized cervical spine. Visualized bone marrow signal is within normal limits. Sinuses/Orbits: Postoperative changes to both globes,  otherwise negative orbits soft tissues. Paranasal sinuses are clear. Other: Chronic right mastoid effusion. Trace left mastoid fluid also appears chronic. Negative nasopharynx. Visible internal auditory structures appear normal. Scalp and face soft tissues appear negative. IMPRESSION: 1.  No acute intracranial abnormality. 2. Chronic right carotid occlusion. Chronic moderate-size cortical infarcts in the left frontal and right parietal lobes. 3. Moderately advanced for age bilateral cerebral white matter signal changes, most commonly due to chronic small vessel disease. Possible Wallerian degeneration in the right brainstem. 4. Chronic right mastoid effusion. Electronically Signed   By: Genevie Ann M.D.   On: 11/09/2017 08:16   Dg Chest Port 1 View  Result Date: 11/09/2017 CLINICAL DATA:  Acute onset of confusion.  Encephalopathy. EXAM: PORTABLE CHEST 1 VIEW COMPARISON:  Chest radiograph performed 08/25/2016 FINDINGS: The lungs are well-aerated and clear. There is no evidence of focal opacification, pleural effusion or pneumothorax. The cardiomediastinal silhouette is within normal limits. No acute osseous abnormalities are seen. There is chronic deformity of the proximal right humerus. IMPRESSION: No acute cardiopulmonary process seen. Electronically Signed   By: Garald Balding M.D.   On: 11/09/2017 06:46    Assessment and Plan  Dementia with behavioral disturbance Chronic and stable; continue supportive care  GERD (gastroesophageal reflux disease) No reported reflux; continue omeprazole 40 mg by mouth daily  Hypothyroidism TSH stable; continue Synthroid 25 g by mouth daily    Kort Stettler D. [name], MD

## 2017-10-31 ENCOUNTER — Encounter: Payer: Self-pay | Admitting: Internal Medicine

## 2017-10-31 ENCOUNTER — Non-Acute Institutional Stay (SKILLED_NURSING_FACILITY): Payer: Medicare Other | Admitting: Internal Medicine

## 2017-10-31 DIAGNOSIS — F22 Delusional disorders: Secondary | ICD-10-CM

## 2017-10-31 NOTE — Progress Notes (Signed)
Location:  [place] Room Number: [place] of Service:  SNF ([phone])  [name], MD  Patient Care Team: [name], MD as PCP - General (Internal Medicine)  Extended Emergency Contact Information Primary Emergency Contact: Tyner,[name] Address: [address] [place], [place] [address] [name] of [place] Phone: [phone] Relation: Daughter Secondary Emergency Contact: Drue Novel [place] of [place] Phone: [phone] Relation: Niece    Allergies: Patient has no known allergies.  Chief Complaint  Patient presents with  . Acute Visit    paranoia    HPI: Patient is 76 y.o. female who nursing asked me to see because patient is much much more paranoid and then even several days ago. She is reported over the weekend she took stool and spread all over herself and all over her close; she took all of her clothes out of her closet not on to the floor. When I speak to her she tells me that people are after her and that they're trying to kill the baby. She does not have any homicidal ideation.  Past Medical History:  Diagnosis Date  . A-fib ([place]) 01/07/2012  . ANEMIA 07/29/2010   Qualifier: Diagnosis of  By: Bobby Rumpf CMA (AAMA), [place]    . Angina pectoris ([place])   . Anxiety   . Asthma   . Asthma 01/07/2012  . Barrett's esophagus   . Bronchitis   . Cancer ([place])    uterian  . Chest pain   . Coronary artery disease    right carotid occlusion   . Dementia with behavioral disturbance 09/02/2016  . Depression   . Dysrhythmia    atrial fibrillation  . GERD (gastroesophageal reflux disease)   . 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 Hyperlipidemia   . Hypertension   . Mood disorder ([place])   . Paranoia ([place]) 10/03/2016  . Recurrent upper respiratory infection (URI)   . Stroke [facility])    2007, after left endarterectomy    Past Surgical History:  Procedure Laterality Date  . ABDOMINAL HYSTERECTOMY      . BALLOON DILATION  10/18/2012   Procedure: BALLOON DILATION;  Surgeon: Lear Ng, MD;  Location: [facility] ENDOSCOPY;  Service: Endoscopy;  Laterality: N/A;  . CARDIAC CATHETERIZATION    . CAROTID ENDARTERECTOMY     left, complicated by stroke  . CHOLECYSTECTOMY     incidental at time of colectomy  . COLONOSCOPY  12/01/2011   Procedure: COLONOSCOPY;  Surgeon: Lear Ng, MD;  Location: [facility] ENDOSCOPY;  Service: Endoscopy;  Laterality: N/A;  . COLONOSCOPY  10/18/2012   Procedure: COLONOSCOPY;  Surgeon: Lear Ng, MD;  Location: [facility] ENDOSCOPY;  Service: Endoscopy;  Laterality: N/A;  colonic dilation  . ESOPHAGOGASTRODUODENOSCOPY  12/01/2011   Procedure: ESOPHAGOGASTRODUODENOSCOPY (EGD);  Surgeon: Lear Ng, MD;  Location: [facility];  Service: Endoscopy;  Laterality: N/A;  . HOT HEMOSTASIS  12/01/2011   Procedure: HOT HEMOSTASIS (ARGON PLASMA COAGULATION/BICAP);  Surgeon: Lear Ng, MD;  Location: [facility];  Service: Endoscopy;  Laterality: N/A;  . 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 Always use your most recent med list.  acetaminophen 325 MG tablet Commonly known as:  TYLENOL Take 2 tablets (650 mg total) by mouth every 6 (six) hours as needed for mild pain (or Fever >/= 101).   albuterol 108 (90 Base) MCG/ACT inhaler Commonly known as:  PROVENTIL HFA;VENTOLIN HFA Inhale 2 puffs into the lungs every 6 (six) hours as needed for shortness of breath.   alendronate 70 MG tablet Commonly known as:  FOSAMAX Take 70 mg by mouth every Monday. Take with a full glass of water on an empty stomach.   aspirin EC 81 MG tablet Take 81 mg by mouth daily.   budesonide-formoterol 160-4.5 MCG/ACT inhaler Commonly known as:  SYMBICORT Inhale 2 puffs into the lungs 2 (two) times daily.    busPIRone 10 MG tablet Commonly known as:  BUSPAR Take 10 mg by mouth 3 (three) times daily.   carvedilol 3.125 MG tablet Commonly known as:  COREG Take 1 tablet (3.125 mg total) by mouth 2 (two) times daily with a meal.   dicyclomine 10 MG capsule Commonly known as:  BENTYL Take 10 mg by mouth. Take one capsule three times daily for IBS   feeding supplement (ENSURE ENLIVE) Liqd Take 237 mLs by mouth. Drink once daily due to weight loss   ipratropium-albuterol 0.5-2.5 (3) MG/3ML Soln Commonly known as:  DUONEB Take 3 mLs by nebulization every 4 (four) hours as needed.   isosorbide mononitrate 30 MG 24 hr tablet Commonly known as:  IMDUR Take 30 mg by mouth daily.   levothyroxine 25 MCG tablet Commonly known as:  SYNTHROID, LEVOTHROID Take 25 mcg by mouth daily before breakfast.   loratadine 10 MG tablet Commonly known as:  CLARITIN Take 10 mg by mouth daily.   meclizine 12.5 MG tablet Commonly known as:  ANTIVERT Take 12.5 mg by mouth. Take one tablet three times a day as needed for dizziness   mirtazapine 15 MG tablet Commonly known as:  REMERON Take 7.5 mg by mouth at bedtime. 1/2 TAB   nitroGLYCERIN 0.4 MG SL tablet Commonly known as:  NITROSTAT Place 0.4 mg under the tongue every 5 (five) minutes as needed for chest pain.   omeprazole 40 MG capsule Commonly known as:  PRILOSEC Take 20 mg by mouth daily.   polyethylene glycol packet Commonly known as:  MIRALAX / GLYCOLAX Take 17 g by mouth daily as needed.   QUEtiapine 100 MG tablet Commonly known as:  SEROQUEL Take 100 mg by mouth daily.   QUEtiapine 50 MG tablet Commonly known as:  SEROQUEL Take 50 mg by mouth at bedtime.   Rivaroxaban 15 MG Tabs tablet Commonly known as:  XARELTO Take 15 mg by mouth daily.   sertraline 50 MG tablet Commonly known as:  ZOLOFT Take 50 mg by mouth at bedtime.   simvastatin 10 MG tablet Commonly known as:  ZOCOR Take 10 mg by mouth at bedtime.   tiotropium 18  MCG inhalation capsule Commonly known as:  SPIRIVA Place 18 mcg into inhaler and inhale daily.   traZODone 150 MG tablet Commonly known as:  DESYREL Take 150 mg by mouth. Take one 150 mg tablet with 25 mg tablet to equal 175 mg at bedtime   traZODone 50 MG tablet Commonly known as:  DESYREL Take 50 mg by mouth. Take 1/2 tablet (25 mg) along with 150 mg tablet to equal 175 mg at bedtime   Vitamin D3 1000 units Caps Take 1 capsule by mouth daily.       No orders of  the defined types were placed in this encounter.   Immunization History  Administered Date(s) Administered  . Influenza-Unspecified 08/26/2016, 10/21/2017  . PPD Test 08/16/2016  . Pneumococcal Polysaccharide-23 01/08/2012    Social History   Tobacco Use  . Smoking status: Former Smoker    Last attempt to quit: 04/09/2003    Years since quitting: 14.5  . Smokeless tobacco: Never Used  Substance Use Topics  . Alcohol use: No    Review of Systems  DATA OBTAINED: from patient, nurse-both as per history of present illness GENERAL:  no fevers, fatigue, appetite changes SKIN: No itching, rash HEENT: No complaint RESPIRATORY: No cough, wheezing, SOB CARDIAC: No chest pain, palpitations, lower extremity edema  GI: No abdominal pain, No N/V/D or constipation, No heartburn or reflux  GU: No dysuria, frequency or urgency, or incontinence  MUSCULOSKELETAL: No unrelieved bone/joint pain NEUROLOGIC: No headache, dizziness  PSYCHIATRIC: P[place] Plate paranoia  Vitals:   10/31/17 2256  BP: (!) 128/56  Pulse: 95  Resp: 18  Temp: 98.4 F (36.9 C)  SpO2: 95%   Body mass index is 29.26 kg/m. Physical Exam  GENERAL APPEARANCE: Alert, conversant, paranoid SKIN: No diaphoresis rash HEENT: Unremarkable RESPIRATORY: Breathing is even, unlabored. Lung sounds are clear   CARDIOVASCULAR: Heart RRR no murmurs, rubs or gallops. No peripheral edema  GASTROINTESTINAL: Abdomen is soft, non-tender, not distended w/ normal bowel  sounds.  GENITOURINARY: Bladder non tender, not distended  MUSCULOSKELETAL: No abnormal joints or musculature NEUROLOGIC: Cranial nerves 2-12 grossly intact. Moves all extremities PSYCHIATRIC: Mood and affect P[place] Plate paranoia  Patient Active Problem List   Diagnosis Date Noted  . Mania ([place]) 06/21/2017  . Hypomagnesemia 06/19/2017  . Essential hypertension 06/18/2017  . Acute encephalopathy 06/17/2017  . Acute lower UTI 06/17/2017  . E. coli UTI 06/17/2017  . Depression 04/24/2017  . Hypothyroidism 10/03/2016  . Paranoia ([place]) 10/03/2016  . LOC (loss of consciousness) ([place]) 09/02/2016  . 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 Dementia with behavioral disturbance 09/02/2016  . Asthma with acute exacerbation 08/21/2016  . Atrial fibrillation with RVR ([place]) 08/21/2016  . Elevated INR 08/21/2016  . Acute on chronic systolic CHF (congestive heart failure) ([place]) 08/21/2016  . Hyperlipidemia 08/21/2016  . Electrolyte abnormality 08/21/2016  . HCAP (healthcare-associated pneumonia) 08/10/2016  . Sepsis ([place]) 08/09/2016  . Coronary atherosclerosis of native coronary artery 01/21/2014  . Obesity 01/17/2014  . Abdominal pain, generalized 10/18/2012  . GERD (gastroesophageal reflux disease)   . Mood disorder ([place])   . Anxiety   . 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[place])   . Stroke ([place])   . Chest pain 01/07/2012  . Asthma 01/07/2012  . A-fib ([place]) 01/07/2012  . 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 Anemia 01/07/2012  . ANEMIA 07/29/2010  . BARRETTS ESOPHAGUS 06/04/2010    CMP     Component Value Date/Time   NA 138 06/19/2017 0431   NA 138 04/08/2017   K 3.5 06/19/2017 0431   CL 107 06/19/2017 0431   CO2 24 06/19/2017 0431   GLUCOSE 102 (H) 06/19/2017 0431   BUN 10 06/19/2017 0431   BUN 41 (A) 04/08/2017   CREATININE 0.94 06/19/2017 0431   CALCIUM 8.9 06/19/2017 0431   PROT 5.7 (L) 06/19/2017 0431   ALBUMIN 3.1 (L)  06/19/2017 0431   AST 16 06/19/2017 0431   ALT 14 06/19/2017 0431   ALKPHOS 53 06/19/2017 0431   BILITOT 0.7 06/19/2017 0431   GFRNONAA 57 (  L) 06/19/2017 0431   GFRAA >60 06/19/2017 0431   Recent Labs    06/17/17 0441 06/18/17 0346 06/19/17 0431  NA 138 140 138  K 3.7 3.7 3.5  CL 105 107 107  CO2 24 24 24   GLUCOSE 89 100* 102*  BUN 13 9 10   CREATININE 0.72 0.94 0.94  CALCIUM 9.0 8.9 8.9  MG  --  1.7 1.6*  PHOS  --  3.4 3.0   Recent Labs    06/18/17 0346 06/19/17 0431  AST 14* 16  ALT 10* 14  ALKPHOS 52 53  BILITOT 0.5 0.7  PROT 5.6* 5.7*  ALBUMIN 3.1* 3.1*   Recent Labs    06/17/17 0441 06/18/17 0346 06/19/17 0431  WBC 6.7 5.6 6.5  NEUTROABS  --  3.6 4.2  HGB 12.9 11.8* 12.2  HCT 38.1 36.0 37.3  MCV 83.2 84.7 84.8  PLT 129* 153 147*   Recent Labs    06/19/17 0433  CHOL 109  LDLCALC 37  TRIG 68   No results found for: [facility] Lab Results  Component Value Date   TSH 4.09 04/08/2017   Lab Results  Component Value Date   HGBA1C 5.4 04/08/2017   Lab Results  Component Value Date   CHOL 109 06/19/2017   HDL 58 06/19/2017   LDLCALC 37 06/19/2017   TRIG 68 06/19/2017   CHOLHDL 1.9 06/19/2017    Significant Diagnostic Results in last 30 days:  No results found.  Assessment and Plan  Acute paranoia-this was not much I can do today; will increase her Seroquel 200 mg daily at bedtime up from 50; patient is already on hefty doses of trazodone Zoloft and BuSpar. He was sent patient to the ED they said she's had a UTI and that is why she is so paranoid and they sent her back, so today we will order UA with C&S to see if we can preempt that and if she has a UTI we will treated and then send her to the ED    Webb Silversmith D. [name], MD

## 2017-11-08 ENCOUNTER — Emergency Department (HOSPITAL_COMMUNITY): Payer: Medicare Other

## 2017-11-08 ENCOUNTER — Other Ambulatory Visit: Payer: Self-pay

## 2017-11-08 ENCOUNTER — Inpatient Hospital Stay (HOSPITAL_COMMUNITY)
Admission: EM | Admit: 2017-11-08 | Discharge: 2017-11-11 | DRG: 071 | Disposition: A | Discharge status: Skilled Nursing Facility | Payer: Medicare Other | Attending: Internal Medicine | Admitting: Internal Medicine

## 2017-11-08 ENCOUNTER — Encounter (HOSPITAL_COMMUNITY): Payer: Self-pay | Admitting: *Deleted

## 2017-11-08 DIAGNOSIS — K2270 Barrett's esophagus without dysplasia: Secondary | ICD-10-CM | POA: Diagnosis present

## 2017-11-08 DIAGNOSIS — N3000 Acute cystitis without hematuria: Secondary | ICD-10-CM | POA: Diagnosis present

## 2017-11-08 DIAGNOSIS — Z7901 Long term (current) use of anticoagulants: Secondary | ICD-10-CM

## 2017-11-08 DIAGNOSIS — N390 Urinary tract infection, site not specified: Secondary | ICD-10-CM | POA: Diagnosis present

## 2017-11-08 DIAGNOSIS — I4891 Unspecified atrial fibrillation: Secondary | ICD-10-CM | POA: Diagnosis present

## 2017-11-08 DIAGNOSIS — J45909 Unspecified asthma, uncomplicated: Secondary | ICD-10-CM | POA: Diagnosis present

## 2017-11-08 DIAGNOSIS — F03918 Unspecified dementia, unspecified severity, with other behavioral disturbance: Secondary | ICD-10-CM | POA: Diagnosis present

## 2017-11-08 DIAGNOSIS — F05 Delirium due to known physiological condition: Secondary | ICD-10-CM | POA: Diagnosis present

## 2017-11-08 DIAGNOSIS — G9341 Metabolic encephalopathy: Secondary | ICD-10-CM | POA: Diagnosis not present

## 2017-11-08 DIAGNOSIS — Z66 Do not resuscitate: Secondary | ICD-10-CM | POA: Diagnosis present

## 2017-11-08 DIAGNOSIS — F0391 Unspecified dementia with behavioral disturbance: Secondary | ICD-10-CM | POA: Diagnosis present

## 2017-11-08 DIAGNOSIS — R4182 Altered mental status, unspecified: Secondary | ICD-10-CM

## 2017-11-08 DIAGNOSIS — I10 Essential (primary) hypertension: Secondary | ICD-10-CM | POA: Diagnosis present

## 2017-11-08 DIAGNOSIS — Z87891 Personal history of nicotine dependence: Secondary | ICD-10-CM

## 2017-11-08 DIAGNOSIS — B9620 Unspecified Escherichia coli [E. coli] as the cause of diseases classified elsewhere: Secondary | ICD-10-CM | POA: Diagnosis present

## 2017-11-08 DIAGNOSIS — F419 Anxiety disorder, unspecified: Secondary | ICD-10-CM | POA: Diagnosis present

## 2017-11-08 DIAGNOSIS — Z79899 Other long term (current) drug therapy: Secondary | ICD-10-CM

## 2017-11-08 DIAGNOSIS — Z79890 Hormone replacement therapy (postmenopausal): Secondary | ICD-10-CM

## 2017-11-08 DIAGNOSIS — I482 Chronic atrial fibrillation: Secondary | ICD-10-CM | POA: Diagnosis present

## 2017-11-08 DIAGNOSIS — R410 Disorientation, unspecified: Secondary | ICD-10-CM

## 2017-11-08 DIAGNOSIS — Z7982 Long term (current) use of aspirin: Secondary | ICD-10-CM

## 2017-11-08 DIAGNOSIS — I6521 Occlusion and stenosis of right carotid artery: Secondary | ICD-10-CM | POA: Diagnosis present

## 2017-11-08 DIAGNOSIS — D649 Anemia, unspecified: Secondary | ICD-10-CM | POA: Diagnosis present

## 2017-11-08 DIAGNOSIS — G9340 Encephalopathy, unspecified: Secondary | ICD-10-CM

## 2017-11-08 DIAGNOSIS — I69391 Dysphagia following cerebral infarction: Secondary | ICD-10-CM

## 2017-11-08 DIAGNOSIS — R1312 Dysphagia, oropharyngeal phase: Secondary | ICD-10-CM

## 2017-11-08 DIAGNOSIS — E039 Hypothyroidism, unspecified: Secondary | ICD-10-CM | POA: Diagnosis present

## 2017-11-08 DIAGNOSIS — Z1612 Extended spectrum beta lactamase (ESBL) resistance: Secondary | ICD-10-CM | POA: Diagnosis present

## 2017-11-08 DIAGNOSIS — E785 Hyperlipidemia, unspecified: Secondary | ICD-10-CM | POA: Diagnosis present

## 2017-11-08 DIAGNOSIS — F329 Major depressive disorder, single episode, unspecified: Secondary | ICD-10-CM | POA: Diagnosis present

## 2017-11-08 DIAGNOSIS — K219 Gastro-esophageal reflux disease without esophagitis: Secondary | ICD-10-CM | POA: Diagnosis present

## 2017-11-08 DIAGNOSIS — I2510 Atherosclerotic heart disease of native coronary artery without angina pectoris: Secondary | ICD-10-CM | POA: Diagnosis present

## 2017-11-08 DIAGNOSIS — Z7983 Long term (current) use of bisphosphonates: Secondary | ICD-10-CM

## 2017-11-08 DIAGNOSIS — F39 Unspecified mood [affective] disorder: Secondary | ICD-10-CM | POA: Diagnosis present

## 2017-11-08 DIAGNOSIS — Z7951 Long term (current) use of inhaled steroids: Secondary | ICD-10-CM

## 2017-11-08 LAB — COMPREHENSIVE METABOLIC PANEL
ALBUMIN: 3.8 g/dL (ref 3.5–5.0)
ALK PHOS: 64 U/L (ref 38–126)
ALT: 20 U/L (ref 14–54)
ANION GAP: 10 (ref 5–15)
AST: 18 U/L (ref 15–41)
BUN: 20 mg/dL (ref 6–20)
CHLORIDE: 105 mmol/L (ref 101–111)
CO2: 26 mmol/L (ref 22–32)
Calcium: 9.9 mg/dL (ref 8.9–10.3)
Creatinine, Ser: 0.94 mg/dL (ref 0.44–1.00)
GFR calc non Af Amer: 57 mL/min — ABNORMAL LOW (ref >60)
GLUCOSE: 150 mg/dL — AB (ref 65–99)
POTASSIUM: 3.7 mmol/L (ref 3.5–5.1)
SODIUM: 141 mmol/L (ref 135–145)
Total Bilirubin: 0.9 mg/dL (ref 0.3–1.2)
Total Protein: 6.9 g/dL (ref 6.5–8.1)

## 2017-11-08 LAB — URINALYSIS, ROUTINE W REFLEX MICROSCOPIC
BILIRUBIN URINE: NEGATIVE
Glucose, UA: NEGATIVE mg/dL
KETONES UR: NEGATIVE mg/dL
NITRITE: NEGATIVE
PROTEIN: 100 mg/dL — AB
Specific Gravity, Urine: 1.02 (ref 1.005–1.030)
pH: 5 (ref 5.0–8.0)

## 2017-11-08 LAB — CBC WITH DIFFERENTIAL/PLATELET
BASOS PCT: 0 %
Basophils Absolute: 0 10*3/uL (ref 0.0–0.1)
EOS ABS: 0.1 10*3/uL (ref 0.0–0.7)
EOS PCT: 1 %
HCT: 42.8 % (ref 36.0–46.0)
HEMOGLOBIN: 13.8 g/dL (ref 12.0–15.0)
Lymphocytes Relative: 16 %
Lymphs Abs: 1.2 10*3/uL (ref 0.7–4.0)
MCH: 28.1 pg (ref 26.0–34.0)
MCHC: 32.2 g/dL (ref 30.0–36.0)
MCV: 87.2 fL (ref 78.0–100.0)
MONOS PCT: 6 %
Monocytes Absolute: 0.4 10*3/uL (ref 0.1–1.0)
NEUTROS PCT: 77 %
Neutro Abs: 5.4 10*3/uL (ref 1.7–7.7)
PLATELETS: 233 10*3/uL (ref 150–400)
RBC: 4.91 MIL/uL (ref 3.87–5.11)
RDW: 13.8 % (ref 11.5–15.5)
WBC: 7 10*3/uL (ref 4.0–10.5)

## 2017-11-08 LAB — RAPID URINE DRUG SCREEN, HOSP PERFORMED
Amphetamines: NOT DETECTED
BARBITURATES: NOT DETECTED
Benzodiazepines: NOT DETECTED
COCAINE: NOT DETECTED
OPIATES: NOT DETECTED
TETRAHYDROCANNABINOL: NOT DETECTED

## 2017-11-08 LAB — TSH: TSH: 1.68 u[IU]/mL (ref 0.350–4.500)

## 2017-11-08 MED ORDER — LORATADINE 10 MG PO TABS
10.0000 mg | ORAL_TABLET | Freq: Every day | ORAL | Status: DC
  Administered 2017-11-10 – 2017-11-11 (×2): 10 mg via ORAL
  Filled 2017-11-08 (×2): qty 1

## 2017-11-08 MED ORDER — DEXTROSE 5 % IV SOLN
1.0000 g | Freq: Once | INTRAVENOUS | Status: AC
  Administered 2017-11-08: 1 g via INTRAVENOUS
  Filled 2017-11-08 (×2): qty 10

## 2017-11-08 MED ORDER — QUETIAPINE FUMARATE 100 MG PO TABS
100.0000 mg | ORAL_TABLET | Freq: Every day | ORAL | Status: DC
  Administered 2017-11-10: 100 mg via ORAL
  Filled 2017-11-08: qty 1

## 2017-11-08 MED ORDER — ACETAMINOPHEN 650 MG RE SUPP: 650.0000 mg | Freq: Four times a day (QID) | RECTAL | Status: DC | PRN

## 2017-11-08 MED ORDER — ISOSORBIDE MONONITRATE ER 30 MG PO TB24
30.0000 mg | ORAL_TABLET | Freq: Every day | ORAL | Status: DC
  Administered 2017-11-10 – 2017-11-11 (×2): 30 mg via ORAL
  Filled 2017-11-08 (×2): qty 1

## 2017-11-08 MED ORDER — MIRTAZAPINE 7.5 MG PO TABS
7.5000 mg | ORAL_TABLET | Freq: Every day | ORAL | Status: DC
  Administered 2017-11-08 – 2017-11-10 (×3): 7.5 mg via ORAL
  Filled 2017-11-08 (×4): qty 1

## 2017-11-08 MED ORDER — TRAZODONE HCL 50 MG PO TABS
150.0000 mg | ORAL_TABLET | Freq: Every day | ORAL | Status: DC
  Administered 2017-11-08 – 2017-11-10 (×3): 150 mg via ORAL
  Filled 2017-11-08 (×2): qty 1

## 2017-11-08 MED ORDER — QUETIAPINE FUMARATE 50 MG PO TABS
50.0000 mg | ORAL_TABLET | Freq: Every day | ORAL | Status: DC
  Administered 2017-11-08 – 2017-11-10 (×3): 50 mg via ORAL
  Filled 2017-11-08: qty 2
  Filled 2017-11-08 (×3): qty 1
  Filled 2017-11-08: qty 2

## 2017-11-08 MED ORDER — BUSPIRONE HCL 10 MG PO TABS
10.0000 mg | ORAL_TABLET | Freq: Three times a day (TID) | ORAL | Status: DC
  Administered 2017-11-08 – 2017-11-11 (×7): 10 mg via ORAL
  Filled 2017-11-08 (×7): qty 1

## 2017-11-08 MED ORDER — HYDRALAZINE HCL 20 MG/ML IJ SOLN
5.0000 mg | Freq: Once | INTRAMUSCULAR | Status: AC
  Administered 2017-11-08: 5 mg via INTRAVENOUS
  Filled 2017-11-08: qty 1

## 2017-11-08 MED ORDER — TRAZODONE HCL 50 MG PO TABS
25.0000 mg | ORAL_TABLET | Freq: Every day | ORAL | Status: DC
  Administered 2017-11-08 – 2017-11-10 (×3): 25 mg via ORAL
  Filled 2017-11-08 (×4): qty 1

## 2017-11-08 MED ORDER — ONDANSETRON HCL 4 MG PO TABS: 4.0000 mg | ORAL_TABLET | Freq: Four times a day (QID) | ORAL | Status: DC | PRN

## 2017-11-08 MED ORDER — DEXTROSE 5 % IV SOLN
1.0000 g | INTRAVENOUS | Status: DC
  Administered 2017-11-09: 1 g via INTRAVENOUS
  Filled 2017-11-08 (×2): qty 10

## 2017-11-08 MED ORDER — TIOTROPIUM BROMIDE MONOHYDRATE 18 MCG IN CAPS
18.0000 ug | ORAL_CAPSULE | Freq: Every day | RESPIRATORY_TRACT | Status: DC
  Administered 2017-11-09 – 2017-11-10 (×2): 18 ug via RESPIRATORY_TRACT
  Filled 2017-11-08: qty 5

## 2017-11-08 MED ORDER — CARVEDILOL 3.125 MG PO TABS
3.1250 mg | ORAL_TABLET | Freq: Two times a day (BID) | ORAL | Status: DC
  Administered 2017-11-10 – 2017-11-11 (×4): 3.125 mg via ORAL
  Filled 2017-11-08 (×4): qty 1

## 2017-11-08 MED ORDER — MOMETASONE FURO-FORMOTEROL FUM 200-5 MCG/ACT IN AERO
2.0000 | INHALATION_SPRAY | Freq: Two times a day (BID) | RESPIRATORY_TRACT | Status: DC
  Administered 2017-11-08 – 2017-11-10 (×3): 2 via RESPIRATORY_TRACT
  Filled 2017-11-08: qty 8.8

## 2017-11-08 MED ORDER — ONDANSETRON HCL 4 MG/2ML IJ SOLN
4.0000 mg | Freq: Four times a day (QID) | INTRAMUSCULAR | Status: DC | PRN
Start: 2017-11-08 — End: 2017-11-11

## 2017-11-08 MED ORDER — POLYETHYLENE GLYCOL 3350 17 G PO PACK: 17.0000 g | PACK | Freq: Every day | ORAL | Status: DC | PRN

## 2017-11-08 MED ORDER — RIVAROXABAN 15 MG PO TABS
15.0000 mg | ORAL_TABLET | Freq: Every day | ORAL | Status: DC
  Administered 2017-11-10 – 2017-11-11 (×2): 15 mg via ORAL
  Filled 2017-11-08 (×2): qty 1

## 2017-11-08 MED ORDER — LEVOTHYROXINE SODIUM 25 MCG PO TABS
25.0000 ug | ORAL_TABLET | Freq: Every day | ORAL | Status: DC
  Administered 2017-11-10 – 2017-11-11 (×2): 25 ug via ORAL
  Filled 2017-11-08 (×2): qty 1

## 2017-11-08 MED ORDER — SERTRALINE HCL 50 MG PO TABS
50.0000 mg | ORAL_TABLET | Freq: Every day | ORAL | Status: DC
  Administered 2017-11-08 – 2017-11-10 (×3): 50 mg via ORAL
  Filled 2017-11-08 (×3): qty 1

## 2017-11-08 MED ORDER — IPRATROPIUM-ALBUTEROL 0.5-2.5 (3) MG/3ML IN SOLN: 3.0000 mL | RESPIRATORY_TRACT | Status: DC | PRN

## 2017-11-08 MED ORDER — SIMVASTATIN 10 MG PO TABS
10.0000 mg | ORAL_TABLET | Freq: Every day | ORAL | Status: DC
  Administered 2017-11-08 – 2017-11-10 (×3): 10 mg via ORAL
  Filled 2017-11-08 (×3): qty 1

## 2017-11-08 MED ORDER — NITROGLYCERIN 0.4 MG SL SUBL: 0.4000 mg | SUBLINGUAL_TABLET | SUBLINGUAL | Status: DC | PRN

## 2017-11-08 MED ORDER — ASPIRIN EC 81 MG PO TBEC
81.0000 mg | DELAYED_RELEASE_TABLET | Freq: Every day | ORAL | Status: DC
  Administered 2017-11-10 – 2017-11-11 (×2): 81 mg via ORAL
  Filled 2017-11-08 (×2): qty 1

## 2017-11-08 MED ORDER — VITAMIN D 1000 UNITS PO TABS
1000.0000 [IU] | ORAL_TABLET | Freq: Every day | ORAL | Status: DC
  Administered 2017-11-08 – 2017-11-11 (×3): 1000 [IU] via ORAL
  Filled 2017-11-08 (×3): qty 1

## 2017-11-08 MED ORDER — ACETAMINOPHEN 325 MG PO TABS: 650.0000 mg | ORAL_TABLET | Freq: Four times a day (QID) | ORAL | Status: DC | PRN

## 2017-11-08 MED ORDER — LORAZEPAM 2 MG/ML IJ SOLN
1.0000 mg | Freq: Once | INTRAMUSCULAR | Status: DC
Start: 2017-11-08 — End: 2017-11-08
  Administered 2017-11-08: 1 mg via INTRAVENOUS
  Filled 2017-11-08: qty 1

## 2017-11-08 MED ORDER — ENSURE ENLIVE PO LIQD
237.0000 mL | Freq: Three times a day (TID) | ORAL | Status: DC
  Administered 2017-11-09: 237 mL via ORAL

## 2017-11-08 MED ORDER — DICYCLOMINE HCL 10 MG PO CAPS
10.0000 mg | ORAL_CAPSULE | Freq: Three times a day (TID) | ORAL | Status: DC
  Administered 2017-11-10 – 2017-11-11 (×5): 10 mg via ORAL
  Filled 2017-11-08 (×5): qty 1

## 2017-11-08 MED ORDER — ALBUTEROL SULFATE (2.5 MG/3ML) 0.083% IN NEBU: 3.0000 mL | INHALATION_SOLUTION | Freq: Four times a day (QID) | RESPIRATORY_TRACT | Status: DC | PRN

## 2017-11-08 MED ORDER — PANTOPRAZOLE SODIUM 40 MG PO TBEC
40.0000 mg | DELAYED_RELEASE_TABLET | Freq: Every day | ORAL | Status: DC
  Administered 2017-11-10 – 2017-11-11 (×2): 40 mg via ORAL
  Filled 2017-11-08 (×3): qty 1

## 2017-11-08 NOTE — [facility] Assessment (Signed)
[facility] Assessment Progress Note  Case discussed with [name], NP who recommends the pt be reassessed once she is medically cleared to determine if she is still displaying symptoms of confusion and disorentation. EDP McDonald, [name], PA-C was advised of the recommendation and states the pt is going to be medically admitted due to acute delirium and possible UTI. Pt's nurse [name], RN has been made aware of recommendation.  Lind Covert, MSW, LCSW Therapeutic Triage Specialist  [phone]

## 2017-11-08 NOTE — ED Provider Notes (Signed)
[facility] DEPT Provider Note   CSN: [id number] Arrival date & time: 11/08/17  1308     History   Chief Complaint Chief Complaint  Patient presents with  . Aggressive Behavior    HPI Cindy [name] is a 76 y.o. female.  HPI Cindy [name] is a 76 y.o. female with history of A. fib, asthma, anemia, depression, dementia, presents to emergency department complaining of altered mental status.  Patient is coming from a skilled nursing facility accompanied by her daughter.  Patient's daughter providing the history.  Patient with increased confusion, paranoid behavior, aggressive and combative for the last 2 weeks.  Patient is refusing her medications at the facility.  She is not eating or drinking unless forced.  She is more confused.  Daughter states although patient has had urinary tract infections before with increased confusion, she believes this may be something else going on.  She is asking for psychiatric evaluation.  Patient also possibly had a fall several days ago, was found laying near her bed.  No obvious signs of trauma.  Patient is unable to provide any history due to confusion.  Past Medical History:  Diagnosis Date  . A-fib ([place]) 01/07/2012  . ANEMIA 07/29/2010   Qualifier: Diagnosis of  By: Bobby Rumpf CMA (AAMA), [place]    . Angina pectoris ([place])   . Anxiety   . Asthma   . Asthma 01/07/2012  . Barrett's esophagus   . Bronchitis   . Cancer ([place])    uterian  . Chest pain   . Coronary artery disease    right carotid occlusion   . Dementia with behavioral disturbance 09/02/2016  . Depression   . Dysrhythmia    atrial fibrillation  . GERD (gastroesophageal reflux disease)   . 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 Hyperlipidemia   . Hypertension   . Mood disorder ([place])   . Paranoia ([place]) 10/03/2016  . Recurrent upper respiratory infection (URI)   . Stroke [facility])    2007, after left endarterectomy    Patient Active Problem List   Diagnosis Date Noted  . Mania ([place]) 06/21/2017  . Hypomagnesemia 06/19/2017  . Essential hypertension 06/18/2017  . Acute encephalopathy 06/17/2017  . Acute lower UTI 06/17/2017  . E. coli UTI 06/17/2017  . Depression 04/24/2017  . Hypothyroidism 10/03/2016  . Paranoia ([place]) 10/03/2016  . LOC (loss of consciousness) ([place]) 09/02/2016  . 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 Dementia with behavioral disturbance 09/02/2016  . Asthma with acute exacerbation 08/21/2016  . Atrial fibrillation with RVR ([place]) 08/21/2016  . Elevated INR 08/21/2016  . Acute on chronic systolic CHF (congestive heart failure) ([place]) 08/21/2016  . Hyperlipidemia 08/21/2016  . Electrolyte abnormality 08/21/2016  . HCAP (healthcare-associated pneumonia) 08/10/2016  . Sepsis ([place]) 08/09/2016  . Coronary atherosclerosis of native coronary artery 01/21/2014  . Obesity 01/17/2014  . Abdominal pain, generalized 10/18/2012  . GERD (gastroesophageal reflux disease)   . Mood disorder ([place])   . Anxiety   . 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[place])   . Stroke ([place])   . Chest pain 01/07/2012  . Asthma 01/07/2012  . A-fib ([place]) 01/07/2012  . 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 Anemia 01/07/2012  . ANEMIA 07/29/2010  . BARRETTS ESOPHAGUS 06/04/2010    Past Surgical History:  Procedure Laterality Date  . ABDOMINAL HYSTERECTOMY    . BALLOON DILATION  10/18/2012   Procedure: BALLOON DILATION;  Surgeon:  Lear Ng, MD;  Location: [facility];  Service: Endoscopy;  Laterality: N/A;  . CARDIAC CATHETERIZATION    . CAROTID ENDARTERECTOMY     left, complicated by stroke  . CHOLECYSTECTOMY     incidental at time of colectomy  . COLONOSCOPY  12/01/2011   Procedure: COLONOSCOPY;  Surgeon: Lear Ng, MD;  Location: [facility];  Service: Endoscopy;  Laterality: N/A;  . COLONOSCOPY  10/18/2012   Procedure: COLONOSCOPY;  Surgeon: Lear Ng, MD;   Location: [facility];  Service: Endoscopy;  Laterality: N/A;  colonic dilation  . ESOPHAGOGASTRODUODENOSCOPY  12/01/2011   Procedure: ESOPHAGOGASTRODUODENOSCOPY (EGD);  Surgeon: Lear Ng, MD;  Location: [facility];  Service: Endoscopy;  Laterality: N/A;  . HOT HEMOSTASIS  12/01/2011   Procedure: HOT HEMOSTASIS (ARGON PLASMA COAGULATION/BICAP);  Surgeon: Lear Ng, MD;  Location: [facility];  Service: Endoscopy;  Laterality: N/A;  . 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 Authorizing Provider  acetaminophen (TYLENOL) 325 MG tablet Take 2 tablets (650 mg total) by mouth every 6 (six) hours as needed for mild pain (or Fever >/= 101). 08/14/16   Johnson, Clanford L, MD  albuterol (PROVENTIL HFA;VENTOLIN HFA) 108 (90 BASE) MCG/ACT inhaler Inhale 2 puffs into the lungs every 6 (six) hours as needed for shortness of breath.     [provider]  alendronate (FOSAMAX) 70 MG tablet Take 70 mg by mouth every Monday. Take with a full glass of water on an empty stomach.    [provider]  aspirin EC 81 MG tablet Take 81 mg by mouth daily.    [provider]  budesonide-formoterol (SYMBICORT) 160-4.5 MCG/ACT inhaler Inhale 2 puffs into the lungs 2 (two) times daily.    [provider]  busPIRone (BUSPAR) 10 MG tablet Take 10 mg by mouth 3 (three) times daily.    [provider]  carvedilol (COREG) 3.125 MG tablet Take 1 tablet (3.125 mg total) by mouth 2 (two) times daily with a meal. 08/14/16   Johnson, Clanford L, MD  Cholecalciferol (VITAMIN D3) 1000 units CAPS Take 1 capsule by mouth daily.    [provider]  dicyclomine (BENTYL) 10 MG capsule Take 10 mg by mouth. Take one capsule three times daily for IBS    [provider]  feeding  supplement, ENSURE ENLIVE, (ENSURE ENLIVE) LIQD Take 237 mLs by mouth. Drink once daily due to weight loss    [provider]  ipratropium-albuterol (DUONEB) 0.5-2.5 (3) MG/3ML SOLN Take 3 mLs by nebulization every 4 (four) hours as needed.     [provider]  isosorbide mononitrate (IMDUR) 30 MG 24 hr tablet Take 30 mg by mouth daily.    [provider]  levothyroxine (SYNTHROID, LEVOTHROID) 25 MCG tablet Take 25 mcg by mouth daily before breakfast.     [provider]  loratadine (CLARITIN) 10 MG tablet Take 10 mg by mouth daily.    [provider]  meclizine (ANTIVERT) 12.5 MG tablet Take 12.5 mg by mouth. Take one tablet three times a day as needed for dizziness    [provider]  mirtazapine (REMERON) 15 MG tablet Take 7.5 mg by mouth at bedtime. 1/2 TAB    [provider]  nitroGLYCERIN (NITROSTAT) 0.4 MG SL tablet Place 0.4 mg under the tongue every 5 (five) minutes as needed for chest pain.    [provider]  omeprazole (PRILOSEC) 40 MG capsule Take 20 mg by mouth daily.     [provider]  polyethylene glycol (MIRALAX / GLYCOLAX) packet Take 17 g by mouth daily as needed.     [provider]  QUEtiapine (SEROQUEL) 100 MG tablet Take 100 mg by mouth daily.    [provider]  QUEtiapine (SEROQUEL) 50 MG tablet Take 50 mg by mouth at bedtime.    [provider]  Rivaroxaban (XARELTO) 15 MG TABS tablet Take 15 mg by mouth daily.    [provider]  sertraline (ZOLOFT) 50 MG tablet Take 50 mg by mouth at bedtime.    [provider]  simvastatin (ZOCOR) 10 MG tablet Take 10 mg by mouth at bedtime.    [provider]  tiotropium (SPIRIVA) 18 MCG inhalation capsule Place 18 mcg into inhaler and inhale daily.    [provider]  traZODone (DESYREL) 150 MG tablet Take 150 mg by mouth. Take one 150 mg tablet with 25 mg tablet to equal 175 mg at bedtime     [provider]  traZODone (DESYREL) 50 MG tablet Take 50 mg by mouth. Take 1/2 tablet (25 mg) along with 150 mg tablet to equal 175 mg at bedtime    [provider]    Family History Family History  Problem Relation Age of Onset  . Heart disease Mother   . Cancer Mother   . Heart attack Father   . Cancer Sister        ovarian    Social History Social History   Tobacco Use  . Smoking status: Former Smoker    Last attempt to quit: 04/09/2003    Years since quitting: 14.5  . Smokeless tobacco: Never Used  Substance Use Topics  . Alcohol use: No  . Drug use: No     Allergies   Patient has no known allergies.   Review of Systems Review of Systems  Unable to perform ROS: Mental status change     Physical Exam Updated Vital Signs BP (!) 182/98 (BP Location: Left Arm)   Pulse 75   Temp 98 F (36.7 C) (Oral)   Resp 20   SpO2 99%   Physical Exam  Constitutional: She appears well-developed and well-nourished. No distress.  HENT:  Head: Normocephalic.  Eyes: Conjunctivae and EOM are normal. Pupils are equal, round, and reactive to light.  Neck: Neck supple.  Cardiovascular: Normal rate, regular rhythm and normal heart sounds.  Pulmonary/Chest: Effort normal and breath sounds normal. No respiratory distress. She has no wheezes. She has no rales.  Abdominal: Soft. Bowel sounds are normal. She exhibits no distension. There is no tenderness. There is no rebound.  Musculoskeletal: She exhibits no edema.  Neurological: She is alert.  Disoriented.  Will not follow directions.  Moving all extremities equally  Skin: Skin is warm and dry.  Psychiatric:  Confused  Nursing note and vitals reviewed.    ED Treatments / Results  Labs (all labs ordered are listed, but only abnormal results are displayed) Labs Reviewed  URINE CULTURE - Abnormal; Notable for the following components:      Result Value   Culture   (*)    Value: >=100,000 COLONIES/mL  ESCHERICHIA COLI SUSCEPTIBILITIES TO FOLLOW Performed at [facility], [address],  [place] [address]    All other components within normal limits  COMPREHENSIVE METABOLIC PANEL - Abnormal; Notable for the following components:   Glucose, Bld 150 (*)    GFR calc non Af Amer 57 (*)    All other components within normal limits  URINALYSIS, ROUTINE W REFLEX MICROSCOPIC - Abnormal; Notable for the following components:   Color, Urine AMBER (*)    APPearance HAZY (*)    Hgb urine dipstick SMALL (*)    Protein, ur 100 (*)    Leukocytes, UA TRACE (*)    Bacteria, UA MANY (*)    Squamous Epithelial / LPF 0-5 (*)    All other components within normal limits  BASIC METABOLIC PANEL - Abnormal; Notable for the following components:   Chloride 112 (*)    Glucose, Bld 109 (*)    All other components within normal limits  MRSA PCR SCREENING  CBC WITH DIFFERENTIAL/PLATELET  TSH  RAPID URINE DRUG SCREEN, HOSP PERFORMED  CBC    EKG  EKG Interpretation None       Radiology Ct Head Wo Contrast  Result Date: 11/08/2017 CLINICAL DATA:  Altered level of consciousness. EXAM: CT HEAD WITHOUT CONTRAST TECHNIQUE: Contiguous axial images were obtained from the base of the skull through the vertex without intravenous contrast. COMPARISON:  CT scan of August 25, 2016. FINDINGS: Brain: Old left frontal and right posterior parietal infarctions are noted. No mass effect or midline shift is noted. Ventricular size is within normal limits. There is no evidence of mass lesion, hemorrhage or acute infarction. Vascular: No hyperdense vessel or unexpected calcification. Skull: No fracture or other bony abnormality is noted. Fluid is noted in right mastoid air cells. Sinuses/Orbits: No acute finding. Other: None. IMPRESSION: Old left frontal and right posterior parietal infarctions. No acute intracranial abnormality seen. Electronically Signed   By: Marijo Conception, M.D.   On: 11/08/2017 16:57     neration in the right brainstem. 4. Chronic right mastoid effusion. Electronically Signed   By: Genevie Ann M.D.   On: 11/09/2017 08:16   Dg Chest Port 1 View  Result Date: 11/09/2017 CLINICAL DATA:  Acute onset of confusion.  Encephalopathy. EXAM: PORTABLE CHEST 1 VIEW COMPARISON:  Chest radiograph performed 08/25/2016 FINDINGS: The lungs are well-aerated and clear. There is no evidence of focal opacification, pleural effusion or pneumothorax. The cardiomediastinal silhouette is within normal limits. No acute osseous abnormalities are seen. There is chronic deformity of the proximal right humerus. IMPRESSION: No acute cardiopulmonary process seen. Electronically Signed   By: Garald Balding M.D.   On: 11/09/2017 06:46    Procedures Procedures (including critical care time)  Medications Ordered in ED Medications - No data to display   Initial Impression / Assessment and Plan / ED Course  I have reviewed the triage vital signs and the nursing notes.  Pertinent labs & imaging results that were available during my care of the patient were reviewed by me and considered in my medical decision making (see chart for details).    Pt in ED with worsening altered mental status, comiging from facility.  Patient apparently not eating, not drinking unless the daughter forces her.  She has not taking her medications.  Plan to do labs, urinalysis, evaluate medically.  If medically cleared, patient will need TTS assessment.   Patient signed out at shift change, pending labs, urinalysis, CT head.  Plan to admit medically if any abnormalities that could potentially cause her to be more altered.  If negative, patient will need psychiatric assessment.  Vitals:   11/08/17 2326 11/09/17 0649 11/09/17 1143 11/09/17 1327  BP:  134/85  (!) 173/84  Pulse:  86  (!) 56  Resp:    16  Temp:    97.7 F (36.5 C)  TempSrc:    Axillary  SpO2:   95% 95%  Weight: 67.2 kg (148 lb 2.4 oz)     Height: 5' (1.524 m)         Final Clinical Impressions(s) / ED Diagnoses   Final diagnoses:  Acute delirium  Acute cystitis without hematuria    ED Discharge Orders    None       Jeannett Senior, PA-C 11/09/17 1446    Jeannett Senior, PA-C 11/09/17 1453    Dorie Rank, MD 11/11/17 [phone]

## 2017-11-08 NOTE — H&P (Signed)
History and Physical    Cindy [name] YTK:[id number] DOB: 06/12/1941 DOA: 11/08/2017  PCP: [name], MD  Patient coming from: Skilled nursing facility.  Chief Complaint: Altered mental status.  HPI: Cindy [name] is a 76 y.o. female with history of dementia, atrial fibrillation, asthma, uterine cancer, CVA, CAD, Barrett's esophagus was brought to the ER after patient was found to be increasingly confused with hallucination and agitation in the skilled nursing facility for last 2 days.  Patient has been eating poorly and has not taken her medications as per the daughter who provided the history to the ER physician.  Patient's daughter had expressed a concern that she usually gets confused during UTI but this appears to be more than her usual.  ED Course: In the ER patient is found to be confused and had required 1 dose of Ativan for the agitation.  CT of the head was unremarkable.  UA is compatible with UTI.  ER physician had discussed with on-call behavioral health team who at this time requested to treat UTI and if it does not get better to reconsult psychiatry.   Review of Systems: As per HPI, rest all negative.   Past Medical History:  Diagnosis Date  . A-fib ([place]) 01/07/2012  . ANEMIA 07/29/2010   Qualifier: Diagnosis of  By: Bobby Rumpf CMA (AAMA), Patty    . Angina pectoris ([place])   . Anxiety   . Asthma   . Asthma 01/07/2012  . Barrett's esophagus   . Bronchitis   . Cancer ([place])    uterian  . Chest pain   . Coronary artery disease    right carotid occlusion   . Dementia with behavioral disturbance 09/02/2016  . Depression   . Dysrhythmia    atrial fibrillation  . GERD (gastroesophageal reflux disease)   . 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 Hyperlipidemia   . Hypertension   . Mood disorder ([place])   . Paranoia ([place]) 10/03/2016  . Recurrent upper respiratory infection (URI)   . Stroke [facility])    2007, after left endarterectomy    Past Surgical History:    Procedure Laterality Date  . ABDOMINAL HYSTERECTOMY    . BALLOON DILATION  10/18/2012   Procedure: BALLOON DILATION;  Surgeon: Lear Ng, MD;  Location: [facility] END[facility]COPY;  Service: Endoscopy;  Laterality: N/A;  . CARDIAC CATHETERIZATION    . CAROTID ENDARTERECTOMY     left, complicated by stroke  . CHOLECYSTECTOMY     incidental at time of colectomy  . COLONOSCOPY  12/01/2011   Procedure: COLONOSCOPY;  Surgeon: Lear Ng, MD;  Location: [facility];  Service: Endoscopy;  Laterality: N/A;  . COLONOSCOPY  10/18/2012   Procedure: COLONOSCOPY;  Surgeon: Lear Ng, MD;  Location: [facility];  Service: Endoscopy;  Laterality: N/A;  colonic dilation  . ESOPHAGOGASTRODUODENOSCOPY  12/01/2011   Procedure: ESOPHAGOGASTRODUODENOSCOPY (EGD);  Surgeon: Lear Ng, MD;  Location: [facility];  Service: Endoscopy;  Laterality: N/A;  . HOT HEMOSTASIS  12/01/2011   Procedure: HOT HEMOSTASIS (ARGON PLASMA COAGULATION/BICAP);  Surgeon: Lear Ng, MD;  Location: [facility];  Service: Endoscopy;  Laterality: N/A;  . 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 she has never used smokeless tobacco. She reports that she does not drink alcohol or use drugs.  No Known Allergies  Family History  Problem Relation  Age of Onset  . Heart disease Mother   . Cancer Mother   . Heart attack Father   . Cancer Sister        ovarian    Prior to Admission medications   Medication Sig Start Date End Date Taking? Authorizing Provider  acetaminophen (TYLENOL) 325 MG tablet Take 2 tablets (650 mg total) by mouth every 6 (six) hours as needed for mild pain (or Fever >/= 101). 08/14/16   Johnson, Clanford L, MD  albuterol (PROVENTIL HFA;VENTOLIN HFA) 108 (90 BASE) MCG/ACT inhaler Inhale 2 puffs into the lungs every 6 (six) hours as needed for shortness of breath.     [provider]  alendronate (FOSAMAX) 70 MG tablet Take 70 mg by mouth every Monday. Take with a full glass of water on an empty stomach.    [provider]  aspirin EC 81 MG tablet Take 81 mg by mouth daily.    [provider]  budesonide-formoterol (SYMBICORT) 160-4.5 MCG/ACT inhaler Inhale 2 puffs into the lungs 2 (two) times daily.    [provider]  busPIRone (BUSPAR) 10 MG tablet Take 10 mg by mouth 3 (three) times daily.    [provider]  carvedilol (COREG) 3.125 MG tablet Take 1 tablet (3.125 mg total) by mouth 2 (two) times daily with a meal. 08/14/16   Johnson, Clanford L, MD  Cholecalciferol (VITAMIN D3) 1000 units CAPS Take 1 capsule by mouth daily.    [provider]  dicyclomine (BENTYL) 10 MG capsule Take 10 mg by mouth. Take one capsule three times daily for IBS    [provider]  feeding supplement, ENSURE ENLIVE, (ENSURE ENLIVE) LIQD Take 237 mLs by mouth. Drink once daily due to weight loss    [provider]  ipratropium-albuterol (DUONEB) 0.5-2.5 (3) MG/3ML SOLN Take 3 mLs by nebulization every 4 (four) hours as needed.     [provider]  isosorbide mononitrate (IMDUR) 30 MG 24 hr tablet Take 30 mg by mouth daily.    [provider]  levothyroxine (SYNTHROID, LEVOTHROID) 25 MCG tablet Take 25 mcg by mouth daily before breakfast.     [provider]  loratadine (CLARITIN) 10 MG tablet Take 10 mg by mouth daily.    [provider]  meclizine (ANTIVERT) 12.5 MG tablet Take 12.5 mg by mouth. Take one tablet three times a day as needed for dizziness    [provider]  mirtazapine (REMERON) 15 MG tablet Take 7.5 mg by mouth at bedtime. 1/2 TAB    [provider]  nitroGLYCERIN (NITROSTAT) 0.4 MG SL tablet Place 0.4 mg under the tongue every 5 (five) minutes as needed for chest pain.    [provider]  omeprazole (PRILOSEC) 40 MG capsule Take 20 mg by mouth daily.      [provider]  polyethylene glycol (MIRALAX / GLYCOLAX) packet Take 17 g by mouth daily as needed.     [provider]  QUEtiapine (SEROQUEL) 100 MG tablet Take 100 mg by mouth daily.    [provider]  QUEtiapine (SEROQUEL) 50 MG tablet Take 50 mg by mouth at bedtime.    [provider]  Rivaroxaban (XARELTO) 15 MG TABS tablet Take 15 mg by mouth daily.    [provider]  sertraline (ZOLOFT) 50 MG tablet Take 50 mg by mouth at bedtime.    [provider]  simvastatin (ZOCOR) 10 MG tablet Take 10 mg by mouth at bedtime.  [provider]  tiotropium (SPIRIVA) 18 MCG inhalation capsule Place 18 mcg into inhaler and inhale daily.    [provider]  traZODone (DESYREL) 150 MG tablet Take 150 mg by mouth. Take one 150 mg tablet with 25 mg tablet to equal 175 mg at bedtime    [provider]  traZODone (DESYREL) 50 MG tablet Take 50 mg by mouth. Take 1/2 tablet (25 mg) along with 150 mg tablet to equal 175 mg at bedtime    [provider]    Physical Exam: Vitals:   11/08/17 1315 11/08/17 1325  BP:  (!) 182/98  Pulse:  75  Resp:  20  Temp:  98 F (36.7 C)  TempSrc:  Oral  SpO2: 98% 99%      Constitutional: Moderately built and nourished. Vitals:   11/08/17 1315 11/08/17 1325  BP:  (!) 182/98  Pulse:  75  Resp:  20  Temp:  98 F (36.7 C)  TempSrc:  Oral  SpO2: 98% 99%   Eyes: Anicteric no pallor. ENMT: No discharge from the ears eyes nose or mouth. Neck: No mass felt.  No neck rigidity. Respiratory: No rhonchi or crepitations. Cardiovascular: S1-S2 heard no murmurs appreciated. Abdomen: Soft nontender bowel sounds present. Musculoskeletal: No edema.  No joint effusion. Skin: No rash. Neurologic: Patient appears confused responds to her name but does not follow commands. Psychiatric: Appears confused.   Labs on Admission: I have personally reviewed following labs and imaging  studies  CBC: Recent Labs  Lab 11/08/17 1512  WBC 7.0  NEUTROABS 5.4  HGB 13.8  HCT 42.8  MCV 87.2  PLT 637   Basic Metabolic Panel: Recent Labs  Lab 11/08/17 1512  NA 141  K 3.7  CL 105  CO2 26  GLUCOSE 150*  BUN 20  CREATININE 0.94  CALCIUM 9.9   GFR: Estimated Creatinine Clearance: 42.4 mL/min (by C-G formula based on SCr of 0.94 mg/dL). Liver Function Tests: Recent Labs  Lab 11/08/17 1512  AST 18  ALT 20  ALKPHOS 64  BILITOT 0.9  PROT 6.9  ALBUMIN 3.8   No results for input(s): LIPASE, AMYLASE in the last 168 hours. No results for input(s): AMMONIA in the last 168 hours. Coagulation Profile: No results for input(s): INR, PROTIME in the last 168 hours. Cardiac Enzymes: No results for input(s): CKTOTAL, CKMB, CKMBINDEX, TROPONINI in the last 168 hours. BNP (last 3 results) No results for input(s): PROBNP in the last 8760 hours. HbA1C: No results for input(s): HGBA1C in the last 72 hours. CBG: No results for input(s): GLUCAP in the last 168 hours. Lipid Profile: No results for input(s): CHOL, HDL, LDLCALC, TRIG, CHOLHDL, LDLDIRECT in the last 72 hours. Thyroid Function Tests: Recent Labs    11/08/17 1512  TSH 1.680   Anemia Panel: No results for input(s): VITAMINB12, FOLATE, FERRITIN, TIBC, IRON, RETICCTPCT in the last 72 hours. Urine analysis:    Component Value Date/Time   COLORURINE AMBER (A) 11/08/2017 1501   APPEARANCEUR HAZY (A) 11/08/2017 1501   LABSPEC 1.020 11/08/2017 1501   PHURINE 5.0 11/08/2017 1501   GLUCOSEU NEGATIVE 11/08/2017 1501   HGBUR SMALL (A) 11/08/2017 1501   BILIRUBINUR NEGATIVE 11/08/2017 1501   KETONESUR NEGATIVE 11/08/2017 1501   PROTEINUR 100 (A) 11/08/2017 1501   UROBILINOGEN 0.2 01/02/2015 1313   NITRITE NEGATIVE 11/08/2017 1501   LEUKOCYTESUR TRACE (A) 11/08/2017 1501   Sepsis Labs: @LABRCNTIP (procalcitonin:4,lacticidven:4) )No results found for this or any previous visit (from the past 240  hour(s)).    Radiological Exams on Admission: Ct Head Wo Contrast  Result Date: 11/08/2017 CLINICAL DATA:  Altered level of consciousness. EXAM: CT HEAD WITHOUT CONTRAST TECHNIQUE: Contiguous axial images were obtained from the base of the skull through the vertex without intravenous contrast. COMPARISON:  CT scan of August 25, 2016. FINDINGS: Brain: Old left frontal and right posterior parietal infarctions are noted. No mass effect or midline shift is noted. Ventricular size is within normal limits. There is no evidence of mass lesion, hemorrhage or acute infarction. Vascular: No hyperdense vessel or unexpected calcification. Skull: No fracture or other bony abnormality is noted. Fluid is noted in right mastoid air cells. Sinuses/Orbits: No acute finding. Other: None. IMPRESSION: Old left frontal and right posterior parietal infarctions. No acute intracranial abnormality seen. Electronically Signed   By: Marijo Conception, M.D.   On: 11/08/2017 16:57     Assessment/Plan Principal Problem:   Acute encephalopathy Active Problems:   Asthma   A-fib (H[facility])   Mood disorder (HCC)   Dementia with behavioral disturbance   Hypothyroidism   Acute lower UTI   Essential hypertension    1. Acute encephalopathy -cause unclear.  Since UA shows possible UTI patient is placed on empiric antibiotics.  Follow urine cultures.  Will get MRI of the brain since patient's daughter feels her confusion is more than usual for her when she has UTI.  If confusion persists after being treated for UTI and MRI is negative then may need psychiatry consult. 2. Dementia with behavioral disturbances -on Seroquel mirtazapine Zoloft trazodone and BuSpar. 3. History of A. Fib -on Xarelto and Coreg. 4. CAD -on Imdur statins aspirin Coreg and Xarelto. 5. History of asthma -presently not wheezing continue inhalers. 6. Hypothyroidism on Synthroid.  We will get swallow evaluation. Chest x-ray pending.   DVT prophylaxis: Xarelto. Code  Status: DNR. Family Communication: No family at the bedside. Disposition Plan: Skilled nursing facility. Consults called: Swallow evaluation. Admission status: Observation.   Rise Patience MD [facility] Pager [phone].  If 7PM-7AM, please contact night-coverage [email or web address] Password [facility]  11/08/2017, 9:27 PM

## 2017-11-08 NOTE — ED Triage Notes (Addendum)
Per EMS, pt is coming from [facility] after staff called and stated that pt has had a change in her behavior. Staff reports that pt is aggressive and combative and having hallucinations x1 week. Staff reports doing a urinalysis and getting a negative result for a UTI. Pt has a hx of dementia and is currently disoriented.

## 2017-11-08 NOTE — ED Notes (Signed)
Attempted to call report,  Ubaldo Glassing to call back.  # provided.

## 2017-11-08 NOTE — ED Notes (Signed)
TTS then [facility] to admit

## 2017-11-08 NOTE — ED Notes (Signed)
TTS assessment in progress. 

## 2017-11-08 NOTE — ED Notes (Signed)
Bed: GK81 Expected date:  Expected time:  Means of arrival:  Comments: EMS-aggressive geri

## 2017-11-08 NOTE — ED Provider Notes (Signed)
76 year old female received at signout from [facility] pending head CT and UA. Per her history:   "Cindy [name] is a 76 y.o. female with history of A. fib, asthma, anemia, depression, dementia, presents to emergency department complaining of altered mental status.  Patient is coming from a skilled nursing facility accompanied by her daughter.  Patient's daughter providing the history.  Patient with increased confusion, paranoid behavior, aggressive and combative for the last 2 weeks.  Patient is refusing her medications at the facility.  She is not eating or drinking unless forced.  She is more confused.  Daughter states although patient has had urinary tract infections before with increased confusion, she believes this may be something else going on.  She is asking for psychiatric evaluation.  Patient also possibly had a fall several days ago, was found laying near her bed.  No obvious signs of trauma.  Patient is unable to provide any history due to confusion."   Daughter: Cindy [name] [phone]  Work# [phone] who would like to be updated on the patient's hospital course.  Physical Exam  BP (!) 184/76 (BP Location: Right Arm)   Pulse 70   Temp 98 F (36.7 C) (Oral)   Resp 20   SpO2 99%   Physical Exam Patient appears agitated. Speaking in complete sentences. Ambulates independently to the restroom.  ED Course/Procedures     Procedures  MDM  76 year old female with a history of dementia, UTIs, depression, A.fib, asthma, and anemia presenting with worsening baseline confusion, aggressive, combative, and paranoid behavior over the last 2 weeks.  She has refused all of her medications for the last week, and has refused her psych medications for the last 2 weeks.  Urinalysis consistent with infection.  Reviewed previous culture, which was sensitive to ceftriaxone.  Ceftriaxone ordered in the ED.  The patient's daughter reports that when she was previously admitted that she  pulled and tugged at her IV, which had to be replaced several times.  Ativan ordered for agitation.  Consulted the hospitalist team and spoke with Dr. Hal Hope who recommended TTS consult prior to admission for acute delirium secondary to UTI.  TTS consulted who recommended treatment of medical conditions that could be exacerbating her symptoms and recommended reconsulting psychiatry if her symptoms did not improve after treatment. The patient appears reasonably stabilized for admission considering the current resources, flow, and capabilities available in the ED at this time, and I doubt any other [facility] requiring further screening and/or treatment in the ED prior to admission.       Joline Maxcy A, PA-C 11/08/17 2217    Joanne Gavel, PA-C 11/08/17 2218    Tegeler, Gwenyth Allegra, MD 11/08/17 [phone]

## 2017-11-08 NOTE — BH Assessment (Addendum)
Assessment Note  Cindy [name] is an 76 y.o. female who presents to the ED voluntarily due to Cindy [name] and worsening aggression at her living facility. Per chart, the pt has been experiencing hallucinations for the past week and staff at her Cindy [name] living facility continued to observe [name] swift decline in her mental health. Pt has [name] hx of Dementia. During the assessment, the pt appeared confused and disoriented. The pt's speech was irrelevant and tangential. Pt stated "you can't eat when you have [name] new baby. [name] is older than I am. I got aids on both sides. 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  Case discussed with Cindy Loveless, NP who recommends the pt be reassessed once she is medically cleared to determine if she is still displaying symptoms of confusion and disorentation. EDP Cindy [name], Cindy A, PA-C was advised of the recommendation and states the pt is going to be medically admitted due to acute delirium and possible UTI. Pt's nurse [name], RN has been made aware of recommendation.    Diagnosis: Mild neurocognitive disorder due to another medical condition  Past Medical History:  Past Medical History:  Diagnosis Date  . [name]-fib ([place]) 01/07/2012  . ANEMIA 07/29/2010   Qualifier: Diagnosis of  By: Bobby Rumpf CMA (AAMA), Patty    . Angina pectoris ([place])   . Anxiety   . Asthma   . Asthma 01/07/2012  . Barrett's esophagus   . Bronchitis   . Cancer ([place])    uterian  . Chest pain   . Coronary artery disease    right carotid occlusion   . Dementia with behavioral disturbance 09/02/2016  . Depression   . Dysrhythmia    atrial fibrillation  . GERD (gastroesophageal reflux disease)   . 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 Hyperlipidemia   . Hypertension   . Mood disorder ([place])   . Paranoia ([place]) 10/03/2016  . Recurrent upper respiratory infection (URI)   . Stroke [facility])    2007, after left endarterectomy     Past Surgical History:  Procedure Laterality Date  . ABDOMINAL HYSTERECTOMY    . BALLOON DILATION  10/18/2012   Procedure: BALLOON DILATION;  Surgeon: Lear Ng, MD;  Location: [facility];  Service: Endoscopy;  Laterality: N/[name];  . CARDIAC CATHETERIZATION    . CAROTID ENDARTERECTOMY     left, complicated by stroke  . CHOLECYSTECTOMY     incidental at time of colectomy  . COLONOSCOPY  12/01/2011   Procedure: COLONOSCOPY;  Surgeon: Lear Ng, MD;  [facility];  Service: Endoscopy;  Laterality: N/[name];  . COLONOSCOPY  10/18/2012   Procedure: COLONOSCOPY;  Surgeon: Lear Ng, MD;  [facility];  Service: Endoscopy;  Laterality: N/[name];  colonic dilation  . ESOPHAGOGASTRODUODENOSCOPY  12/01/2011   Procedure: ESOPHAGOGASTRODUODENOSCOPY (EGD);  Surgeon: Lear Ng, MD;  Location: [facility];  Service: Endoscopy;  Laterality: N/[name];  . HOT HEMOSTASIS  12/01/2011   Procedure: HOT HEMOSTASIS (ARGON PLASMA COAGULATION/BICAP);  Surgeon: Lear Ng, MD;  Location: [facility] ENDOS[facility];  Service: Endoscopy;  Laterality: N/[name];  . 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 Heart disease Mother   . Cancer Mother   . Heart attack Father   . Cancer Sister  ovarian    Social History:  reports that she quit smoking about 14 years ago. she has never used smokeless tobacco. She reports that she does not drink alcohol or use drugs.  Additional Social History:  Alcohol / Drug Use Pain Medications: See MAR Prescriptions: See MAR Over the Counter: See MAR History of alcohol / drug use?: No history of alcohol / drug abuse  CIWA: CIWA-Ar BP: (!) 182/98 Pulse Rate: 75 COWS:    Allergies: No Known Allergies  Home Medications:  (Not in [name] hospital admission)  OB/GYN Status:  No LMP recorded. Patient has had [name] hysterectomy.  General  Assessment Data [facility] of [facility]: [facility] TTS Assessment: In system Is this [name] Tele or Face-to-Face Assessment?: Face-to-Face Is this an Initial Assessment or [name] Re-assessment for this encounter?: Initial Assessment Marital status: Other (comment)(unknown) Is patient pregnant?: No Pregnancy Status: No Living Arrangements: Other (Comment)([facility] assisted living facility) Can pt return to current living arrangement?: Yes Admission Status: Voluntary Is patient capable of signing voluntary admission?: Yes Referral Source: Self/Family/Friend Insurance type: Medicare     Crisis Care Plan Living Arrangements: Other (Comment)([facility] assisted living facility) Name of Psychiatrist: UTA due to [place] Name of Therapist: UTA due to AMS  Education Status Is patient currently in school?: No Highest grade of school patient has completed: UTA due to AMS  Risk to self with the past 6 months Suicidal Ideation: No(pt denies ) Has patient been [name] risk to self within the past 6 months prior to admission? : No Suicidal Intent: No Has patient had any suicidal intent within the past 6 months prior to admission? : No Is patient at risk for suicide?: No Suicidal Plan?: No Has patient had any suicidal plan within the past 6 months prior to admission? : No Access to Means: No What has been your use of drugs/alcohol within the last 12 months?: none reported  Previous Attempts/Gestures: No Triggers for Past Attempts: None known Intentional Self Injurious Behavior: None Family Suicide History: Unknown Recent stressful life event(s): Recent negative physical changes Persecutory voices/beliefs?: (UTA due to AMS) Depression: (UTA due to AMS) Depression Symptoms: Feeling angry/irritable(per ED reports ) Substance abuse history and/or treatment for substance abuse?: No Suicide prevention information given to non-admitted patients: Not applicable  Risk to Others within the past 6 months Homicidal  Ideation: No Does patient have any lifetime risk of violence toward others beyond the six months prior to admission? : Yes (comment)(per chart, pt has been aggressive in her nursing home ) Thoughts of Harm to Others: No-Not Currently Present/Within Last 6 Months Current Homicidal Intent: No Current Homicidal Plan: No Access to Homicidal Means: No History of harm to others?: Yes Assessment of Violence: On admission Violent Behavior Description: pt reportedly has been aggressive with nursing home staff and residents  Does patient have access to weapons?: No Criminal Charges Pending?: No Does patient have [name] court date: No Is patient on probation?: No  Psychosis Hallucinations: Auditory, Visual(per chart) Delusions: Unspecified  Mental Status Report Appearance/Hygiene: Disheveled Eye Contact: Fair Motor Activity: Unsteady Speech: Incoherent, Tangential Level of Consciousness: Alert Mood: Labile Affect: Inconsistent with thought content Anxiety Level: None Thought Processes: Irrelevant, Circumstantial, Flight of Ideas Judgement: Impaired Orientation: Not oriented Obsessive Compulsive Thoughts/Behaviors: Minimal  Cognitive Functioning Concentration: Poor Memory: Remote Impaired, Recent Impaired IQ: Average Insight: Poor Impulse Control: Poor Appetite: Poor Sleep: Unable to Assess Total Hours of Sleep: (UTA due to AMS) Vegetative Symptoms: Unable to Assess  ADLScreening [facility])  Patient's cognitive ability adequate to safely complete daily activities?: No Patient able to express need for assistance with ADLs?: Yes Independently performs ADLs?: No  Prior Inpatient Therapy Prior Inpatient Therapy: (UTA due to AMS)  Prior Outpatient Therapy Prior Outpatient Therapy: (UTA due to AMS) Does patient have an ACCT team?: No Does patient have Intensive In-House Services?  : No Does patient have [facility] services? : No Does patient have P4CC services?: No  ADL  Screening (condition at time of admission) Patient's cognitive ability adequate to safely complete daily activities?: No Is the patient deaf or have difficulty hearing?: Yes Does the patient have difficulty seeing, even when wearing glasses/contacts?: Yes Does the patient have difficulty concentrating, remembering, or making decisions?: Yes Patient able to express need for assistance with ADLs?: Yes Does the patient have difficulty dressing or bathing?: Yes Independently performs ADLs?: No Communication: Independent Dressing (OT): Needs assistance Is this [name] change from baseline?: Pre-admission baseline Grooming: Needs assistance Is this [name] change from baseline?: Pre-admission baseline Feeding: Needs assistance Is this [name] change from baseline?: Pre-admission baseline Bathing: Needs assistance Is this [name] change from baseline?: Pre-admission baseline Toileting: Needs assistance Is this [name] change from baseline?: Pre-admission baseline In/Out Bed: Needs assistance Is this [name] change from baseline?: Pre-admission baseline Walks in Home: Dependent Is this [name] change from baseline?: Pre-admission baseline Does the patient have difficulty walking or climbing stairs?: Yes Weakness of Legs: Both Weakness of Arms/Hands: Both  Home Assistive Devices/Equipment Home Assistive Devices/Equipment: Dentures (specify type), Eyeglasses, Wheelchair  Therapy Consults (therapy consults require [name] physician order) PT Evaluation Needed: No OT Evalulation Needed: No SLP Evaluation Needed: No Abuse/Neglect Assessment (Assessment to be complete while patient is alone) Abuse/Neglect Assessment Can Be Completed: Yes Physical Abuse: Denies Verbal Abuse: Denies Sexual Abuse: Denies Exploitation of patient/patient's resources: Denies Self-Neglect: Denies Values / Beliefs Cultural Requests During Hospitalization: None Spiritual Requests During Hospitalization: None Consults Spiritual Care Consult Needed: No Social  Work Consult Needed: Yes (Comment) Regulatory affairs officer (For Healthcare) Does Patient Have [name] Medical Advance Directive?: Yes Does patient want to make changes to medical advance directive?: No - Patient declined Type of Advance Directive: Healthcare Power of Attorney(per nurse report, pt's daughter is POA) Nutrition Screen- [facility] Patient's home diet: Other (Comment)(soft) Has the patient recently lost weight without trying?: No Has the patient been eating poorly because of [name] decreased appetite?: Yes(hungry but is too paranoid to eat) Malnutrition Screening Tool Score: 1  Additional Information 1:1 In Past 12 Months?: No CIRT Risk: No Elopement Risk: No Does patient have medical clearance?: No     Disposition: Case discussed with Cindy Loveless, NP who recommends the pt be reassessed once she is medically cleared to determine if she is still displaying symptoms of confusion and disorentation. EDP Cindy [name], Cindy A, PA-C was advised of the recommendation and states the pt is going to be medically admitted due to acute delirium and possible UTI. Pt's nurse [name], RN has been made aware of recommendation.   Disposition Initial Assessment Completed for this Encounter: Yes Disposition of Patient: Other dispositions Other disposition(s): Other (Comment)(reconsult if necessary after medical admission)  On Site Evaluation by:   Reviewed with Physician:    Lyanne Co 11/08/2017 8:30 PM

## 2017-11-08 NOTE — Progress Notes (Signed)
Pharmacy Antibiotic Note  Cindy [name] is a 76 y.o. female admitted on 11/08/2017 with UTI.  Pharmacy has been consulted for Rocepin dosing.  Plan:  Rocephin 1 gm IV q24hrs Pharmacy to sign off  Temp (24hrs), Avg:98 F (36.7 C), Min:98 F (36.7 C), Max:98 F (36.7 C)  Recent Labs  Lab 11/08/17 1512  WBC 7.0  CREATININE 0.94    Estimated Creatinine Clearance: 42.4 mL/min (by C-G formula based on SCr of 0.94 mg/dL).    No Known Allergies Thank you for allowing pharmacy to be a part of this patient's care. Eudelia Bunch, Pharm.D. [phone] 11/08/2017 9:36 PM

## 2017-11-09 ENCOUNTER — Observation Stay (HOSPITAL_COMMUNITY): Payer: Medicare Other

## 2017-11-09 DIAGNOSIS — J45909 Unspecified asthma, uncomplicated: Secondary | ICD-10-CM | POA: Diagnosis present

## 2017-11-09 DIAGNOSIS — E039 Hypothyroidism, unspecified: Secondary | ICD-10-CM | POA: Diagnosis present

## 2017-11-09 DIAGNOSIS — I482 Chronic atrial fibrillation: Secondary | ICD-10-CM | POA: Diagnosis not present

## 2017-11-09 DIAGNOSIS — I6521 Occlusion and stenosis of right carotid artery: Secondary | ICD-10-CM | POA: Diagnosis present

## 2017-11-09 DIAGNOSIS — I480 Paroxysmal atrial fibrillation: Secondary | ICD-10-CM | POA: Diagnosis not present

## 2017-11-09 DIAGNOSIS — Z66 Do not resuscitate: Secondary | ICD-10-CM | POA: Diagnosis present

## 2017-11-09 DIAGNOSIS — D649 Anemia, unspecified: Secondary | ICD-10-CM | POA: Diagnosis present

## 2017-11-09 DIAGNOSIS — I69391 Dysphagia following cerebral infarction: Secondary | ICD-10-CM | POA: Diagnosis not present

## 2017-11-09 DIAGNOSIS — F39 Unspecified mood [affective] disorder: Secondary | ICD-10-CM | POA: Diagnosis present

## 2017-11-09 DIAGNOSIS — F419 Anxiety disorder, unspecified: Secondary | ICD-10-CM | POA: Diagnosis present

## 2017-11-09 DIAGNOSIS — N3000 Acute cystitis without hematuria: Secondary | ICD-10-CM

## 2017-11-09 DIAGNOSIS — F329 Major depressive disorder, single episode, unspecified: Secondary | ICD-10-CM | POA: Diagnosis present

## 2017-11-09 DIAGNOSIS — I10 Essential (primary) hypertension: Secondary | ICD-10-CM | POA: Diagnosis present

## 2017-11-09 DIAGNOSIS — R4182 Altered mental status, unspecified: Secondary | ICD-10-CM | POA: Diagnosis present

## 2017-11-09 DIAGNOSIS — G9341 Metabolic encephalopathy: Secondary | ICD-10-CM | POA: Diagnosis not present

## 2017-11-09 DIAGNOSIS — Z87891 Personal history of nicotine dependence: Secondary | ICD-10-CM | POA: Diagnosis not present

## 2017-11-09 DIAGNOSIS — F05 Delirium due to known physiological condition: Secondary | ICD-10-CM | POA: Diagnosis present

## 2017-11-09 DIAGNOSIS — K2270 Barrett's esophagus without dysplasia: Secondary | ICD-10-CM | POA: Diagnosis present

## 2017-11-09 DIAGNOSIS — B9620 Unspecified Escherichia coli [E. coli] as the cause of diseases classified elsewhere: Secondary | ICD-10-CM | POA: Diagnosis present

## 2017-11-09 DIAGNOSIS — G9340 Encephalopathy, unspecified: Secondary | ICD-10-CM

## 2017-11-09 DIAGNOSIS — R1312 Dysphagia, oropharyngeal phase: Secondary | ICD-10-CM | POA: Diagnosis present

## 2017-11-09 DIAGNOSIS — F0391 Unspecified dementia with behavioral disturbance: Secondary | ICD-10-CM | POA: Diagnosis present

## 2017-11-09 DIAGNOSIS — R410 Disorientation, unspecified: Secondary | ICD-10-CM | POA: Diagnosis not present

## 2017-11-09 DIAGNOSIS — I2510 Atherosclerotic heart disease of native coronary artery without angina pectoris: Secondary | ICD-10-CM | POA: Diagnosis present

## 2017-11-09 DIAGNOSIS — G4700 Insomnia, unspecified: Secondary | ICD-10-CM | POA: Diagnosis not present

## 2017-11-09 DIAGNOSIS — K219 Gastro-esophageal reflux disease without esophagitis: Secondary | ICD-10-CM | POA: Diagnosis present

## 2017-11-09 DIAGNOSIS — E785 Hyperlipidemia, unspecified: Secondary | ICD-10-CM | POA: Diagnosis present

## 2017-11-09 DIAGNOSIS — Z1612 Extended spectrum beta lactamase (ESBL) resistance: Secondary | ICD-10-CM | POA: Diagnosis present

## 2017-11-09 DIAGNOSIS — R443 Hallucinations, unspecified: Secondary | ICD-10-CM | POA: Diagnosis not present

## 2017-11-09 DIAGNOSIS — Z79899 Other long term (current) drug therapy: Secondary | ICD-10-CM | POA: Diagnosis not present

## 2017-11-09 DIAGNOSIS — G301 Alzheimer's disease with late onset: Secondary | ICD-10-CM | POA: Diagnosis not present

## 2017-11-09 DIAGNOSIS — F0281 Dementia in other diseases classified elsewhere with behavioral disturbance: Secondary | ICD-10-CM | POA: Diagnosis not present

## 2017-11-09 LAB — BASIC METABOLIC PANEL
ANION GAP: 8 (ref 5–15)
BUN: 16 mg/dL (ref 6–20)
CALCIUM: 9.5 mg/dL (ref 8.9–10.3)
CO2: 23 mmol/L (ref 22–32)
Chloride: 112 mmol/L — ABNORMAL HIGH (ref 101–111)
Creatinine, Ser: 0.86 mg/dL (ref 0.44–1.00)
GLUCOSE: 109 mg/dL — AB (ref 65–99)
Potassium: 3.6 mmol/L (ref 3.5–5.1)
Sodium: 143 mmol/L (ref 135–145)

## 2017-11-09 LAB — CBC
HCT: 40 % (ref 36.0–46.0)
HEMOGLOBIN: 12.8 g/dL (ref 12.0–15.0)
MCH: 28.1 pg (ref 26.0–34.0)
MCHC: 32 g/dL (ref 30.0–36.0)
MCV: 87.9 fL (ref 78.0–100.0)
PLATELETS: 209 10*3/uL (ref 150–400)
RBC: 4.55 MIL/uL (ref 3.87–5.11)
RDW: 13.8 % (ref 11.5–15.5)
WBC: 6.4 10*3/uL (ref 4.0–10.5)

## 2017-11-09 LAB — MRSA PCR SCREENING: MRSA BY PCR: NEGATIVE

## 2017-11-09 MED ORDER — LORAZEPAM 2 MG/ML IJ SOLN
1.0000 mg | Freq: Once | INTRAMUSCULAR | Status: AC
  Administered 2017-11-09: 1 mg via INTRAVENOUS
  Filled 2017-11-09: qty 1

## 2017-11-09 MED ORDER — HYDRALAZINE HCL 20 MG/ML IJ SOLN: 10.0000 mg | INTRAMUSCULAR | Status: DC | PRN

## 2017-11-09 NOTE — Progress Notes (Signed)
PROGRESS NOTE  Cindy [name]  FIE:[id number] DOB: 1941-07-12 DOA: 11/08/2017 PCP: [name], MD  Brief Narrative:   Cindy [name] is a 76 y.o. female with history of dementia, atrial fibrillation, asthma, uterine cancer, CVA, CAD, Barrett's esophagus was brought to the ER after patient was found to be increasingly confused with hallucinations and agitations in the skilled nursing facility for 2 days.  Patient had been eating poorly and not taking her medications.  CT of the head was unremarkable.  CXR negative Her urinalysis was suggestive of urinary tract infection.  Family stated that she is much more confused than usual with a urinary tract infection so MRI brain was ordered.  Assessment & Plan:  1. Acute metabolic encephalopathy, likely due to urinary tract infection in the setting of dementia.  Still very confused and interfering with her ability to eat.    -  Continue ceftriaxone -  Urine culture growing E. coli, sensitivities pending -  MRI brain without acute stroke Dementia with behavioral disturbances  -  Continue Seroquel mirtazapine Zoloft trazodone and BuSpar. -  Family requesting psychiatry consultation, however, will wait to see if mentation clears with treatment of UTI first.  If not improved in another day or two, then will consult psychiatry. 2. A. Fib, rate controlled -on Xarelto and Coreg. - d/c telemetry 3. CAD -on Imdur statins aspirin Coreg and Xarelto. 4. History of asthma -presently not wheezing continue inhalers. 5. Hypothyroidism on Synthroid. 6. Dysphagia, mild aspiration risk.  Speech recommending thin liquids or pureed.  Will order full liquids for now  DVT prophylaxis:  xarelto Code Status:  DNR Family Communication:  Patient alone Disposition Plan:  To SNF in a few days, awaiting further improvement in her confusion.     Consultants:   none  Procedures:  none  Antimicrobials:  Anti-infectives (From admission, onward)   Start      Dose/Rate Route Frequency Ordered Stop   11/09/17 2200  cefTRIAXone (ROCEPHIN) 1 g in dextrose 5 % 50 mL IVPB     1 g 100 mL/hr over 30 Minutes Intravenous Every 24 hours 11/08/17 2135     11/08/17 2000  cefTRIAXone (ROCEPHIN) 1 g in dextrose 5 % 50 mL IVPB     1 g 100 mL/hr over 30 Minutes Intravenous  Once 11/08/17 1904 11/08/17 2218       Subjective:  Has been feeling like she has to void a lot recently.  Denies abdominal pain and back pain.  History limited by confusion.    Objective: Vitals:   11/08/17 2326 11/09/17 0649 11/09/17 1143 11/09/17 1327  BP:  134/85  (!) 173/84  Pulse:  86  (!) 56  Resp:    16  Temp:    97.7 F (36.5 C)  TempSrc:    Axillary  SpO2:   95% 95%  Weight: 67.2 kg (148 lb 2.4 oz)     Height: 5' (1.524 m)       Intake/Output Summary (Last 24 hours) at 11/09/2017 1636 Last data filed at 11/09/2017 1330 Gross per 24 hour  Intake 290 ml  Output 0 ml  Net 290 ml   Filed Weights   11/08/17 2326  Weight: 67.2 kg (148 lb 2.4 oz)    Examination:  General exam:  Adult female, sitting up in bed and having difficulty following instructions from speech therapist who is encouraging her to have sips from a straw.  No acute distress.  HEENT:  NCAT, MMM Respiratory system:  Clear to auscultation bilaterally Cardiovascular system: Regular rate and rhythm, normal S1/S2. No murmurs, rubs, gallops or clicks.  Warm extremities Gastrointestinal system: Normal active bowel sounds, soft, nondistended, nontender. MSK:  Normal tone and bulk, no lower extremity edema Neuro:  Grossly moves all extremities Psych: Oriented to person only    Data Reviewed: I have personally reviewed following labs and imaging studies  CBC: Recent Labs  Lab 11/08/17 1512 11/09/17 0822  WBC 7.0 6.4  NEUTROABS 5.4  --   HGB 13.8 12.8  HCT 42.8 40.0  MCV 87.2 87.9  PLT 233 086   Basic Metabolic Panel: Recent Labs  Lab 11/08/17 1512 11/09/17 0822  NA 141 143  K 3.7 3.6    CL 105 112*  CO2 26 23  GLUCOSE 150* 109*  BUN 20 16  CREATININE 0.94 0.86  CALCIUM 9.9 9.5   GFR: Estimated Creatinine Clearance: 46.1 mL/min (by C-G formula based on SCr of 0.86 mg/dL). Liver Function Tests: Recent Labs  Lab 11/08/17 1512  AST 18  ALT 20  ALKPHOS 64  BILITOT 0.9  PROT 6.9  ALBUMIN 3.8   No results for input(s): LIPASE, AMYLASE in the last 168 hours. No results for input(s): AMMONIA in the last 168 hours. Coagulation Profile: No results for input(s): INR, PROTIME in the last 168 hours. Cardiac Enzymes: No results for input(s): CKTOTAL, CKMB, CKMBINDEX, TROPONINI in the last 168 hours. BNP (last 3 results) No results for input(s): PROBNP in the last 8760 hours. HbA1C: No results for input(s): HGBA1C in the last 72 hours. CBG: No results for input(s): GLUCAP in the last 168 hours. Lipid Profile: No results for input(s): CHOL, HDL, LDLCALC, TRIG, CHOLHDL, LDLDIRECT in the last 72 hours. Thyroid Function Tests: Recent Labs    11/08/17 1512  TSH 1.680   Anemia Panel: No results for input(s): VITAMINB12, FOLATE, FERRITIN, TIBC, IRON, RETICCTPCT in the last 72 hours. Urine analysis:    Component Value Date/Time   COLORURINE AMBER (A) 11/08/2017 1501   APPEARANCEUR HAZY (A) 11/08/2017 1501   LABSPEC 1.020 11/08/2017 1501   PHURINE 5.0 11/08/2017 1501   GLUCOSEU NEGATIVE 11/08/2017 1501   HGBUR SMALL (A) 11/08/2017 1501   BILIRUBINUR NEGATIVE 11/08/2017 1501   KETONESUR NEGATIVE 11/08/2017 1501   PROTEINUR 100 (A) 11/08/2017 1501   UROBILINOGEN 0.2 01/02/2015 1313   NITRITE NEGATIVE 11/08/2017 1501   LEUKOCYTESUR TRACE (A) 11/08/2017 1501   Sepsis Labs: @LABRCNTIP (procalcitonin:4,lacticidven:4)  ) Recent Results (from the past 240 hour(s))  Urine culture     Status: Abnormal (Preliminary result)   Collection Time: 11/08/17  3:01 PM  Result Value Ref Range Status   Specimen Description URINE, CATHETERIZED  Final   Special Requests NONE   Final   Culture (A)  Final    >=100,000 COLONIES/mL ESCHERICHIA COLI SUSCEPTIBILITIES TO FOLLOW Performed at [facility], [place] [address] [place], [place] [address]    Report Status PENDING  Incomplete  MRSA PCR Screening     Status: None   Collection Time: 11/08/17 11:40 PM  Result Value Ref Range Status   MRSA by PCR NEGATIVE NEGATIVE Final    Comment:        The GeneXpert MRSA Assay (FDA approved for NASAL specimens only), is one component of a comprehensive MRSA colonization surveillance program. It is not intended to diagnose MRSA infection nor to guide or monitor treatment for MRSA infections.       Radiology Studies: Ct Head Wo Contrast  Result Date: 11/08/2017  CLINICAL DATA:  Altered level of consciousness. EXAM: CT HEAD WITHOUT CONTRAST TECHNIQUE: Contiguous axial images were obtained from the base of the skull through the vertex without intravenous contrast. COMPARISON:  CT scan of August 25, 2016. FINDINGS: Brain: Old left frontal and right posterior parietal infarctions are noted. No mass effect or midline shift is noted. Ventricular size is within normal limits. There is no evidence of mass lesion, hemorrhage or acute infarction. Vascular: No hyperdense vessel or unexpected calcification. Skull: No fracture or other bony abnormality is noted. Fluid is noted in right mastoid air cells. Sinuses/Orbits: No acute finding. Other: None. IMPRESSION: Old left frontal and right posterior parietal infarctions. No acute intracranial abnormality seen. Electronically Signed   By: Marijo Conception, M.D.   On: 11/08/2017 16:57   Mr Brain Wo Contrast  Result Date: 11/09/2017 CLINICAL DATA:  75 y/o female with increasing confusion, agitation, hallucination. Chronic right ICA occlusion. EXAM: MRI HEAD WITHOUT CONTRAST TECHNIQUE: Multiplanar, multiecho pulse sequences of the brain and surrounding structures were obtained without intravenous contrast. COMPARISON:  CT head without  contrast 11/08/2017, CTA neck 08/26/2016, and earlier. FINDINGS: Brain: Chronic infarcts with encephalomalacia in the left superior frontal gyrus, right parietal lobe. Mild associated hemosiderin. Superimposed Patchy and confluent bilateral cerebral white matter T2 and FLAIR hyperintensity. Mild for age T2 heterogeneity in the bilateral deep gray matter nuclei. T2 hyperintensity in the pons greater on the right might reflect Wallerian degeneration. Cerebellum is within normal limits. No convincing restricted diffusion to suggest acute infarction. No midline shift, mass effect, evidence of mass lesion, ventriculomegaly, extra-axial collection or acute intracranial hemorrhage. Cervicomedullary junction and pituitary are within normal limits. Vascular: Loss of the right ICA flow void in the upper neck and siphon. Evidence of reconstituted flow at the right ICA terminus, probably via a right posterior communicating artery. Other Major intracranial vascular flow voids are preserved with mild generalized intracranial artery tortuosity. Skull and upper cervical spine: Negative visualized cervical spine. Visualized bone marrow signal is within normal limits. Sinuses/Orbits: Postoperative changes to both globes, otherwise negative orbits soft tissues. Paranasal sinuses are clear. Other: Chronic right mastoid effusion. Trace left mastoid fluid also appears chronic. Negative nasopharynx. Visible internal auditory structures appear normal. Scalp and face soft tissues appear negative. IMPRESSION: 1.  No acute intracranial abnormality. 2. Chronic right carotid occlusion. Chronic moderate-size cortical infarcts in the left frontal and right parietal lobes. 3. Moderately advanced for age bilateral cerebral white matter signal changes, most commonly due to chronic small vessel disease. Possible Wallerian degeneration in the right brainstem. 4. Chronic right mastoid effusion. Electronically Signed   By: Genevie Ann M.D.   On: 11/09/2017  08:16   Dg Chest Port 1 View  Result Date: 11/09/2017 CLINICAL DATA:  Acute onset of confusion.  Encephalopathy. EXAM: PORTABLE CHEST 1 VIEW COMPARISON:  Chest radiograph performed 08/25/2016 FINDINGS: The lungs are well-aerated and clear. There is no evidence of focal opacification, pleural effusion or pneumothorax. The cardiomediastinal silhouette is within normal limits. No acute osseous abnormalities are seen. There is chronic deformity of the proximal right humerus. IMPRESSION: No acute cardiopulmonary process seen. Electronically Signed   By: Garald Balding M.D.   On: 11/09/2017 06:46     Scheduled Meds: . aspirin EC  81 mg Oral Daily  . busPIRone  10 mg Oral TID  . carvedilol  3.125 mg Oral BID WC  . cholecalciferol  1,000 Units Oral Daily  . dicyclomine  10 mg Oral TID AC  . feeding  supplement (ENSURE ENLIVE)  237 mL Oral TID BM  . isosorbide mononitrate  30 mg Oral Daily  . levothyroxine  25 mcg Oral QAC breakfast  . loratadine  10 mg Oral Daily  . mirtazapine  7.5 mg Oral QHS  . mometasone-formoterol  2 puff Inhalation BID  . pantoprazole  40 mg Oral Daily  . QUEtiapine  100 mg Oral Daily  . QUEtiapine  50 mg Oral QHS  . Rivaroxaban  15 mg Oral Daily  . sertraline  50 mg Oral QHS  . simvastatin  10 mg Oral QHS  . tiotropium  18 mcg Inhalation Daily  . traZODone  150 mg Oral QHS  . traZODone  25 mg Oral QHS   Continuous Infusions: . cefTRIAXone (ROCEPHIN)  IV       LOS: 0 days    Time spent: 30 min    Janece Canterbury, MD [facility] Pager [phone]  If 7PM-7AM, please contact night-coverage [email or web address] Password [facility] 11/09/2017, 4:36 PM

## 2017-11-09 NOTE — Evaluation (Signed)
Clinical/Bedside Swallow Evaluation Patient Details  Name: Cindy [name] MRN: [id number] Date of Birth: 1941/04/26  Today's Date: 11/09/2017 Time: SLP Start Time (ACUTE ONLY): 1106 SLP Stop Time (ACUTE ONLY): 1125 SLP Time Calculation (min) (ACUTE ONLY): 19 min  Past Medical History:  Past Medical History:  Diagnosis Date  . A-fib ([place]) 01/07/2012  . ANEMIA 07/29/2010   Qualifier: Diagnosis of  By: Bobby Rumpf CMA (AAMA), [place]    . Angina pectoris ([place])   . Anxiety   . Asthma   . Asthma 01/07/2012  . Barrett's esophagus   . Bronchitis   . Cancer ([place])    uterian  . Chest pain   . Coronary artery disease    right carotid occlusion   . Dementia with behavioral disturbance 09/02/2016  . Depression   . Dysrhythmia    atrial fibrillation  . GERD (gastroesophageal reflux disease)   . 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 Hyperlipidemia   . Hypertension   . Mood disorder ([place])   . Paranoia ([place]) 10/03/2016  . Recurrent upper respiratory infection (URI)   . Stroke [facility])    2007, after left endarterectomy   Past Surgical History:  Past Surgical History:  Procedure Laterality Date  . ABDOMINAL HYSTERECTOMY    . BALLOON DILATION  10/18/2012   Procedure: BALLOON DILATION;  Surgeon: Lear Ng, MD;  Location: [facility] END[facility]COPY;  Service: Endoscopy;  Laterality: N/A;  . CARDIAC CATHETERIZATION    . CAROTID ENDARTERECTOMY     left, complicated by stroke  . CHOLECYSTECTOMY     incidental at time of colectomy  . COLONOSCOPY  12/01/2011   Procedure: COLONOSCOPY;  Surgeon: Lear Ng, MD;  Location: [facility]OS[facility];  Service: Endoscopy;  Laterality: N/A;  . COLONOSCOPY  10/18/2012   Procedure: COLONOSCOPY;  Surgeon: Lear Ng, MD;  Location: [facility];  Service: Endoscopy;  Laterality: N/A;  colonic dilation  . ESOPHAGOGASTRODUODENOSCOPY  12/01/2011   Procedure: ESOPHAGOGASTRODUODENOSCOPY (EGD);  Surgeon: Lear Ng, MD;  Location: [facility];   Service: Endoscopy;  Laterality: N/A;  . HOT HEMOSTASIS  12/01/2011   Procedure: HOT HEMOSTASIS (ARGON PLASMA COAGULATION/BICAP);  Surgeon: Lear Ng, MD;  Location: [facility];  Service: Endoscopy;  Laterality: N/A;  . 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  Swallow eval ordered.  Pt last endoscopy 11/2011 in Epic showed distal esophageal circumferential erythema and mildly stenotic GEJ, small HH, no Barrett's.     Assessment / Plan / Recommendation Clinical Impression  Pt with altered mentation at this time but still accepting of po intake *cranberry juice via straw and 2 small boluses of applesauce*.  SLP had pt hold her own cup for maximal participation/neuro input.  No indications of aspiration with minimal po consumed but suspect delayed swallow initiation. Pt accepted only minimal amount of applesauce, anticipate swallow dysfunction is cognitive related and will resolve with improved mentation.  Suspect she will take in only liquids and purees in limited amounts at this time.   Will follow up for po tolerance, education, dysphagia management.  Thanks for consult.  SLP Visit Diagnosis: Dysphagia, oral phase (R13.11)    Aspiration Risk  Mild aspiration risk  Diet Recommendation Thin liquid;Other (Comment)(anticipate will only take in small amounts of liquids/purees due to AMS)   Liquid Administration via: Cup;Straw Medication Administration: Whole meds with puree Supervision: Comment(encourage pt to help self feed) Compensations: Minimize environmental distractions;Slow rate;Small sips/bites Postural Changes: Seated upright at 90 degrees;Remain upright for at least 30 minutes  after po intake    Other  Recommendations Oral Care Recommendations: Oral care BID   Follow up Recommendations        Frequency and Duration min 1 x/week          Prognosis Prognosis for Safe Diet Advancement: Fair      Swallow Study   General Date of Onset: 11/09/17 HPI: 76 yo female adm to [facility] with AMS, found to have left frontal/parietal cortical cvas, h/o uterine cancer, asthma, afib, cad, barrett's esophagus, paranoia, cva s/p left cea 2007, anxiety, gerd.  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  Swallow eval ordered.   Type of Study: Bedside Swallow Evaluation Diet Prior to this Study: Regular;Thin liquids Temperature Spikes Noted: No Respiratory Status: Room air History of Recent Intubation: No Behavior/Cognition: Lethargic/Drowsy Oral Cavity - Dentition: Other (Comment);Missing dentition(dentures present, slp cleaned and offered to help pt place in mouth - she declined stating she used to wear them when she ate but not anymore) Self-Feeding Abilities: Needs assist Patient Positioning: Upright in bed Baseline Vocal Quality: Normal Volitional Cough: Cognitively unable to elicit Volitional Swallow: Unable to elicit    Oral/Motor/Sensory Function Overall Oral Motor/Sensory Function: Other (comment)(pt did not follow directions but able to seal lips on spoon and straw adequately)   Ice Chips Ice chips: Not tested Other Comments: no ice available on the floor   Thin Liquid Thin Liquid: Impaired Presentation: Self Fed;Straw Pharyngeal  Phase Impairments: Suspected delayed Swallow(audible swallow)    Nectar Thick Nectar Thick Liquid: Not tested   Honey Thick Honey Thick Liquid: Not tested   Puree Puree: Impaired Presentation: Spoon Other Comments: pt accepted only minimal amount of applesauce, suspect delay swallow but not indications of airway compromise nor oral residuals per slp observation   Solid   GO   Solid: Not tested Other Comments: pt  did not allow slp to place dentures and she is currently dysarthric    Functional Assessment Tool Used: clinical judgement Functional Limitations: Swallowing Swallow Current Status (F1638): At least 60 percent but less than 80 percent impaired, limited or restricted Swallow Goal Status [id number]): At least 40 percent but less than 60 percent impaired, limited or restricted   Cindy [name] 11/09/2017,11:43 AM  Luanna Salk, [place] [facility] SLP [phone]

## 2017-11-09 NOTE — Care Management Note (Signed)
Case Management Note  Patient Details  Name: ZYRIAH MASK MRN: [id number] Date of Birth: 1941/07/13  Subjective/Objective:                  patient is found to be confused and had required 1 dose of Ativan for the agitation.  CT of the head was unremarkable.  UA is compatible with UTI.  ER physician had discussed with on-call behavioral health team who at this time requested to treat UTI and if it does not get better to reconsult psychiatry.     Action/Plan: Date: November 09, 2017 Velva Harman, BSN, [place], [place] Chart and notes review for patient progress and needs. Will follow for case management and discharge needs. Next review date: [id number]  Expected Discharge Date:  (unknown)               Expected Discharge Plan:  Home/Self Care  In-House Referral:     Discharge planning Services  CM Consult  Post Acute Care Choice:    Choice offered to:     DME Arranged:    DME Agency:     HH Arranged:    HH Agency:     Status of Service:  In process, will continue to follow  If discussed at Long Length of Stay Meetings, dates discussed:    Additional Comments:  Leeroy Cha, RN 11/09/2017, 8:44 AM

## 2017-11-09 NOTE — Evaluation (Signed)
SLP Cancellation Note  Patient Details Name: Cindy [name] MRN: [id number] DOB: Feb 22, 1941   Cancelled treatment:       Reason Eval/Treat Not Completed: Other (comment)(pt awakens briefly to stimulation, unintelligible speech and falls back to sleep, will continue efforts)   Macario Golds 11/09/2017, 11:04 AM   Luanna Salk, [place] [facility] SLP [phone]

## 2017-11-10 DIAGNOSIS — Z87891 Personal history of nicotine dependence: Secondary | ICD-10-CM

## 2017-11-10 DIAGNOSIS — R410 Disorientation, unspecified: Secondary | ICD-10-CM

## 2017-11-10 DIAGNOSIS — R443 Hallucinations, unspecified: Secondary | ICD-10-CM

## 2017-11-10 DIAGNOSIS — G301 Alzheimer's disease with late onset: Secondary | ICD-10-CM

## 2017-11-10 DIAGNOSIS — R4182 Altered mental status, unspecified: Secondary | ICD-10-CM

## 2017-11-10 DIAGNOSIS — F0281 Dementia in other diseases classified elsewhere with behavioral disturbance: Secondary | ICD-10-CM

## 2017-11-10 DIAGNOSIS — G4700 Insomnia, unspecified: Secondary | ICD-10-CM

## 2017-11-10 LAB — URINE CULTURE: Culture: >=100000 — AB

## 2017-11-10 MED ORDER — SODIUM CHLORIDE 0.9 % IV SOLN
1.0000 g | INTRAVENOUS | Status: DC
  Administered 2017-11-10: 1 g via INTRAVENOUS
  Filled 2017-11-10: qty 1

## 2017-11-10 NOTE — Consult Note (Signed)
[facility] Face-to-Face Psychiatry Consult   Reason for Consult:  Psychosis Referring Physician:  Dr. Sheran Fava Patient Identification: Cindy [name] MRN:  [id number] Principal Diagnosis: Altered mental status Diagnosis:   Patient Active Problem List   Diagnosis Date Noted  . Acute metabolic encephalopathy [S16.83] 11/09/2017  . Mania ([place]) [F30.9] 06/21/2017  . Hypomagnesemia [E83.42] 06/19/2017  . Essential hypertension [I10] 06/18/2017  . Acute encephalopathy [G93.40] 06/17/2017  . Acute lower UTI [N39.0] 06/17/2017  . E. coli UTI [N39.0, B96.20] 06/17/2017  . Depression [F32.9] 04/24/2017  . Hypothyroidism [E03.9] 10/03/2016  . Paranoia ([place]) [F22] 10/03/2016  . LOC (loss of consciousness) ([place]) [R40.20] 09/02/2016  . 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 Dementia with behavioral disturbance [F03.91] 09/02/2016  . Asthma with acute exacerbation [J45.901] 08/21/2016  . Atrial fibrillation with RVR ([place]) [I48.91] 08/21/2016  . Elevated INR [R79.1] 08/21/2016  . Acute on chronic systolic CHF (congestive heart failure) ([place]) [I50.23] 08/21/2016  . Hyperlipidemia [E78.5] 08/21/2016  . Electrolyte abnormality [E87.8] 08/21/2016  . HCAP (healthcare-associated pneumonia) [J18.9] 08/10/2016  . Sepsis ([place]) [A41.9] 08/09/2016  . Coronary atherosclerosis of native coronary artery [I25.10] 01/21/2014  . Obesity [E66.9] 01/17/2014  . Abdominal pain, generalized [R10.84] 10/18/2012  . GERD (gastroesophageal reflux disease) [K21.9]   . Mood disorder ([place]) [F39]   . Anxiety [F41.9]   . 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[place]) [I11.0]   . Stroke [facility]) [I63.9]   . Chest pain [786.5] 01/07/2012  . Asthma [J45.909] 01/07/2012  . A-fib ([place]) [I48.91] 01/07/2012  . History of CVA with residual deficit [I69.30] 01/07/2012  . Anemia [D64.9] 01/07/2012  . ANEMIA [D64.9] 07/29/2010  . BARRETTS ESOPHAGUS [K22.70] 06/04/2010    Total Time spent with  patient: 1 hour  Subjective:   Cindy [name] is a 76 y.o. female patient admitted with acute metabolic encephalopathy secondary to UTI.  HPI:  Per chart review, patient has a history of dementia. She was admitted for encephalopathy secondary to UTI. She was increasingly confused with hallucinations and agitation at her SNF for 2 days prior to admission. She was eating poorly and refusing her medications. She was given Ativan for agitation in the ED. TTS consult was placed on 12/11. According to note, she had been experiencing hallucinations for the past week. She was confused and disoriented during the interview. Her speech was tangential.  She was previously admitted to the hospital in July 2018 for a similar presentation in the setting of a UTI. Home medications include Buspar 10 mg TID, Remeron 7.5 mg qhs, Zoloft 50 mg qhs and Seroquel 100 mg q am and 50 mg qhs. She was given Ativan (x 1 on 12/11 and 12/12) for agitation.   On interview, Cindy [name] is disorganized in thought process and behavior. She reports today's date as 05/12/1941. She endorses paranoid thoughts. When asked about the plan for discharge she reports, "I'll be dead. It's planned for." She denies SI or HI. She reports that she is not sleeping well because she is scared to move. She reports if she goes to sleep she may get killed tonight. She reports that there was a girl that "you all killed." She asked this notewriter to talk in a quiet voice so "they can't hear you." She reports that she has a grandchild "out there." She reports AH but does not want to discuss them. When it was time for the notewriter to leave she reported, "Don't let them get me tonight."   Past  Psychiatric History: Dementia with behavioral disturbance, anxiety and depression.   Risk to Self: Suicidal Ideation: No(pt denies ) Suicidal Intent: No Is patient at risk for suicide?: No Suicidal Plan?: No Access to Means: No What has been your use of drugs/alcohol  within the last 12 months?: none reported  Triggers for Past Attempts: None known Intentional Self Injurious Behavior: None Risk to Others: Homicidal Ideation: No Thoughts of Harm to Others: No-Not Currently Present/Within Last 6 Months Current Homicidal Intent: No Current Homicidal Plan: No Access to Homicidal Means: No History of harm to others?: Yes Assessment of Violence: On admission Violent Behavior Description: pt reportedly has been aggressive with nursing home staff and residents  Does patient have access to weapons?: No Criminal Charges Pending?: No Does patient have a court date: No Prior Inpatient Therapy: Prior Inpatient Therapy: (UTA due to AMS) Prior Outpatient Therapy: Prior Outpatient Therapy: (UTA due to AMS) Does patient have an ACCT team?: No Does patient have Intensive In-House Services?  : No Does patient have [facility] services? : No Does patient have P4CC services?: No  Past Medical History:  Past Medical History:  Diagnosis Date  . A-fib ([place]) 01/07/2012  . ANEMIA 07/29/2010   Qualifier: Diagnosis of  By: Bobby Rumpf CMA (AAMA), [place]    . Angina pectoris ([place])   . Anxiety   . Asthma   . Asthma 01/07/2012  . Barrett's esophagus   . Bronchitis   . Cancer ([place])    uterian  . Chest pain   . Coronary artery disease    right carotid occlusion   . Dementia with behavioral disturbance 09/02/2016  . Depression   . Dysrhythmia    atrial fibrillation  . GERD (gastroesophageal reflux disease)   . 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 Hyperlipidemia   . Hypertension   . Mood disorder ([place])   . Paranoia ([place]) 10/03/2016  . Recurrent upper respiratory infection (URI)   . Stroke [facility])    2007, after left endarterectomy    Past Surgical History:  Procedure Laterality Date  . ABDOMINAL HYSTERECTOMY    . BALLOON DILATION  10/18/2012   Procedure: BALLOON DILATION;  Surgeon: Lear Ng, MD;  Location: [facility] ENDOSCOPY;  Service: Endoscopy;  Laterality:  N/A;  . CARDIAC CATHETERIZATION    . CAROTID ENDARTERECTOMY     left, complicated by stroke  . CHOLECYSTECTOMY     incidental at time of colectomy  . COLONOSCOPY  12/01/2011   Procedure: COLONOSCOPY;  Surgeon: Lear Ng, MD;  Location: [facility] ENDOSCOPY;  Service: Endoscopy;  Laterality: N/A;  . COLONOSCOPY  10/18/2012   Procedure: COLONOSCOPY;  Surgeon: Lear Ng, MD;  Location: [facility] ENDOSCOPY;  Service: Endoscopy;  Laterality: N/A;  colonic dilation  . ESOPHAGOGASTRODUODENOSCOPY  12/01/2011   Procedure: ESOPHAGOGASTRODUODENOSCOPY (EGD);  Surgeon: Lear Ng, MD;  Location: [facility];  Service: Endoscopy;  Laterality: N/A;  . HOT HEMOSTASIS  12/01/2011   Procedure: HOT HEMOSTASIS (ARGON PLASMA COAGULATION/BICAP);  Surgeon: Lear Ng, MD;  Location: [facility];  Service: Endoscopy;  Laterality: N/A;  . 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 Heart disease Mother   . Cancer Mother   . Heart attack Father   . Cancer Sister        ovarian   Family Psychiatric  History: UTA due to altered mental status. Social History:  Social History   Substance and Sexual Activity  Alcohol Use No     Social History   Substance and Sexual Activity  Drug Use No    Social History   Socioeconomic History  . Marital status: Divorced    Spouse name: None  . Number of children: None  . Years of education: None  . Highest education level: None  Social Needs  . Financial resource strain: None  . Food insecurity - worry: None  . Food insecurity - inability: None  . Transportation needs - medical: None  . Transportation needs - non-medical: None  Occupational History  . Occupation: retired Freight forwarder  Tobacco Use  . Smoking status: Former Smoker    Last attempt to quit: 04/09/2003    Years since quitting: 14.6  . Smokeless tobacco: Never Used  Substance  and Sexual Activity  . Alcohol use: No  . Drug use: No  . Sexual activity: No  Other Topics Concern  . None  Social History Narrative   Admitted to [facility] 08/16/16   Divorced   Former smoker-stopped 2004   Alcohol none   DNR   Additional Social History: She denies alcohol, illicit substance or tobacco use. She was a former smoker. She used to work at [employer].     Allergies:  No Known Allergies  Labs:  Results for orders placed or performed during the hospital encounter of 11/08/17 (from the past 48 hour(s))  Urinalysis, Routine w reflex microscopic     Status: Abnormal   Collection Time: 11/08/17  3:01 PM  Result Value Ref Range   Color, Urine AMBER (A) YELLOW    Comment: BIOCHEMICALS MAY BE AFFECTED BY COLOR   APPearance HAZY (A) CLEAR   Specific Gravity, Urine 1.020 1.005 - 1.030   pH 5.0 5.0 - 8.0   Glucose, UA NEGATIVE NEGATIVE mg/dL   Hgb urine dipstick SMALL (A) NEGATIVE   Bilirubin Urine NEGATIVE NEGATIVE   Ketones, ur NEGATIVE NEGATIVE mg/dL   Protein, ur 100 (A) NEGATIVE mg/dL   Nitrite NEGATIVE NEGATIVE   Leukocytes, UA TRACE (A) NEGATIVE   RBC / HPF 0-5 0 - 5 RBC/hpf   WBC, UA 6-30 0 - 5 WBC/hpf   Bacteria, UA MANY (A) NONE SEEN   Squamous Epithelial / LPF 0-5 (A) NONE SEEN  Rapid urine drug screen (hospital performed)     Status: None   Collection Time: 11/08/17  3:01 PM  Result Value Ref Range   Opiates NONE DETECTED NONE DETECTED   Cocaine NONE DETECTED NONE DETECTED   Benzodiazepines NONE DETECTED NONE DETECTED   Amphetamines NONE DETECTED NONE DETECTED   Tetrahydrocannabinol NONE DETECTED NONE DETECTED   Barbiturates NONE DETECTED NONE DETECTED    Comment:        DRUG SCREEN FOR MEDICAL PURPOSES ONLY.  IF CONFIRMATION IS NEEDED FOR ANY PURPOSE, NOTIFY LAB WITHIN 5 DAYS.        LOWEST DETECTABLE LIMITS FOR URINE DRUG SCREEN Drug Class       Cutoff (ng/mL) Amphetamine      1000 Barbiturate      200 Benzodiazepine    578 Tricyclics       469 Opiates          300 Cocaine          300 THC              50   Urine culture     Status: Abnormal   Collection Time: 11/08/17  3:01 PM  Result Value Ref Range   Specimen Description URINE, CATHETERIZED    Special Requests NONE    Culture (A)     >=100,000 COLONIES/mL ESCHERICHIA COLI Confirmed Extended Spectrum Beta-Lactamase Producer (ESBL).  In bloodstream infections from ESBL organisms, carbapenems are preferred over piperacillin/tazobactam. They are shown to have a lower risk of mortality. Performed at [facility], [place] [address], [place], [place] [address]    Report Status 11/10/2017 FINAL    Organism ID, Bacteria ESCHERICHIA COLI (A)       Susceptibility   Escherichia coli - MIC*    AMPICILLIN >=32 RESISTANT Resistant     CEFAZOLIN >=64 RESISTANT Resistant     CEFTRIAXONE >=64 RESISTANT Resistant     CIPROFLOXACIN >=4 RESISTANT Resistant     GENTAMICIN <=1 SENSITIVE Sensitive     IMIPENEM <=0.25 SENSITIVE Sensitive     NITROFURANTOIN <=16 SENSITIVE Sensitive     TRIMETH/SULFA <=20 SENSITIVE Sensitive     AMPICILLIN/SULBACTAM >=32 RESISTANT Resistant     PIP/TAZO >=128 RESISTANT Resistant     Extended ESBL POSITIVE Resistant     * >=100,000 COLONIES/mL ESCHERICHIA COLI  CBC with Differential     Status: None   Collection Time: 11/08/17  3:12 PM  Result Value Ref Range   WBC 7.0 4.0 - 10.5 K/uL   RBC 4.91 3.87 - 5.11 MIL/uL   Hemoglobin 13.8 12.0 - 15.0 g/dL   HCT 42.8 36.0 - 46.0 %   MCV 87.2 78.0 - 100.0 fL   MCH 28.1 26.0 - 34.0 pg   MCHC 32.2 30.0 - 36.0 g/dL   RDW 13.8 11.5 - 15.5 %   Platelets 233 150 - 400 K/uL   Neutrophils Relative % 77 %   Neutro Abs 5.4 1.7 - 7.7 K/uL   Lymphocytes Relative 16 %   Lymphs Abs 1.2 0.7 - 4.0 K/uL   Monocytes Relative 6 %   Monocytes Absolute 0.4 0.1 - 1.0 K/uL   Eosinophils Relative 1 %   Eosinophils Absolute 0.1 0.0 - 0.7 K/uL   Basophils Relative 0 %   Basophils Absolute 0.0 0.0 - 0.1  K/uL  Comprehensive metabolic panel     Status: Abnormal   Collection Time: 11/08/17  3:12 PM  Result Value Ref Range   Sodium 141 135 - 145 mmol/L   Potassium 3.7 3.5 - 5.1 mmol/L   Chloride 105 101 - 111 mmol/L   CO2 26 22 - 32 mmol/L   Glucose, Bld 150 (H) 65 - 99 mg/dL   BUN 20 6 - 20 mg/dL   Creatinine, Ser 0.94 0.44 - 1.00 mg/dL   Calcium 9.9 8.9 - 10.3 mg/dL   Total Protein 6.9 6.5 - 8.1 g/dL   Albumin 3.8 3.5 - 5.0 g/dL   AST 18 15 - 41 U/L   ALT 20 14 - 54 U/L   Alkaline Phosphatase 64 38 - 126 U/L   Total Bilirubin 0.9 0.3 - 1.2 mg/dL   GFR calc non Af Amer 57 (L) >60 mL/min   GFR calc Af Amer >60 >60 mL/min    Comment: (NOTE) The eGFR has been calculated using the CKD EPI equation. This calculation has not been validated in all clinical situations. eGFR's persistently <60 mL/min signify possible Chronic Kidney Disease.    Anion gap 10 5 - 15  TSH     Status: None   Collection Time: 11/08/17  3:12 PM  Result Value Ref Range   TSH 1.680 0.350 -  4.500 uIU/mL    Comment: Performed by a 3rd Generation assay with a functional sensitivity of <=0.01 uIU/mL.  MRSA PCR Screening     Status: None   Collection Time: 11/08/17 11:40 PM  Result Value Ref Range   MRSA by PCR NEGATIVE NEGATIVE    Comment:        The GeneXpert MRSA Assay (FDA approved for NASAL specimens only), is one component of a comprehensive MRSA colonization surveillance program. It is not intended to diagnose MRSA infection nor to guide or monitor treatment for MRSA infections.   Basic metabolic panel     Status: Abnormal   Collection Time: 11/09/17  8:22 AM  Result Value Ref Range   Sodium 143 135 - 145 mmol/L   Potassium 3.6 3.5 - 5.1 mmol/L   Chloride 112 (H) 101 - 111 mmol/L   CO2 23 22 - 32 mmol/L   Glucose, Bld 109 (H) 65 - 99 mg/dL   BUN 16 6 - 20 mg/dL   Creatinine, Ser 0.86 0.44 - 1.00 mg/dL   Calcium 9.5 8.9 - 10.3 mg/dL   GFR calc non Af Amer >60 >60 mL/min   GFR calc Af Amer >60  >60 mL/min    Comment: (NOTE) The eGFR has been calculated using the CKD EPI equation. This calculation has not been validated in all clinical situations. eGFR's persistently <60 mL/min signify possible Chronic Kidney Disease.    Anion gap 8 5 - 15  CBC     Status: None   Collection Time: 11/09/17  8:22 AM  Result Value Ref Range   WBC 6.4 4.0 - 10.5 K/uL   RBC 4.55 3.87 - 5.11 MIL/uL   Hemoglobin 12.8 12.0 - 15.0 g/dL   HCT 40.0 36.0 - 46.0 %   MCV 87.9 78.0 - 100.0 fL   MCH 28.1 26.0 - 34.0 pg   MCHC 32.0 30.0 - 36.0 g/dL   RDW 13.8 11.5 - 15.5 %   Platelets 209 150 - 400 K/uL    Current Facility-Administered Medications  Medication Dose Route Frequency Provider Last Rate Last Dose  . acetaminophen (TYLENOL) tablet 650 mg  650 mg Oral Q6H PRN Rise Patience, MD       Or  . acetaminophen (TYLENOL) suppository 650 mg  650 mg Rectal Q6H PRN Rise Patience, MD      . albuterol (PROVENTIL) (2.5 MG/3ML) 0.083% nebulizer solution 3 mL  3 mL Inhalation Q6H PRN Rise Patience, MD      . aspirin EC tablet 81 mg  81 mg Oral Daily Rise Patience, MD   81 mg at 11/10/17 3094  . busPIRone (BUSPAR) tablet 10 mg  10 mg Oral TID Rise Patience, MD   10 mg at 11/10/17 0768  . carvedilol (COREG) tablet 3.125 mg  3.125 mg Oral BID WC Rise Patience, MD   3.125 mg at 11/10/17 0881  . cefTRIAXone (ROCEPHIN) 1 g in dextrose 5 % 50 mL IVPB  1 g Intravenous Q24H Eudelia Bunch, RPH   Stopped at 11/09/17 2129  . cholecalciferol (VITAMIN D) tablet 1,000 Units  1,000 Units Oral Daily Rise Patience, MD   1,000 Units at 11/10/17 [phone]  . dicyclomine (BENTYL) capsule 10 mg  10 mg Oral TID AC Rise Patience, MD   10 mg at 11/10/17 5945  . feeding supplement (ENSURE ENLIVE) (ENSURE ENLIVE) liquid 237 mL  237 mL Oral TID BM Rise Patience, MD   [phone]  mL at 11/09/17 2047  . hydrALAZINE (APRESOLINE) injection 10 mg  10 mg Intravenous Q4H PRN Rise Patience, MD      . ipratropium-albuterol (DUONEB) 0.5-2.5 (3) MG/3ML nebulizer solution 3 mL  3 mL Nebulization Q4H PRN Rise Patience, MD      . isosorbide mononitrate (IMDUR) 24 hr tablet 30 mg  30 mg Oral Daily Rise Patience, MD   30 mg at 11/10/17 0930  . levothyroxine (SYNTHROID, LEVOTHROID) tablet 25 mcg  25 mcg Oral QAC breakfast Rise Patience, MD   25 mcg at 11/10/17 0930  . loratadine (CLARITIN) tablet 10 mg  10 mg Oral Daily Rise Patience, MD   10 mg at 11/10/17 0930  . mirtazapine (REMERON) tablet 7.5 mg  7.5 mg Oral QHS Rise Patience, MD   7.5 mg at 11/09/17 2046  . mometasone-formoterol (DULERA) 200-5 MCG/ACT inhaler 2 puff  2 puff Inhalation BID Rise Patience, MD   2 puff at 11/10/17 7035  . nitroGLYCERIN (NITROSTAT) SL tablet 0.4 mg  0.4 mg Sublingual Q5 min PRN Rise Patience, MD      . ondansetron [facility]) tablet 4 mg  4 mg Oral Q6H PRN Rise Patience, MD       Or  . ondansetron [facility]) injection 4 mg  4 mg Intravenous Q6H PRN Rise Patience, MD      . pantoprazole (PROTONIX) EC tablet 40 mg  40 mg Oral Daily Rise Patience, MD   40 mg at 11/10/17 0931  . polyethylene glycol (MIRALAX / GLYCOLAX) packet 17 g  17 g Oral Daily PRN Rise Patience, MD      . QUEtiapine (SEROQUEL) tablet 100 mg  100 mg Oral Daily Rise Patience, MD   100 mg at 11/10/17 0931  . QUEtiapine (SEROQUEL) tablet 50 mg  50 mg Oral QHS Rise Patience, MD   50 mg at 11/09/17 2046  . Rivaroxaban (XARELTO) tablet 15 mg  15 mg Oral Daily Rise Patience, MD   15 mg at 11/10/17 0931  . sertraline (ZOLOFT) tablet 50 mg  50 mg Oral QHS Rise Patience, MD   50 mg at 11/09/17 2047  . simvastatin (ZOCOR) tablet 10 mg  10 mg Oral QHS Rise Patience, MD   10 mg at 11/09/17 2047  . tiotropium (SPIRIVA) inhalation capsule 18 mcg  18 mcg Inhalation Daily Rise Patience, MD   18 mcg at 11/10/17 0093  . traZODone (DESYREL) tablet  150 mg  150 mg Oral QHS Rise Patience, MD   150 mg at 11/09/17 2046  . traZODone (DESYREL) tablet 25 mg  25 mg Oral QHS Rise Patience, MD   25 mg at 11/09/17 2048    Musculoskeletal: Strength & Muscle Tone: decreased due to physical deconditioning.  Gait & Station: UTA due to patient lying in bed. Patient leans: N/A  Psychiatric Specialty Exam: Physical Exam  Nursing note and vitals reviewed. Constitutional: She appears well-developed and well-nourished.  HENT:  Head: Normocephalic and atraumatic.  Neck: Normal range of motion.  Respiratory: Effort normal.  Musculoskeletal: Normal range of motion.  Neurological: She is alert.  Oriented to person.  Psychiatric: Her speech is tangential. Thought content is paranoid and delusional. Cognition and memory are impaired. She expresses impulsivity.    Review of Systems  Gastrointestinal: Positive for abdominal pain. Negative for constipation, diarrhea, nausea and vomiting.  Psychiatric/Behavioral: Positive for hallucinations (AH). Negative for substance  abuse and suicidal ideas. The patient has insomnia.     Blood pressure (!) 146/81, pulse 64, temperature 98 F (36.7 C), temperature source Axillary, resp. rate 18, height 5' (1.524 m), weight 67.2 kg (148 lb 2.4 oz), SpO2 97 %.Body mass index is 28.93 kg/m.  General Appearance: Well Groomed, elderly, Caucasian female with short gray hair who is wearing a hospital gown and lying in bed. NAD.  Eye Contact:  Good  Speech:  Normal Rate  Volume:  Normal  Mood:  Anxious  Affect:  Constricted  Thought Process:  Disorganized and Descriptions of Associations: Tangential  Orientation:  Other:  To person.  Thought Content:  Delusions and Paranoid Ideation  Suicidal Thoughts:  No  Homicidal Thoughts:  No  Memory:  Immediate;   Poor Recent;   Poor Remote;   Poor  Judgement:  Impaired  Insight:  Lacking  Psychomotor Activity:  Normal  Concentration:  Concentration: Poor and  Attention Span: Poor  Recall:  Poor  Fund of Knowledge:  Poor  Language:  Fair  Akathisia:  No  Handed:  Right  AIMS (if indicated):   N/A  Assets:  Housing Social Support  ADL's:  Impaired  Cognition:  Impaired secondary to altered mental status and history of dementia.   Sleep:   Poor   Assessment:  Cindy [name] is a 76 y.o. female who was admitted with acute metabolic encephalopathy secondary to UTI. She was admitted to the hospital in July for a similar presentation. She is disorganized in thought process and behavior today. She is only oriented to self and endorses paranoia and delusional thoughts. She is poorly attentive throughout interview. Her presentation is most consistent with delirium caused by a UTI due to her acute mental status change. It is difficult to say how long it will take her delirium to resolve in the setting of an elderly patient that has delirium superimposed on dementia. She will benefit from switching her antipsychotic regimen to help with psychosis and sleep.   Treatment Plan Summary: -Recommend discontinuing Seroquel and starting Zyprexa 2.5 mg BID. Can titrate as needed for psychosis.  -Recommend obtaining a recent EKG as it appears her last EKG was done in 07/2016. -Psychiatry will sign off on patient at this time. Please consult psychiatry again as needed.   Disposition: No evidence of imminent risk to self or others at present.   Patient does not meet criteria for psychiatric inpatient admission.  Faythe Dingwall, DO 11/10/2017 11:52 AM
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
## 2017-11-10 NOTE — Progress Notes (Signed)
  Speech Language Pathology Treatment: Dysphagia  Patient Details Name: Cindy [name] MRN: [id number] DOB: [birth year] Today's Date: 11/10/2017 Time: 3212-2482 SLP Time Calculation (min) (ACUTE ONLY): 29 min  Assessment / Plan / Recommendation Clinical Impression  Pt wit much improved mentation today.  Alert but not following directions today and required total cues to participate in session.  SLP did assist pt to place her dentures and she was able to slowly masticate solids without residuals or indications of airway compromise.  Pt able to feed herself today and orally manipulate water to swish and expectorate for oral care.    Recommend pt have dys3/thin diet (due to lack of lower dentition) with aspiration precautions - encouraging pt to feed herself and use dentures.  Pt did remove her dentures after SlP session - ? If uncomfortable?  Suspect pt is near baseline regarding her swallowing ability.  No SLP follow up indicated.     HPI HPI: 76 yo female adm to [facility] with AMS, found to have left frontal/parietal cortical cvas, h/o uterine cancer, asthma, afib, cad, barrett's esophagus, paranoia, cva s/p left cea 2007, anxiety, gerd.  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  Swallow eval ordered.        SLP Plan  All goals met       Recommendations  Diet recommendations: Dysphagia 3 (mechanical soft);Thin liquid Liquids provided via: Cup;Straw Medication Administration: Whole meds with puree Compensations: Minimize environmental distractions;Slow rate;Small sips/bites Postural Changes and/or Swallow Maneuvers: Seated upright 90 degrees;Upright 30-60 min after meal                Oral Care Recommendations: Oral care BID SLP Visit Diagnosis: Dysphagia, oral phase (R13.11) Plan: All goals met       GO               Luanna Salk, MS [facility] SLP [phone]  Macario Golds 11/10/2017, 11:24 AM

## 2017-11-11 DIAGNOSIS — I482 Chronic atrial fibrillation: Secondary | ICD-10-CM

## 2017-11-11 DIAGNOSIS — G9341 Metabolic encephalopathy: Principal | ICD-10-CM

## 2017-11-11 MED ORDER — OLANZAPINE 2.5 MG PO TABS: 2.5000 mg | ORAL_TABLET | Freq: Two times a day (BID) | ORAL | 0 refills | Status: DC

## 2017-11-11 MED ORDER — FOSFOMYCIN TROMETHAMINE 3 G PO PACK
3.0000 g | PACK | Freq: Once | ORAL | Status: AC
  Administered 2017-11-11: 3 g via ORAL
  Filled 2017-11-11: qty 3

## 2017-11-11 MED ORDER — OLANZAPINE 2.5 MG PO TABS
2.5000 mg | ORAL_TABLET | Freq: Two times a day (BID) | ORAL | Status: DC
  Administered 2017-11-11: 2.5 mg via ORAL
  Filled 2017-11-11 (×2): qty 1

## 2017-11-11 NOTE — Discharge Instructions (Signed)
Delirium Delirium is a state of mental confusion. It comes on quickly and causes significant changes in a person's thinking and behavior. People with delirium usually have trouble paying attention to what is going on or knowing where they are. They may become very withdrawn or very emotional and unable to sit still. They may even see or feel things that are not there (hallucinations). Delirium is a sign of a serious underlying medical condition. What are the causes? Delirium occurs when something suddenly affects the signals that the brain sends out. Brain signals can be affected by anything that puts severe stress on the body and brain and causes brain chemicals to be out of balance. The most common causes of delirium include:  Infections. These may be bacterial, viral, fungal, or protozoal.  Medicines. These include many over-the-counter and prescription medicines.  Recreational drugs.  Substance withdrawal. This occurs with sudden discontinuation of alcohol, certain medicines, or recreational drugs.  Surgery.  Sudden vascular events, such as stroke, brain hemorrhage, and severe migraine.  Other brain disorders, such as tumors, seizures, and physical head trauma.  Metabolic disorders, such as kidney or liver failure.  Low blood oxygen (anoxia). This may occur with lung disease, cardiac arrest, or carbon monoxide poisoning.  Hormone imbalances (endocrinopathies), such as an overactive thyroid (hyperthyroidism) or underactive thyroid (hypothyroidism).  Vitamin deficiencies.  What increases the risk? This condition is more likely to develop in:  Children.  Older people.  People who live alone.  People who have vision loss or hearing loss.  People who have existing brain disease, such as dementia.  People who have long-lasting (chronic) medical conditions, such as heart disease.  People who are hospitalized for long periods of time.  What are the signs or symptoms? Delirium  starts with a sudden change in a person's thinking or behavior. Symptoms come and go (fluctuate) over time, and they are often worse at the end of the day. Symptoms include:  Not being able to stay awake (drowsiness) or pay attention.  Being confused about places, time, and people.  Forgetfulness.  Having extreme energy levels. These may be low or high.  Changes in sleep patterns.  Extreme mood swings, such as anger or anxiety.  Focusing on things or ideas that are not important.  Rambling and senseless talking.  Difficulty speaking, understanding speech, or both.  Hallucinations.  Tremor or unsteady gait.  How is this diagnosed? People with delirium may not realize that they have the condition. Often, a family member or health care provider is the first person to notice the changes. The health care provider will obtain a detailed history of current symptoms, medical issues, medicines, and recreational drug use. The health care provider will perform a mental status examination by:  Asking questions to check for confusion.  Watching for abnormal behavior.  The health care provider may perform a physical exam and order lab tests or additional studies to determine the cause of the delirium. How is this treated? Treatment of delirium depends on the cause and severity. Delirium usually goes away within days or weeks of treating the underlying cause. In the meantime, the person should not be left alone because he or she may accidentally cause self-harm. Treatment includes supportive care, such as:  Increased light during the day and decreased light at night.  Low noise level.  Uninterrupted sleep.  A regular daily schedule.  Clocks and calendars to help with orientation.  Familiar objects, including the person's pictures and clothing.  Frequent visits   from familiar family and friends.  Healthy diet.  Exercise.  In more severe cases of delirium, medicine may be  prescribed to help the person to keep calm and think more clearly. Follow these instructions at home:  Any supportive care should be continued as told by the health care provider.  All medicines should be used as told by the health care provider. This is important.  The health care provider should be consulted before over-the-counter medicines, herbs, or supplements are used.  All follow-up visits should be kept as told by the health care provider. This is important.  Alcohol and recreational drugs should be avoided as told by the health care provider. Contact a health care provider if:  Symptoms do not get better or they become worse.  New symptoms of delirium develop.  Caring for the person at home does not seem safe.  Eating, drinking, or communicating stops.  There are side effects of medicines, such as changes in sleep patterns, dizziness, weight gain, restlessness, movement changes, or tremors. Get help right away if:  Serious thoughts occur about self-harm or about hurting others.  There are serious side effects of medicine, such as: ? Swelling of the face, lips, tongue, or throat. ? Fever, confusion, muscle spasms, or seizures. This information is not intended to replace advice given to you by your health care provider. Make sure you discuss any questions you have with your health care provider. Document Released: 08/09/2012 Document Revised: 04/22/2016 Document Reviewed: 01/08/2015 [employer] Interactive Patient Education  2018 [employer]  

## 2017-11-11 NOTE — Discharge Summary (Addendum)
Physician Discharge Summary  EVANEE LUBRANO ZOX:[id number] DOB: [birth year] DOA: 11/08/2017  PCP: [name], MD  Admit date: 11/08/2017 Discharge date: 11/15/2017  Admitted From: [facility]  Disposition:  [facility]   Recommendations for Outpatient Follow-up:  1. Follow up with PCP in 1-2 weeks:  Unclear if she was supposed to be on PPI.  Stopped for now.  Also, needs reassessment after change to medications, see below.  2. Please check QTc at next visit.  She refused telemetry/ECG here. 3. Changed seroquel to zyprexa at direction of psychiatry 4. Completed treatment for ESBL E. Coli UTI in hospital  Home Health:  PT, OT if cooperative Equipment/Devices:  none  Discharge Condition:  Stable, improved CODE STATUS:  DNR  Diet recommendation:    Diet recommendations: Dysphagia 3 (mechanical soft);Thin liquid Liquids provided via: Cup;Straw Medication Administration: Whole meds with puree Compensations: Minimize environmental distractions;Slow rate;Small sips/bites Postural Changes and/or Swallow Maneuvers: Seated upright 90 degrees;Upright 30-60 min after meal  Brief/Interim Summary:  Frankey Poot a 76 y.o.femalewithhistory of dementia, atrial fibrillation, asthma, uterine cancer, CVA, CAD, Barrett's esophagus was brought to the ER after patient was found to be increasingly confused with hallucinations and agitations in the skilled nursing facility for 2 days. Patient had been eating poorly and not taking her medications.  CT of the head was unremarkable.  CXR negative.  Her urinalysis was suggestive of urinary tract infection.  Family stated that she is much more confused than usual with a urinary tract infection so MRI brain was ordered.  Her MRI demonstrated no evidence of acute stroke.  She seems to have some delusions and remains very confused although she is awake and alert. She was seen by psychiatry who recommended changing her  seroquel to zyprexa, but they did not recommend inpatient geripsych admission at this time.  She was initially started on ceftriaxone, but her urine culture grew ESBL E. Coli.  Her antibiotics were changed to ertapenem and on the day of discharge, she was given a dose of fosfomycin, which should be adequate to treat E. Coli cystitis.    Discharge Diagnoses:  Principal Problem:   Altered mental status Active Problems:   Asthma   A-fib ([place])   Mood disorder (HCC)   Dementia with behavioral disturbance   Hypothyroidism   Acute encephalopathy   Acute lower UTI   Essential hypertension   Acute metabolic encephalopathy   Dysphagia, oropharyngeal  Acute metabolic encephalopathy, likely due to ESBL E. Coli urinary tract infection in the setting of dementia.  Eating some and overall improved.    -  Give 1 dose of fosfomycin 3gm on 12/14 -  MRI brain negative for acute stroke  Dementia with behavioral disturbances, she is more alert and somewhat less confused but still having delusions.  -  Psychiatry was consulted.  They recommended stopping Seroquel and starting zyprexa  -  Continued mirtazapine, Zoloft, trazodone, and BuSpar.  Chronic A. Fib, rate controlled:  Continued Xarelto and Coreg. CAD, stable:  Continued Imdur, statin, aspirin, Coreg Asthma, not wheezing:  Continue inhalers. Hypothyroidism, stable:  Continued Synthroid Dysphagia, oropharyngeal due to hx of CVA, mild aspiration risk:  Speech recommending dysphagia 3 with thin liquids   Discharge Instructions  Discharge Instructions    Increase activity slowly   Complete by:  As directed        Medication List    STOP taking these medications   QUEtiapine 100 MG tablet Commonly known  as:  SEROQUEL     TAKE these medications   acetaminophen 325 MG tablet Commonly known as:  TYLENOL Take 2 tablets (650 mg total) by mouth every 6 (six) hours as needed for mild pain (or Fever >/= 101).   albuterol 108 (90 Base)  MCG/ACT inhaler Commonly known as:  PROVENTIL HFA;VENTOLIN HFA Inhale 2 puffs into the lungs every 6 (six) hours as needed for shortness of breath.   alendronate 70 MG tablet Commonly known as:  FOSAMAX Take 70 mg by mouth every Monday. Take with a full glass of water on an empty stomach.   aspirin EC 81 MG tablet Take 81 mg by mouth daily.   budesonide-formoterol 160-4.5 MCG/ACT inhaler Commonly known as:  SYMBICORT Inhale 2 puffs into the lungs 2 (two) times daily.   busPIRone 10 MG tablet Commonly known as:  BUSPAR Take 10 mg by mouth 3 (three) times daily.   carvedilol 3.125 MG tablet Commonly known as:  COREG Take 1 tablet (3.125 mg total) by mouth 2 (two) times daily with a meal.   dicyclomine 10 MG capsule Commonly known as:  BENTYL Take 10 mg by mouth. Take one capsule three times daily for IBS   feeding supplement (ENSURE ENLIVE) Liqd Take 237 mLs by mouth. Drink once daily due to weight loss   ferrous sulfate 325 (65 FE) MG tablet Take 325 mg by mouth daily with breakfast.   ipratropium-albuterol 0.5-2.5 (3) MG/3ML Soln Commonly known as:  DUONEB Take 3 mLs by nebulization every 4 (four) hours as needed.   isosorbide mononitrate 30 MG 24 hr tablet Commonly known as:  IMDUR Take 30 mg by mouth daily.   levothyroxine 25 MCG tablet Commonly known as:  SYNTHROID, LEVOTHROID Take 25 mcg by mouth daily before breakfast.   loratadine 10 MG tablet Commonly known as:  CLARITIN Take 10 mg by mouth daily.   mirtazapine 15 MG tablet Commonly known as:  REMERON Take 7.5 mg by mouth at bedtime. 1/2 TAB   nitroGLYCERIN 0.4 MG SL tablet Commonly known as:  NITROSTAT Place 0.4 mg under the tongue every 5 (five) minutes as needed for chest pain.   OLANZapine 2.5 MG tablet Commonly known as:  ZYPREXA Take 1 tablet (2.5 mg total) by mouth 2 (two) times daily.   polyethylene glycol packet Commonly known as:  MIRALAX / GLYCOLAX Take 17 g by mouth daily as needed for  mild constipation.   Rivaroxaban 15 MG Tabs tablet Commonly known as:  XARELTO Take 15 mg by mouth daily.   sertraline 50 MG tablet Commonly known as:  ZOLOFT Take 50 mg by mouth at bedtime.   simvastatin 10 MG tablet Commonly known as:  ZOCOR Take 10 mg by mouth at bedtime.   tiotropium 18 MCG inhalation capsule Commonly known as:  SPIRIVA Place 18 mcg into inhaler and inhale daily.   traZODone 150 MG tablet Commonly known as:  DESYREL Take 150 mg by mouth. Take one 150 mg tablet with 25 mg tablet to equal 175 mg at bedtime   traZODone 50 MG tablet Commonly known as:  DESYREL Take 50 mg by mouth. Take 1/2 tablet (25 mg) along with 150 mg tablet to equal 175 mg at bedtime   Vitamin D3 1000 units Caps Take 1 capsule by mouth daily.       Contact information for follow-up providers    [name], MD. Schedule an appointment as soon as possible for a visit in 1 week(s).  Specialty:  Internal Medicine Contact information: [place] [id number] [phone]            Contact information for after-discharge care    Destination    [facility] .   Service:  Skilled Nursing Contact information: [address] [place] [phone]                 No Known Allergies  Consultations: Psychiatry    Procedures/Studies: Ct Head Wo Contrast  Result Date: 11/08/2017 CLINICAL DATA:  Altered level of consciousness. EXAM: CT HEAD WITHOUT CONTRAST TECHNIQUE: Contiguous axial images were obtained from the base of the skull through the vertex without intravenous contrast. COMPARISON:  CT scan of August 25, 2016. FINDINGS: Brain: Old left frontal and right posterior parietal infarctions are noted. No mass effect or midline shift is noted. Ventricular size is within normal limits. There is no evidence of mass lesion, hemorrhage or acute infarction. Vascular: No hyperdense vessel or unexpected  calcification. Skull: No fracture or other bony abnormality is noted. Fluid is noted in right mastoid air cells. Sinuses/Orbits: No acute finding. Other: None. IMPRESSION: Old left frontal and right posterior parietal infarctions. No acute intracranial abnormality seen. Electronically Signed   By: Marijo Conception, M.D.   On: 11/08/2017 16:57   Mr Brain Wo Contrast  Result Date: 11/09/2017 CLINICAL DATA:  76 y/o female with increasing confusion, agitation, hallucination. Chronic right ICA occlusion. EXAM: MRI HEAD WITHOUT CONTRAST TECHNIQUE: Multiplanar, multiecho pulse sequences of the brain and surrounding structures were obtained without intravenous contrast. COMPARISON:  CT head without contrast 11/08/2017, CTA neck 08/26/2016, and earlier. FINDINGS: Brain: Chronic infarcts with encephalomalacia in the left superior frontal gyrus, right parietal lobe. Mild associated hemosiderin. Superimposed Patchy and confluent bilateral cerebral white matter T2 and FLAIR hyperintensity. Mild for age T2 heterogeneity in the bilateral deep gray matter nuclei. T2 hyperintensity in the pons greater on the right might reflect Wallerian degeneration. Cerebellum is within normal limits. No convincing restricted diffusion to suggest acute infarction. No midline shift, mass effect, evidence of mass lesion, ventriculomegaly, extra-axial collection or acute intracranial hemorrhage. Cervicomedullary junction and pituitary are within normal limits. Vascular: Loss of the right ICA flow void in the upper neck and siphon. Evidence of reconstituted flow at the right ICA terminus, probably via a right posterior communicating artery. Other Major intracranial vascular flow voids are preserved with mild generalized intracranial artery tortuosity. Skull and upper cervical spine: Negative visualized cervical spine. Visualized bone marrow signal is within normal limits. Sinuses/Orbits: Postoperative changes to both globes, otherwise negative  orbits soft tissues. Paranasal sinuses are clear. Other: Chronic right mastoid effusion. Trace left mastoid fluid also appears chronic. Negative nasopharynx. Visible internal auditory structures appear normal. Scalp and face soft tissues appear negative. IMPRESSION: 1.  No acute intracranial abnormality. 2. Chronic right carotid occlusion. Chronic moderate-size cortical infarcts in the left frontal and right parietal lobes. 3. Moderately advanced for age bilateral cerebral white matter signal changes, most commonly due to chronic small vessel disease. Possible Wallerian degeneration in the right brainstem. 4. Chronic right mastoid effusion. Electronically Signed   By: Genevie Ann M.D.   On: 11/09/2017 08:16   Dg Chest Port 1 View  Result Date: 11/09/2017 CLINICAL DATA:  Acute onset of confusion.  Encephalopathy. EXAM: PORTABLE CHEST 1 VIEW COMPARISON:  Chest radiograph performed 08/25/2016 FINDINGS: The lungs are well-aerated and clear. There is no evidence of focal opacification, pleural effusion or  pneumothorax. The cardiomediastinal silhouette is within normal limits. No acute osseous abnormalities are seen. There is chronic deformity of the proximal right humerus. IMPRESSION: No acute cardiopulmonary process seen. Electronically Signed   By: Garald Balding M.D.   On: 11/09/2017 06:46    Subjective: Denies chest pains, SOB.  Still having some dysuria and lower abdominal pain, but otherwise no complaints.  Slightly less tangential today compared to previous days.    Discharge Exam: Vitals:   11/11/17 1429 11/11/17 1615  BP: 135/89   Pulse: (!) 43 73  Resp: 18   Temp: 97.7 F (36.5 C)   SpO2: 100%    Vitals:   11/10/17 2159 11/11/17 0545 11/11/17 1429 11/11/17 1615  BP: (!) 144/74 108/89 135/89   Pulse: 80 62 (!) 43 73  Resp: 18 18 18    Temp: 98.3 F (36.8 C) 98 F (36.7 C) 97.7 F (36.5 C)   TempSrc: Oral Oral Oral   SpO2: 99% 98% 100%   Weight:      Height:        General: Pt is  alert, awake, not in acute distress Cardiovascular: IRRR, S1/S2 +, no rubs, no gallops Respiratory: CTA bilaterally, no wheezing, no rhonchi Abdominal: Soft, ND, bowel sounds +, mild TTP over the suprapubic area without rebound or guarding.   Extremities: no edema, no cyanosis    The results of significant diagnostics from this hospitalization (including imaging, microbiology, ancillary and laboratory) are listed below for reference.     Microbiology: Recent Results (from the past 240 hour(s))  Urine culture     Status: Abnormal   Collection Time: 11/08/17  3:01 PM  Result Value Ref Range Status   Specimen Description URINE, CATHETERIZED  Final   Special Requests NONE  Final   Culture (A)  Final    >=100,000 COLONIES/mL ESCHERICHIA COLI Confirmed Extended Spectrum Beta-Lactamase Producer (ESBL).  In bloodstream infections from ESBL organisms, carbapenems are preferred over piperacillin/tazobactam. They are shown to have a lower risk of mortality.    Report Status 11/10/2017 FINAL  Final   Organism ID, Bacteria ESCHERICHIA COLI (A)  Final      Susceptibility   Escherichia coli - MIC*    AMPICILLIN >=32 RESISTANT Resistant     CEFAZOLIN >=64 RESISTANT Resistant     CEFTRIAXONE >=64 RESISTANT Resistant     CIPROFLOXACIN >=4 RESISTANT Resistant     GENTAMICIN <=1 SENSITIVE Sensitive     IMIPENEM <=0.25 SENSITIVE Sensitive     NITROFURANTOIN <=16 SENSITIVE Sensitive     TRIMETH/SULFA <=20 SENSITIVE Sensitive     AMPICILLIN/SULBACTAM >=32 RESISTANT Resistant     PIP/TAZO >=128 RESISTANT Resistant     Extended ESBL POSITIVE Resistant     ERTAPENEM Value in next row Sensitive      SENSITIVE<=0.5Performed at [facility] [address], [place],  [address]    * >=100,000 COLONIES/mL ESCHERICHIA COLI  MRSA PCR Screening     Status: None   Collection Time: 11/08/17 11:40 PM  Result Value Ref Range Status   MRSA by PCR NEGATIVE NEGATIVE Final    Comment:        The  GeneXpert MRSA Assay (FDA approved for NASAL specimens only), is one component of a comprehensive MRSA colonization surveillance program. It is not intended to diagnose MRSA infection nor to guide or monitor treatment for MRSA infections.      Labs: BNP (last 3 results) No results for input(s): BNP in the last 8760 hours. Basic  Metabolic Panel: Recent Labs  Lab 11/09/17 0822  NA 143  K 3.6  CL 112*  CO2 23  GLUCOSE 109*  BUN 16  CREATININE 0.86  CALCIUM 9.5   Liver Function Tests: No results for input(s): AST, ALT, ALKPHOS, BILITOT, PROT, ALBUMIN in the last 168 hours. No results for input(s): LIPASE, AMYLASE in the last 168 hours. No results for input(s): AMMONIA in the last 168 hours. CBC: Recent Labs  Lab 11/09/17 0822  WBC 6.4  HGB 12.8  HCT 40.0  MCV 87.9  PLT 209   Cardiac Enzymes: No results for input(s): CKTOTAL, CKMB, CKMBINDEX, TROPONINI in the last 168 hours. BNP: Invalid input(s): POCBNP CBG: No results for input(s): GLUCAP in the last 168 hours. D-Dimer No results for input(s): DDIMER in the last 72 hours. Hgb A1c No results for input(s): HGBA1C in the last 72 hours. Lipid Profile No results for input(s): CHOL, HDL, LDLCALC, TRIG, CHOLHDL, LDLDIRECT in the last 72 hours. Thyroid function studies No results for input(s): TSH, T4TOTAL, T3FREE, THYROIDAB in the last 72 hours.  Invalid input(s): FREET3 Anemia work up No results for input(s): VITAMINB12, FOLATE, FERRITIN, TIBC, IRON, RETICCTPCT in the last 72 hours. Urinalysis    Component Value Date/Time   COLORURINE AMBER (A) 11/08/2017 1501   APPEARANCEUR HAZY (A) 11/08/2017 1501   LABSPEC 1.020 11/08/2017 1501   PHURINE 5.0 11/08/2017 1501   GLUCOSEU NEGATIVE 11/08/2017 1501   HGBUR SMALL (A) 11/08/2017 1501   BILIRUBINUR NEGATIVE 11/08/2017 1501   KETONESUR NEGATIVE 11/08/2017 1501   PROTEINUR 100 (A) 11/08/2017 1501   UROBILINOGEN 0.2 01/02/2015 1313   NITRITE NEGATIVE 11/08/2017  1501   LEUKOCYTESUR TRACE (A) 11/08/2017 1501   Sepsis Labs Invalid input(s): PROCALCITONIN,  WBC,  LACTICIDVEN   Time coordinating discharge: Over 30 minutes  SIGNED:   Janece Canterbury, MD  [facility] 11/15/2017, 8:34 PM Pager   If 7PM-7AM, please contact night-coverage [email or web address] Password [email or web address]

## 2017-11-11 NOTE — Progress Notes (Signed)
Date: November 11, 2017 Chart review for discharge needs:  None found for case management. Patient has no questions concerning post hospital care.

## 2017-11-11 NOTE — Progress Notes (Signed)
Daughter [name] informed patient will d/c back to facility today.  [facility] called for transport. Nurse given number to call report.   Kathrin Greathouse, Latanya Presser, MSW Clinical Social Worker  [phone] 11/11/2017  4:34 PM

## 2017-11-11 NOTE — Progress Notes (Signed)
Report called to [place] at [facility].

## 2017-11-11 NOTE — Progress Notes (Signed)
EKG attempted, and patient would not cooperate.  Patient kept pulling leads off, and would not let RN continue with test.

## 2017-11-11 NOTE — Clinical Social Work Note (Signed)
Clinical Social Work Assessment  Patient Details  Name: Cindy [name] MRN: [id number] Date of Birth: January 02, 1941  Date of referral:  11/11/17               Reason for consult:  Facility Placement                Permission sought to share information with:  Family Supports, Chartered certified accountant granted to share information::     Name::        Agency::  [facility]  Relationship::  Daughter-Cindy [name]  Contact Information:     Housing/Transportation Living arrangements for the past 2 months:  [place] of Information:  Adult Children Patient Interpreter Needed:  None Criminal Activity/Legal Involvement Pertinent to Current Situation/Hospitalization:  No - Comment as needed Significant Relationships:  Adult Children Lives with:  Facility Resident Do you feel safe going back to the place where you live?  Yes Need for family participation in patient care:  No (Coment)  Care giving concerns:  Patient admitted for altered mental status.   Patient daughter is concerned about patient increase in confusion with hallucinations and agitation at the SNF. She reports the patient was eating poorly and not taking her medications.   Patient daughter was hopeful to have a meeting with psychiatrist to determine if her medications needed to be adjusted. She was hopeful the psychiatrist would recommend ger-psych so the patient medication could be monitored and adjusted.   Patient was evaluated by psychiatrist and recommends:  discontinuing Seroquel and starting Zyprexa 2.5 mg BID. Can titrate as needed for psychosis.  She does not feel patient is danger to self or others and does not recommend inpatient psychiatric placement.    Social Worker assessment / plan: CSW discussed patient discharge back to [facility]. Patient daughter Cindy [name]. CSW listened to her concerns about patient altered mental status and hallucinations. CSW confirmed facility will accept patient  back.  D/C summary sent. [facility] called to transport.    Plan: Return [facility]  Employment status:  Retired Forensic scientist:  Medicare PT Recommendations:  Not assessed at this time Information / Referral to community resources:  [place]  Patient/Family's Response to care:  Prefers the patient go to a [facility] facility. But agreeable for her to return back to SNF to continue treatment.   Patient/Family's Understanding of and Emotional Response to Diagnosis, Current Treatment, and Prognosis:  Patient daughter very knowledgeable of patient diagnosis and treatment.   Emotional Assessment Appearance:  Developmentally appropriate Attitude/Demeanor/Rapport:    Affect (typically observed):    Orientation:  Oriented to Self Alcohol / Substance use:  Not Applicable Psych involvement (Current and /or in the community):  No (Comment)  Discharge Needs  Concerns to be addressed:  Discharge Planning Concerns Readmission within the last 30 days:  Yes Current discharge risk:  Dependent with Mobility, Psychiatric Illness Barriers to Discharge:  No Barriers Identified   Lia Hopping, LCSW 11/11/2017, 4:21 PM

## 2017-11-11 NOTE — [place] FL2 (Signed)
[place] MEDICAID FL2 LEVEL OF CARE SCREENING TOOL     IDENTIFICATION  Patient Name: Cindy [name] Birthdate: 01-27-1941 Sex: female Admission Date (Current Location): 11/08/2017  [facility] and [place] Number:  Herbalist and Address:  [facility],  [place] [address], [place]      Provider Number: [id number]  Attending Physician Name and Address:  Janece Canterbury, MD  Relative Name and Phone Number:       Current Level of Care: Hospital Recommended Level of Care: [place] Prior Approval Number:    Date Approved/Denied:   PASRR Number: [id number]  Discharge Plan:  Skilled Nursing Facility     Current Diagnoses: Patient Active Problem List   Diagnosis Date Noted  . Altered mental status 11/10/2017  . Acute metabolic encephalopathy 89/38/1017  . Mania ([place]) 06/21/2017  . Hypomagnesemia 06/19/2017  . Essential hypertension 06/18/2017  . Acute encephalopathy 06/17/2017  . Acute lower UTI 06/17/2017  . E. coli UTI 06/17/2017  . Depression 04/24/2017  . Hypothyroidism 10/03/2016  . Paranoia ([place]) 10/03/2016  . LOC (loss of consciousness) ([place]) 09/02/2016  . 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 Dementia with behavioral disturbance 09/02/2016  . Asthma with acute exacerbation 08/21/2016  . Atrial fibrillation with RVR ([place]) 08/21/2016  . Elevated INR 08/21/2016  . Acute on chronic systolic CHF (congestive heart failure) ([place]) 08/21/2016  . Hyperlipidemia 08/21/2016  . Electrolyte abnormality 08/21/2016  . HCAP (healthcare-associated pneumonia) 08/10/2016  . Sepsis ([place]) 08/09/2016  . Coronary atherosclerosis of native coronary artery 01/21/2014  . Obesity 01/17/2014  . Abdominal pain, generalized 10/18/2012  . GERD (gastroesophageal reflux disease)   . Mood disorder ([place])   . Anxiety   . 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[place])   . Stroke ([place])   . Chest pain 01/07/2012  .  Asthma 01/07/2012  . A-fib ([place]) 01/07/2012  . 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 Anemia 01/07/2012  . ANEMIA 07/29/2010  . BARRETTS ESOPHAGUS 06/04/2010    Orientation RESPIRATION BLADDER Height & Weight     Self  Normal Continent Weight: 148 lb 2.4 oz (67.2 kg) Height:  5' (152.4 cm)  BEHAVIORAL SYMPTOMS/MOOD NEUROLOGICAL BOWEL NUTRITION STATUS      Continent (Dys. 3 Diet)  AMBULATORY STATUS COMMUNICATION OF NEEDS Skin   Extensive Assist Verbally Normal                       Personal Care Assistance Level of Assistance  Bathing, Feeding, Dressing Bathing Assistance: Maximum assistance Feeding assistance: Independent Dressing Assistance: Maximum assistance     Functional Limitations Info  Sight, Hearing, Speech Sight Info: Adequate Hearing Info: Adequate Speech Info: Adequate    SPECIAL CARE FACTORS FREQUENCY  PT (By licensed PT), OT (By licensed OT)     PT Frequency: 5X/week OT Frequency: 5x/week            Contractures Contractures Info: Not present    Additional Factors Info  Psychotropic Code Status Info: DNR   Psychotropic Info: Zyprexa, Buspar, Trazadone, Zoloft         Current Medications (11/11/2017):  This is the current hospital active medication list Current Facility-Administered Medications  Medication Dose Route Frequency Provider Last Rate Last Dose  . acetaminophen (TYLENOL) tablet 650 mg  650 mg Oral Q6H PRN Rise Patience, MD       Or  . acetaminophen (TYLENOL) suppository 650 mg  650 mg  Rectal Q6H PRN Rise Patience, MD      . albuterol (PROVENTIL) (2.5 MG/3ML) 0.083% nebulizer solution 3 mL  3 mL Inhalation Q6H PRN Rise Patience, MD      . aspirin EC tablet 81 mg  81 mg Oral Daily Rise Patience, MD   81 mg at 11/11/17 1057  . busPIRone (BUSPAR) tablet 10 mg  10 mg Oral TID Rise Patience, MD   10 mg at 11/11/17 1057  . carvedilol (COREG) tablet 3.125 mg  3.125 mg Oral BID WC  Rise Patience, MD   3.125 mg at 11/11/17 0759  . cholecalciferol (VITAMIN D) tablet 1,000 Units  1,000 Units Oral Daily Rise Patience, MD   1,000 Units at 11/11/17 1056  . dicyclomine (BENTYL) capsule 10 mg  10 mg Oral TID AC Rise Patience, MD   10 mg at 11/11/17 0759  . feeding supplement (ENSURE ENLIVE) (ENSURE ENLIVE) liquid 237 mL  237 mL Oral TID BM Rise Patience, MD   237 mL at 11/09/17 2047  . hydrALAZINE (APRESOLINE) injection 10 mg  10 mg Intravenous Q4H PRN Rise Patience, MD      . ipratropium-albuterol (DUONEB) 0.5-2.5 (3) MG/3ML nebulizer solution 3 mL  3 mL Nebulization Q4H PRN Rise Patience, MD      . isosorbide mononitrate (IMDUR) 24 hr tablet 30 mg  30 mg Oral Daily Rise Patience, MD   30 mg at 11/11/17 1057  . levothyroxine (SYNTHROID, LEVOTHROID) tablet 25 mcg  25 mcg Oral QAC breakfast Rise Patience, MD   25 mcg at 11/11/17 0758  . loratadine (CLARITIN) tablet 10 mg  10 mg Oral Daily Rise Patience, MD   10 mg at 11/11/17 1056  . mirtazapine (REMERON) tablet 7.5 mg  7.5 mg Oral QHS Rise Patience, MD   7.5 mg at 11/10/17 2242  . mometasone-formoterol (DULERA) 200-5 MCG/ACT inhaler 2 puff  2 puff Inhalation BID Rise Patience, MD   2 puff at 11/10/17 8756  . nitroGLYCERIN (NITROSTAT) SL tablet 0.4 mg  0.4 mg Sublingual Q5 min PRN Rise Patience, MD      . OLANZapine [facility]) tablet 2.5 mg  2.5 mg Oral BID Janece Canterbury, MD   2.5 mg at 11/11/17 1052  . ondansetron (ZOFRAN) tablet 4 mg  4 mg Oral Q6H PRN Rise Patience, MD       Or  . ondansetron [facility]) injection 4 mg  4 mg Intravenous Q6H PRN Rise Patience, MD      . pantoprazole (PROTONIX) EC tablet 40 mg  40 mg Oral Daily Rise Patience, MD   40 mg at 11/11/17 1057  . polyethylene glycol (MIRALAX / GLYCOLAX) packet 17 g  17 g Oral Daily PRN Rise Patience, MD      . Rivaroxaban [facility]) tablet 15 mg  15 mg Oral Daily  Rise Patience, MD   15 mg at 11/11/17 1056  . sertraline (ZOLOFT) tablet 50 mg  50 mg Oral QHS Rise Patience, MD   50 mg at 11/10/17 2242  . simvastatin (ZOCOR) tablet 10 mg  10 mg Oral QHS Rise Patience, MD   10 mg at 11/10/17 2242  . tiotropium (SPIRIVA) inhalation capsule 18 mcg  18 mcg Inhalation Daily Rise Patience, MD   18 mcg at 11/10/17 4332  . traZODone (DESYREL) tablet 150 mg  150 mg Oral QHS Rise Patience, MD  150 mg at 11/10/17 2242  . traZODone (DESYREL) tablet 25 mg  25 mg Oral QHS Rise Patience, MD   25 mg at 11/10/17 2242     Discharge Medications: Please see discharge summary for a list of discharge medications.  Relevant Imaging Results:  Relevant Lab Results:   Additional Information SS#: [id number]  Lia Hopping, LCSW

## 2017-11-14 ENCOUNTER — Non-Acute Institutional Stay (SKILLED_NURSING_FACILITY): Payer: Medicare Other | Admitting: Internal Medicine

## 2017-11-14 ENCOUNTER — Encounter: Payer: Self-pay | Admitting: Internal Medicine

## 2017-11-14 ENCOUNTER — Telehealth: Payer: Self-pay

## 2017-11-14 DIAGNOSIS — Z1612 Extended spectrum beta lactamase (ESBL) resistance: Secondary | ICD-10-CM

## 2017-11-14 DIAGNOSIS — E034 Atrophy of thyroid (acquired): Secondary | ICD-10-CM

## 2017-11-14 DIAGNOSIS — G301 Alzheimer's disease with late onset: Secondary | ICD-10-CM | POA: Diagnosis not present

## 2017-11-14 DIAGNOSIS — I25119 Atherosclerotic heart disease of native coronary artery with unspecified angina pectoris: Secondary | ICD-10-CM

## 2017-11-14 DIAGNOSIS — F0281 Dementia in other diseases classified elsewhere with behavioral disturbance: Secondary | ICD-10-CM | POA: Diagnosis not present

## 2017-11-14 DIAGNOSIS — N390 Urinary tract infection, site not specified: Secondary | ICD-10-CM

## 2017-11-14 DIAGNOSIS — I209 Angina pectoris, unspecified: Secondary | ICD-10-CM | POA: Diagnosis not present

## 2017-11-14 DIAGNOSIS — G9341 Metabolic encephalopathy: Secondary | ICD-10-CM | POA: Diagnosis not present

## 2017-11-14 DIAGNOSIS — F02818 Dementia in other diseases classified elsewhere, unspecified severity, with other behavioral disturbance: Secondary | ICD-10-CM

## 2017-11-14 DIAGNOSIS — B9629 Other Escherichia coli [E. coli] as the cause of diseases classified elsewhere: Secondary | ICD-10-CM | POA: Diagnosis not present

## 2017-11-14 DIAGNOSIS — I482 Chronic atrial fibrillation, unspecified: Secondary | ICD-10-CM

## 2017-11-14 DIAGNOSIS — E782 Mixed hyperlipidemia: Secondary | ICD-10-CM

## 2017-11-14 NOTE — Progress Notes (Signed)
: Provider:  Noah Delaine. [name], MD Location:   [place] Room Number: [phone] Place of Service:  SNF ([phone])  PCP: [name], MD Patient Care Team: [name], MD as PCP - General (Internal Medicine)  Extended Emergency Contact Information Primary Emergency Contact: Tyner,[name] Address: [address] [place], [place] [address] [name] Work Phone: [phone] Mobile Phone: [phone] Relation: Daughter Secondary Emergency Contact: Amena, [place] Mobile Phone: [phone] Relation: Son     Allergies: Patient has no known allergies.  Chief Complaint  Patient presents with  . Readmit To SNF    following hospitalization 11/08/17 to 11/11/17 alterd mental status    HPI: Patient is 76 y.o. female with dementia, atrial fibrillation, asthma, uterine cancer, and CVA, CAD, Barrett's esophagus, who was brought to the ER to patient was found to be increasingly confused with hallucinations and agitation in the skilled nursing facility for 2 days. Patient been eating poorly and taken her medications. CT of head was unremarkable. Chest x-ray negative. Urinalysis was suggestive of UTI. Family since she was before confused than usual so MRI was ordered an MRI brain showed no acute disease. She seems to have some delusions and remains very confused although she is awake and alert. She was seen by psychiatry who recommended her changing her cerebral Zyprexa but they did not recommend inpatient Geri psych admission at this time she was initially started on Rocephin but her urine grew out ESBL Escherichia coli and so her antibiotics were changed to ertapenem. On the day of discharge she was given a dose of fosfomycin. Patient is readmitted to skilled nursing facility for residential care. While at skilled nursing facility patient will be followed for coronary artery disease treated with aspirin and Coreg sublingual nitroglycerin indoor and statin,  hypothyroidism treated with replacement, and hyperlipidemia treated Zocor.  Past Medical History:  Diagnosis Date  . A-fib ([place]) 01/07/2012  . ANEMIA 07/29/2010   Qualifier: Diagnosis of  By: Bobby Rumpf CMA (AAMA), Patty    . Angina pectoris ([place])   . Anxiety   . Asthma   . Asthma 01/07/2012  . Barrett's esophagus   . Bronchitis   . Cancer ([place])    uterian  . Chest pain   . Coronary artery disease    right carotid occlusion   . Dementia with behavioral disturbance 09/02/2016  . Depression   . Dysrhythmia    atrial fibrillation  . GERD (gastroesophageal reflux disease)   . 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 Hyperlipidemia   . Hypertension   . Mood disorder ([place])   . Paranoia ([place]) 10/03/2016  . Recurrent upper respiratory infection (URI)   . Stroke [facility])    2007, after left endarterectomy    Past Surgical History:  Procedure Laterality Date  . ABDOMINAL HYSTERECTOMY    . BALLOON DILATION  10/18/2012   Procedure: BALLOON DILATION;  Surgeon: Lear Ng, MD;  Location: [facility] END[facility]COPY;  Service: Endoscopy;  Laterality: N/A;  . CARDIAC CATHETERIZATION    . CAROTID ENDARTERECTOMY     left, complicated by stroke  . CHOLECYSTECTOMY     incidental at time of colectomy  . COLONOSCOPY  12/01/2011   Procedure: COLONOSCOPY;  Surgeon: Lear Ng, MD;  Location: [facility];  Service: Endoscopy;  Laterality: N/A;  . COLONOSCOPY  10/18/2012   Procedure: COLONOSCOPY;  Surgeon: Lear Ng, MD;  Location: [facility];  Service: Endoscopy;  Laterality: N/A;  colonic dilation  . ESOPHAGOGASTRODUODENOSCOPY  12/01/2011   Procedure: ESOPHAGOGASTRODUODENOSCOPY (EGD);  Surgeon: Lear Ng, MD;  Location: [facility];  Service: Endoscopy;  Laterality: N/A;  . HOT HEMOSTASIS  12/01/2011   Procedure: HOT HEMOSTASIS (ARGON PLASMA COAGULATION/BICAP);  Surgeon: Lear Ng, MD;  Location: [facility];  Service: Endoscopy;  Laterality: N/A;  . 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 Always use your most recent med list.          acetaminophen 325 MG tablet Commonly known as:  TYLENOL Take 2 tablets (650 mg total) by mouth every 6 (six) hours as needed for mild pain (or Fever >/= 101).   albuterol 108 (90 Base) MCG/ACT inhaler Commonly known as:  PROVENTIL HFA;VENTOLIN HFA Inhale 2 puffs into the lungs every 6 (six) hours as needed for shortness of breath.   alendronate 70 MG tablet Commonly known as:  FOSAMAX Take 70 mg by mouth every Monday. Take with a full glass of water on an empty stomach.   aspirin EC 81 MG tablet Take 81 mg by mouth daily.   budesonide-formoterol 160-4.5 MCG/ACT inhaler Commonly known as:  SYMBICORT Inhale 2 puffs into the lungs 2 (two) times daily.   busPIRone 10 MG tablet Commonly known as:  BUSPAR Take 10 mg by mouth 3 (three) times daily.   carvedilol 3.125 MG tablet Commonly known as:  COREG Take 1 tablet (3.125 mg total) by mouth 2 (two) times daily with a meal.   dicyclomine 10 MG capsule Commonly known as:  BENTYL Take 10 mg by mouth. Take one capsule three times daily for IBS   feeding supplement (ENSURE ENLIVE) Liqd Take 237 mLs by mouth. Drink once daily due to weight loss   ferrous sulfate 325 (65 FE) MG tablet Take 325 mg by mouth daily with breakfast.   ipratropium-albuterol 0.5-2.5 (3) MG/3ML Soln Commonly known as:  DUONEB Take 3 mLs by nebulization every 4 (four) hours as needed.   isosorbide mononitrate 30 MG 24 hr tablet Commonly known as:  IMDUR Take 30 mg by mouth daily.   levothyroxine 25 MCG tablet Commonly known as:  SYNTHROID, LEVOTHROID Take 25 mcg by mouth daily before breakfast.   loratadine 10 MG tablet Commonly known as:  CLARITIN Take 10 mg by mouth daily.   mirtazapine 15 MG  tablet Commonly known as:  REMERON Take 7.5 mg by mouth at bedtime. 1/2 TAB   nitroGLYCERIN 0.4 MG SL tablet Commonly known as:  NITROSTAT Place 0.4 mg under the tongue every 5 (five) minutes as needed for chest pain.   OLANZapine 2.5 MG tablet Commonly known as:  ZYPREXA Take 1 tablet (2.5 mg total) by mouth 2 (two) times daily.   polyethylene glycol packet Commonly known as:  MIRALAX / GLYCOLAX Take 17 g by mouth daily as needed for mild constipation.   Rivaroxaban 15 MG Tabs tablet Commonly known as:  XARELTO Take 15 mg by mouth daily.   sertraline 50 MG tablet Commonly known as:  ZOLOFT Take 50 mg by mouth at bedtime.   simvastatin 10 MG tablet Commonly known as:  ZOCOR Take 10 mg by mouth at bedtime.   tiotropium 18 MCG inhalation capsule Commonly known as:  [place]  18 mcg into inhaler and inhale daily.   traZODone 150 MG tablet Commonly known as:  DESYREL Take 150 mg by mouth. Take one 150 mg tablet with 25 mg tablet to equal 175 mg at bedtime   traZODone 50 MG tablet Commonly known as:  DESYREL Take 50 mg by mouth. Take 1/2 tablet (25 mg) along with 150 mg tablet to equal 175 mg at bedtime   Vitamin D3 1000 units Caps Take 1 capsule by mouth daily.       No orders of the defined types were placed in this encounter.   Immunization History  Administered Date(s) Administered  . Influenza-Unspecified 08/26/2016, 10/21/2017  . PPD Test 08/16/2016  . Pneumococcal Polysaccharide-23 01/08/2012    Social History   Tobacco Use  . Smoking status: Former Smoker    Last attempt to quit: 04/09/2003    Years since quitting: 14.6  . Smokeless tobacco: Never Used  Substance Use Topics  . Alcohol use: No    Family history is   Family History  Problem Relation Age of Onset  . Heart disease Mother   . Cancer Mother   . Heart attack Father   . Cancer Sister        ovarian      Review of Systems  DATA OBTAINED: from patient, nurse GENERAL:  no  fevers, fatigue, appetite changes SKIN: No itching, or rash EYES: No eye pain, redness, discharge EARS: No earache, tinnitus, change in hearing NOSE: No congestion, drainage or bleeding  MOUTH/THROAT: No mouth or tooth pain, No sore throat RESPIRATORY: No cough, wheezing, SOB CARDIAC: No chest pain, palpitations, lower extremity edema  GI: No abdominal pain, No N/V/D or constipation, No heartburn or reflux  GU: No dysuria, frequency or urgency, or incontinence  MUSCULOSKELETAL: No unrelieved bone/joint pain NEUROLOGIC: No headache, dizziness or focal weakness PSYCHIATRIC: Patient is a frail  Vitals:   11/14/17 1059  BP: 140/86  Pulse: 75  Resp: 16  SpO2: 100%    SpO2 Readings from Last 1 Encounters:  11/14/17 100%   Body mass index is 27.71 kg/m.     Physical Exam  GENERAL APPEARANCE: Alert, conversant,  No acute distress.  SKIN: No diaphoresis rash HEAD: Normocephalic, atraumatic  EYES: Conjunctiva/lids clear. Pupils round, reactive. EOMs intact.  EARS: External exam WNL, canals clear. Hearing grossly normal.  NOSE: No deformity or discharge.  MOUTH/THROAT: Lips w/o lesions  RESPIRATORY: Breathing is even, unlabored. Lung sounds are clear   CARDIOVASCULAR: Heart RRR no murmurs, rubs or gallops. No peripheral edema.   GASTROINTESTINAL: Abdomen is soft, non-tender, not distended w/ normal bowel sounds. GENITOURINARY: Bladder non tender, not distended  MUSCULOSKELETAL: No abnormal joints or musculature NEUROLOGIC:  Cranial nerves 2-12 grossly intact. Moves all extremities  PSYCHIATRIC: Frankly paranoid with continuous hallucinations about dead babies and people coming after her; this lesion was reported that she'll call for closer closet and urinated and defecated on them  Patient Active Problem List   Diagnosis Date Noted  . Altered mental status 11/10/2017  . Acute metabolic encephalopathy 77/41/2878  . Mania ([place]) 06/21/2017  . Hypomagnesemia 06/19/2017  .  Essential hypertension 06/18/2017  . Acute encephalopathy 06/17/2017  . Acute lower UTI 06/17/2017  . E. coli UTI 06/17/2017  . Depression 04/24/2017  . Hypothyroidism 10/03/2016  . Paranoia ([place]) 10/03/2016  . LOC (loss of consciousness) ([place]) 09/02/2016  . 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 Dementia with behavioral disturbance 09/02/2016  . Asthma with acute exacerbation 08/21/2016  .  Atrial fibrillation with RVR ([place]) 08/21/2016  . Elevated INR 08/21/2016  . Acute on chronic systolic CHF (congestive heart failure) ([place]) 08/21/2016  . Hyperlipidemia 08/21/2016  . Electrolyte abnormality 08/21/2016  . HCAP (healthcare-associated pneumonia) 08/10/2016  . Sepsis ([place]) 08/09/2016  . Coronary atherosclerosis of native coronary artery 01/21/2014  . Obesity 01/17/2014  . Abdominal pain, generalized 10/18/2012  . GERD (gastroesophageal reflux disease)   . Mood disorder ([place])   . Anxiety   . 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[place])   . Stroke ([place])   . Chest pain 01/07/2012  . Asthma 01/07/2012  . A-fib ([place]) 01/07/2012  . 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 Anemia 01/07/2012  . ANEMIA 07/29/2010  . BARRETTS ESOPHAGUS 06/04/2010      Labs reviewed: Basic Metabolic Panel:    Component Value Date/Time   NA 143 11/09/2017 0822   NA 138 04/08/2017   K 3.6 11/09/2017 0822   CL 112 (H) 11/09/2017 0822   CO2 23 11/09/2017 0822   GLUCOSE 109 (H) 11/09/2017 0822   BUN 16 11/09/2017 0822   BUN 41 (A) 04/08/2017   CREATININE 0.86 11/09/2017 0822   CALCIUM 9.5 11/09/2017 0822   PROT 6.9 11/08/2017 1512   ALBUMIN 3.8 11/08/2017 1512   AST 18 11/08/2017 1512   ALT 20 11/08/2017 1512   ALKPHOS 64 11/08/2017 1512   BILITOT 0.9 11/08/2017 1512   GFRNONAA >60 11/09/2017 0822   GFRAA >60 11/09/2017 0822    Recent Labs    06/18/17 0346 06/19/17 0431 11/08/17 1512 11/09/17 0822  NA 140 138 141 143  K 3.7 3.5 3.7 3.6  CL  107 107 105 112*  CO2 24 24 26 23   GLUCOSE 100* 102* 150* 109*  BUN 9 10 20 16   CREATININE 0.94 0.94 0.94 0.86  CALCIUM 8.9 8.9 9.9 9.5  MG 1.7 1.6*  --   --   PHOS 3.4 3.0  --   --    Liver Function Tests: Recent Labs    06/18/17 0346 06/19/17 0431 11/08/17 1512  AST 14* 16 18  ALT 10* 14 20  ALKPHOS 52 53 64  BILITOT 0.5 0.7 0.9  PROT 5.6* 5.7* 6.9  ALBUMIN 3.1* 3.1* 3.8   No results for input(s): LIPASE, AMYLASE in the last 8760 hours. No results for input(s): AMMONIA in the last 8760 hours. CBC: Recent Labs    06/18/17 0346 06/19/17 0431 11/08/17 1512 11/09/17 0822  WBC 5.6 6.5 7.0 6.4  NEUTROABS 3.6 4.2 5.4  --   HGB 11.8* 12.2 13.8 12.8  HCT 36.0 37.3 42.8 40.0  MCV 84.7 84.8 87.2 87.9  PLT 153 147* 233 209   Lipid Recent Labs    06/19/17 0433  CHOL 109  HDL 58  LDLCALC 37  TRIG 68    Cardiac Enzymes: No results for input(s): CKTOTAL, CKMB, CKMBINDEX, TROPONINI in the last 8760 hours. BNP: No results for input(s): BNP in the last 8760 hours. No results found for: [facility] Lab Results  Component Value Date   HGBA1C 5.4 04/08/2017   Lab Results  Component Value Date   TSH 1.680 11/08/2017   Lab Results  Component Value Date   VITAMINB12 1,820 09/29/2016   No results found for: FOLATE No results found for: IRON, TIBC, FERRITIN  Imaging and Procedures obtained prior to SNF admission: Ct Head Wo Contrast  Result Date: 11/08/2017 CLINICAL DATA:  Altered level of consciousness. EXAM: CT HEAD WITHOUT CONTRAST TECHNIQUE:  Contiguous axial images were obtained from the base of the skull through the vertex without intravenous contrast. COMPARISON:  CT scan of August 25, 2016. FINDINGS: Brain: Old left frontal and right posterior parietal infarctions are noted. No mass effect or midline shift is noted. Ventricular size is within normal limits. There is no evidence of mass lesion, hemorrhage or acute infarction. Vascular: No hyperdense vessel or  unexpected calcification. Skull: No fracture or other bony abnormality is noted. Fluid is noted in right mastoid air cells. Sinuses/Orbits: No acute finding. Other: None. IMPRESSION: Old left frontal and right posterior parietal infarctions. No acute intracranial abnormality seen. Electronically Signed   By: Marijo Conception, M.D.   On: 11/08/2017 16:57   Mr Brain Wo Contrast  Result Date: 11/09/2017 CLINICAL DATA:  76 y/o female with increasing confusion, agitation, hallucination. Chronic right ICA occlusion. EXAM: MRI HEAD WITHOUT CONTRAST TECHNIQUE: Multiplanar, multiecho pulse sequences of the brain and surrounding structures were obtained without intravenous contrast. COMPARISON:  CT head without contrast 11/08/2017, CTA neck 08/26/2016, and earlier. FINDINGS: Brain: Chronic infarcts with encephalomalacia in the left superior frontal gyrus, right parietal lobe. Mild associated hemosiderin. Superimposed Patchy and confluent bilateral cerebral white matter T2 and FLAIR hyperintensity. Mild for age T2 heterogeneity in the bilateral deep gray matter nuclei. T2 hyperintensity in the pons greater on the right might reflect Wallerian degeneration. Cerebellum is within normal limits. No convincing restricted diffusion to suggest acute infarction. No midline shift, mass effect, evidence of mass lesion, ventriculomegaly, extra-axial collection or acute intracranial hemorrhage. Cervicomedullary junction and pituitary are within normal limits. Vascular: Loss of the right ICA flow void in the upper neck and siphon. Evidence of reconstituted flow at the right ICA terminus, probably via a right posterior communicating artery. Other Major intracranial vascular flow voids are preserved with mild generalized intracranial artery tortuosity. Skull and upper cervical spine: Negative visualized cervical spine. Visualized bone marrow signal is within normal limits. Sinuses/Orbits: Postoperative changes to both globes, otherwise  negative orbits soft tissues. Paranasal sinuses are clear. Other: Chronic right mastoid effusion. Trace left mastoid fluid also appears chronic. Negative nasopharynx. Visible internal auditory structures appear normal. Scalp and face soft tissues appear negative. IMPRESSION: 1.  No acute intracranial abnormality. 2. Chronic right carotid occlusion. Chronic moderate-size cortical infarcts in the left frontal and right parietal lobes. 3. Moderately advanced for age bilateral cerebral white matter signal changes, most commonly due to chronic small vessel disease. Possible Wallerian degeneration in the right brainstem. 4. Chronic right mastoid effusion. Electronically Signed   By: Genevie Ann M.D.   On: 11/09/2017 08:16   Dg Chest Port 1 View  Result Date: 11/09/2017 CLINICAL DATA:  Acute onset of confusion.  Encephalopathy. EXAM: PORTABLE CHEST 1 VIEW COMPARISON:  Chest radiograph performed 08/25/2016 FINDINGS: The lungs are well-aerated and clear. There is no evidence of focal opacification, pleural effusion or pneumothorax. The cardiomediastinal silhouette is within normal limits. No acute osseous abnormalities are seen. There is chronic deformity of the proximal right humerus. IMPRESSION: No acute cardiopulmonary process seen. Electronically Signed   By: Garald Balding M.D.   On: 11/09/2017 06:46     Not all labs, radiology exams or other studies done during hospitalization come through on my EPIC note; however they are reviewed by me.    Assessment and Plan  ESBL ESCHERICHIA COLI URINARY tract infection-initially treated with Rocephin but when the culture grew out ESBL was changed to ertapenem and then on the day of discharge patient was given  a dose of fosfomycin 3 g SNF - admitted back to skilled nursing for residential care  Acute metabolic encephalopathy/dementia with behavioral disturbance-CT and MRI of the head were both negative; is noted the patient is less confused than when she arrived; she  was seen by psychiatry was changed her Seroquel to Zyprexa SNF - patient presents to just a psychotic today and she was the day she was sent to the hospital; she still needs inpatient treatment; and to continue Zyprexa 2.5 mg twice a day, BuSpar 10 mg 3 times a day, Remeron 7.5 mg daily at bedtime trazodone 175 mg daily at bedtime and Zoloft 50 mg daily at bedtime  CHRONIC ATRIAL FIB SNF - break controlled; continue Coreg 3.125 mg twice a day and prophylaxis with interrupted 50 mg by mouth daily  CAD SNF - stable; continue Imdur 30 mg daily, Coreg 3.125 mg twice a day ASA 81 mg by mouth daily, nitroglycerin sublingual when necessary, and statin  HYPOTHYROIDISM SNF - controlled; continue Synthroid 25 g by mouth daily  HYPERLIPIDEMIA SNF - control; continue Zocor 20 mg by mouth daily    Anne D. [name], MD

## 2017-11-14 NOTE — Telephone Encounter (Signed)
Possible re-admission to facility. This is a patient you were seeing at [facility] . [facility] F/U is needed if patient was re-admitted to facility upon discharge. Hospital discharge from [facility] on 11/11/2017

## 2017-11-15 DIAGNOSIS — R1312 Dysphagia, oropharyngeal phase: Secondary | ICD-10-CM

## 2017-11-15 LAB — CBC AND DIFFERENTIAL
HCT: 41 (ref 36–46)
HEMOGLOBIN: 13.6 (ref 12.0–16.0)
Platelets: 193 (ref 150–399)
WBC: 9.3

## 2017-11-15 LAB — BASIC METABOLIC PANEL
BUN: 11 (ref 4–21)
Creatinine: 0.8 (ref 0.5–1.1)
Glucose: 111
POTASSIUM: 3.9 (ref 3.4–5.3)
SODIUM: 137 (ref 137–147)

## 2017-11-17 ENCOUNTER — Encounter: Payer: Self-pay | Admitting: Internal Medicine

## 2017-11-17 DIAGNOSIS — B9629 Other Escherichia coli [E. coli] as the cause of diseases classified elsewhere: Secondary | ICD-10-CM | POA: Insufficient documentation

## 2017-11-17 DIAGNOSIS — N390 Urinary tract infection, site not specified: Principal | ICD-10-CM

## 2017-11-17 DIAGNOSIS — Z1612 Extended spectrum beta lactamase (ESBL) resistance: Principal | ICD-10-CM

## 2017-11-27 ENCOUNTER — Encounter: Payer: Self-pay | Admitting: Internal Medicine

## 2017-11-27 NOTE — Assessment & Plan Note (Signed)
TSH stable; continue Synthroid 25 g by mouth daily

## 2017-11-27 NOTE — Assessment & Plan Note (Signed)
Chronic and stable; continue supportive care 

## 2017-11-27 NOTE — Assessment & Plan Note (Signed)
No reported reflux; continue omeprazole 40 mg by mouth daily

## 2017-12-07 ENCOUNTER — Ambulatory Visit: Payer: Medicare Other | Admitting: Physical Therapy

## 2017-12-15 ENCOUNTER — Non-Acute Institutional Stay (SKILLED_NURSING_FACILITY): Payer: Medicare Other | Admitting: Internal Medicine

## 2017-12-15 DIAGNOSIS — I209 Angina pectoris, unspecified: Secondary | ICD-10-CM | POA: Diagnosis not present

## 2017-12-15 DIAGNOSIS — E782 Mixed hyperlipidemia: Secondary | ICD-10-CM

## 2017-12-15 DIAGNOSIS — F23 Brief psychotic disorder: Secondary | ICD-10-CM

## 2017-12-15 DIAGNOSIS — F5104 Psychophysiologic insomnia: Secondary | ICD-10-CM | POA: Diagnosis not present

## 2017-12-15 DIAGNOSIS — E034 Atrophy of thyroid (acquired): Secondary | ICD-10-CM | POA: Diagnosis not present

## 2017-12-15 DIAGNOSIS — F329 Major depressive disorder, single episode, unspecified: Secondary | ICD-10-CM

## 2017-12-15 DIAGNOSIS — I482 Chronic atrial fibrillation, unspecified: Secondary | ICD-10-CM

## 2017-12-15 DIAGNOSIS — I25119 Atherosclerotic heart disease of native coronary artery with unspecified angina pectoris: Secondary | ICD-10-CM | POA: Diagnosis not present

## 2017-12-15 DIAGNOSIS — F32A Depression, unspecified: Secondary | ICD-10-CM

## 2017-12-16 ENCOUNTER — Encounter: Payer: Self-pay | Admitting: Internal Medicine

## 2017-12-16 NOTE — Progress Notes (Signed)
Provider:  [name] MD Location:  [facility]   Place of Service:  SNF ([phone])  PCP: [name], MD Patient Care Team: [name], MD as PCP - General (Internal Medicine)  Extended Emergency Contact Information Primary Emergency Contact: Tyner,[name] Address: [address] [place],  [address] [name] Work Phone: [phone] Mobile Phone: [phone] Relation: Daughter Secondary Emergency Contact: Aleana, Fifita Mobile Phone: [phone] Relation: Son     Allergies: Patient has no known allergies.  Chief Complaint  Patient presents with  . New Admit To SNF  . Readmit To SNF    HPI: Patient is 77 y.o. female who [facility] [place], [place] under involuntary commitment for frankly psychotic behavior at the nursing home. [facility] where she was placed on  new medications. She was  able to be given her medications crushed in Mylanta which improved her behavior. Prior to leaving the skilled nursing facility patient was refusing meds. Consideration was given to putting patient on invega the patient's behavior was not felt to warrant that medication during her stay. Patient is admitted back to skilled nursing facility for residential care. While at skilled nursing facility patient will be followed for coronary artery disease treated with Coreg and Imdur, hypothyroidism treated with Synthroid and hyperlipidemia treated with Zocor.  Past Medical History:  Diagnosis Date  . A-fib ([place]) 01/07/2012  . ANEMIA 07/29/2010   Qualifier: Diagnosis of  By: Bobby Rumpf CMA (AAMA), Patty    . Angina pectoris ([place])   . Anxiety   . Asthma   . Asthma 01/07/2012  . Barrett's esophagus   . Bronchitis   . Cancer ([place])    uterian  . Chest pain   . Coronary artery disease    right carotid occlusion   . Dementia with behavioral disturbance 09/02/2016  . Depression    . Dysrhythmia    atrial fibrillation  . GERD (gastroesophageal reflux disease)   . 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 Hyperlipidemia   . Hypertension   . Mood disorder ([place])   . Paranoia ([place]) 10/03/2016  . Recurrent upper respiratory infection (URI)   . Stroke [facility])    2007, after left endarterectomy    Past Surgical History:  Procedure Laterality Date  . ABDOMINAL HYSTERECTOMY    . BALLOON DILATION  10/18/2012   Procedure: BALLOON DILATION;  Surgeon: Lear Ng, MD;  Location: [facility];  Service: Endoscopy;  Laterality: N/A;  . CARDIAC CATHETERIZATION    . CAROTID ENDARTERECTOMY     left, complicated by stroke  . CHOLECYSTECTOMY     incidental at time of colectomy  . COLONOSCOPY  12/01/2011   Procedure: COLONOSCOPY;  Surgeon: Lear Ng, MD;  Location: [facility];  Service: Endoscopy;  Laterality: N/A;  . COLONOSCOPY  10/18/2012   Procedure: COLONOSCOPY;  Surgeon: Lear Ng, MD;  Location: [facility];  Service: Endoscopy;  Laterality: N/A;  colonic dilation  . ESOPHAGOGASTRODUODENOSCOPY  12/01/2011   Procedure: ESOPHAGOGASTRODUODENOSCOPY (EGD);  Surgeon: Lear Ng, MD;  Location: [facility];  Service: Endoscopy;  Laterality: N/A;  . HOT HEMOSTASIS  12/01/2011   Procedure: HOT HEMOSTASIS (ARGON PLASMA COAGULATION/BICAP);  Surgeon: Lear Ng, MD;  Location: [facility];  Service: Endoscopy;  Laterality: N/A;  . 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 Always use your most recent med list.          acetaminophen 325 MG tablet Commonly known as:  TYLENOL Take 2 tablets (650 mg total) by mouth every 6 (six) hours as needed for mild pain (or Fever >/= 101).   alendronate 70 MG tablet Commonly known as:  FOSAMAX Take 70 mg by mouth every Monday. Take  with a full glass of water on an empty stomach.   carvedilol 3.125 MG tablet Commonly known as:  COREG Take 1 tablet (3.125 mg total) by mouth 2 (two) times daily with a meal.   fluticasone 50 MCG/ACT nasal spray Commonly known as:  FLONASE Place 2 sprays into both nostrils daily.   isosorbide mononitrate 30 MG 24 hr tablet Commonly known as:  IMDUR Take 30 mg by mouth daily.   levothyroxine 25 MCG tablet Commonly known as:  SYNTHROID, LEVOTHROID Take 25 mcg by mouth daily before breakfast.   mirtazapine 15 MG tablet Commonly known as:  REMERON Take 7.5 mg by mouth at bedtime. 1/2 TAB   QUEtiapine 100 MG tablet Commonly known as:  SEROQUEL Take 100 mg by mouth every morning.   QUEtiapine 50 MG tablet Commonly known as:  SEROQUEL Take 50 mg by mouth at bedtime.   Rivaroxaban 15 MG Tabs tablet Commonly known as:  XARELTO Take 15 mg by mouth daily.   sertraline 50 MG tablet Commonly known as:  ZOLOFT Take 50 mg by mouth at bedtime.   simvastatin 10 MG tablet Commonly known as:  ZOCOR Take 10 mg by mouth at bedtime.   tiotropium 18 MCG inhalation capsule Commonly known as:  SPIRIVA Place 18 mcg into inhaler and inhale daily.   traZODone 150 MG tablet Commonly known as:  DESYREL Take 150 mg by mouth.   Vitamin D3 1000 units Caps Take 1 capsule by mouth daily.       No orders of the defined types were placed in this encounter.   Immunization History  Administered Date(s) Administered  . Influenza-Unspecified 08/26/2016, 10/21/2017  . PPD Test 08/16/2016  . Pneumococcal Polysaccharide-23 01/08/2012    Social History   Tobacco Use  . Smoking status: Former Smoker    Last attempt to quit: 04/09/2003    Years since quitting: 14.7  . Smokeless tobacco: Never Used  Substance Use Topics  . Alcohol use: No    Family history is   Family History  Problem Relation Age of Onset  . Heart disease Mother   . Cancer Mother   . Heart attack Father   .  Cancer Sister        ovarian      Review of Systems  DATA OBTAINED: from patient-limited; nursing-so far patient is taking her medications GENERAL:  no fevers, fatigue, appetite changes SKIN: No itching, or rash EYES: No eye pain, redness, discharge EARS: No earache, tinnitus, change in hearing NOSE: No congestion, drainage or bleeding  MOUTH/THROAT: No mouth or tooth pain, No sore throat RESPIRATORY: No cough, wheezing, SOB CARDIAC: No chest pain, palpitations, lower extremity edema  GI: No abdominal pain, No N/V/D or constipation, No heartburn or reflux  GU: No dysuria, frequency or urgency, or incontinence  MUSCULOSKELETAL: No unrelieved bone/joint pain NEUROLOGIC: No headache, dizziness or focal weakness PSYCHIATRIC: No c/o anxiety or sadness  Vitals:   12/16/17 1130  BP: (!) 92/53  Pulse: 78  Resp: 18  Temp: 98.6 F (37 C)    SpO2 Readings from Last 1 Encounters:  11/14/17 100%   Body mass index is 27.71 kg/m.     Physical Exam  GENERAL APPEARANCE: Alert, conversant,  No acute distress.  SKIN: No diaphoresis rash HEAD: Normocephalic, atraumatic  EYES: Conjunctiva/lids clear. Pupils round, reactive. EOMs intact.  EARS: External exam WNL, canals clear. Hearing grossly normal.  NOSE: No deformity or discharge.  MOUTH/THROAT: Lips w/o lesions  RESPIRATORY: Breathing is even, unlabored. Lung sounds are clear   CARDIOVASCULAR: Heart RRR no murmurs, rubs or gallops. No peripheral edema.   GASTROINTESTINAL: Abdomen is soft, non-tender, not distended w/ normal bowel sounds. GENITOURINARY: Bladder non tender, not distended  MUSCULOSKELETAL: No abnormal joints or musculature NEUROLOGIC:  Cranial nerves 2-12 grossly intact. Moves all extremities  PSYCHIATRIC: Patient is here Stephens November, no behavioral issues  Patient Active Problem List   Diagnosis Date Noted  . 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 Urinary tract infection due to extended-spectrum beta lactamase (ESBL)  producing Escherichia coli 11/17/2017  . Dysphagia, oropharyngeal 11/15/2017  . Altered mental status 11/10/2017  . Acute metabolic encephalopathy 28/36/6294  . Mania ([place]) 06/21/2017  . Hypomagnesemia 06/19/2017  . Essential hypertension 06/18/2017  . Acute encephalopathy 06/17/2017  . Acute lower UTI 06/17/2017  . E. coli UTI 06/17/2017  . Depression 04/24/2017  . Hypothyroidism 10/03/2016  . Paranoia ([place]) 10/03/2016  . LOC (loss of consciousness) ([place]) 09/02/2016  . 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 Dementia with behavioral disturbance 09/02/2016  . Asthma with acute exacerbation 08/21/2016  . Atrial fibrillation with RVR ([place]) 08/21/2016  . Elevated INR 08/21/2016  . Acute on chronic systolic CHF (congestive heart failure) ([place]) 08/21/2016  . Hyperlipidemia 08/21/2016  . Electrolyte abnormality 08/21/2016  . HCAP (healthcare-associated pneumonia) 08/10/2016  . Sepsis ([place]) 08/09/2016  . Coronary atherosclerosis of native coronary artery 01/21/2014  . Obesity 01/17/2014  . Abdominal pain, generalized 10/18/2012  . GERD (gastroesophageal reflux disease)   . Mood disorder ([place])   . Anxiety   . 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[place])   . Stroke ([place])   . Chest pain 01/07/2012  . Asthma 01/07/2012  . A-fib ([place]) 01/07/2012  . 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 Anemia 01/07/2012  . ANEMIA 07/29/2010  . BARRETTS ESOPHAGUS 06/04/2010      Labs reviewed: Basic Metabolic Panel:    Component Value Date/Time   NA 143 11/09/2017 0822   NA 138 04/08/2017   K 3.6 11/09/2017 0822   CL 112 (H) 11/09/2017 0822   CO2 23 11/09/2017 0822   GLUCOSE 109 (H) 11/09/2017 0822   BUN 16 11/09/2017 0822   BUN 41 (A) 04/08/2017   CREATININE 0.86 11/09/2017 0822   CALCIUM 9.5 11/09/2017 0822   PROT 6.9 11/08/2017 1512   ALBUMIN 3.8 11/08/2017 1512   AST 18 11/08/2017 1512   ALT 20 11/08/2017 1512   ALKPHOS 64 11/08/2017  1512   BILITOT 0.9 11/08/2017 1512   GFRNONAA >60 11/09/2017 0822   GFRAA >60 11/09/2017 0822    Recent Labs    06/18/17 0346 06/19/17 0431 11/08/17 1512 11/09/17 0822  NA 140 138 141 143  K 3.7 3.5 3.7 3.6  CL 107 107 105 112*  CO2 24 24 26 23   GLUCOSE 100* 102* 150* 109*  BUN 9 10 20 16   CREATININE 0.94 0.94  0.94 0.86  CALCIUM 8.9 8.9 9.9 9.5  MG 1.7 1.6*  --   --   PHOS 3.4 3.0  --   --    Liver Function Tests: Recent Labs    06/18/17 0346 06/19/17 0431 11/08/17 1512  AST 14* 16 18  ALT 10* 14 20  ALKPHOS 52 53 64  BILITOT 0.5 0.7 0.9  PROT 5.6* 5.7* 6.9  ALBUMIN 3.1* 3.1* 3.8   No results for input(s): LIPASE, AMYLASE in the last 8760 hours. No results for input(s): AMMONIA in the last 8760 hours. CBC: Recent Labs    06/18/17 0346 06/19/17 0431 11/08/17 1512 11/09/17 0822  WBC 5.6 6.5 7.0 6.4  NEUTROABS 3.6 4.2 5.4  --   HGB 11.8* 12.2 13.8 12.8  HCT 36.0 37.3 42.8 40.0  MCV 84.7 84.8 87.2 87.9  PLT 153 147* 233 209   Lipid Recent Labs    06/19/17 0433  CHOL 109  HDL 58  LDLCALC 37  TRIG 68    Cardiac Enzymes: No results for input(s): CKTOTAL, CKMB, CKMBINDEX, TROPONINI in the last 8760 hours. BNP: No results for input(s): BNP in the last 8760 hours. No results found for: [facility] Lab Results  Component Value Date   HGBA1C 5.4 04/08/2017   Lab Results  Component Value Date   TSH 1.680 11/08/2017   Lab Results  Component Value Date   VITAMINB12 1,820 09/29/2016   No results found for: FOLATE No results found for: IRON, TIBC, FERRITIN  Imaging and Procedures obtained prior to SNF admission: Ct Head Wo Contrast  Result Date: 11/08/2017 CLINICAL DATA:  Altered level of consciousness. EXAM: CT HEAD WITHOUT CONTRAST TECHNIQUE: Contiguous axial images were obtained from the base of the skull through the vertex without intravenous contrast. COMPARISON:  CT scan of August 25, 2016. FINDINGS: Brain: Old left frontal and right  posterior parietal infarctions are noted. No mass effect or midline shift is noted. Ventricular size is within normal limits. There is no evidence of mass lesion, hemorrhage or acute infarction. Vascular: No hyperdense vessel or unexpected calcification. Skull: No fracture or other bony abnormality is noted. Fluid is noted in right mastoid air cells. Sinuses/Orbits: No acute finding. Other: None. IMPRESSION: Old left frontal and right posterior parietal infarctions. No acute intracranial abnormality seen. Electronically Signed   By: Marijo Conception, M.D.   On: 11/08/2017 16:57   Mr Brain Wo Contrast  Result Date: 11/09/2017 CLINICAL DATA:  77 y/o female with increasing confusion, agitation, hallucination. Chronic right ICA occlusion. EXAM: MRI HEAD WITHOUT CONTRAST TECHNIQUE: Multiplanar, multiecho pulse sequences of the brain and surrounding structures were obtained without intravenous contrast. COMPARISON:  CT head without contrast 11/08/2017, CTA neck 08/26/2016, and earlier. FINDINGS: Brain: Chronic infarcts with encephalomalacia in the left superior frontal gyrus, right parietal lobe. Mild associated hemosiderin. Superimposed Patchy and confluent bilateral cerebral white matter T2 and FLAIR hyperintensity. Mild for age T2 heterogeneity in the bilateral deep gray matter nuclei. T2 hyperintensity in the pons greater on the right might reflect Wallerian degeneration. Cerebellum is within normal limits. No convincing restricted diffusion to suggest acute infarction. No midline shift, mass effect, evidence of mass lesion, ventriculomegaly, extra-axial collection or acute intracranial hemorrhage. Cervicomedullary junction and pituitary are within normal limits. Vascular: Loss of the right ICA flow void in the upper neck and siphon. Evidence of reconstituted flow at the right ICA terminus, probably via a right posterior communicating artery. Other Major intracranial vascular flow voids are preserved with mild  generalized  intracranial artery tortuosity. Skull and upper cervical spine: Negative visualized cervical spine. Visualized bone marrow signal is within normal limits. Sinuses/Orbits: Postoperative changes to both globes, otherwise negative orbits soft tissues. Paranasal sinuses are clear. Other: Chronic right mastoid effusion. Trace left mastoid fluid also appears chronic. Negative nasopharynx. Visible internal auditory structures appear normal. Scalp and face soft tissues appear negative. IMPRESSION: 1.  No acute intracranial abnormality. 2. Chronic right carotid occlusion. Chronic moderate-size cortical infarcts in the left frontal and right parietal lobes. 3. Moderately advanced for age bilateral cerebral white matter signal changes, most commonly due to chronic small vessel disease. Possible Wallerian degeneration in the right brainstem. 4. Chronic right mastoid effusion. Electronically Signed   By: Genevie Ann M.D.   On: 11/09/2017 08:16   Dg Chest Port 1 View  Result Date: 11/09/2017 CLINICAL DATA:  Acute onset of confusion.  Encephalopathy. EXAM: PORTABLE CHEST 1 VIEW COMPARISON:  Chest radiograph performed 08/25/2016 FINDINGS: The lungs are well-aerated and clear. There is no evidence of focal opacification, pleural effusion or pneumothorax. The cardiomediastinal silhouette is within normal limits. No acute osseous abnormalities are seen. There is chronic deformity of the proximal right humerus. IMPRESSION: No acute cardiopulmonary process seen. Electronically Signed   By: Garald Balding M.D.   On: 11/09/2017 06:46     Not all labs, radiology exams or other studies done during hospitalization come through on my EPIC note; however they are reviewed by me.    Assessment and Plan  Psychoses-patient was placed on new medications Seroquel 100 mg by mouth every morning and Seroquel 50 mg by mouth daily at bedtime; the real improvement is that patient was able to be given her meds crushed up and her  [place] SNF-admitted for residential care continue Seroquel 100 mg by mouth every morning and 50 mg by mouth daily at bedtime with all meds crushed in Mylanta which patient gets after every meal  Atrial fibrillation SNF - she will also continue 50 mg by mouth daily as prophylaxis and Coreg 3.125 by mouth twice a day for rate control  Coronary artery disease SNF - stable continue Coreg 3.125 mg by mouth twice a day, isosorbide 30 mg by mouth daily xarelto 15 mg by mouth daily, patient is on statin; patient's ASA was stopped and we will get that restarted 81 mg daily  Hypothyroidism SNF - continue Synthroid 25 g by mouth daily  Hyperlipidemia SNF - continue Zocor 10 mg by mouth daily  Insomnia SNF - continue trazodone 150 mg by mouth daily at bedtime  Depression SNF - continue Zoloft 50 mg by mouth daily at bedtime and trazodone 150 mg by mouth daily at bedtime   Time spent greater than 35 minutes;> 50% of time with patient was spent reviewing records, labs, tests and studies, counseling and developing plan of care  Inocencio Homes, MD

## 2017-12-19 ENCOUNTER — Encounter: Payer: Self-pay | Admitting: Internal Medicine

## 2017-12-19 DIAGNOSIS — F29 Unspecified psychosis not due to a substance or known physiological condition: Secondary | ICD-10-CM | POA: Insufficient documentation

## 2017-12-19 DIAGNOSIS — G4700 Insomnia, unspecified: Secondary | ICD-10-CM | POA: Insufficient documentation

## 2017-12-21 LAB — CBC AND DIFFERENTIAL
HCT: 37 (ref 36–46)
Hemoglobin: 11.7 — AB (ref 12.0–16.0)
Platelets: 162 (ref 150–399)
WBC: 5

## 2017-12-21 LAB — BASIC METABOLIC PANEL
BUN: 19 (ref 4–21)
CREATININE: 1 (ref 0.5–1.1)
Glucose: 88
POTASSIUM: 4.2 (ref 3.4–5.3)
SODIUM: 142 (ref 137–147)

## 2018-01-10 ENCOUNTER — Non-Acute Institutional Stay (SKILLED_NURSING_FACILITY): Payer: Medicare Other | Admitting: Internal Medicine

## 2018-01-10 ENCOUNTER — Encounter: Payer: Self-pay | Admitting: Internal Medicine

## 2018-01-10 DIAGNOSIS — I25119 Atherosclerotic heart disease of native coronary artery with unspecified angina pectoris: Secondary | ICD-10-CM | POA: Diagnosis not present

## 2018-01-10 DIAGNOSIS — F22 Delusional disorders: Secondary | ICD-10-CM

## 2018-01-10 DIAGNOSIS — F329 Major depressive disorder, single episode, unspecified: Secondary | ICD-10-CM

## 2018-01-10 DIAGNOSIS — F32A Depression, unspecified: Secondary | ICD-10-CM

## 2018-01-10 NOTE — Progress Notes (Signed)
Location:  [facility] Room Number: [phone] Place of Service:  SNF (31)  [name], MD  Patient Care Team: [name], MD as PCP - General (Internal Medicine)  Extended Emergency Contact Information Primary Emergency Contact: Tyner,[name] Address: [address] [place], [place] [address] [name] Work Phone: [phone] Mobile Phone: [phone] Relation: Daughter Secondary Emergency Contact: Daelynn, Blower Mobile Phone: [phone] Relation: Son    Allergies: Patient has no known allergies.  Chief Complaint  Patient presents with  . Medical Management of Chronic Issues    Routine Visit    HPI: Patient is 77 y.o. female who is being seen for routine issues of coronary artery disease, psychosis, and depression.  Past Medical History:  Diagnosis Date  . A-fib ([place]) 01/07/2012  . ANEMIA 07/29/2010   Qualifier: Diagnosis of  By: Bobby Rumpf CMA (AAMA), [place]    . Angina pectoris ([place])   . Anxiety   . Asthma   . Asthma 01/07/2012  . Barrett's esophagus   . Bronchitis   . 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 Chest pain   . Coronary artery disease    right carotid occlusion   . Dementia with behavioral disturbance 09/02/2016  . Depression   . Dysrhythmia    atrial fibrillation  . GERD (gastroesophageal reflux disease)   . 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 Hyperlipidemia   . Hypertension   . Mood disorder ([place])   . Paranoia ([place]) 10/03/2016  . Recurrent upper respiratory infection (URI)   . Stroke [facility])    2007, after left endarterectomy    Past Surgical History:  Procedure Laterality Date  . ABDOMINAL HYSTERECTOMY    . BALLOON DILATION  10/18/2012   Procedure: BALLOON DILATION;  Surgeon: Lear Ng, MD;  [facility] ENDOS[facility];  Service: Endoscopy;  Laterality: N/A;  . CARDIAC CATHETERIZATION    . CAROTID ENDARTERECTOMY     left, complicated by stroke  . CHOLECYSTECTOMY     incidental at  time of colectomy  . COLONOSCOPY  12/01/2011   Procedure: COLONOSCOPY;  Surgeon: Lear Ng, MD;  [facility];  Service: Endoscopy;  Laterality: N/A;  . COLONOSCOPY  10/18/2012   Procedure: COLONOSCOPY;  Surgeon: Lear Ng, MD;  [facility];  Service: Endoscopy;  Laterality: N/A;  colonic dilation  . ESOPHAGOGASTRODUODENOSCOPY  12/01/2011   Procedure: ESOPHAGOGASTRODUODENOSCOPY (EGD);  Surgeon: Lear Ng, MD;  Location: [facility];  Service: Endoscopy;  Laterality: N/A;  . HOT HEMOSTASIS  12/01/2011   Procedure: HOT HEMOSTASIS (ARGON PLASMA COAGULATION/BICAP);  Surgeon: Lear Ng, MD;  Location: [facility];  Service: Endoscopy;  Laterality: N/A;  . 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 Always use your most recent med list.          alendronate 70 MG tablet Commonly known as:  FOSAMAX Take 70 mg by mouth every Monday. Take with a full glass of water on an empty stomach.   budesonide-formoterol 160-4.5 MCG/ACT inhaler Commonly known as:  SYMBICORT Inhale 2 puffs into the lungs 2 (two) times daily.   carvedilol 3.125 MG tablet Commonly known as:  COREG Take 1 tablet (3.125  mg total) by mouth 2 (two) times daily with a meal.   fluticasone 50 MCG/ACT nasal spray Commonly known as:  FLONASE Place 2 sprays into both nostrils daily.   isosorbide mononitrate 30 MG 24 hr tablet Commonly known as:  IMDUR Take 30 mg by mouth daily.   levothyroxine 25 MCG tablet Commonly known as:  SYNTHROID, LEVOTHROID Take 25 mcg by mouth daily before breakfast.   mirtazapine 15 MG tablet Commonly known as:  REMERON Take 7.5 mg by mouth at bedtime. 1/2 TAB   QUEtiapine 100 MG tablet Commonly known as:  SEROQUEL Take 100 mg by mouth every morning.   QUEtiapine 50 MG  tablet Commonly known as:  SEROQUEL Take 50 mg by mouth at bedtime.   rivaroxaban 10 MG Tabs tablet Commonly known as:  XARELTO Take 15 mg by mouth daily. 1 and 1/2 tablet   sertraline 50 MG tablet Commonly known as:  ZOLOFT Take 50 mg by mouth daily.   simvastatin 10 MG tablet Commonly known as:  ZOCOR Take 10 mg by mouth at bedtime.   tiotropium 18 MCG inhalation capsule Commonly known as:  SPIRIVA Place 18 mcg into inhaler and inhale daily.   traZODone 150 MG tablet Commonly known as:  DESYREL Take 150 mg by mouth at bedtime.       No orders of the defined types were placed in this encounter.   Immunization History  Administered Date(s) Administered  . Influenza-Unspecified 08/26/2016, 10/21/2017  . PPD Test 08/16/2016  . Pneumococcal Polysaccharide-23 01/08/2012    Social History   Tobacco Use  . Smoking status: Former Smoker    Last attempt to quit: 04/09/2003    Years since quitting: 14.7  . Smokeless tobacco: Never Used  Substance Use Topics  . Alcohol use: No    Review of Systems  DATA OBTAINED: from patient, nurse GENERAL:  no fevers, fatigue, appetite changes SKIN: No itching, rash HEENT: No complaint RESPIRATORY: No cough, wheezing, SOB CARDIAC: No chest pain, palpitations, lower extremity edema  GI: No abdominal pain, No N/V/D or constipation, No heartburn or reflux  GU: No dysuria, frequency or urgency, or incontinence  MUSCULOSKELETAL: No unrelieved bone/joint pain NEUROLOGIC: No headache, dizziness  PSYCHIATRIC: No overt anxiety or sadness  Vitals:   01/10/18 1156  BP: 127/75  Pulse: 83  Resp: 18  Temp: 97.6 F (36.4 C)  SpO2: 95%   Body mass index is 27.05 kg/m. Physical Exam  GENERAL APPEARANCE: Alert, conversant, No acute distress  SKIN: No diaphoresis rash HEENT: Unremarkable RESPIRATORY: Breathing is even, unlabored. Lung sounds are clear   CARDIOVASCULAR: Heart RRR no murmurs, rubs or gallops. No peripheral edema   GASTROINTESTINAL: Abdomen is soft, non-tender, not distended w/ normal bowel sounds.  GENITOURINARY: Bladder non tender, not distended  MUSCULOSKELETAL: No abnormal joints or musculature NEUROLOGIC: Cranial nerves 2-12 grossly intact. Moves all extremities PSYCHIATRIC: Mood and affect appropriate to situation with some dementia, occasionally patient can be ornery, no behavioral issues  Patient Active Problem List   Diagnosis Date Noted  . 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 Insomnia 12/19/2017  . Urinary tract infection due to extended-spectrum beta lactamase (ESBL) producing Escherichia coli 11/17/2017  . Dysphagia, oropharyngeal 11/15/2017  . Altered mental status 11/10/2017  . Acute metabolic encephalopathy 16/08/9603  . Mania ([place]) 06/21/2017  . Hypomagnesemia 06/19/2017  . Essential hypertension 06/18/2017  . Acute encephalopathy 06/17/2017  . Acute lower UTI 06/17/2017  . E. coli UTI 06/17/2017  . Depression 04/24/2017  . Hypothyroidism 10/03/2016  .  Paranoia ([place]) 10/03/2016  . LOC (loss of consciousness) ([place]) 09/02/2016  . 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 Dementia with behavioral disturbance 09/02/2016  . Asthma with acute exacerbation 08/21/2016  . Atrial fibrillation with RVR ([place]) 08/21/2016  . Elevated INR 08/21/2016  . Acute on chronic systolic CHF (congestive heart failure) ([place]) 08/21/2016  . Hyperlipidemia 08/21/2016  . Electrolyte abnormality 08/21/2016  . HCAP (healthcare-associated pneumonia) 08/10/2016  . Sepsis ([place]) 08/09/2016  . Coronary atherosclerosis of native coronary artery 01/21/2014  . Obesity 01/17/2014  . Abdominal pain, generalized 10/18/2012  . GERD (gastroesophageal reflux disease)   . Mood disorder ([place])   . Anxiety   . 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[place])   . Stroke ([place])   . Chest pain 01/07/2012  . Asthma 01/07/2012  . A-fib ([place]) 01/07/2012  . History of CVA with residual deficit  01/07/2012  . Anemia 01/07/2012  . ANEMIA 07/29/2010  . BARRETTS ESOPHAGUS 06/04/2010    CMP     Component Value Date/Time   NA 142 12/21/2017   K 4.2 12/21/2017   CL 112 (H) 11/09/2017 0822   CO2 23 11/09/2017 0822   GLUCOSE 109 (H) 11/09/2017 0822   BUN 19 12/21/2017   CREATININE 1.0 12/21/2017   CREATININE 0.86 11/09/2017 0822   CALCIUM 9.5 11/09/2017 0822   PROT 6.9 11/08/2017 1512   ALBUMIN 3.8 11/08/2017 1512   AST 18 11/08/2017 1512   ALT 20 11/08/2017 1512   ALKPHOS 64 11/08/2017 1512   BILITOT 0.9 11/08/2017 1512   GFRNONAA >60 11/09/2017 0822   GFRAA >60 11/09/2017 0822   Recent Labs    06/18/17 0346 06/19/17 0431 11/08/17 1512 11/09/17 0822 11/15/17 12/21/17  NA 140 138 141 143 137 142  K 3.7 3.5 3.7 3.6 3.9 4.2  CL 107 107 105 112*  --   --   CO2 24 24 26 23   --   --   GLUCOSE 100* 102* 150* 109*  --   --   BUN 9 10 20 16 11 19   CREATININE 0.94 0.94 0.94 0.86 0.8 1.0  CALCIUM 8.9 8.9 9.9 9.5  --   --   MG 1.7 1.6*  --   --   --   --   PHOS 3.4 3.0  --   --   --   --    Recent Labs    06/18/17 0346 06/19/17 0431 11/08/17 1512  AST 14* 16 18  ALT 10* 14 20  ALKPHOS 52 53 64  BILITOT 0.5 0.7 0.9  PROT 5.6* 5.7* 6.9  ALBUMIN 3.1* 3.1* 3.8   Recent Labs    06/18/17 0346 06/19/17 0431 11/08/17 1512 11/09/17 0822 11/15/17 12/21/17  WBC 5.6 6.5 7.0 6.4 9.3 5.0  NEUTROABS 3.6 4.2 5.4  --   --   --   HGB 11.8* 12.2 13.8 12.8 13.6 11.7*  HCT 36.0 37.3 42.8 40.0 41 37  MCV 84.7 84.8 87.2 87.9  --   --   PLT 153 147* 233 209 193 162   Recent Labs    06/19/17 0433  CHOL 109  LDLCALC 37  TRIG 68   No results found for: [facility] Lab Results  Component Value Date   TSH 1.680 11/08/2017   Lab Results  Component Value Date   HGBA1C 5.4 04/08/2017   Lab Results  Component Value Date   CHOL 109 06/19/2017   HDL 58 06/19/2017   LDLCALC 37 06/19/2017  TRIG 68 06/19/2017   CHOLHDL 1.9 06/19/2017    Significant Diagnostic Results in  last 30 days:  No results found.  Assessment and Plan  Coronary atherosclerosis of native coronary artery Stable without complaints of chest pain; continue Imdur 30 mg by mouth daily, Coreg 3.125 mg by mouth twice a day and xarelto15 mg daily; patient is on statin  Paranoia ([place]) Much improvement on the patient has been at a psych facility; plan to continue regimen of Seroquel 100 mg every morning and 50 mg daily at bedtime  Depression Improved since Patient returned from psych admission; continue trazodone 150 mg by mouth daily at bedtime and Zoloft 50 mg by mouth daily at bedtime    Webb Silversmith D. [name], MD

## 2018-01-15 ENCOUNTER — Encounter: Payer: Self-pay | Admitting: Internal Medicine

## 2018-01-15 NOTE — Assessment & Plan Note (Signed)
Stable without complaints of chest pain; continue Imdur 30 mg by mouth daily, Coreg 3.125 mg by mouth twice a day and xarelto15 mg daily; patient is on statin

## 2018-01-15 NOTE — Assessment & Plan Note (Signed)
Much improvement on the patient has been at a psych facility; plan to continue regimen of Seroquel 100 mg every morning and 50 mg daily at bedtime

## 2018-01-15 NOTE — Assessment & Plan Note (Signed)
Improved since Patient returned from psych admission; continue trazodone 150 mg by mouth daily at bedtime and Zoloft 50 mg by mouth daily at bedtime

## 2018-02-03 ENCOUNTER — Non-Acute Institutional Stay (SKILLED_NURSING_FACILITY): Payer: Medicare Other | Admitting: Internal Medicine

## 2018-02-03 DIAGNOSIS — I5022 Chronic systolic (congestive) heart failure: Secondary | ICD-10-CM

## 2018-02-03 DIAGNOSIS — F39 Unspecified mood [affective] disorder: Secondary | ICD-10-CM | POA: Diagnosis not present

## 2018-02-03 DIAGNOSIS — F0281 Dementia in other diseases classified elsewhere with behavioral disturbance: Secondary | ICD-10-CM | POA: Diagnosis not present

## 2018-02-03 DIAGNOSIS — I110 Hypertensive heart disease with heart failure: Secondary | ICD-10-CM

## 2018-02-03 DIAGNOSIS — F02818 Dementia in other diseases classified elsewhere, unspecified severity, with other behavioral disturbance: Secondary | ICD-10-CM

## 2018-02-03 DIAGNOSIS — G301 Alzheimer's disease with late onset: Secondary | ICD-10-CM

## 2018-02-05 ENCOUNTER — Encounter: Payer: Self-pay | Admitting: Internal Medicine

## 2018-02-05 NOTE — Progress Notes (Signed)
Location:  [place] of Service:  SNF (31)  [name], MD  Patient Care Team: [name], MD as PCP - General (Internal Medicine)  Extended Emergency Contact Information Primary Emergency Contact: Tyner,[name] Address: [address] [place], [place] [address] [name] Work Phone: [phone] Mobile Phone: [phone] Relation: Daughter Secondary Emergency Contact: Martin, Smeal Mobile Phone: [phone] Relation: Son    Allergies: Patient has no known allergies.  Chief Complaint  Patient presents with  . Medical Management of Chronic Issues    HPI: Patient is 77 y.o. female who is being seen for routine issues of hypertension, mood disorder, and dementia.  Past Medical History:  Diagnosis Date  . A-fib ([place]) 01/07/2012  . ANEMIA 07/29/2010   Qualifier: Diagnosis of  By: Bobby Rumpf CMA (AAMA), [place]    . Angina pectoris ([place])   . Anxiety   . Asthma   . Asthma 01/07/2012  . Barrett's esophagus   . Bronchitis   . 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 Chest pain   . Coronary artery disease    right carotid occlusion   . Dementia with behavioral disturbance 09/02/2016  . Depression   . Dysrhythmia    atrial fibrillation  . GERD (gastroesophageal reflux disease)   . 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 Hyperlipidemia   . Hypertension   . Mood disorder ([place])   . Paranoia ([place]) 10/03/2016  . Recurrent upper respiratory infection (URI)   . Stroke [facility])    2007, after left endarterectomy    Past Surgical History:  Procedure Laterality Date  . ABDOMINAL HYSTERECTOMY    . BALLOON DILATION  10/18/2012   Procedure: BALLOON DILATION;  Surgeon: Lear Ng, MD;  [facility];  Service: Endoscopy;  Laterality: N/A;  . CARDIAC CATHETERIZATION    . CAROTID ENDARTERECTOMY     left, complicated by stroke  . CHOLECYSTECTOMY     incidental at time of colectomy  . COLONOSCOPY  12/01/2011   Procedure:  COLONOSCOPY;  Surgeon: Lear Ng, MD;  [facility];  Service: Endoscopy;  Laterality: N/A;  . COLONOSCOPY  10/18/2012   Procedure: COLONOSCOPY;  Surgeon: Lear Ng, MD;  [facility];  Service: Endoscopy;  Laterality: N/A;  colonic dilation  . ESOPHAGOGASTRODUODENOSCOPY  12/01/2011   Procedure: ESOPHAGOGASTRODUODENOSCOPY (EGD);  Surgeon: Lear Ng, MD;  Location: [facility];  Service: Endoscopy;  Laterality: N/A;  . HOT HEMOSTASIS  12/01/2011   Procedure: HOT HEMOSTASIS (ARGON PLASMA COAGULATION/BICAP);  Surgeon: Lear Ng, MD;  Location: [facility];  Service: Endoscopy;  Laterality: N/A;  . 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 Always use your most recent med list.          alendronate 70 MG tablet Commonly known as:  FOSAMAX Take 70 mg by mouth every Monday. Take with a full glass of water on an empty stomach.   budesonide-formoterol 160-4.5 MCG/ACT inhaler Commonly known as:  SYMBICORT Inhale 2 puffs into the lungs 2 (two) times daily.   carvedilol 3.125 MG tablet Commonly known as:  COREG Take 1 tablet (3.125 mg total) by mouth 2 (two) times daily with  a meal.   fluticasone 50 MCG/ACT nasal spray Commonly known as:  FLONASE Place 2 sprays into both nostrils daily.   isosorbide mononitrate 30 MG 24 hr tablet Commonly known as:  IMDUR Take 30 mg by mouth daily.   levothyroxine 25 MCG tablet Commonly known as:  SYNTHROID, LEVOTHROID Take 25 mcg by mouth daily before breakfast.   mirtazapine 15 MG tablet Commonly known as:  REMERON Take 7.5 mg by mouth at bedtime. 1/2 TAB   QUEtiapine 100 MG tablet Commonly known as:  SEROQUEL Take 100 mg by mouth every morning.   QUEtiapine 50 MG tablet Commonly known as:  SEROQUEL Take 50 mg by mouth at bedtime.     rivaroxaban 10 MG Tabs tablet Commonly known as:  XARELTO Take 15 mg by mouth daily. 1 and 1/2 tablet   sertraline 50 MG tablet Commonly known as:  ZOLOFT Take 50 mg by mouth daily.   simvastatin 10 MG tablet Commonly known as:  ZOCOR Take 10 mg by mouth at bedtime.   tiotropium 18 MCG inhalation capsule Commonly known as:  SPIRIVA Place 18 mcg into inhaler and inhale daily.   traZODone 150 MG tablet Commonly known as:  DESYREL Take 150 mg by mouth at bedtime.       No orders of the defined types were placed in this encounter.   Immunization History  Administered Date(s) Administered  . Influenza-Unspecified 08/26/2016, 10/21/2017  . PPD Test 08/16/2016  . Pneumococcal Polysaccharide-23 01/08/2012    Social History   Tobacco Use  . Smoking status: Former Smoker    Last attempt to quit: 04/09/2003    Years since quitting: 14.8  . Smokeless tobacco: Never Used  Substance Use Topics  . Alcohol use: No    Review of Systems  DATA OBTAINED: from patient, nurse GENERAL:  no fevers, fatigue, appetite changes SKIN: No itching, rash HEENT: No complaint RESPIRATORY: No cough, wheezing, SOB CARDIAC: No chest pain, palpitations, lower extremity edema  GI: No abdominal pain, No N/V/D or constipation, No heartburn or reflux  GU: No dysuria, frequency or urgency, or incontinence  MUSCULOSKELETAL: No unrelieved bone/joint pain NEUROLOGIC: No headache, dizziness  PSYCHIATRIC: No overt anxiety or sadness  Vitals:   02/05/18 1834  BP: (!) 145/79  Pulse: 65  Resp: 18  Temp: 97.6 F (36.4 C)   There is no height or weight on file to calculate BMI. Physical Exam  GENERAL APPEARANCE: Alert, conversant, No acute distress  SKIN: No diaphoresis rash HEENT: Unremarkable RESPIRATORY: Breathing is even, unlabored. Lung sounds are clear   CARDIOVASCULAR: Heart RRR no murmurs, rubs or gallops. No peripheral edema  GASTROINTESTINAL: Abdomen is soft, non-tender, not distended  w/ normal bowel sounds.  GENITOURINARY: Bladder non tender, not distended  MUSCULOSKELETAL: No abnormal joints or musculature NEUROLOGIC: Cranial nerves 2-12 grossly intact. Moves all extremities PSYCHIATRIC: Mood and affect appropriate to situation, no behavioral issues  Patient Active Problem List   Diagnosis Date Noted  . 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 Insomnia 12/19/2017  . Urinary tract infection due to extended-spectrum beta lactamase (ESBL) producing Escherichia coli 11/17/2017  . Dysphagia, oropharyngeal 11/15/2017  . Altered mental status 11/10/2017  . Acute metabolic encephalopathy 66/44/0347  . Mania ([place]) 06/21/2017  . Hypomagnesemia 06/19/2017  . Essential hypertension 06/18/2017  . Acute encephalopathy 06/17/2017  . Acute lower UTI 06/17/2017  . E. coli UTI 06/17/2017  . Depression 04/24/2017  . Hypothyroidism 10/03/2016  . Paranoia ([place]) 10/03/2016  . LOC (loss of consciousness) ([place]) 09/02/2016  .  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 Dementia with behavioral disturbance 09/02/2016  . Asthma with acute exacerbation 08/21/2016  . Atrial fibrillation with RVR ([place]) 08/21/2016  . Elevated INR 08/21/2016  . Acute on chronic systolic CHF (congestive heart failure) ([place]) 08/21/2016  . Hyperlipidemia 08/21/2016  . Electrolyte abnormality 08/21/2016  . HCAP (healthcare-associated pneumonia) 08/10/2016  . Sepsis ([place]) 08/09/2016  . Coronary atherosclerosis of native coronary artery 01/21/2014  . Obesity 01/17/2014  . Abdominal pain, generalized 10/18/2012  . GERD (gastroesophageal reflux disease)   . Mood disorder ([place])   . Anxiety   . 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[place])   . Stroke ([place])   . Chest pain 01/07/2012  . Asthma 01/07/2012  . A-fib ([place]) 01/07/2012  . 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 Anemia 01/07/2012  . ANEMIA 07/29/2010  . BARRETTS ESOPHAGUS 06/04/2010    CMP     Component Value  Date/Time   NA 142 12/21/2017   K 4.2 12/21/2017   CL 112 (H) 11/09/2017 0822   CO2 23 11/09/2017 0822   GLUCOSE 109 (H) 11/09/2017 0822   BUN 19 12/21/2017   CREATININE 1.0 12/21/2017   CREATININE 0.86 11/09/2017 0822   CALCIUM 9.5 11/09/2017 0822   PROT 6.9 11/08/2017 1512   ALBUMIN 3.8 11/08/2017 1512   AST 18 11/08/2017 1512   ALT 20 11/08/2017 1512   ALKPHOS 64 11/08/2017 1512   BILITOT 0.9 11/08/2017 1512   GFRNONAA >60 11/09/2017 0822   GFRAA >60 11/09/2017 0822   Recent Labs    06/18/17 0346 06/19/17 0431 11/08/17 1512 11/09/17 0822 11/15/17 12/21/17  NA 140 138 141 143 137 142  K 3.7 3.5 3.7 3.6 3.9 4.2  CL 107 107 105 112*  --   --   CO2 24 24 26 23   --   --   GLUCOSE 100* 102* 150* 109*  --   --   BUN 9 10 20 16 11 19   CREATININE 0.94 0.94 0.94 0.86 0.8 1.0  CALCIUM 8.9 8.9 9.9 9.5  --   --   MG 1.7 1.6*  --   --   --   --   PHOS 3.4 3.0  --   --   --   --    Recent Labs    06/18/17 0346 06/19/17 0431 11/08/17 1512  AST 14* 16 18  ALT 10* 14 20  ALKPHOS 52 53 64  BILITOT 0.5 0.7 0.9  PROT 5.6* 5.7* 6.9  ALBUMIN 3.1* 3.1* 3.8   Recent Labs    06/18/17 0346 06/19/17 0431 11/08/17 1512 11/09/17 0822 11/15/17 12/21/17  WBC 5.6 6.5 7.0 6.4 9.3 5.0  NEUTROABS 3.6 4.2 5.4  --   --   --   HGB 11.8* 12.2 13.8 12.8 13.6 11.7*  HCT 36.0 37.3 42.8 40.0 41 37  MCV 84.7 84.8 87.2 87.9  --   --   PLT 153 147* 233 209 193 162   Recent Labs    06/19/17 0433  CHOL 109  LDLCALC 37  TRIG 68   No results found for: [facility] Lab Results  Component Value Date   TSH 1.680 11/08/2017   Lab Results  Component Value Date   HGBA1C 5.4 04/08/2017   Lab Results  Component Value Date   CHOL 109 06/19/2017   HDL 58 06/19/2017   LDLCALC 37 06/19/2017   TRIG 68 06/19/2017   CHOLHDL 1.9 06/19/2017    Significant Diagnostic  Results in last 30 days:  No results found.  Assessment and Plan  Hypertensive heart disease with CHF (congestive heart  failure) ([place]) Controlled; continue indoor 30 mg daily and Coreg 3.125 mg twice a day  Mood disorder ([place]) Appears controlled after patient's inpatient hospitalization for same; continue Seroquel 100 mg by mouth daily at bedtime and 50 mg by mouth every morning  Dementia with behavioral disturbance Stable; continue supportive care    Inocencio Homes, MD

## 2018-02-05 NOTE — Assessment & Plan Note (Signed)
Controlled; continue indoor 30 mg daily and Coreg 3.125 mg twice a day

## 2018-02-05 NOTE — Assessment & Plan Note (Signed)
Stable; continue supportive care 

## 2018-02-05 NOTE — Assessment & Plan Note (Signed)
Appears controlled after patient's inpatient hospitalization for same; continue Seroquel 100 mg by mouth daily at bedtime and 50 mg by mouth every morning

## 2018-03-23 ENCOUNTER — Non-Acute Institutional Stay (SKILLED_NURSING_FACILITY): Payer: Medicare Other | Admitting: Adult Health

## 2018-03-23 ENCOUNTER — Encounter: Payer: Self-pay | Admitting: Adult Health

## 2018-03-23 DIAGNOSIS — E034 Atrophy of thyroid (acquired): Secondary | ICD-10-CM | POA: Diagnosis not present

## 2018-03-23 DIAGNOSIS — I110 Hypertensive heart disease with heart failure: Secondary | ICD-10-CM | POA: Diagnosis not present

## 2018-03-23 DIAGNOSIS — J45909 Unspecified asthma, uncomplicated: Secondary | ICD-10-CM | POA: Diagnosis not present

## 2018-03-23 DIAGNOSIS — E782 Mixed hyperlipidemia: Secondary | ICD-10-CM | POA: Diagnosis not present

## 2018-03-23 DIAGNOSIS — I5022 Chronic systolic (congestive) heart failure: Secondary | ICD-10-CM

## 2018-03-23 DIAGNOSIS — I4891 Unspecified atrial fibrillation: Secondary | ICD-10-CM | POA: Diagnosis not present

## 2018-03-23 DIAGNOSIS — I25119 Atherosclerotic heart disease of native coronary artery with unspecified angina pectoris: Secondary | ICD-10-CM

## 2018-03-23 NOTE — Progress Notes (Signed)
Location:   [facility] Room Number: [place] of Service:  SNF (31)   CODE STATUS: DNR  No Known Allergies  Chief Complaint  Patient presents with  . Medical Management of Chronic Issues    Hypertension; afib; cad;     HPI:  She is a 77 year old long term resident of this facility being seen for the management of her chronic illnesses: hypertension; afib and cad. She denies any chest pain; no palpitation; no headaches. There are no nursing concerns at this time.   Past Medical History:  Diagnosis Date  . A-fib ([place]) 01/07/2012  . ANEMIA 07/29/2010   Qualifier: Diagnosis of  By: Bobby Rumpf CMA (AAMA), [place]    . Angina pectoris ([place])   . Anxiety   . Asthma   . Asthma 01/07/2012  . Barrett's esophagus   . Bronchitis   . 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 Chest pain   . Coronary artery disease    right carotid occlusion   . Dementia with behavioral disturbance 09/02/2016  . Depression   . Dysrhythmia    atrial fibrillation  . GERD (gastroesophageal reflux disease)   . 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 Hyperlipidemia   . Hypertension   . Mood disorder ([place])   . Paranoia ([place] ) 10/03/2016  . Recurrent upper respiratory infection (URI)   . Stroke [facility])    2007, after left endarterectomy    Past Surgical History:  Procedure Laterality Date  . ABDOMINAL HYSTERECTOMY    . BALLOON DILATION  10/18/2012   Procedure: BALLOON DILATION;  Surgeon: Lear Ng, MD;  Location: [facility] ENDOSCOPY;  Service: Endoscopy;  Laterality: N/A;  . CARDIAC CATHETERIZATION    . CAROTID ENDARTERECTOMY     left, complicated by stroke  . CHOLECYSTECTOMY     incidental at time of colectomy  . COLONOSCOPY  12/01/2011   Procedure: COLONOSCOPY;  Surgeon: Lear Ng, MD;  Location: [facility] ENDOSCOPY;  Service: Endoscopy;  Laterality: N/A;  . COLONOSCOPY  10/18/2012   Procedure: COLONOSCOPY;  Surgeon: Lear Ng, MD;  Location: [facility] ENDOSCOPY;  Service:  Endoscopy;  Laterality: N/A;  colonic dilation  . ESOPHAGOGASTRODUODENOSCOPY  12/01/2011   Procedure: ESOPHAGOGASTRODUODENOSCOPY (EGD);  Surgeon: Lear Ng, MD;  Location: [facility];  Service: Endoscopy;  Laterality: N/A;  . HOT HEMOSTASIS  12/01/2011   Procedure: HOT HEMOSTASIS (ARGON PLASMA COAGULATION/BICAP);  Surgeon: Lear Ng, MD;  Location: [facility];  Service: Endoscopy;  Laterality: N/A;  . 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 Marital status: Divorced    Spouse name: Not on file  . Number of children: Not on file  . Years of education: Not on file  . Highest education level: Not on file  Occupational History  . Occupation: retired Tour manager  . Financial resource strain: Not on file  . Food insecurity:    Worry: Not on file    Inability: Not on file  . Transportation needs:    Medical: Not on file    Non-medical: Not on file  Tobacco Use  . Smoking status: Former Smoker    Last attempt to quit: 04/09/2003    Years since quitting: 14.9  . Smokeless tobacco: Never Used  Substance and Sexual Activity  . Alcohol use: No  . Drug use: No  .  Sexual activity: Never  Lifestyle  . Physical activity:    Days per week: Not on file    Minutes per session: Not on file  . Stress: Not on file  Relationships  . Social connections:    Talks on phone: Not on file    Gets together: Not on file    Attends religious service: Not on file    Active member of club or organization: Not on file    Attends meetings of clubs or organizations: Not on file    Relationship status: Not on file  . Intimate partner violence:    Fear of current or ex partner: Not on file    Emotionally abused: Not on file    Physically abused: Not on file    Forced sexual activity: Not on file  Other Topics Concern  . Not on file  Social History Narrative   Admitted to [facility]  08/16/16   Divorced   Former smoker-stopped 2004   Alcohol none   DNR   Family History  Problem Relation Age of Onset  . Heart disease Mother   . Cancer Mother   . Heart attack Father   . Cancer Sister        ovarian      VITAL SIGNS BP 133/65   Pulse 96   Temp 98.2 F (36.8 C)   Resp 18   Ht 5' (1.524 m)   Wt 147 lb 9.6 oz (67 kg)   BMI 28.83 kg/m   Outpatient Encounter Medications as of 03/23/2018  Medication Sig  . alendronate (FOSAMAX) 70 MG tablet Take 70 mg by mouth every Wednesday. Take with a full glass of water on an empty stomach.  . budesonide-formoterol (SYMBICORT) 160-4.5 MCG/ACT inhaler Inhale 2 puffs into the lungs 2 (two) times daily.  . carvedilol (COREG) 3.125 MG tablet Take 1 tablet (3.125 mg total) by mouth 2 (two) times daily with a meal.  . fluticasone (FLONASE) 50 MCG/ACT nasal spray Place 2 sprays into both nostrils daily.  . isosorbide mononitrate (IMDUR) 30 MG 24 hr tablet Take 30 mg by mouth daily.  Marland Kitchen levothyroxine (SYNTHROID, LEVOTHROID) 25 MCG tablet Take 25 mcg by mouth daily before breakfast.   . mirtazapine (REMERON) 15 MG tablet Take 7.5 mg by mouth at bedtime. 1/2 TAB  . QUEtiapine (SEROQUEL) 100 MG tablet Take 100 mg by mouth every morning.  Marland Kitchen QUEtiapine (SEROQUEL) 50 MG tablet Take 50 mg by mouth at bedtime.  . rivaroxaban (XARELTO) 10 MG TABS tablet Take 15 mg by mouth daily. 1 and 1/2 tablet  . sertraline (ZOLOFT) 50 MG tablet Take 50 mg by mouth daily.   . simvastatin (ZOCOR) 10 MG tablet Take 10 mg by mouth at bedtime.  Marland Kitchen tiotropium (SPIRIVA) 18 MCG inhalation capsule Place 18 mcg into inhaler and inhale daily.  . traZODone (DESYREL) 150 MG tablet Take 150 mg by mouth at bedtime.    No facility-administered encounter medications on file as of 03/23/2018.      SIGNIFICANT DIAGNOSTIC EXAMS   LABS REVIEWED TODAY:   12-21-17: wbc 5.0; hgb 11.7; hct 37; plt 162; glucose 88; bun 19; creat 1.0; k+ 4.2; na++ 142    Review of Systems   Constitutional: Negative for malaise/fatigue.  Respiratory: Negative for cough and shortness of breath.   Cardiovascular: Negative for chest pain, palpitations and leg swelling.  Gastrointestinal: Negative for abdominal pain, constipation and heartburn.  Musculoskeletal: Negative for back pain, joint pain and myalgias.  Skin:  Negative.   Neurological: Negative for dizziness.  Psychiatric/Behavioral: Positive for depression. The patient is not nervous/anxious.     Physical Exam  Constitutional: She is oriented to person, place, and time. She appears well-developed and well-nourished. No distress.  Neck: No thyromegaly present.  Cardiovascular: Normal rate, regular rhythm, normal heart sounds and intact distal pulses.  Pulmonary/Chest: Effort normal and breath sounds normal. No respiratory distress.  Abdominal: Soft. Bowel sounds are normal. She exhibits no distension. There is no tenderness.  Musculoskeletal: Normal range of motion.  Lymphadenopathy:    She has no cervical adenopathy.  Neurological: She is alert and oriented to person, place, and time.  Skin: Skin is warm and dry. She is not diaphoretic.  Psychiatric:  Has depressed affect       ASSESSMENT/ PLAN:  TODAY:   1. Chronic systolic heart failure: is stable will continue coreg 3.125 mg twice daily   2.  Hypertensive heart disease with CHF: stable b/p 133/65: will continue coreg 3.125 mg twice daily   3. Atrial fibrillation with RVR: heart rate stable will continue coreg 3.125 mg twice daily for rate control and xarelto 15 mg twice daily   4. Dysphagia, oropharyngeal: no signs of aspiration present will continue thin liquids.   5. Coronary atherosclerosis of native coronary artery: stable will continue imdur 30 mg daily   6. Asthma: stable will continue symbicort 160/4.5 mcg 2 puffs twice daily and flonase daily spiriva daily   7. Hypothyroidism due to acquired atrophy of thyroid: stable will continue synthroid 25  mcg daily   8. Dementia with behavioral disturbance: without change in status; will monitor   9. Mixed hyperlipidemia: stable will continue zocor 10 mg daily   10. Osteoporosis: stable will continue fosamax 70 mg weekly  11. Depression, paranoia; psychosis: without change in status: will continue remeron 7.5 mg nightly zoloft 50 mg daily trazodone 150 mg nightly and seroquel 100 mg in the AM and 50 mg in the PM       MD is aware of resident's narcotic use and is in agreement with current plan of care. We will attempt to wean resident as apropriate   Ok Edwards NP [facility]  Contact [phone] Monday through Friday 8am- 5pm  After hours call [phone]

## 2018-03-24 ENCOUNTER — Non-Acute Institutional Stay (SKILLED_NURSING_FACILITY): Payer: Medicare Other | Admitting: Internal Medicine

## 2018-03-24 ENCOUNTER — Encounter: Payer: Self-pay | Admitting: Internal Medicine

## 2018-03-24 DIAGNOSIS — K219 Gastro-esophageal reflux disease without esophagitis: Secondary | ICD-10-CM

## 2018-03-28 ENCOUNTER — Non-Acute Institutional Stay (SKILLED_NURSING_FACILITY): Payer: Medicare Other | Admitting: Internal Medicine

## 2018-03-28 DIAGNOSIS — F602 Antisocial personality disorder: Secondary | ICD-10-CM | POA: Diagnosis not present

## 2018-03-28 DIAGNOSIS — R8271 Bacteriuria: Secondary | ICD-10-CM | POA: Diagnosis not present

## 2018-04-19 ENCOUNTER — Encounter: Payer: Self-pay | Admitting: Internal Medicine

## 2018-04-19 ENCOUNTER — Non-Acute Institutional Stay (SKILLED_NURSING_FACILITY): Payer: Medicare Other | Admitting: Internal Medicine

## 2018-04-19 DIAGNOSIS — F329 Major depressive disorder, single episode, unspecified: Secondary | ICD-10-CM

## 2018-04-19 DIAGNOSIS — F22 Delusional disorders: Secondary | ICD-10-CM | POA: Diagnosis not present

## 2018-04-19 DIAGNOSIS — F23 Brief psychotic disorder: Secondary | ICD-10-CM

## 2018-04-19 DIAGNOSIS — F32A Depression, unspecified: Secondary | ICD-10-CM

## 2018-04-19 NOTE — Progress Notes (Signed)
Location:  [facility] Room Number: 305-W Place of Service:  SNF (31)  [name], MD  Patient Care Team: [name], MD as PCP - General (Internal Medicine)  Extended Emergency Contact Information Primary Emergency Contact: Tyner,[name] Address: [address] [place], [place] [address] [name] Work Phone: [phone] Mobile Phone: [phone] Relation: Daughter Secondary Emergency Contact: Evi, [place] Mobile Phone: [phone] Relation: Son    Allergies: Patient has no known allergies.  Chief Complaint  Patient presents with  . Medical Management of Chronic Issues    Routine Visit     HPI: Patient is 77 y.o. female who is being seen for routine issues of psychosis, paranoia, and depression.  Past Medical History:  Diagnosis Date  . A-fib ([place]) 01/07/2012  . ANEMIA 07/29/2010   Qualifier: Diagnosis of  By: Bobby Rumpf CMA (AAMA), [place]    . Angina pectoris ([place])   . Anxiety   . Asthma   . Asthma 01/07/2012  . Barrett's esophagus   . Bronchitis   . 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 Chest pain   . Coronary artery disease    right carotid occlusion   . Dementia with behavioral disturbance 09/02/2016  . Depression   . Dysrhythmia    atrial fibrillation  . GERD (gastroesophageal reflux disease)   . 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 Hyperlipidemia   . Hypertension   . Mood disorder ([place])   . Paranoia ([place]) 10/03/2016  . Recurrent upper respiratory infection (URI)   . Stroke [facility])    2007, after left endarterectomy    Past Surgical History:  Procedure Laterality Date  . ABDOMINAL HYSTERECTOMY    . BALLOON DILATION  10/18/2012   Procedure: BALLOON DILATION;  Surgeon: Lear Ng, MD;  Location: [facility] ENDOSCOPY;  Service: Endoscopy;  Laterality: N/A;  . CARDIAC CATHETERIZATION    . CAROTID ENDARTERECTOMY     left, complicated by stroke  . CHOLECYSTECTOMY     incidental at time of  colectomy  . COLONOSCOPY  12/01/2011   Procedure: COLONOSCOPY;  Surgeon: Lear Ng, MD;  Location: [facility] ENDOSCOPY;  Service: Endoscopy;  Laterality: N/A;  . COLONOSCOPY  10/18/2012   Procedure: COLONOSCOPY;  Surgeon: Lear Ng, MD;  Location: [facility] ENDOSCOPY;  Service: Endoscopy;  Laterality: N/A;  colonic dilation  . ESOPHAGOGASTRODUODENOSCOPY  12/01/2011   Procedure: ESOPHAGOGASTRODUODENOSCOPY (EGD);  Surgeon: Lear Ng, MD;  Location: [facility];  Service: Endoscopy;  Laterality: N/A;  . HOT HEMOSTASIS  12/01/2011   Procedure: HOT HEMOSTASIS (ARGON PLASMA COAGULATION/BICAP);  Surgeon: Lear Ng, MD;  Location: [facility];  Service: Endoscopy;  Laterality: N/A;  . 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 Always use your most recent med list.          alendronate 70 MG tablet Commonly known as:  FOSAMAX Take 70 mg by mouth every Wednesday. Take with a full glass of water on an empty stomach.   budesonide-formoterol 160-4.5 MCG/ACT inhaler Commonly known as:  SYMBICORT Inhale 2 puffs into the lungs 2 (two) times daily.   carvedilol 3.125 MG tablet Commonly known as:  COREG Take 1 tablet (  3.125 mg total) by mouth 2 (two) times daily with a meal.   fluticasone 50 MCG/ACT nasal spray Commonly known as:  FLONASE Place 1 spray into both nostrils daily.   isosorbide mononitrate 30 MG 24 hr tablet Commonly known as:  IMDUR Take 30 mg by mouth daily.   levothyroxine 25 MCG tablet Commonly known as:  SYNTHROID, LEVOTHROID Take 25 mcg by mouth daily before breakfast.   mirtazapine 15 MG tablet Commonly known as:  REMERON Take 7.5 mg by mouth at bedtime. 1/2 TAB   PROTONIX 40 mg/20 mL Pack Generic drug:  pantoprazole sodium Mix 1 packet on 1 teaspoonful of applesauce or 5 mL and take daily,  DO NOT ADMINISTER WITH ANY OTHER LIQUIDS OR FOODS   QUEtiapine 100 MG tablet Commonly known as:  SEROQUEL Take 100 mg by mouth every morning.   QUEtiapine 25 MG tablet Commonly known as:  SEROQUEL Take 75 mg by mouth at bedtime.   rivaroxaban 10 MG Tabs tablet Commonly known as:  XARELTO Take 15 mg by mouth daily. 1 and 1/2 tablet   sertraline 50 MG tablet Commonly known as:  ZOLOFT Take 50 mg by mouth daily.   simvastatin 10 MG tablet Commonly known as:  ZOCOR Take 10 mg by mouth at bedtime.   tiotropium 18 MCG inhalation capsule Commonly known as:  SPIRIVA Place 18 mcg into inhaler and inhale daily.   traZODone 150 MG tablet Commonly known as:  DESYREL Take 150 mg by mouth at bedtime.       No orders of the defined types were placed in this encounter.   Immunization History  Administered Date(s) Administered  . Influenza-Unspecified 08/26/2016, 10/21/2017  . PPD Test 08/16/2016  . Pneumococcal Polysaccharide-23 01/08/2012    Social History   Tobacco Use  . Smoking status: Former Smoker    Last attempt to quit: 04/09/2003    Years since quitting: 15.1  . Smokeless tobacco: Never Used  Substance Use Topics  . Alcohol use: No    Review of Systems  DATA OBTAINED: from patient, nurse GENERAL:  no fevers, fatigue, appetite changes SKIN: No itching, rash HEENT: No complaint RESPIRATORY: No cough, wheezing, SOB CARDIAC: No chest pain, palpitations, lower extremity edema  GI: No abdominal pain, No N/V/D or constipation, No heartburn or reflux  GU: No dysuria, frequency or urgency, or incontinence  MUSCULOSKELETAL: No unrelieved bone/joint pain NEUROLOGIC: No headache, dizziness  PSYCHIATRIC: No overt anxiety or sadness  Vitals:   04/19/18 0940  BP: 124/84  Pulse: 84  Resp: 18  Temp: 97.6 F (36.4 C)  SpO2: 96%   Body mass index is 27.15 kg/m. Physical Exam  GENERAL APPEARANCE: Alert, conversant, No acute distress  SKIN: No diaphoresis  rash HEENT: Unremarkable RESPIRATORY: Breathing is even, unlabored. Lung sounds are clear   CARDIOVASCULAR: Heart RRR no murmurs, rubs or gallops. No peripheral edema  GASTROINTESTINAL: Abdomen is soft, non-tender, not distended w/ normal bowel sounds.  GENITOURINARY: Bladder non tender, not distended  MUSCULOSKELETAL: No abnormal joints or musculature NEUROLOGIC: Cranial nerves 2-12 grossly intact. Moves all extremities PSYCHIATRIC: Mood and affect appropriate to situation, no behavioral issues  Patient Active Problem List   Diagnosis Date Noted  . Psychoses ([place]) 12/19/2017  . Insomnia 12/19/2017  . Urinary tract infection due to extended-spectrum beta lactamase (ESBL) producing Escherichia coli 11/17/2017  . Dysphagia, oropharyngeal 11/15/2017  . Altered mental status 11/10/2017  . Acute metabolic encephalopathy 16/08/9603  . Mania ([place]) 06/21/2017  . Hypomagnesemia 06/19/2017  .  Essential hypertension 06/18/2017  . Acute encephalopathy 06/17/2017  . Acute lower UTI 06/17/2017  . E. coli UTI 06/17/2017  . Depression 04/24/2017  . Hypothyroidism 10/03/2016  . Paranoia ([place]) 10/03/2016  . LOC (loss of consciousness) ([place]) 09/02/2016  . 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 Dementia with behavioral disturbance 09/02/2016  . Asthma with acute exacerbation 08/21/2016  . Atrial fibrillation with RVR ([place]) 08/21/2016  . Elevated INR 08/21/2016  . Chronic systolic heart failure ([place]) 08/21/2016  . Hyperlipidemia 08/21/2016  . Electrolyte abnormality 08/21/2016  . HCAP (healthcare-associated pneumonia) 08/10/2016  . Sepsis ([place]) 08/09/2016  . Coronary atherosclerosis of native coronary artery 01/21/2014  . Obesity 01/17/2014  . Abdominal pain, generalized 10/18/2012  . GERD (gastroesophageal reflux disease)   . Mood disorder ([place])   . Anxiety   . 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[place])   . Stroke ([place])   . Chest pain 01/07/2012  . Asthma  01/07/2012  . A-fib ([place]) 01/07/2012  . 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 Anemia 01/07/2012  . ANEMIA 07/29/2010  . BARRETTS ESOPHAGUS 06/04/2010    CMP     Component Value Date/Time   NA 140 04/29/2018 0938   NA 142 12/21/2017   K 4.7 04/29/2018 0938   CL 103 04/29/2018 0938   CO2 29 04/29/2018 0938   GLUCOSE 102 (H) 04/29/2018 0938   BUN 9 04/29/2018 0938   BUN 19 12/21/2017   CREATININE 1.00 04/29/2018 0938   CALCIUM 9.6 04/29/2018 0938   PROT 6.9 11/08/2017 1512   ALBUMIN 3.8 11/08/2017 1512   AST 18 11/08/2017 1512   ALT 20 11/08/2017 1512   ALKPHOS 64 11/08/2017 1512   BILITOT 0.9 11/08/2017 1512   GFRNONAA 53 (L) 04/29/2018 0938   GFRAA >60 04/29/2018 0938   Recent Labs    06/18/17 0346 06/19/17 0431 11/08/17 1512 11/09/17 0822 11/15/17 12/21/17 04/29/18 0938  NA 140 138 141 143 137 142 140  K 3.7 3.5 3.7 3.6 3.9 4.2 4.7  CL 107 107 105 112*  --   --  103  CO2 24 24 26 23   --   --  29  GLUCOSE 100* 102* 150* 109*  --   --  102*  BUN 9 10 20 16 11 19 9   CREATININE 0.94 0.94 0.94 0.86 0.8 1.0 1.00  CALCIUM 8.9 8.9 9.9 9.5  --   --  9.6  MG 1.7 1.6*  --   --   --   --   --   PHOS 3.4 3.0  --   --   --   --   --    Recent Labs    06/18/17 0346 06/19/17 0431 11/08/17 1512  AST 14* 16 18  ALT 10* 14 20  ALKPHOS 52 53 64  BILITOT 0.5 0.7 0.9  PROT 5.6* 5.7* 6.9  ALBUMIN 3.1* 3.1* 3.8   Recent Labs    06/19/17 0431 11/08/17 1512 11/09/17 0822 11/15/17 12/21/17 04/29/18 0938  WBC 6.5 7.0 6.4 9.3 5.0 6.6  NEUTROABS 4.2 5.4  --   --   --  5.0  HGB 12.2 13.8 12.8 13.6 11.7* 13.4  HCT 37.3 42.8 40.0 41 37 42.8  MCV 84.8 87.2 87.9  --   --  84.9  PLT 147* 233 209 193 162 150   Recent Labs    06/19/17 0433  CHOL 109  LDLCALC 37  TRIG 68   No results found for:  [facility] Lab Results  Component Value Date   TSH 1.680 11/08/2017   Lab Results  Component Value Date   HGBA1C 5.4 04/08/2017   Lab Results  Component Value  Date   CHOL 109 06/19/2017   HDL 58 06/19/2017   LDLCALC 37 06/19/2017   TRIG 68 06/19/2017   CHOLHDL 1.9 06/19/2017    Significant Diagnostic Results in last 30 days:  Ct Head Wo Contrast  Result Date: 04/29/2018 CLINICAL DATA:  S/p Fall, hit the left sided of her forehead. Denies LOC. Pt has hematoma to the left side of her forehead. Hx: Stroke, HTN, Dementia EXAM: CT HEAD WITHOUT CONTRAST CT CERVICAL SPINE WITHOUT CONTRAST TECHNIQUE: Multidetector CT imaging of the head and cervical spine was performed following the standard protocol without intravenous contrast. Multiplanar CT image reconstructions of the cervical spine were also generated. COMPARISON:  11/08/2017. FINDINGS: CT HEAD FINDINGS Brain: No evidence of acute infarction, hemorrhage, hydrocephalus, extra-axial collection or mass lesion/mass effect. Well-defined hypoattenuation with encephalomalacia in the right parietooccipital lobes, and hypoattenuation extending from the posterior, medial deep left frontal lobe white matter to the left posterosuperior frontal lobe and left parietal lobe, are stable consistent with old infarcts. There is ventricular and sulcal enlargement reflecting mild to moderate diffuse atrophy. Patchy white matter hypoattenuation is also noted consistent with chronic microvascular ischemic change. Vascular: No hyperdense vessel or unexpected calcification. Skull: Normal. Negative for fracture or focal lesion. Sinuses/Orbits: Globes and orbits are unremarkable. Sinuses are clear. Many of the right mastoid air cells show fluid attenuation, stable from the prior exam consistent with chronic effusion. Other: None. CT CERVICAL SPINE FINDINGS Alignment: Normal. Skull base and vertebrae: No acute fracture. No primary bone lesion or focal pathologic process. Soft tissues and spinal canal: No prevertebral fluid or swelling. No visible canal hematoma. Disc levels: Mild to moderate loss of disc height at C5-C6 and C6-C7 with mild  diffuse spondylotic disc bulging. There are facet degenerative changes bilaterally, greatest on the right at C3-C4. No convincing disc herniation. Upper chest: No acute findings. 1.7 cm right thyroid nodule. Carotid artery vascular calcifications, greater on the right. Peripheral interstitial scarring at the apices. Other: None. IMPRESSION: HEAD CT 1. No acute intracranial abnormalities. 2. Old infarcts, atrophy and chronic microvascular ischemic changes similar to the prior exam. CERVICAL CT 1. No fracture or acute finding. 2. Right thyroid nodule stable from a cervical CT dated 04/26/2016. Electronically Signed   By: Lajean Manes M.D.   On: 04/29/2018 10:49   Ct Cervical Spine Wo Contrast  Result Date: 04/29/2018 CLINICAL DATA:  S/p Fall, hit the left sided of her forehead. Denies LOC. Pt has hematoma to the left side of her forehead. Hx: Stroke, HTN, Dementia EXAM: CT HEAD WITHOUT CONTRAST CT CERVICAL SPINE WITHOUT CONTRAST TECHNIQUE: Multidetector CT imaging of the head and cervical spine was performed following the standard protocol without intravenous contrast. Multiplanar CT image reconstructions of the cervical spine were also generated. COMPARISON:  11/08/2017. FINDINGS: CT HEAD FINDINGS Brain: No evidence of acute infarction, hemorrhage, hydrocephalus, extra-axial collection or mass lesion/mass effect. Well-defined hypoattenuation with encephalomalacia in the right parietooccipital lobes, and hypoattenuation extending from the posterior, medial deep left frontal lobe white matter to the left posterosuperior frontal lobe and left parietal lobe, are stable consistent with old infarcts. There is ventricular and sulcal enlargement reflecting mild to moderate diffuse atrophy. Patchy white matter hypoattenuation is also noted consistent with chronic microvascular ischemic change. Vascular: No hyperdense vessel or unexpected calcification. Skull: Normal. Negative  for fracture or focal lesion. Sinuses/Orbits:  Globes and orbits are unremarkable. Sinuses are clear. Many of the right mastoid air cells show fluid attenuation, stable from the prior exam consistent with chronic effusion. Other: None. CT CERVICAL SPINE FINDINGS Alignment: Normal. Skull base and vertebrae: No acute fracture. No primary bone lesion or focal pathologic process. Soft tissues and spinal canal: No prevertebral fluid or swelling. No visible canal hematoma. Disc levels: Mild to moderate loss of disc height at C5-C6 and C6-C7 with mild diffuse spondylotic disc bulging. There are facet degenerative changes bilaterally, greatest on the right at C3-C4. No convincing disc herniation. Upper chest: No acute findings. 1.7 cm right thyroid nodule. Carotid artery vascular calcifications, greater on the right. Peripheral interstitial scarring at the apices. Other: None. IMPRESSION: HEAD CT 1. No acute intracranial abnormalities. 2. Old infarcts, atrophy and chronic microvascular ischemic changes similar to the prior exam. CERVICAL CT 1. No fracture or acute finding. 2. Right thyroid nodule stable from a cervical CT dated 04/26/2016. Electronically Signed   By: Lajean Manes M.D.   On: 04/29/2018 10:49   Ct Pelvis Wo Contrast  Result Date: 04/29/2018 CLINICAL DATA:  Bilateral hip pain after a fall today. Initial encounter. EXAM: CT PELVIS WITHOUT CONTRAST TECHNIQUE: Multidetector CT imaging of the pelvis was performed following the standard protocol without intravenous contrast. COMPARISON:  Pelvic radiograph 04/26/2016. CT abdomen and pelvis 05/29/2012. FINDINGS: Urinary Tract: No distal ureteral dilatation. Under distended bladder though with apparent moderate diffuse bladder wall thickening. Bowel: Mild sigmoid colon diverticulosis without evidence of diverticulitis. Partially visualized colonic dilatation near the splenic flexure at an anastomotic staple line, chronic based on the 2013 CT. Partially visualized supraumbilical hernia now containing a portion  of transverse colon without evidence of proximal bowel dilatation to indicate obstruction. Vascular/Lymphatic: Extensive aortoiliac atherosclerosis. No enlarged lymph nodes. Reproductive:  Status post hysterectomy.  No adnexal mass. Other:  No intraperitoneal free fluid. Musculoskeletal: No acute fracture identified. No hip dislocation. Moderate left greater than right hip osteoarthrosis with joint space narrowing, marginal osteophyte formation, and subchondral cyst formation. Degenerative changes of the pubic symphysis and SI joints. Focally advanced disc degeneration at L4-5. Advanced L5-S1 facet arthrosis with grade 1 anterolisthesis at this level as well as at L3-4. IMPRESSION: 1. Bilateral hip osteoarthrosis without evidence of acute osseous abnormality. 2. Supraumbilical hernia now containing a portion of transverse colon, incompletely imaged. 3. Diffuse bladder wall thickening, correlate for cystitis. 4.  Aortic Atherosclerosis (ICD10-I70.0). Electronically Signed   By: Logan Bores M.D.   On: 04/29/2018 13:51   Dg Knee Complete 4 Views Left  Result Date: 04/29/2018 CLINICAL DATA:  Pt fell this am trying to go to the bathroom. Pt is having left knee and hand pain and is also having pain in her forehead where she hit her head on the floor. EXAM: LEFT KNEE - COMPLETE 4+ VIEW COMPARISON:  None. FINDINGS: No fracture.  No bone lesion. There is lateral patellofemoral joint space compartment narrowing. Marginal osteophytes project from all 3 compartments. Small joint effusion. Posterior vascular calcifications. IMPRESSION: 1. No fracture, dislocation or acute finding. 2. Moderate arthropathic changes. 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 3. Small joint effusion. Electronically Signed   By: Lajean Manes M.D.   On: 04/29/2018 10:06   Dg Knee Complete 4 Views Right  Result Date: 04/29/2018 CLINICAL DATA:  76 year old female  with right knee pain and swelling after falling earlier today EXAM: RIGHT KNEE -  COMPLETE 4+ VIEW COMPARISON:  None. FINDINGS: Advanced tricompartmental degenerative osteoarthritis most severe in the patellofemoral and lateral compartments were there is significant joint space narrowing and osteophyte production. No definite acute fracture or malalignment. Small suprapatellar knee joint effusion may be degenerative. Extensive atherosclerotic vascular calcifications are present in the distal superficial femoral artery and throughout the popliteal artery. No focal soft tissue abnormality. IMPRESSION: 1. No acute fracture or malalignment. 2. 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 3. Small suprapatellar knee joint effusion. 4. Femoropopliteal atherosclerotic vascular calcifications. Electronically Signed   By: Jacqulynn Cadet M.D.   On: 04/29/2018 10:55   Dg Hand Complete Left  Result Date: 04/29/2018 CLINICAL DATA:  Left hand pain due to an injury sustained in a fall in the bathroom this morning. Initial encounter. EXAM: LEFT HAND - COMPLETE 3+ VIEW COMPARISON:  None. FINDINGS: No acute bony or joint abnormality is identified. The patient has moderately severe first CMC osteoarthritis. Soft tissues are unremarkable. IMPRESSION: No acute abnormality. 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 Electronically Signed   By: Inge Rise M.D.   On: 04/29/2018 10:04    Assessment and Plan  Psychoses [facility]) Patient has been doing extremely well, has been very pleasant; continue Seroquel 100 mg every morning and 75 mg nightly  Depression Patient continues to do well; continue trazodone 150 mg nightly and Zoloft 50 mg nightly     Yatzil Clippinger D. [name], M.D.

## 2018-04-22 ENCOUNTER — Encounter: Payer: Self-pay | Admitting: Internal Medicine

## 2018-04-22 NOTE — Progress Notes (Signed)
Location:   [facility] of Service:   [place]  [name], MD  Patient Care Team: [name], MD as PCP - General (Internal Medicine)  Extended Emergency Contact Information Primary Emergency Contact: Tyner,[name] Address: [address] [place], [place] [address] [name] Work Phone: [phone] Mobile Phone: [phone] Relation: Daughter Secondary Emergency Contact: Janalynn, Eder Mobile Phone: [phone] Relation: Son    Allergies: Patient has no known allergies.  Chief Complaint  Patient presents with  . Acute Visit    HPI: Patient is 77 y.o. female who is being seen acutely for a potential UTI.  Patient has had a behavior change and has had more hallucinations and behaviors that we have seen prior to her having to be admitted to psych facility.  We got a urine from her to make sure she did not have a UTI because every time we sent her to the ED with behavior change then he says she has a urinary tract infection they treated and they sent her back, whereupon she continues with her behaviors and eventually has to go to a psychiatric unit.  Today patient's urine returned with greater than 60,000 E. coli.  Patient is not really communicating well with [place] so unable to get any symptoms from her.  Past Medical History:  Diagnosis Date  . A-fib ([place]) 01/07/2012  . ANEMIA 07/29/2010   Qualifier: Diagnosis of  By: Bobby Rumpf CMA (AAMA), [place]    . Angina pectoris ([place])   . Anxiety   . Asthma   . Asthma 01/07/2012  . Barrett's esophagus   . Bronchitis   . Cancer ([place])    uterian  . Chest pain   . Coronary artery disease    right carotid occlusion   . Dementia with behavioral disturbance 09/02/2016  . Depression   . Dysrhythmia    atrial fibrillation  . GERD (gastroesophageal reflux disease)   . 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 Hyperlipidemia   . Hypertension   . Mood disorder ([place])   . Paranoia ([place])  10/03/2016  . Recurrent upper respiratory infection (URI)   . Stroke [facility])    2007, after left endarterectomy    Past Surgical History:  Procedure Laterality Date  . ABDOMINAL HYSTERECTOMY    . BALLOON DILATION  10/18/2012   Procedure: BALLOON DILATION;  Surgeon: Lear Ng, MD;  [facility];  Service: Endoscopy;  Laterality: N/A;  . CARDIAC CATHETERIZATION    . CAROTID ENDARTERECTOMY     left, complicated by stroke  . CHOLECYSTECTOMY     incidental at time of colectomy  . COLONOSCOPY  12/01/2011   Procedure: COLONOSCOPY;  Surgeon: Lear Ng, MD;  [facility];  Service: Endoscopy;  Laterality: N/A;  . COLONOSCOPY  10/18/2012   Procedure: COLONOSCOPY;  Surgeon: Lear Ng, MD;  [facility];  Service: Endoscopy;  Laterality: N/A;  colonic dilation  . ESOPHAGOGASTRODUODENOSCOPY  12/01/2011   Procedure: ESOPHAGOGASTRODUODENOSCOPY (EGD);  Surgeon: Lear Ng, MD;  [facility];  Service: Endoscopy;  Laterality: N/A;  . HOT HEMOSTASIS  12/01/2011   Procedure: HOT HEMOSTASIS (ARGON PLASMA COAGULATION/BICAP);  Surgeon: Lear Ng, MD;  Location: [facility];  Service: Endoscopy;  Laterality: N/A;  . 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 Always use your most recent med list.          alendronate 70 MG tablet Commonly known as:  FOSAMAX Take 70 mg by mouth every Wednesday. Take with a full glass of water on an empty stomach.   budesonide-formoterol 160-4.5 MCG/ACT inhaler Commonly known as:  SYMBICORT Inhale 2 puffs into the lungs 2 (two) times daily.   carvedilol 3.125 MG tablet Commonly known as:  COREG Take 1 tablet (3.125 mg total) by mouth 2 (two) times daily with a meal.   fluticasone 50 MCG/ACT nasal spray Commonly known as:   FLONASE Place 2 sprays into both nostrils daily.   isosorbide mononitrate 30 MG 24 hr tablet Commonly known as:  IMDUR Take 30 mg by mouth daily.   levothyroxine 25 MCG tablet Commonly known as:  SYNTHROID, LEVOTHROID Take 25 mcg by mouth daily before breakfast.   mirtazapine 15 MG tablet Commonly known as:  REMERON Take 7.5 mg by mouth at bedtime. 1/2 TAB   QUEtiapine 100 MG tablet Commonly known as:  SEROQUEL Take 100 mg by mouth every morning.   QUEtiapine 50 MG tablet Commonly known as:  SEROQUEL Take 50 mg by mouth at bedtime.   rivaroxaban 10 MG Tabs tablet Commonly known as:  XARELTO Take 15 mg by mouth daily. 1 and 1/2 tablet   sertraline 50 MG tablet Commonly known as:  ZOLOFT Take 50 mg by mouth daily.   simvastatin 10 MG tablet Commonly known as:  ZOCOR Take 10 mg by mouth at bedtime.   tiotropium 18 MCG inhalation capsule Commonly known as:  SPIRIVA Place 18 mcg into inhaler and inhale daily.   traZODone 150 MG tablet Commonly known as:  DESYREL Take 150 mg by mouth at bedtime.       No orders of the defined types were placed in this encounter.   Immunization History  Administered Date(s) Administered  . Influenza-Unspecified 08/26/2016, 10/21/2017  . PPD Test 08/16/2016  . Pneumococcal Polysaccharide-23 01/08/2012    Social History   Tobacco Use  . Smoking status: Former Smoker    Last attempt to quit: 04/09/2003    Years since quitting: 15.0  . Smokeless tobacco: Never Used  Substance Use Topics  . Alcohol use: No    Review of Systems  DATA OBTAINED: from nurse- concerns are obvious, patient with more hallucinations and with behaviors seen prior that lead to her admission to a psychiatric unit GENERAL:  no fevers, fatigue, appetite changes SKIN: No itching, rash HEENT: No complaint RESPIRATORY: No cough, wheezing, SOB CARDIAC: No chest pain, palpitations, lower extremity edema  GI: No abdominal pain, No N/V/D or constipation, No  heartburn or reflux  GU: No dysuria, frequency or urgency, or incontinence  MUSCULOSKELETAL: No unrelieved bone/joint pain NEUROLOGIC: No headache, dizziness  PSYCHIATRIC: + Behaviors  Vitals:   04/22/18 2142  BP: 112/63  Pulse: 67  Resp: 20  Temp: 98.5 F (36.9 C)   There is no height or weight on file to calculate BMI. Physical Exam  GENERAL APPEARANCE: Alert, conversant, No acute distress  SKIN: No diaphoresis rash HEENT: Unremarkable RESPIRATORY: Breathing is even, unlabored  CARDIOVASCULAR:  No peripheral edema  GASTROINTESTINAL:  not distended w/ normal bowel sounds.  GENITOURINARY:  not distended  MUSCULOSKELETAL: No abnormal joints or musculature NEUROLOGIC: Cranial nerves 2-12 grossly intact. Moves all extremities PSYCHIATRIC: Patient is paranoid and difficult  to deal with; refuses exam  Patient Active Problem List   Diagnosis Date Noted  . 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 Insomnia 12/19/2017  . Urinary tract infection due to extended-spectrum beta lactamase (ESBL) producing Escherichia coli 11/17/2017  . Dysphagia, oropharyngeal 11/15/2017  . Altered mental status 11/10/2017  . Acute metabolic encephalopathy 16/08/9603  . Mania ([place]) 06/21/2017  . Hypomagnesemia 06/19/2017  . Essential hypertension 06/18/2017  . Acute encephalopathy 06/17/2017  . Acute lower UTI 06/17/2017  . E. coli UTI 06/17/2017  . Depression 04/24/2017  . Hypothyroidism 10/03/2016  . Paranoia ([place]) 10/03/2016  . LOC (loss of consciousness) ([place]) 09/02/2016  . 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 Dementia with behavioral disturbance 09/02/2016  . Asthma with acute exacerbation 08/21/2016  . Atrial fibrillation with RVR ([place]) 08/21/2016  . Elevated INR 08/21/2016  . Chronic systolic heart failure ([place]) 08/21/2016  . Hyperlipidemia 08/21/2016  . Electrolyte abnormality 08/21/2016  . HCAP (healthcare-associated pneumonia) 08/10/2016  . Sepsis ([place]) 08/09/2016  . Coronary  atherosclerosis of native coronary artery 01/21/2014  . Obesity 01/17/2014  . Abdominal pain, generalized 10/18/2012  . GERD (gastroesophageal reflux disease)   . Mood disorder ([place])   . Anxiety   . 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[place])   . Stroke ([place])   . Chest pain 01/07/2012  . Asthma 01/07/2012  . A-fib ([place]) 01/07/2012  . 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 Anemia 01/07/2012  . ANEMIA 07/29/2010  . BARRETTS ESOPHAGUS 06/04/2010    CMP     Component Value Date/Time   NA 142 12/21/2017   K 4.2 12/21/2017   CL 112 (H) 11/09/2017 0822   CO2 23 11/09/2017 0822   GLUCOSE 109 (H) 11/09/2017 0822   BUN 19 12/21/2017   CREATININE 1.0 12/21/2017   CREATININE 0.86 11/09/2017 0822   CALCIUM 9.5 11/09/2017 0822   PROT 6.9 11/08/2017 1512   ALBUMIN 3.8 11/08/2017 1512   AST 18 11/08/2017 1512   ALT 20 11/08/2017 1512   ALKPHOS 64 11/08/2017 1512   BILITOT 0.9 11/08/2017 1512   GFRNONAA >60 11/09/2017 0822   GFRAA >60 11/09/2017 0822   Recent Labs    06/18/17 0346 06/19/17 0431 11/08/17 1512 11/09/17 0822 11/15/17 12/21/17  NA 140 138 141 143 137 142  K 3.7 3.5 3.7 3.6 3.9 4.2  CL 107 107 105 112*  --   --   CO2 24 24 26 23   --   --   GLUCOSE 100* 102* 150* 109*  --   --   BUN 9 10 20 16 11 19   CREATININE 0.94 0.94 0.94 0.86 0.8 1.0  CALCIUM 8.9 8.9 9.9 9.5  --   --   MG 1.7 1.6*  --   --   --   --   PHOS 3.4 3.0  --   --   --   --    Recent Labs    06/18/17 0346 06/19/17 0431 11/08/17 1512  AST 14* 16 18  ALT 10* 14 20  ALKPHOS 52 53 64  BILITOT 0.5 0.7 0.9  PROT 5.6* 5.7* 6.9  ALBUMIN 3.1* 3.1* 3.8   Recent Labs    06/18/17 0346 06/19/17 0431 11/08/17 1512 11/09/17 0822 11/15/17 12/21/17  WBC 5.6 6.5 7.0 6.4 9.3 5.0  NEUTROABS 3.6 4.2 5.4  --   --   --   HGB 11.8* 12.2 13.8 12.8 13.6 11.7*  HCT 36.0 37.3 42.8 40.0 41 37  MCV 84.7 84.8 87.2 87.9  --   --   PLT 153 147* 233 209 193 162   Recent Labs      06/19/17 0433  CHOL 109  LDLCALC 37  TRIG 68   No results found for: [facility] Lab Results  Component Value Date   TSH 1.680 11/08/2017   Lab Results  Component Value Date   HGBA1C 5.4 04/08/2017   Lab Results  Component Value Date   CHOL 109 06/19/2017   HDL 58 06/19/2017   LDLCALC 37 06/19/2017   TRIG 68 06/19/2017   CHOLHDL 1.9 06/19/2017    Significant Diagnostic Results in last 30 days:  No results found.  Assessment and Plan  Bacteriuria/behaviors- patient's urine grew out greater than 60,000 E. coli pansensitive; we are planning to send patient to the ED tomorrow; since this could be construed as a UTI we will like for it to be already being treated before she is seen; therefore have ordered Rocephin 1 g daily for 3 days    Inocencio Homes, MD

## 2018-04-22 NOTE — Progress Notes (Signed)
Location:      Place of Service:     [name], MD  Patient Care Team: [name], MD as PCP - General (Internal Medicine)  Extended Emergency Contact Information Primary Emergency Contact: Tyner,[name] Address: [address] [place], [place] [address] [name] Work Phone: [phone] Mobile Phone: [phone] Relation: Daughter Secondary Emergency Contact: Florestine, Carmical Mobile Phone: [phone] Relation: Son    Allergies: Patient has no known allergies.  No chief complaint on file.   HPI: Patient is 77 y.o. female who   Past Medical History:  Diagnosis Date  . A-fib ([place]) 01/07/2012  . ANEMIA 07/29/2010   Qualifier: Diagnosis of  By: Bobby Rumpf CMA (AAMA), [place]ty    . Angina pectoris ([place])   . Anxiety   . Asthma   . Asthma 01/07/2012  . Barrett's esophagus   . Bronchitis   . 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 Chest pain   . Coronary artery disease    right carotid occlusion   . Dementia with behavioral disturbance 09/02/2016  . Depression   . Dysrhythmia    atrial fibrillation  . GERD (gastroesophageal reflux disease)   . 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 Hyperlipidemia   . Hypertension   . Mood disorder ([place])   . Paranoia ([place]) 10/03/2016  . Recurrent upper respiratory infection (URI)   . Stroke [facility])    2007, after left endarterectomy    Past Surgical History:  Procedure Laterality Date  . ABDOMINAL HYSTERECTOMY    . BALLOON DILATION  10/18/2012   Procedure: BALLOON DILATION;  Surgeon: Lear Ng, MD;  [facility];  Service: Endoscopy;  Laterality: N/A;  . CARDIAC CATHETERIZATION    . CAROTID ENDARTERECTOMY     left, complicated by stroke  . CHOLECYSTECTOMY     incidental at time of colectomy  . COLONOSCOPY  12/01/2011   Procedure: COLONOSCOPY;  Surgeon: Lear Ng, MD;  [facility];  Service: Endoscopy;  Laterality: N/A;  . COLONOSCOPY  10/18/2012   Procedure:  COLONOSCOPY;  Surgeon: Lear Ng, MD;  [facility];  Service: Endoscopy;  Laterality: N/A;  colonic dilation  . ESOPHAGOGASTRODUODENOSCOPY  12/01/2011   Procedure: ESOPHAGOGASTRODUODENOSCOPY (EGD);  Surgeon: Lear Ng, MD;  [facility];  Service: Endoscopy;  Laterality: N/A;  . HOT HEMOSTASIS  12/01/2011   Procedure: HOT HEMOSTASIS (ARGON PLASMA COAGULATION/BICAP);  Surgeon: Lear Ng, MD;  [facility];  Service: Endoscopy;  Laterality: N/A;  . 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 Always use your most recent med list.          alendronate 70 MG tablet Commonly known as:  FOSAMAX Take 70 mg by mouth every Wednesday. Take with a full glass of water on an empty stomach.   budesonide-formoterol 160-4.5 MCG/ACT inhaler Commonly known as:  SYMBICORT Inhale 2 puffs into the lungs 2 (two) times daily.   carvedilol 3.125 MG tablet Commonly known as:  COREG Take 1 tablet (3.125 mg total) by mouth 2 (two) times daily with a meal.   fluticasone 50 MCG/ACT nasal spray Commonly known as:  FLONASE Place 2 sprays into both nostrils daily.  isosorbide mononitrate 30 MG 24 hr tablet Commonly known as:  IMDUR Take 30 mg by mouth daily.   levothyroxine 25 MCG tablet Commonly known as:  SYNTHROID, LEVOTHROID Take 25 mcg by mouth daily before breakfast.   mirtazapine 15 MG tablet Commonly known as:  REMERON Take 7.5 mg by mouth at bedtime. 1/2 TAB   QUEtiapine 100 MG tablet Commonly known as:  SEROQUEL Take 100 mg by mouth every morning.   QUEtiapine 50 MG tablet Commonly known as:  SEROQUEL Take 50 mg by mouth at bedtime.   rivaroxaban 10 MG Tabs tablet Commonly known as:  XARELTO Take 15 mg by mouth daily. 1 and 1/2 tablet   sertraline 50 MG tablet Commonly known  as:  ZOLOFT Take 50 mg by mouth daily.   simvastatin 10 MG tablet Commonly known as:  ZOCOR Take 10 mg by mouth at bedtime.   tiotropium 18 MCG inhalation capsule Commonly known as:  SPIRIVA Place 18 mcg into inhaler and inhale daily.   traZODone 150 MG tablet Commonly known as:  DESYREL Take 150 mg by mouth at bedtime.       No orders of the defined types were placed in this encounter.   Immunization History  Administered Date(s) Administered  . Influenza-Unspecified 08/26/2016, 10/21/2017  . PPD Test 08/16/2016  . Pneumococcal Polysaccharide-23 01/08/2012    Social History   Tobacco Use  . Smoking status: Former Smoker    Last attempt to quit: 04/09/2003    Years since quitting: 15.0  . Smokeless tobacco: Never Used  Substance Use Topics  . Alcohol use: No    Review of Systems  DATA OBTAINED: from patient, nurse, medical record, family member GENERAL:  no fevers, fatigue, appetite changes SKIN: No itching, rash HEENT: No complaint RESPIRATORY: No cough, wheezing, SOB CARDIAC: No chest pain, palpitations, lower extremity edema  GI: No abdominal pain, No N/V/D or constipation, No heartburn or reflux  GU: No dysuria, frequency or urgency, or incontinence  MUSCULOSKELETAL: No unrelieved bone/joint pain NEUROLOGIC: No headache, dizziness  PSYCHIATRIC: No overt anxiety or sadness  There were no vitals filed for this visit. There is no height or weight on file to calculate BMI. Physical Exam  GENERAL APPEARANCE: Alert, conversant, No acute distress  SKIN: No diaphoresis rash HEENT: Unremarkable RESPIRATORY: Breathing is even, unlabored. Lung sounds are clear   CARDIOVASCULAR: Heart RRR no murmurs, rubs or gallops. No peripheral edema  GASTROINTESTINAL: Abdomen is soft, non-tender, not distended w/ normal bowel sounds.  GENITOURINARY: Bladder non tender, not distended  MUSCULOSKELETAL: No abnormal joints or musculature NEUROLOGIC: Cranial nerves 2-12 grossly  intact. Moves all extremities PSYCHIATRIC: Mood and affect appropriate to situation, no behavioral issues  Patient Active Problem List   Diagnosis Date Noted  . 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 Insomnia 12/19/2017  . Urinary tract infection due to extended-spectrum beta lactamase (ESBL) producing Escherichia coli 11/17/2017  . Dysphagia, oropharyngeal 11/15/2017  . Altered mental status 11/10/2017  . Acute metabolic encephalopathy 33/35/4562  . Mania ([place]) 06/21/2017  . Hypomagnesemia 06/19/2017  . Essential hypertension 06/18/2017  . Acute encephalopathy 06/17/2017  . Acute lower UTI 06/17/2017  . E. coli UTI 06/17/2017  . Depression 04/24/2017  . Hypothyroidism 10/03/2016  . Paranoia ([place]) 10/03/2016  . LOC (loss of consciousness) ([place]) 09/02/2016  . 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 Dementia with behavioral disturbance 09/02/2016  . Asthma with acute exacerbation 08/21/2016  . Atrial fibrillation with RVR ([place]) 08/21/2016  . Elevated INR 08/21/2016  .  Chronic systolic heart failure ([place]) 08/21/2016  . Hyperlipidemia 08/21/2016  . Electrolyte abnormality 08/21/2016  . HCAP (healthcare-associated pneumonia) 08/10/2016  . Sepsis ([place]) 08/09/2016  . Coronary atherosclerosis of native coronary artery 01/21/2014  . Obesity 01/17/2014  . Abdominal pain, generalized 10/18/2012  . GERD (gastroesophageal reflux disease)   . Mood disorder ([place])   . Anxiety   . 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[place])   . Stroke ([place])   . Chest pain 01/07/2012  . Asthma 01/07/2012  . A-fib ([place]) 01/07/2012  . 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 Anemia 01/07/2012  . ANEMIA 07/29/2010  . BARRETTS ESOPHAGUS 06/04/2010    CMP     Component Value Date/Time   NA 142 12/21/2017   K 4.2 12/21/2017   CL 112 (H) 11/09/2017 0822   CO2 23 11/09/2017 0822   GLUCOSE 109 (H) 11/09/2017 0822   BUN 19 12/21/2017   CREATININE 1.0 12/21/2017    CREATININE 0.86 11/09/2017 0822   CALCIUM 9.5 11/09/2017 0822   PROT 6.9 11/08/2017 1512   ALBUMIN 3.8 11/08/2017 1512   AST 18 11/08/2017 1512   ALT 20 11/08/2017 1512   ALKPHOS 64 11/08/2017 1512   BILITOT 0.9 11/08/2017 1512   GFRNONAA >60 11/09/2017 0822   GFRAA >60 11/09/2017 0822   Recent Labs    06/18/17 0346 06/19/17 0431 11/08/17 1512 11/09/17 0822 11/15/17 12/21/17  NA 140 138 141 143 137 142  K 3.7 3.5 3.7 3.6 3.9 4.2  CL 107 107 105 112*  --   --   CO2 24 24 26 23   --   --   GLUCOSE 100* 102* 150* 109*  --   --   BUN 9 10 20 16 11 19   CREATININE 0.94 0.94 0.94 0.86 0.8 1.0  CALCIUM 8.9 8.9 9.9 9.5  --   --   MG 1.7 1.6*  --   --   --   --   PHOS 3.4 3.0  --   --   --   --    Recent Labs    06/18/17 0346 06/19/17 0431 11/08/17 1512  AST 14* 16 18  ALT 10* 14 20  ALKPHOS 52 53 64  BILITOT 0.5 0.7 0.9  PROT 5.6* 5.7* 6.9  ALBUMIN 3.1* 3.1* 3.8   Recent Labs    06/18/17 0346 06/19/17 0431 11/08/17 1512 11/09/17 0822 11/15/17 12/21/17  WBC 5.6 6.5 7.0 6.4 9.3 5.0  NEUTROABS 3.6 4.2 5.4  --   --   --   HGB 11.8* 12.2 13.8 12.8 13.6 11.7*  HCT 36.0 37.3 42.8 40.0 41 37  MCV 84.7 84.8 87.2 87.9  --   --   PLT 153 147* 233 209 193 162   Recent Labs    06/19/17 0433  CHOL 109  LDLCALC 37  TRIG 68   No results found for: [facility] Lab Results  Component Value Date   TSH 1.680 11/08/2017   Lab Results  Component Value Date   HGBA1C 5.4 04/08/2017   Lab Results  Component Value Date   CHOL 109 06/19/2017   HDL 58 06/19/2017   LDLCALC 37 06/19/2017   TRIG 68 06/19/2017   CHOLHDL 1.9 06/19/2017    Significant Diagnostic Results in last 30 days:  No results found.  Assessment and Plan  No problem-specific Assessment & Plan notes found for this encounter.   Labs/tests ordered:    Inocencio Homes, MD   This encounter was  created in error - please disregard.

## 2018-04-22 NOTE — Progress Notes (Signed)
Location:  [place] of Service:  SNF (31)  [name], MD  Patient Care Team: [name], MD as PCP - General (Internal Medicine)  Extended Emergency Contact Information Primary Emergency Contact: Tyner,[name] Address: [address] [place], [place] [address] [name] Work Phone: [phone] Mobile Phone: [phone] Relation: Daughter Secondary Emergency Contact: Marykatherine, [place] Mobile Phone: [phone] Relation: Son    Allergies: Patient has no known allergies.  Chief Complaint  Patient presents with  . Acute Visit    HPI: Patient is 77 y.o. female who I am seeing acutely for reflux.  Speech therapy have been working with her and noted that she is refluxing.  Patient herself can sometimes give history, sometimes not; today she she may be throwing up.  Past Medical History:  Diagnosis Date  . A-fib ([place]) 01/07/2012  . ANEMIA 07/29/2010   Qualifier: Diagnosis of  By: Bobby Rumpf CMA (AAMA), [place]    . Angina pectoris ([place])   . Anxiety   . Asthma   . Asthma 01/07/2012  . Barrett's esophagus   . Bronchitis   . 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 Chest pain   . Coronary artery disease    right carotid occlusion   . Dementia with behavioral disturbance 09/02/2016  . Depression   . Dysrhythmia    atrial fibrillation  . GERD (gastroesophageal reflux disease)   . 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 Hyperlipidemia   . Hypertension   . Mood disorder ([place])   . Paranoia ([place]) 10/03/2016  . Recurrent upper respiratory infection (URI)   . Stroke [facility])    2007, after left endarterectomy    Past Surgical History:  Procedure Laterality Date  . ABDOMINAL HYSTERECTOMY    . BALLOON DILATION  10/18/2012   Procedure: BALLOON DILATION;  Surgeon: Lear Ng, MD;  Location: [facility] ENDOSCOPY;  Service: Endoscopy;  Laterality: N/A;  . CARDIAC CATHETERIZATION    . CAROTID ENDARTERECTOMY     left, complicated by stroke   . CHOLECYSTECTOMY     incidental at time of colectomy  . COLONOSCOPY  12/01/2011   Procedure: COLONOSCOPY;  Surgeon: Lear Ng, MD;  Location: [facility] ENDOS[facility];  Service: Endoscopy;  Laterality: N/A;  . COLONOSCOPY  10/18/2012   Procedure: COLONOSCOPY;  Surgeon: Lear Ng, MD;  Location: [facility];  Service: Endoscopy;  Laterality: N/A;  colonic dilation  . ESOPHAGOGASTRODUODENOSCOPY  12/01/2011   Procedure: ESOPHAGOGASTRODUODENOSCOPY (EGD);  Surgeon: Lear Ng, MD;  Location: [facility];  Service: Endoscopy;  Laterality: N/A;  . HOT HEMOSTASIS  12/01/2011   Procedure: HOT HEMOSTASIS (ARGON PLASMA COAGULATION/BICAP);  Surgeon: Lear Ng, MD;  Location: [facility];  Service: Endoscopy;  Laterality: N/A;  . 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 Always use your most recent med list.          alendronate 70 MG tablet Commonly known as:  FOSAMAX Take 70 mg by mouth every Wednesday. Take with a full glass of water on an empty stomach.   budesonide-formoterol 160-4.5 MCG/ACT inhaler Commonly known as:  SYMBICORT Inhale 2 puffs into the lungs 2 (two) times daily.   carvedilol  3.125 MG tablet Commonly known as:  COREG Take 1 tablet (3.125 mg total) by mouth 2 (two) times daily with a meal.   fluticasone 50 MCG/ACT nasal spray Commonly known as:  FLONASE Place 2 sprays into both nostrils daily.   isosorbide mononitrate 30 MG 24 hr tablet Commonly known as:  IMDUR Take 30 mg by mouth daily.   levothyroxine 25 MCG tablet Commonly known as:  SYNTHROID, LEVOTHROID Take 25 mcg by mouth daily before breakfast.   mirtazapine 15 MG tablet Commonly known as:  REMERON Take 7.5 mg by mouth at bedtime. 1/2 TAB   QUEtiapine 100 MG tablet Commonly known as:  SEROQUEL Take 100 mg by mouth every  morning.   QUEtiapine 50 MG tablet Commonly known as:  SEROQUEL Take 50 mg by mouth at bedtime.   rivaroxaban 10 MG Tabs tablet Commonly known as:  XARELTO Take 15 mg by mouth daily. 1 and 1/2 tablet   sertraline 50 MG tablet Commonly known as:  ZOLOFT Take 50 mg by mouth daily.   simvastatin 10 MG tablet Commonly known as:  ZOCOR Take 10 mg by mouth at bedtime.   tiotropium 18 MCG inhalation capsule Commonly known as:  SPIRIVA Place 18 mcg into inhaler and inhale daily.   traZODone 150 MG tablet Commonly known as:  DESYREL Take 150 mg by mouth at bedtime.       No orders of the defined types were placed in this encounter.   Immunization History  Administered Date(s) Administered  . Influenza-Unspecified 08/26/2016, 10/21/2017  . PPD Test 08/16/2016  . Pneumococcal Polysaccharide-23 01/08/2012    Social History   Tobacco Use  . Smoking status: Former Smoker    Last attempt to quit: 04/09/2003    Years since quitting: 15.0  . Smokeless tobacco: Never Used  Substance Use Topics  . Alcohol use: No    Review of Systems  DATA OBTAINED: from patient-; nursing-no acute concerns; speech therapy-concerns for reflux GENERAL:  no fevers, fatigue, appetite changes SKIN: No itching, rash HEENT: No complaint RESPIRATORY: No cough, wheezing, SOB CARDIAC: No chest pain, palpitations, lower extremity edema  GI: No abdominal pain, No N/V/D or constipation, No heartburn, +reflux  GU: No dysuria, frequency or urgency, or incontinence  MUSCULOSKELETAL: No unrelieved bone/joint pain NEUROLOGIC: No headache, dizziness  PSYCHIATRIC: No overt anxiety or sadness  Vitals:   04/22/18 1350  BP: (!) 145/79  Pulse: 65  Resp: 18  Temp: 97.6 F (36.4 C)   There is no height or weight on file to calculate BMI. Physical Exam  GENERAL APPEARANCE: Alert, conversant, No acute distress  SKIN: No diaphoresis rash HEENT: Unremarkable RESPIRATORY: Breathing is even, unlabored. Lung  sounds are clear   CARDIOVASCULAR: Heart RRR no murmurs, rubs or gallops. No peripheral edema  GASTROINTESTINAL: Abdomen is soft, non-tender, not distended w/ normal bowel sounds.  GENITOURINARY: Bladder non tender, not distended  MUSCULOSKELETAL: No abnormal joints or musculature NEUROLOGIC: Cranial nerves 2-12 grossly intact. Moves all extremities PSYCHIATRIC: Mood and affect appropriate to situation, no behavioral issues  Patient Active Problem List   Diagnosis Date Noted  . Psychoses ([place]) 12/19/2017  . Insomnia 12/19/2017  . Urinary tract infection due to extended-spectrum beta lactamase (ESBL) producing Escherichia coli 11/17/2017  . Dysphagia, oropharyngeal 11/15/2017  . Altered mental status 11/10/2017  . Acute metabolic encephalopathy 46/27/0350  . Mania ([place]) 06/21/2017  . Hypomagnesemia 06/19/2017  . Essential hypertension 06/18/2017  . Acute encephalopathy 06/17/2017  . Acute lower UTI 06/17/2017  .  E. coli UTI 06/17/2017  . Depression 04/24/2017  . Hypothyroidism 10/03/2016  . Paranoia ([place]) 10/03/2016  . LOC (loss of consciousness) ([place]) 09/02/2016  . 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 Dementia with behavioral disturbance 09/02/2016  . Asthma with acute exacerbation 08/21/2016  . Atrial fibrillation with RVR ([place]) 08/21/2016  . Elevated INR 08/21/2016  . Chronic systolic heart failure ([place]) 08/21/2016  . Hyperlipidemia 08/21/2016  . Electrolyte abnormality 08/21/2016  . HCAP (healthcare-associated pneumonia) 08/10/2016  . Sepsis ([place]) 08/09/2016  . Coronary atherosclerosis of native coronary artery 01/21/2014  . Obesity 01/17/2014  . Abdominal pain, generalized 10/18/2012  . GERD (gastroesophageal reflux disease)   . Mood disorder ([place])   . Anxiety   . 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 Stroke ([place])   . Chest pain 01/07/2012  . Asthma 01/07/2012  . A-fib ([place]) 01/07/2012  . History of CVA with residual  deficit 01/07/2012  . Anemia 01/07/2012  . ANEMIA 07/29/2010  . BARRETTS ESOPHAGUS 06/04/2010    CMP     Component Value Date/Time   NA 142 12/21/2017   K 4.2 12/21/2017   CL 112 (H) 11/09/2017 0822   CO2 23 11/09/2017 0822   GLUCOSE 109 (H) 11/09/2017 0822   BUN 19 12/21/2017   CREATININE 1.0 12/21/2017   CREATININE 0.86 11/09/2017 0822   CALCIUM 9.5 11/09/2017 0822   PROT 6.9 11/08/2017 1512   ALBUMIN 3.8 11/08/2017 1512   AST 18 11/08/2017 1512   ALT 20 11/08/2017 1512   ALKPHOS 64 11/08/2017 1512   BILITOT 0.9 11/08/2017 1512   GFRNONAA >60 11/09/2017 0822   GFRAA >60 11/09/2017 0822   Recent Labs    06/18/17 0346 06/19/17 0431 11/08/17 1512 11/09/17 0822 11/15/17 12/21/17  NA 140 138 141 143 137 142  K 3.7 3.5 3.7 3.6 3.9 4.2  CL 107 107 105 112*  --   --   CO2 24 24 26 23   --   --   GLUCOSE 100* 102* 150* 109*  --   --   BUN 9 10 20 16 11 19   CREATININE 0.94 0.94 0.94 0.86 0.8 1.0  CALCIUM 8.9 8.9 9.9 9.5  --   --   MG 1.7 1.6*  --   --   --   --   PHOS 3.4 3.0  --   --   --   --    Recent Labs    06/18/17 0346 06/19/17 0431 11/08/17 1512  AST 14* 16 18  ALT 10* 14 20  ALKPHOS 52 53 64  BILITOT 0.5 0.7 0.9  PROT 5.6* 5.7* 6.9  ALBUMIN 3.1* 3.1* 3.8   Recent Labs    06/18/17 0346 06/19/17 0431 11/08/17 1512 11/09/17 0822 11/15/17 12/21/17  WBC 5.6 6.5 7.0 6.4 9.3 5.0  NEUTROABS 3.6 4.2 5.4  --   --   --   HGB 11.8* 12.2 13.8 12.8 13.6 11.7*  HCT 36.0 37.3 42.8 40.0 41 37  MCV 84.7 84.8 87.2 87.9  --   --   PLT 153 147* 233 209 193 162   Recent Labs    06/19/17 0433  CHOL 109  LDLCALC 37  TRIG 68   No results found for: [facility] Lab Results  Component Value Date   TSH 1.680 11/08/2017   Lab Results  Component Value Date   HGBA1C 5.4 04/08/2017   Lab Results  Component Value Date   CHOL 109 06/19/2017  HDL 58 06/19/2017   LDLCALC 37 06/19/2017   TRIG 68 06/19/2017   CHOLHDL 1.9 06/19/2017    Significant Diagnostic  Results in last 30 days:  No results found.  Assessment and Plan  GERD- we will start patient on Protonix 40 mg p.o. daily; monitor response     Inocencio Homes, MD

## 2018-04-29 ENCOUNTER — Emergency Department (HOSPITAL_COMMUNITY): Payer: Medicare Other

## 2018-04-29 ENCOUNTER — Encounter (HOSPITAL_COMMUNITY): Payer: Self-pay | Admitting: Emergency Medicine

## 2018-04-29 ENCOUNTER — Emergency Department (HOSPITAL_COMMUNITY)
Admission: EM | Admit: 2018-04-29 | Discharge: 2018-04-29 | Disposition: A | Discharge status: Home / Self Care | Payer: Medicare Other | Attending: Emergency Medicine | Admitting: Emergency Medicine

## 2018-04-29 ENCOUNTER — Other Ambulatory Visit: Payer: Self-pay

## 2018-04-29 DIAGNOSIS — Z8542 Personal history of malignant neoplasm of other parts of uterus: Secondary | ICD-10-CM | POA: Insufficient documentation

## 2018-04-29 DIAGNOSIS — I110 Hypertensive heart disease with heart failure: Secondary | ICD-10-CM | POA: Insufficient documentation

## 2018-04-29 DIAGNOSIS — K429 Umbilical hernia without obstruction or gangrene: Secondary | ICD-10-CM | POA: Diagnosis not present

## 2018-04-29 DIAGNOSIS — S0083XA Contusion of other part of head, initial encounter: Secondary | ICD-10-CM | POA: Insufficient documentation

## 2018-04-29 DIAGNOSIS — Y92193 Bedroom in other specified residential institution as the place of occurrence of the external cause: Secondary | ICD-10-CM | POA: Insufficient documentation

## 2018-04-29 DIAGNOSIS — Z79899 Other long term (current) drug therapy: Secondary | ICD-10-CM | POA: Diagnosis not present

## 2018-04-29 DIAGNOSIS — E039 Hypothyroidism, unspecified: Secondary | ICD-10-CM | POA: Diagnosis not present

## 2018-04-29 DIAGNOSIS — I5022 Chronic systolic (congestive) heart failure: Secondary | ICD-10-CM | POA: Diagnosis not present

## 2018-04-29 DIAGNOSIS — J45909 Unspecified asthma, uncomplicated: Secondary | ICD-10-CM | POA: Insufficient documentation

## 2018-04-29 DIAGNOSIS — M25561 Pain in right knee: Secondary | ICD-10-CM | POA: Insufficient documentation

## 2018-04-29 DIAGNOSIS — M79642 Pain in left hand: Secondary | ICD-10-CM | POA: Diagnosis not present

## 2018-04-29 DIAGNOSIS — Y9301 Activity, walking, marching and hiking: Secondary | ICD-10-CM | POA: Diagnosis not present

## 2018-04-29 DIAGNOSIS — Z7901 Long term (current) use of anticoagulants: Secondary | ICD-10-CM | POA: Diagnosis not present

## 2018-04-29 DIAGNOSIS — I2510 Atherosclerotic heart disease of native coronary artery without angina pectoris: Secondary | ICD-10-CM | POA: Insufficient documentation

## 2018-04-29 DIAGNOSIS — Y999 Unspecified external cause status: Secondary | ICD-10-CM | POA: Insufficient documentation

## 2018-04-29 DIAGNOSIS — F0390 Unspecified dementia without behavioral disturbance: Secondary | ICD-10-CM | POA: Insufficient documentation

## 2018-04-29 DIAGNOSIS — S0993XA Unspecified injury of face, initial encounter: Secondary | ICD-10-CM | POA: Diagnosis present

## 2018-04-29 DIAGNOSIS — W01198A Fall on same level from slipping, tripping and stumbling with subsequent striking against other object, initial encounter: Secondary | ICD-10-CM | POA: Insufficient documentation

## 2018-04-29 DIAGNOSIS — Z87891 Personal history of nicotine dependence: Secondary | ICD-10-CM | POA: Diagnosis not present

## 2018-04-29 DIAGNOSIS — M25562 Pain in left knee: Secondary | ICD-10-CM | POA: Diagnosis not present

## 2018-04-29 DIAGNOSIS — M25532 Pain in left wrist: Secondary | ICD-10-CM | POA: Diagnosis not present

## 2018-04-29 DIAGNOSIS — K439 Ventral hernia without obstruction or gangrene: Secondary | ICD-10-CM

## 2018-04-29 DIAGNOSIS — W19XXXA Unspecified fall, initial encounter: Secondary | ICD-10-CM

## 2018-04-29 LAB — BASIC METABOLIC PANEL
ANION GAP: 8 (ref 5–15)
BUN: 9 mg/dL (ref 6–20)
CO2: 29 mmol/L (ref 22–32)
Calcium: 9.6 mg/dL (ref 8.9–10.3)
Chloride: 103 mmol/L (ref 101–111)
Creatinine, Ser: 1 mg/dL (ref 0.44–1.00)
GFR, EST NON AFRICAN AMERICAN: 53 mL/min — AB (ref >60)
GLUCOSE: 102 mg/dL — AB (ref 65–99)
POTASSIUM: 4.7 mmol/L (ref 3.5–5.1)
SODIUM: 140 mmol/L (ref 135–145)

## 2018-04-29 LAB — CBC WITH DIFFERENTIAL/PLATELET
ABS IMMATURE GRANULOCYTES: 0 10*3/uL (ref 0.0–0.1)
BASOS ABS: 0 10*3/uL (ref 0.0–0.1)
BASOS PCT: 0 %
EOS ABS: 0.1 10*3/uL (ref 0.0–0.7)
Eosinophils Relative: 2 %
HCT: 42.8 % (ref 36.0–46.0)
Hemoglobin: 13.4 g/dL (ref 12.0–15.0)
IMMATURE GRANULOCYTES: 0 %
Lymphocytes Relative: 15 %
Lymphs Abs: 1 10*3/uL (ref 0.7–4.0)
MCH: 26.6 pg (ref 26.0–34.0)
MCHC: 31.3 g/dL (ref 30.0–36.0)
MCV: 84.9 fL (ref 78.0–100.0)
MONOS PCT: 7 %
Monocytes Absolute: 0.4 10*3/uL (ref 0.1–1.0)
NEUTROS PCT: 76 %
Neutro Abs: 5 10*3/uL (ref 1.7–7.7)
PLATELETS: 150 10*3/uL (ref 150–400)
RBC: 5.04 MIL/uL (ref 3.87–5.11)
RDW: 14.3 % (ref 11.5–15.5)
WBC: 6.6 10*3/uL (ref 4.0–10.5)

## 2018-04-29 LAB — URINALYSIS, ROUTINE W REFLEX MICROSCOPIC
BILIRUBIN URINE: NEGATIVE
Bacteria, UA: NONE SEEN
Glucose, UA: NEGATIVE mg/dL
Ketones, ur: NEGATIVE mg/dL
Leukocytes, UA: NEGATIVE
NITRITE: NEGATIVE
PH: 7 (ref 5.0–8.0)
Protein, ur: 30 mg/dL — AB
SPECIFIC GRAVITY, URINE: 1.013 (ref 1.005–1.030)

## 2018-04-29 LAB — I-STAT TROPONIN, ED: TROPONIN I, POC: 0.02 ng/mL (ref 0.00–0.08)

## 2018-04-29 MED ORDER — CARVEDILOL 3.125 MG PO TABS
3.1250 mg | ORAL_TABLET | Freq: Two times a day (BID) | ORAL | Status: DC
  Administered 2018-04-29: 3.125 mg via ORAL
  Filled 2018-04-29: qty 1

## 2018-04-29 MED ORDER — QUETIAPINE FUMARATE 100 MG PO TABS: 100.0000 mg | ORAL_TABLET | ORAL | Status: DC

## 2018-04-29 MED ORDER — SERTRALINE HCL 50 MG PO TABS: 50.0000 mg | ORAL_TABLET | Freq: Every day | ORAL | Status: DC

## 2018-04-29 NOTE — ED Notes (Signed)
Karen-(aware PT going to [facility])-[facility] Modified Lunch Tray Ordered @ 0910-per RN-called by Levada Dy

## 2018-04-29 NOTE — ED Notes (Signed)
Patient Alert and oriented to baseline. Stable and ambulatory to baseline. Patient verbalized understanding of the discharge instructions.  Patient belongings were taken by the patient.   

## 2018-04-29 NOTE — ED Notes (Signed)
When asked about medications patient stated she did not want any of her medications here and just wanted to go back to [employer].

## 2018-04-29 NOTE — ED Provider Notes (Signed)
Level V caveat dementia patient fell at [facility] from rehabilitation center this morning. Her daughter got a call from staff at 7:30 AM. Patient complains of headache and right knee pain since the fall. On exam patient is alert in no distress HEENT exam is a call pulseless hematoma to center of the forehead otherwise atraumatic neck is supple nontender chest nontender abdomen soft nontender pelvis stable. Right lower extremity tender at right knee. She has pain at right knee on internal rotation of the thigh. All other extremity is a contusion abrasion or tenderness neurovascularly intact. Results are as well. Cranial nerves II through XII grossly intact.   Orlie Dakin, MD 04/29/18 1037

## 2018-04-29 NOTE — ED Provider Notes (Signed)
[facility] EMERGENCY DEPARTMENT Provider Note   CSN: [id number] Arrival date & time: 04/29/18  4696     History   Chief Complaint Chief Complaint  Patient presents with  . fall on thinners    HPI Cindy [name] is a 77 y.o. female with history of dementia, atrial fibrillation on Xarelto and Coreg, asthma, CVA, CAD is here for evaluation after unwitnessed fall.  History is obtained from patient and from Child psychotherapist at [facility].  Patient is alert, oriented to self, place, time and events.  She is at baseline intermittently confused.  She states that she was in her room and walked to the bathroom on her own, tripped and fell forward hitting her forehead on hardwood floor.  States she immediately called for help and was able to stand up.  Currently she reports headache worse around left forehead and lightheadedness.  She is frequently asking for something to eat.  Also reports diffuse body pain that she attributes to the fall most significant at left knee and left hand.  She denies any vision changes, nausea, vomiting, chest pain, shortness of breath, numbness or weakness to her hands or feet.  I spoke to RN at [facility] who states approximately 7 AM patient called out for help, she was found on the ground in no distress.  Her blood pressure was obtained and it was 230/120 then 201/94.  RN reviewed patient's chart and her blood pressures typically 120s over 60s.  She has not had her morning medications.  Otherwise, patient has been acting at her baseline the last few days without fevers, cough or any other complaints.  Onset: Sudden.  Aggravating factors: Palpation.  No interventions PTA. HPI  Past Medical History:  Diagnosis Date  . A-fib ([place]) 01/07/2012  . ANEMIA 07/29/2010   Qualifier: Diagnosis of  By: Bobby Rumpf CMA (AAMA), Patty    . Angina pectoris ([place])   . Anxiety   . Asthma   . Asthma 01/07/2012  . Barrett's esophagus   . Bronchitis   . 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 Chest pain   . Coronary artery disease    right carotid occlusion   . Dementia with behavioral disturbance 09/02/2016  . Depression   . Dysrhythmia    atrial fibrillation  . GERD (gastroesophageal reflux disease)   . 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 Hyperlipidemia   . Hypertension   . Mood disorder ([place])   . Paranoia ([place]) 10/03/2016  . Recurrent upper respiratory infection (URI)   . Stroke [facility])    2007, after left endarterectomy    Patient Active Problem List   Diagnosis Date Noted  . 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 Insomnia 12/19/2017  . Urinary tract infection due to extended-spectrum beta lactamase (ESBL) producing Escherichia coli 11/17/2017  . Dysphagia, oropharyngeal 11/15/2017  . Altered mental status 11/10/2017  . Acute metabolic encephalopathy 29/52/8413  . Mania ([place]) 06/21/2017  . Hypomagnesemia 06/19/2017  . Essential hypertension 06/18/2017  . Acute encephalopathy 06/17/2017  . Acute lower UTI 06/17/2017  . E. coli UTI 06/17/2017  . Depression 04/24/2017  . Hypothyroidism 10/03/2016  . Paranoia ([place]) 10/03/2016  . LOC (loss of consciousness) ([place]) 09/02/2016  . 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 Dementia with behavioral disturbance 09/02/2016  . Asthma with acute exacerbation 08/21/2016  . Atrial fibrillation with RVR ([place]) 08/21/2016  . Elevated INR 08/21/2016  . Chronic systolic heart failure ([place]) 08/21/2016  .  Hyperlipidemia 08/21/2016  . Electrolyte abnormality 08/21/2016  . HCAP (healthcare-associated pneumonia) 08/10/2016  . Sepsis ([place]) 08/09/2016  . Coronary atherosclerosis of native coronary artery 01/21/2014  . Obesity 01/17/2014  . Abdominal pain, generalized 10/18/2012  . GERD (gastroesophageal reflux disease)   . Mood disorder ([place])   . Anxiety   . 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[place])   . Stroke ([place])   . Chest pain 01/07/2012  . Asthma 01/07/2012  . A-fib ([place])  01/07/2012  . 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 Anemia 01/07/2012  . ANEMIA 07/29/2010  . BARRETTS ESOPHAGUS 06/04/2010    Past Surgical History:  Procedure Laterality Date  . ABDOMINAL HYSTERECTOMY    . BALLOON DILATION  10/18/2012   Procedure: BALLOON DILATION;  Surgeon: Lear Ng, MD;  Location: [facility] END[facility];  Service: Endoscopy;  Laterality: N/A;  . CARDIAC CATHETERIZATION    . CAROTID ENDARTERECTOMY     left, complicated by stroke  . CHOLECYSTECTOMY     incidental at time of colectomy  . COLONOSCOPY  12/01/2011   Procedure: COLONOSCOPY;  Surgeon: Lear Ng, MD;  Location: [facility];  Service: Endoscopy;  Laterality: N/A;  . COLONOSCOPY  10/18/2012   Procedure: COLONOSCOPY;  Surgeon: Lear Ng, MD;  Location: [facility];  Service: Endoscopy;  Laterality: N/A;  colonic dilation  . ESOPHAGOGASTRODUODENOSCOPY  12/01/2011   Procedure: ESOPHAGOGASTRODUODENOSCOPY (EGD);  Surgeon: Lear Ng, MD;  Location: [facility];  Service: Endoscopy;  Laterality: N/A;  . HOT HEMOSTASIS  12/01/2011   Procedure: HOT HEMOSTASIS (ARGON PLASMA COAGULATION/BICAP);  Surgeon: Lear Ng, MD;  Location: [facility];  Service: Endoscopy;  Laterality: N/A;  . 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 Authorizing Provider  alendronate (FOSAMAX) 70 MG tablet Take 70 mg by mouth every Wednesday. Take with a full glass of water on an empty stomach.    [provider]  budesonide-formoterol (SYMBICORT) 160-4.5 MCG/ACT inhaler Inhale 2 puffs into the lungs 2 (two) times daily.    [provider]  carvedilol (COREG) 3.125 MG tablet Take 1 tablet (3.125 mg total) by mouth 2 (two) times daily with a meal. 08/14/16   Johnson, Clanford L, MD  fluticasone (FLONASE) 50  MCG/ACT nasal spray Place 2 sprays into both nostrils daily.    [provider]  isosorbide mononitrate (IMDUR) 30 MG 24 hr tablet Take 30 mg by mouth daily.    [provider]  levothyroxine (SYNTHROID, LEVOTHROID) 25 MCG tablet Take 25 mcg by mouth daily before breakfast.     [provider]  mirtazapine (REMERON) 15 MG tablet Take 7.5 mg by mouth at bedtime. 1/2 TAB    [provider]  pantoprazole sodium (PROTONIX) 40 mg/20 mL PACK Mix 1 packet on 1 teaspoonful of applesauce or 5 mL and take daily, DO NOT ADMINISTER WITH ANY OTHER LIQUIDS OR FOODS    [provider]  QUEtiapine (SEROQUEL) 100 MG tablet Take 100 mg by mouth every morning.    [provider]  QUEtiapine (SEROQUEL) 25 MG tablet Take 75 mg by mouth at bedtime.    [provider]  rivaroxaban (XARELTO) 10 MG TABS tablet Take 15 mg by mouth daily. 1 and 1/2 tablet  [provider]  sertraline (ZOLOFT) 50 MG tablet Take 50 mg by mouth daily.     [provider]  simvastatin (ZOCOR) 10 MG tablet Take 10 mg by mouth at bedtime.    [provider]  tiotropium (SPIRIVA) 18 MCG inhalation capsule Place 18 mcg into inhaler and inhale daily.    [provider]  traZODone (DESYREL) 150 MG tablet Take 150 mg by mouth at bedtime.     [provider]    Family History Family History  Problem Relation Age of Onset  . Heart disease Mother   . Cancer Mother   . Heart attack Father   . Cancer Sister        ovarian    Social History Social History   Tobacco Use  . Smoking status: Former Smoker    Last attempt to quit: 04/09/2003    Years since quitting: 15.0  . Smokeless tobacco: Never Used  Substance Use Topics  . Alcohol use: No  . Drug use: No     Allergies   Patient has no known allergies.   Review of Systems Review of Systems  Unable to perform ROS: Dementia  Skin: Positive for color change and wound.    Psychiatric/Behavioral: Positive for confusion (chronic).  All other systems reviewed and are negative.    Physical Exam Updated Vital Signs BP (!) 153/80   Pulse (!) 58   Temp 97.9 F (36.6 C) (Oral)   Resp (!) 23   Ht 5' (1.524 m)   Wt 63 kg (139 lb)   SpO2 96%   BMI 27.15 kg/m   Physical Exam  Constitutional: She is oriented to person, place, and time. She appears well-developed and well-nourished. She is cooperative. She is easily aroused. No distress.  HENT:  Head: Head is with contusion.  4 x 4 cm hematoma to left forehead appropriately tender, otherwise no tenderness, defect, crepitus or obvious deformity of scalp, facial or nasal bones No Raccoon's eyes. No Battle's sign. No hemotympanum or otorrhea, bilaterally. No epistaxis or rhinorrhea, septum midline.  No intraoral bleeding or injury. No teeth.   Eyes:  Lids normal. EOMs and PERRL intact without pain  Neck:  C-spine: no midline spinous process tenderness. No  cervical paraspinal muscular tenderness. Full active ROM of cervical spine w/o pain. Trachea midline  Cardiovascular: Normal rate, regular rhythm, S1 normal, S2 normal and normal heart sounds. Exam reveals no distant heart sounds.  Pulses:      Carotid pulses are 2+ on the right side, and 2+ on the left side.      Radial pulses are 2+ on the right side, and 2+ on the left side.       Dorsalis pedis pulses are 2+ on the right side, and 2+ on the left side.  2+ radial and DP pulses bilaterally  Pulmonary/Chest: Effort normal and breath sounds normal. She has no decreased breath sounds.  No chest wall tenderness. Equal and symmetric chest wall expansion   Abdominal: Soft.  Abdomen is soft NTND. No seatbelt sign.   Musculoskeletal: Normal range of motion. She exhibits tenderness. She exhibits no deformity.       Left wrist: She exhibits bony tenderness.       Left knee: Tenderness found.  Full PROM of upper and lower extremities without pain  Left hand:  Ecchymosis and tenderness to left thenar prominence.  No focal tenderness to scaphoid.  Full passive range of motion of fingers and wrist without pain.  Left knee: Diffuse, nonfocal anterior knee tenderness.  No skin injury.  Full passive range of motion of the knee without crepitus, normal J tracking of patella.  No laxity.  Negative drawer test.  T-spine: no paraspinal muscular tenderness or midline tenderness.    L-spine: no paraspinal muscular or midline tenderness.  Pelvis: No instability with AP/compression, leg shortening or rotation.  Full passive range of motion of hips without pain.  Patient is ambulatory with 1 person assist without reporting any pain.   Neurological: She is alert, oriented to person, place, and time and easily aroused.  Alert and oriented to self, place, time and event.  Speech is fluent without obvious dysarthria or dysphasia. Strength 5/5 with hand grip and ankle F/E.   Sensation to light touch intact in hands and feet. Normal gait. No pronator drift. No leg drop.  Normal finger-to-nose and finger tapping.  CN I and VIII not tested. CN II-XII grossly intact bilaterally.   Skin:  No ecchymosis, abrasions or signs of skin injury to back, buttocks     ED Treatments / Results  Labs (all labs ordered are listed, but only abnormal results are displayed) Labs Reviewed  BASIC METABOLIC PANEL - Abnormal; Notable for the following components:      Result Value   Glucose, Bld 102 (*)    GFR calc non Af Amer 53 (*)    All other components within normal limits  URINALYSIS, ROUTINE W REFLEX MICROSCOPIC - Abnormal; Notable for the following components:   Hgb urine dipstick SMALL (*)    Protein, ur 30 (*)    All other components within normal limits  CBC WITH DIFFERENTIAL/PLATELET  I-STAT TROPONIN, ED    EKG EKG Interpretation  Date/Time:  Saturday April 29 2018 08:48:28 EDT Ventricular Rate:  66 PR Interval:    QRS Duration: 95 QT Interval:  451 QTC  Calculation: 473 R Axis:   -16 Text Interpretation:  Atrial fibrillation Probable anterior infarct, old ST depr, consider ischemia, anterolateral lds Since last tracing rate slower Confirmed by [place], Inocente Salles [phone]) on 04/29/2018 8:53:54 AM   Radiology Ct Head Wo Contrast  Result Date: 04/29/2018 CLINICAL DATA:  S/p Fall, hit the left sided of her forehead. Denies LOC. Pt has hematoma to the left side of her forehead. Hx: Stroke, HTN, Dementia EXAM: CT HEAD WITHOUT CONTRAST CT CERVICAL SPINE WITHOUT CONTRAST TECHNIQUE: Multidetector CT imaging of the head and cervical spine was performed following the standard protocol without intravenous contrast. Multiplanar CT image reconstructions of the cervical spine were also generated. COMPARISON:  11/08/2017. FINDINGS: CT HEAD FINDINGS Brain: No evidence of acute infarction, hemorrhage, hydrocephalus, extra-axial collection or mass lesion/mass effect. Well-defined hypoattenuation with encephalomalacia in the right parietooccipital lobes, and hypoattenuation extending from the posterior, medial deep left frontal lobe white matter to the left posterosuperior frontal lobe and left parietal lobe, are stable consistent with old infarcts. There is ventricular and sulcal enlargement reflecting mild to moderate diffuse atrophy. Patchy white matter hypoattenuation is also noted consistent with chronic microvascular ischemic change. Vascular: No hyperdense vessel or unexpected calcification. Skull: Normal. Negative for fracture or focal lesion. Sinuses/Orbits: Globes and orbits are unremarkable. Sinuses are clear. Many of the right mastoid air cells show fluid attenuation, stable from the prior exam consistent with chronic effusion. Other: None. CT CERVICAL SPINE FINDINGS Alignment: Normal. Skull base and vertebrae: No acute fracture. No primary bone lesion or focal pathologic process. Soft tissues and spinal canal: No prevertebral fluid or swelling. No visible canal  hematoma.  Disc levels: Mild to moderate loss of disc height at C5-C6 and C6-C7 with mild diffuse spondylotic disc bulging. There are facet degenerative changes bilaterally, greatest on the right at C3-C4. No convincing disc herniation. Upper chest: No acute findings. 1.7 cm right thyroid nodule. Carotid artery vascular calcifications, greater on the right. Peripheral interstitial scarring at the apices. Other: None. IMPRESSION: HEAD CT 1. No acute intracranial abnormalities. 2. Old infarcts, atrophy and chronic microvascular ischemic changes similar to the prior exam. CERVICAL CT 1. No fracture or acute finding. 2. Right thyroid nodule stable from a cervical CT dated 04/26/2016. Electronically Signed   By: Lajean Manes M.D.   On: 04/29/2018 10:49   Ct Cervical Spine Wo Contrast  Result Date: 04/29/2018 CLINICAL DATA:  S/p Fall, hit the left sided of her forehead. Denies LOC. Pt has hematoma to the left side of her forehead. Hx: Stroke, HTN, Dementia EXAM: CT HEAD WITHOUT CONTRAST CT CERVICAL SPINE WITHOUT CONTRAST TECHNIQUE: Multidetector CT imaging of the head and cervical spine was performed following the standard protocol without intravenous contrast. Multiplanar CT image reconstructions of the cervical spine were also generated. COMPARISON:  11/08/2017. FINDINGS: CT HEAD FINDINGS Brain: No evidence of acute infarction, hemorrhage, hydrocephalus, extra-axial collection or mass lesion/mass effect. Well-defined hypoattenuation with encephalomalacia in the right parietooccipital lobes, and hypoattenuation extending from the posterior, medial deep left frontal lobe white matter to the left posterosuperior frontal lobe and left parietal lobe, are stable consistent with old infarcts. There is ventricular and sulcal enlargement reflecting mild to moderate diffuse atrophy. Patchy white matter hypoattenuation is also noted consistent with chronic microvascular ischemic change. Vascular: No hyperdense vessel or unexpected  calcification. Skull: Normal. Negative for fracture or focal lesion. Sinuses/Orbits: Globes and orbits are unremarkable. Sinuses are clear. Many of the right mastoid air cells show fluid attenuation, stable from the prior exam consistent with chronic effusion. Other: None. CT CERVICAL SPINE FINDINGS Alignment: Normal. Skull base and vertebrae: No acute fracture. No primary bone lesion or focal pathologic process. Soft tissues and spinal canal: No prevertebral fluid or swelling. No visible canal hematoma. Disc levels: Mild to moderate loss of disc height at C5-C6 and C6-C7 with mild diffuse spondylotic disc bulging. There are facet degenerative changes bilaterally, greatest on the right at C3-C4. No convincing disc herniation. Upper chest: No acute findings. 1.7 cm right thyroid nodule. Carotid artery vascular calcifications, greater on the right. Peripheral interstitial scarring at the apices. Other: None. IMPRESSION: HEAD CT 1. No acute intracranial abnormalities. 2. Old infarcts, atrophy and chronic microvascular ischemic changes similar to the prior exam. CERVICAL CT 1. No fracture or acute finding. 2. Right thyroid nodule stable from a cervical CT dated 04/26/2016. Electronically Signed   By: Lajean Manes M.D.   On: 04/29/2018 10:49   Ct Pelvis Wo Contrast  Result Date: 04/29/2018 CLINICAL DATA:  Bilateral hip pain after a fall today. Initial encounter. EXAM: CT PELVIS WITHOUT CONTRAST TECHNIQUE: Multidetector CT imaging of the pelvis was performed following the standard protocol without intravenous contrast. COMPARISON:  Pelvic radiograph 04/26/2016. CT abdomen and pelvis 05/29/2012. FINDINGS: Urinary Tract: No distal ureteral dilatation. Under distended bladder though with apparent moderate diffuse bladder wall thickening. Bowel: Mild sigmoid colon diverticulosis without evidence of diverticulitis. Partially visualized colonic dilatation near the splenic flexure at an anastomotic staple line, chronic based  on the 2013 CT. Partially visualized supraumbilical hernia now containing a portion of transverse colon without evidence of proximal bowel dilatation to indicate obstruction. Vascular/Lymphatic: Extensive  aortoiliac atherosclerosis. No enlarged lymph nodes. Reproductive:  Status post hysterectomy.  No adnexal mass. Other:  No intraperitoneal free fluid. Musculoskeletal: No acute fracture identified. No hip dislocation. Moderate left greater than right hip osteoarthrosis with joint space narrowing, marginal osteophyte formation, and subchondral cyst formation. Degenerative changes of the pubic symphysis and SI joints. Focally advanced disc degeneration at L4-5. Advanced L5-S1 facet arthrosis with grade 1 anterolisthesis at this level as well as at L3-4. IMPRESSION: 1. Bilateral hip osteoarthrosis without evidence of acute osseous abnormality. 2. Supraumbilical hernia now containing a portion of transverse colon, incompletely imaged. 3. Diffuse bladder wall thickening, correlate for cystitis. 4.  Aortic Atherosclerosis (ICD10-I70.0). Electronically Signed   By: Logan Bores M.D.   On: 04/29/2018 13:51   Dg Knee Complete 4 Views Left  Result Date: 04/29/2018 CLINICAL DATA:  Pt fell this am trying to go to the bathroom. Pt is having left knee and hand pain and is also having pain in her forehead where she hit her head on the floor. EXAM: LEFT KNEE - COMPLETE 4+ VIEW COMPARISON:  None. FINDINGS: No fracture.  No bone lesion. There is lateral patellofemoral joint space compartment narrowing. Marginal osteophytes project from all 3 compartments. Small joint effusion. Posterior vascular calcifications. IMPRESSION: 1. No fracture, dislocation or acute finding. 2. Moderate arthropathic changes. 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 3. Small joint effusion. Electronically Signed   By: Lajean Manes M.D.   On: 04/29/2018 10:06   Dg Knee  Complete 4 Views Right  Result Date: 04/29/2018 CLINICAL DATA:  77 year old female with right knee pain and swelling after falling earlier today EXAM: RIGHT KNEE - COMPLETE 4+ VIEW COMPARISON:  None. FINDINGS: Advanced tricompartmental degenerative osteoarthritis most severe in the patellofemoral and lateral compartments were there is significant joint space narrowing and osteophyte production. No definite acute fracture or malalignment. Small suprapatellar knee joint effusion may be degenerative. Extensive atherosclerotic vascular calcifications are present in the distal superficial femoral artery and throughout the popliteal artery. No focal soft tissue abnormality. IMPRESSION: 1. No acute fracture or malalignment. 2. 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 3. Small suprapatellar knee joint effusion. 4. Femoropopliteal atherosclerotic vascular calcifications. Electronically Signed   By: Jacqulynn Cadet M.D.   On: 04/29/2018 10:55   Dg Hand Complete Left  Result Date: 04/29/2018 CLINICAL DATA:  Left hand pain due to an injury sustained in a fall in the bathroom this morning. Initial encounter. EXAM: LEFT HAND - COMPLETE 3+ VIEW COMPARISON:  None. FINDINGS: No acute bony or joint abnormality is identified. The patient has moderately severe first CMC osteoarthritis. Soft tissues are unremarkable. IMPRESSION: No acute abnormality. 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 Electronically Signed   By: Inge Rise M.D.   On: 04/29/2018 10:04    Procedures Procedures (including critical care time)  Medications Ordered in ED Medications  carvedilol (COREG) tablet 3.125 mg (has no administration in time range)  QUEtiapine (SEROQUEL) tablet 100 mg (has no administration in time range)  sertraline (ZOLOFT) tablet 50 mg (has no administration in time range)     Initial Impression / Assessment and Plan / ED Course  I have  reviewed the triage vital signs and the nursing notes.  Pertinent labs & imaging results that were available during my care of the patient were reviewed by me and considered in my medical decision making (see chart for details).  Clinical Course as of  Apr 29 1444  Sat Apr 29, 2018  1403 Partially visualized supraumbilical hernia now containing a portion of transverse colon without evidence of proximal bowel dilatation to indicate obstruction.    CT PELVIS WO CONTRAST [CG]    Clinical Course User Index [CG] Kinnie Feil, PA-C   77 year old with history of dementia, A. fib, Xarelto here for unwitnessed fall complaining of headache and lightheadedness.  Exam as above shows hematoma to left forehead, left thenar prominence, left knee.  History is unreliable given dementia.  Given age, anticoagulants, risk will obtain screening labs, imaging.  Patient and RN at [facility] deny any infectious symptoms, changes in behavior pre/post fall.  Final Clinical Impressions(s) / ED Diagnoses   1440: Labs, imaging, UA, EKG, trop reviewed and unremarkable. She has no respiratory symptoms, fevers, chills, doubt PNA. Pt has been ambulatory with one person assist which is her baseline in the ER. Tolerating PO.  VSS. CT pelvis shows periumbilical hernia with some bowel but no signs of gangrene or obstruction.  She is non tender here. I explained this to pt and documented on her AVS. She is to f/u with PCP in 48 hours for re-evaluation and to ensure no new abd pain or symptoms emerged.  Pt deemed appropriate for discharge with close f/u.    Final diagnoses:  Fall, initial encounter  Supraumbilical hernia without gangrene and without obstruction    ED Discharge Orders    None       Kinnie Feil, PA-C 04/29/18 1445    Orlie Dakin, MD 04/29/18 1651

## 2018-04-29 NOTE — ED Triage Notes (Signed)
Pt to ed via [facility] from [facility] after fall hitting left side of forehead- no LOC -- pt is in memory care unit, but is aware date, time, person. Pt has hematoma to left forehead area.

## 2018-04-29 NOTE — Discharge Instructions (Signed)
Tylenol as needed for diffuse body aches and pains from fall.   CT scans and imaging did not show any acute injuries. You do have a hernia that is containing small amount of bowel, this is not causing your problems, pain, nausea, vomiting, fevers and is likely uncomplicated.   Return to ER for fevers, chills, nausea, vomiting, abdominal pain, constipation, blood in stools.   Follow up with your primary care doctor in 48 hours to check in and ensure abdominal hernia is not causing you any new issues.

## 2018-04-29 NOTE — ED Notes (Signed)
Patient initially refused the Coreg tablet, upon discharge this RN convinced patient the need to have the tablet.  Pateint agreed to take tablet prior to discharge.

## 2018-05-01 ENCOUNTER — Encounter: Payer: Self-pay | Admitting: Internal Medicine

## 2018-05-01 ENCOUNTER — Non-Acute Institutional Stay (SKILLED_NURSING_FACILITY): Payer: Medicare Other | Admitting: Internal Medicine

## 2018-05-01 DIAGNOSIS — R42 Dizziness and giddiness: Secondary | ICD-10-CM

## 2018-05-01 DIAGNOSIS — F0781 Postconcussional syndrome: Secondary | ICD-10-CM | POA: Diagnosis not present

## 2018-05-01 NOTE — Progress Notes (Signed)
Location:  [facility] Room Number: [phone] Place of Service:  SNF ([id number])  Cindy Duos, MD  Patient Care Team: Cindy Duos, MD as PCP - General (Internal Medicine)  Extended Emergency Contact Information Primary Emergency Contact: Cindy,[name] Address: [address] [place], [place] [address] Cindy [name] Work Phone: [phone] Mobile Phone: [phone] Relation: Daughter Secondary Emergency Contact: Cindy, [name] Mobile Phone: [phone] Relation: Son    Allergies: Patient has no known allergies.  Chief Complaint  Patient presents with  . Acute Visit    HPI: Patient is 77 y.o. female who nursing asked me to see because patient complains of dizziness.  Patient fell and hit her head 2 days ago.  There was no loss of consciousness but she did have a hematoma in the middle of her forehead.  Patient was seen in the emergency department and all x-rays and tests were normal.  Today patient is found in bed.  She admits that her dizziness is like the room spinning or that she is spinning.  She admits that she feels little more tired than usual.  She has no focal weakness or difficulty holding things or doing her ADLs or any other symptoms other than the dizziness.  Past Medical History:  Diagnosis Date  . A-fib ([place]) 01/07/2012  . ANEMIA 07/29/2010   Qualifier: Diagnosis of  By: Bobby Rumpf CMA (AAMA), [place]    . Angina pectoris ([place])   . Anxiety   . Asthma   . Asthma 01/07/2012  . Barrett's esophagus   . Bronchitis   . 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 Chest pain   . Coronary artery disease    right carotid occlusion   . Dementia with behavioral disturbance 09/02/2016  . Depression   . Dysrhythmia    atrial fibrillation  . GERD (gastroesophageal reflux disease)   . 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 Hyperlipidemia   . Hypertension   . Mood disorder ([place])   . Paranoia ([place]) 10/03/2016  . Recurrent upper respiratory  infection (URI)   . Stroke [facility])    2007, after left endarterectomy    Past Surgical History:  Procedure Laterality Date  . ABDOMINAL HYSTERECTOMY    . BALLOON DILATION  10/18/2012   Procedure: BALLOON DILATION;  Surgeon: Lear Ng, MD;  Location: [facility] ENDOSCOPY;  Service: Endoscopy;  Laterality: N/A;  . CARDIAC CATHETERIZATION    . CAROTID ENDARTERECTOMY     left, complicated by stroke  . CHOLECYSTECTOMY     incidental at time of colectomy  . COLONOSCOPY  12/01/2011   Procedure: COLONOSCOPY;  Surgeon: Lear Ng, MD;  Location: [facility] ENDOSCOPY;  Service: Endoscopy;  Laterality: N/A;  . COLONOSCOPY  10/18/2012   Procedure: COLONOSCOPY;  Surgeon: Lear Ng, MD;  Location: [facility] ENDOSCOPY;  Service: Endoscopy;  Laterality: N/A;  colonic dilation  . ESOPHAGOGASTRODUODENOSCOPY  12/01/2011   Procedure: ESOPHAGOGASTRODUODENOSCOPY (EGD);  Surgeon: Lear Ng, MD;  Location: [facility];  Service: Endoscopy;  Laterality: N/A;  . HOT HEMOSTASIS  12/01/2011   Procedure: HOT HEMOSTASIS (ARGON PLASMA COAGULATION/BICAP);  Surgeon: Lear Ng, MD;  Location: [facility];  Service: Endoscopy;  Laterality: N/A;  . 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 Always use your most recent med list.          alendronate 70 MG tablet Commonly known as:  FOSAMAX Take 70 mg by mouth every Wednesday. Take with a full glass of water on an empty stomach.   budesonide-formoterol 160-4.5 MCG/ACT inhaler Commonly known as:  SYMBICORT Inhale 2 puffs into the lungs 2 (two) times daily.   carvedilol 3.125 MG tablet Commonly known as:  COREG Take 1 tablet (3.125 mg total) by mouth 2 (two) times daily with a meal.   fluticasone 50 MCG/ACT nasal spray Commonly known as:  FLONASE Place 1 spray into both nostrils daily.     isosorbide mononitrate 30 MG 24 hr tablet Commonly known as:  IMDUR Take 30 mg by mouth daily.   levothyroxine 25 MCG tablet Commonly known as:  SYNTHROID, LEVOTHROID Take 25 mcg by mouth daily before breakfast.   mirtazapine 15 MG tablet Commonly known as:  REMERON Take 7.5 mg by mouth at bedtime. 1/2 TAB   PROTONIX 40 mg/20 mL Pack Generic drug:  pantoprazole sodium Mix 1 packet on 1 teaspoonful of applesauce or 5 mL and take daily, DO NOT ADMINISTER WITH ANY OTHER LIQUIDS OR FOODS   QUEtiapine 100 MG tablet Commonly known as:  SEROQUEL Take 100 mg by mouth every morning.   QUEtiapine 25 MG tablet Commonly known as:  SEROQUEL Take 75 mg by mouth at bedtime.   rivaroxaban 10 MG Tabs tablet Commonly known as:  XARELTO Take 15 mg by mouth daily. 1 and 1/2 tablet   sertraline 50 MG tablet Commonly known as:  ZOLOFT Take 50 mg by mouth daily.   simvastatin 10 MG tablet Commonly known as:  ZOCOR Take 10 mg by mouth at bedtime.   tiotropium 18 MCG inhalation capsule Commonly known as:  SPIRIVA Place 18 mcg into inhaler and inhale daily.   traZODone 150 MG tablet Commonly known as:  DESYREL Take 150 mg by mouth at bedtime.       No orders of the defined types were placed in this encounter.   Immunization History  Administered Date(s) Administered  . Influenza-Unspecified 08/26/2016, 10/21/2017  . PPD Test 08/16/2016  . Pneumococcal Polysaccharide-23 01/08/2012    Social History   Tobacco Use  . Smoking status: Former Smoker    Last attempt to quit: 04/09/2003    Years since quitting: 15.0  . Smokeless tobacco: Never Used  Substance Use Topics  . Alcohol use: No    Review of Systems  DATA OBTAINED: from patient, nurse GENERAL:  no fevers, fatigue, appetite changes SKIN: No itching, rash HEENT: No complaint RESPIRATORY: No cough, wheezing, SOB CARDIAC: No chest pain, palpitations, lower extremity edema  GI: No abdominal pain, No N/V/D or  constipation, No heartburn or reflux  GU: No dysuria, frequency or urgency, or incontinence  MUSCULOSKELETAL: No unrelieved bone/joint pain NEUROLOGIC: No headache, dizziness  PSYCHIATRIC: No overt anxiety or sadness  Vitals:   05/01/18 1453  BP: (!) 116/58  Pulse: 78  Resp: 18  Temp: (!) 97 F (36.1 C)  SpO2: 96%   Body mass index is 27.15 kg/m. Physical Exam  GENERAL APPEARANCE: Alert, conversant, No acute distress  SKIN: No diaphoresis rash HEENT: Unremarkable RESPIRATORY: Breathing is even, unlabored. Lung sounds are clear   CARDIOVASCULAR: Heart RRR no murmurs, rubs or gallops. No peripheral edema  GASTROINTESTINAL: Abdomen is soft, non-tender, not distended w/ normal bowel sounds.  GENITOURINARY: Bladder non tender, not distended  MUSCULOSKELETAL: No abnormal joints or musculature NEUROLOGIC: Cranial nerves 2-12 grossly intact. Moves all extremities PSYCHIATRIC: Mood and affect appropriate to situation, no behavioral issues  Patient Active Problem List   Diagnosis Date Noted  . Psychoses ([place]) 12/19/2017  . Insomnia 12/19/2017  . Urinary tract infection due to extended-spectrum beta lactamase (ESBL) producing Escherichia coli 11/17/2017  . Dysphagia, oropharyngeal 11/15/2017  . Altered mental status 11/10/2017  . Acute metabolic encephalopathy 64/40/3474  . Mania ([place]) 06/21/2017  . Hypomagnesemia 06/19/2017  . Essential hypertension 06/18/2017  . Acute encephalopathy 06/17/2017  . Acute lower UTI 06/17/2017  . E. coli UTI 06/17/2017  . Depression 04/24/2017  . Hypothyroidism 10/03/2016  . Paranoia ([place]) 10/03/2016  . LOC (loss of consciousness) ([place]) 09/02/2016  . 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 Dementia with behavioral disturbance 09/02/2016  . Asthma with acute exacerbation 08/21/2016  . Atrial fibrillation with RVR ([place]) 08/21/2016  . Elevated INR 08/21/2016  . Chronic systolic heart failure ([place]) 08/21/2016  . Hyperlipidemia 08/21/2016    . Electrolyte abnormality 08/21/2016  . HCAP (healthcare-associated pneumonia) 08/10/2016  . Sepsis ([place]) 08/09/2016  . Coronary atherosclerosis of native coronary artery 01/21/2014  . Obesity 01/17/2014  . Abdominal pain, generalized 10/18/2012  . GERD (gastroesophageal reflux disease)   . Mood disorder ([place])   . Anxiety   . 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[place])   . Stroke ([place])   . Chest pain 01/07/2012  . Asthma 01/07/2012  . A-fib ([place]) 01/07/2012  . 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 Anemia 01/07/2012  . ANEMIA 07/29/2010  . BARRETTS ESOPHAGUS 06/04/2010    CMP     Component Value Date/Time   NA 140 04/29/2018 0938   NA 142 12/21/2017   K 4.7 04/29/2018 0938   CL 103 04/29/2018 0938   CO2 29 04/29/2018 0938   GLUCOSE 102 (H) 04/29/2018 0938   BUN 9 04/29/2018 0938   BUN 19 12/21/2017   CREATININE 1.00 04/29/2018 0938   CALCIUM 9.6 04/29/2018 0938   PROT 6.9 11/08/2017 1512   ALBUMIN 3.8 11/08/2017 1512   AST 18 11/08/2017 1512   ALT 20 11/08/2017 1512   ALKPHOS 64 11/08/2017 1512   BILITOT 0.9 11/08/2017 1512   GFRNONAA 53 (L) 04/29/2018 0938   GFRAA >60 04/29/2018 0938   Recent Labs    06/18/17 0346 06/19/17 0431 11/08/17 1512 11/09/17 0822 11/15/17 12/21/17 04/29/18 0938  NA 140 138 141 143 137 142 140  K 3.7 3.5 3.7 3.6 3.9 4.2 4.7  CL 107 107 105 112*  --   --  103  CO2 24 24 26 23   --   --  29  GLUCOSE 100* 102* 150* 109*  --   --  102*  BUN 9 10 20 16 11 19 9   CREATININE 0.94 0.94 0.94 0.86 0.8 1.0 1.00  CALCIUM 8.9 8.9 9.9 9.5  --   --  9.6  MG 1.7 1.6*  --   --   --   --   --   PHOS 3.4 3.0  --   --   --   --   --    Recent Labs    06/18/17 0346 06/19/17 0431 11/08/17 1512  AST 14* 16 18  ALT 10* 14 20  ALKPHOS 52 53 64  BILITOT 0.5 0.7 0.9  PROT 5.6* 5.7* 6.9  ALBUMIN 3.1* 3.1* 3.8   Recent Labs    06/19/17 0431 11/08/17 1512  11/09/17 0822 11/15/17 12/21/17 04/29/18 0938  WBC  6.5 7.0 6.4 9.3 5.0 6.6  NEUTROABS 4.2 5.4  --   --   --  5.0  HGB 12.2 13.8 12.8 13.6 11.7* 13.4  HCT 37.3 42.8 40.0 41 37 42.8  MCV 84.8 87.2 87.9  --   --  84.9  PLT 147* 233 209 193 162 150   Recent Labs    06/19/17 0433  CHOL 109  LDLCALC 37  TRIG 68   No results found for: [facility] Lab Results  Component Value Date   TSH 1.680 11/08/2017   Lab Results  Component Value Date   HGBA1C 5.4 04/08/2017   Lab Results  Component Value Date   CHOL 109 06/19/2017   HDL 58 06/19/2017   LDLCALC 37 06/19/2017   TRIG 68 06/19/2017   CHOLHDL 1.9 06/19/2017    Significant Diagnostic Results in last 30 days:  Ct Head Wo Contrast  Result Date: 04/29/2018 CLINICAL DATA:  S/p Fall, hit the left sided of her forehead. Denies LOC. Pt has hematoma to the left side of her forehead. Hx: Stroke, HTN, Dementia EXAM: CT HEAD WITHOUT CONTRAST CT CERVICAL SPINE WITHOUT CONTRAST TECHNIQUE: Multidetector CT imaging of the head and cervical spine was performed following the standard protocol without intravenous contrast. Multiplanar CT image reconstructions of the cervical spine were also generated. COMPARISON:  11/08/2017. FINDINGS: CT HEAD FINDINGS Brain: No evidence of acute infarction, hemorrhage, hydrocephalus, extra-axial collection or mass lesion/mass effect. Well-defined hypoattenuation with encephalomalacia in the right parietooccipital lobes, and hypoattenuation extending from the posterior, medial deep left frontal lobe white matter to the left posterosuperior frontal lobe and left parietal lobe, are stable consistent with old infarcts. There is ventricular and sulcal enlargement reflecting mild to moderate diffuse atrophy. Patchy white matter hypoattenuation is also noted consistent with chronic microvascular ischemic change. Vascular: No hyperdense vessel or unexpected calcification. Skull: Normal. Negative for fracture or focal lesion. Sinuses/Orbits: Globes and orbits are unremarkable.  Sinuses are clear. Many of the right mastoid air cells show fluid attenuation, stable from the prior exam consistent with chronic effusion. Other: None. CT CERVICAL SPINE FINDINGS Alignment: Normal. Skull base and vertebrae: No acute fracture. No primary bone lesion or focal pathologic process. Soft tissues and spinal canal: No prevertebral fluid or swelling. No visible canal hematoma. Disc levels: Mild to moderate loss of disc height at C5-C6 and C6-C7 with mild diffuse spondylotic disc bulging. There are facet degenerative changes bilaterally, greatest on the right at C3-C4. No convincing disc herniation. Upper chest: No acute findings. 1.7 cm right thyroid nodule. Carotid artery vascular calcifications, greater on the right. Peripheral interstitial scarring at the apices. Other: None. IMPRESSION: HEAD CT 1. No acute intracranial abnormalities. 2. Old infarcts, atrophy and chronic microvascular ischemic changes similar to the prior exam. CERVICAL CT 1. No fracture or acute finding. 2. Right thyroid nodule stable from a cervical CT dated 04/26/2016. Electronically Signed   By: Lajean Manes M.D.   On: 04/29/2018 10:49   Ct Cervical Spine Wo Contrast  Result Date: 04/29/2018 CLINICAL DATA:  S/p Fall, hit the left sided of her forehead. Denies LOC. Pt has hematoma to the left side of her forehead. Hx: Stroke, HTN, Dementia EXAM: CT HEAD WITHOUT CONTRAST CT CERVICAL SPINE WITHOUT CONTRAST TECHNIQUE: Multidetector CT imaging of the head and cervical spine was performed following the standard protocol without intravenous contrast. Multiplanar CT image reconstructions of the cervical spine were also generated. COMPARISON:  11/08/2017. FINDINGS: CT HEAD  FINDINGS Brain: No evidence of acute infarction, hemorrhage, hydrocephalus, extra-axial collection or mass lesion/mass effect. Well-defined hypoattenuation with encephalomalacia in the right parietooccipital lobes, and hypoattenuation extending from the posterior,  medial deep left frontal lobe white matter to the left posterosuperior frontal lobe and left parietal lobe, are stable consistent with old infarcts. There is ventricular and sulcal enlargement reflecting mild to moderate diffuse atrophy. Patchy white matter hypoattenuation is also noted consistent with chronic microvascular ischemic change. Vascular: No hyperdense vessel or unexpected calcification. Skull: Normal. Negative for fracture or focal lesion. Sinuses/Orbits: Globes and orbits are unremarkable. Sinuses are clear. Many of the right mastoid air cells show fluid attenuation, stable from the prior exam consistent with chronic effusion. Other: None. CT CERVICAL SPINE FINDINGS Alignment: Normal. Skull base and vertebrae: No acute fracture. No primary bone lesion or focal pathologic process. Soft tissues and spinal canal: No prevertebral fluid or swelling. No visible canal hematoma. Disc levels: Mild to moderate loss of disc height at C5-C6 and C6-C7 with mild diffuse spondylotic disc bulging. There are facet degenerative changes bilaterally, greatest on the right at C3-C4. No convincing disc herniation. Upper chest: No acute findings. 1.7 cm right thyroid nodule. Carotid artery vascular calcifications, greater on the right. Peripheral interstitial scarring at the apices. Other: None. IMPRESSION: HEAD CT 1. No acute intracranial abnormalities. 2. Old infarcts, atrophy and chronic microvascular ischemic changes similar to the prior exam. CERVICAL CT 1. No fracture or acute finding. 2. Right thyroid nodule stable from a cervical CT dated 04/26/2016. Electronically Signed   By: Lajean Manes M.D.   On: 04/29/2018 10:49   Ct Pelvis Wo Contrast  Result Date: 04/29/2018 CLINICAL DATA:  Bilateral hip pain after a fall today. Initial encounter. EXAM: CT PELVIS WITHOUT CONTRAST TECHNIQUE: Multidetector CT imaging of the pelvis was performed following the standard protocol without intravenous contrast. COMPARISON:   Pelvic radiograph 04/26/2016. CT abdomen and pelvis 05/29/2012. FINDINGS: Urinary Tract: No distal ureteral dilatation. Under distended bladder though with apparent moderate diffuse bladder wall thickening. Bowel: Mild sigmoid colon diverticulosis without evidence of diverticulitis. Partially visualized colonic dilatation near the splenic flexure at an anastomotic staple line, chronic based on the 2013 CT. Partially visualized supraumbilical hernia now containing a portion of transverse colon without evidence of proximal bowel dilatation to indicate obstruction. Vascular/Lymphatic: Extensive aortoiliac atherosclerosis. No enlarged lymph nodes. Reproductive:  Status post hysterectomy.  No adnexal mass. Other:  No intraperitoneal free fluid. Musculoskeletal: No acute fracture identified. No hip dislocation. Moderate left greater than right hip osteoarthrosis with joint space narrowing, marginal osteophyte formation, and subchondral cyst formation. Degenerative changes of the pubic symphysis and SI joints. Focally advanced disc degeneration at L4-5. Advanced L5-S1 facet arthrosis with grade 1 anterolisthesis at this level as well as at L3-4. IMPRESSION: 1. Bilateral hip osteoarthrosis without evidence of acute osseous abnormality. 2. Supraumbilical hernia now containing a portion of transverse colon, incompletely imaged. 3. Diffuse bladder wall thickening, correlate for cystitis. 4.  Aortic Atherosclerosis (ICD10-I70.0). Electronically Signed   By: Logan Bores M.D.   On: 04/29/2018 13:51   Dg Knee Complete 4 Views Left  Result Date: 04/29/2018 CLINICAL DATA:  Pt fell this am trying to go to the bathroom. Pt is having left knee and hand pain and is also having pain in her forehead where she hit her head on the floor. EXAM: LEFT KNEE - COMPLETE 4+ VIEW COMPARISON:  None. FINDINGS: No fracture.  No bone lesion. There is lateral patellofemoral joint space compartment narrowing. Marginal  osteophytes project from all 3  compartments. Small joint effusion. Posterior vascular calcifications. IMPRESSION: 1. No fracture, dislocation or acute finding. 2. Moderate arthropathic changes. 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 3. Small joint effusion. Electronically Signed   By: Lajean Manes M.D.   On: 04/29/2018 10:06   Dg Knee Complete 4 Views Right  Result Date: 04/29/2018 CLINICAL DATA:  77 year old female with right knee pain and swelling after falling earlier today EXAM: RIGHT KNEE - COMPLETE 4+ VIEW COMPARISON:  None. FINDINGS: Advanced tricompartmental degenerative osteoarthritis most severe in the patellofemoral and lateral compartments were there is significant joint space narrowing and osteophyte production. No definite acute fracture or malalignment. Small suprapatellar knee joint effusion may be degenerative. Extensive atherosclerotic vascular calcifications are present in the distal superficial femoral artery and throughout the popliteal artery. No focal soft tissue abnormality. IMPRESSION: 1. No acute fracture or malalignment. 2. 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 3. Small suprapatellar knee joint effusion. 4. Femoropopliteal atherosclerotic vascular calcifications. Electronically Signed   By: Jacqulynn Cadet M.D.   On: 04/29/2018 10:55   Dg Hand Complete Left  Result Date: 04/29/2018 CLINICAL DATA:  Left hand pain due to an injury sustained in a fall in the bathroom this morning. Initial encounter. EXAM: LEFT HAND - COMPLETE 3+ VIEW COMPARISON:  None. FINDINGS: No acute bony or joint abnormality is identified. The patient has moderately severe first CMC osteoarthritis. Soft tissues are unremarkable. IMPRESSION: No acute abnormality. 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 Electronically Signed   By: Inge Rise M.D.   On: 04/29/2018 10:04     Assessment and Plan  Vertigo/postconcussive syndrome-have written patient for Antivert 25 mg 3 times daily; reassured her that this was normal and that she may feel bad for several more days a week may be even 2 weeks; told her not to push too hard and she understood; will monitor progress.    Time spent greater than 35 minutes Sayana Salley D. [name], MD

## 2018-05-11 ENCOUNTER — Non-Acute Institutional Stay (SKILLED_NURSING_FACILITY): Payer: Medicare Other | Admitting: Internal Medicine

## 2018-05-11 ENCOUNTER — Encounter: Payer: Self-pay | Admitting: Internal Medicine

## 2018-05-11 DIAGNOSIS — K219 Gastro-esophageal reflux disease without esophagitis: Secondary | ICD-10-CM | POA: Diagnosis not present

## 2018-05-11 DIAGNOSIS — I482 Chronic atrial fibrillation, unspecified: Secondary | ICD-10-CM

## 2018-05-11 DIAGNOSIS — I25119 Atherosclerotic heart disease of native coronary artery with unspecified angina pectoris: Secondary | ICD-10-CM

## 2018-05-11 NOTE — Progress Notes (Signed)
Location:  [facility] Room Number: 305-W Place of Service:  SNF ([id number])  [name], MD  Patient Care Team: [name], MD as PCP - General (Internal Medicine)  Extended Emergency Contact Information Primary Emergency Contact: Tyner,[name] Address: [address] [place], [place] [address] [name] Work Phone: [phone] Mobile Phone: [phone] Relation: Daughter Secondary Emergency Contact: Oneika, Simonian Mobile Phone: [phone] Relation: Son    Allergies: Patient has no known allergies.  Chief Complaint  Patient presents with  . Medical Management of Chronic Issues    Routine Visit    HPI: Patient is 77 y.o. female who is being seen for routine issues of chronic a fib, GERD and CAD.  Past Medical History:  Diagnosis Date  . A-fib ([place]) 01/07/2012  . ANEMIA 07/29/2010   Qualifier: Diagnosis of  By: Bobby Rumpf CMA (AAMA), [place]    . Angina pectoris ([place])   . Anxiety   . Asthma   . Asthma 01/07/2012  . Barrett's esophagus   . Bronchitis   . 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 Chest pain   . Coronary artery disease    right carotid occlusion   . Dementia with behavioral disturbance 09/02/2016  . Depression   . Dysrhythmia    atrial fibrillation  . GERD (gastroesophageal reflux disease)   . 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 Hyperlipidemia   . Hypertension   . Mood disorder ([place])   . Paranoia ([place]) 10/03/2016  . Recurrent upper respiratory infection (URI)   . Stroke [facility])    2007, after left endarterectomy    Past Surgical History:  Procedure Laterality Date  . ABDOMINAL HYSTERECTOMY    . BALLOON DILATION  10/18/2012   Procedure: BALLOON DILATION;  Surgeon: Lear Ng, MD;  [facility];  Service: Endoscopy;  Laterality: N/A;  . CARDIAC CATHETERIZATION    . CAROTID ENDARTERECTOMY     left, complicated by stroke  . CHOLECYSTECTOMY     incidental at time of colectomy  .  COLONOSCOPY  12/01/2011   Procedure: COLONOSCOPY;  Surgeon: Lear Ng, MD;  [facility];  Service: Endoscopy;  Laterality: N/A;  . COLONOSCOPY  10/18/2012   Procedure: COLONOSCOPY;  Surgeon: Lear Ng, MD;  [facility];  Service: Endoscopy;  Laterality: N/A;  colonic dilation  . ESOPHAGOGASTRODUODENOSCOPY  12/01/2011   Procedure: ESOPHAGOGASTRODUODENOSCOPY (EGD);  Surgeon: Lear Ng, MD;  Location[facility];  Service: Endoscopy;  Laterality: N/A;  . HOT HEMOSTASIS  12/01/2011   Procedure: HOT HEMOSTASIS (ARGON PLASMA COAGULATION/BICAP);  Surgeon: Lear Ng, MD;  Location: [facility];  Service: Endoscopy;  Laterality: N/A;  . 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 Always use your most recent med list.          alendronate 70 MG tablet Commonly known as:  FOSAMAX Take 70 mg by mouth every Wednesday. Take with a full glass of water on an empty stomach.   budesonide-formoterol 160-4.5 MCG/ACT inhaler Commonly known as:  SYMBICORT Inhale 2 puffs into the lungs 2 (two) times daily.   carvedilol 3.125 MG tablet Commonly known as:  COREG Take 1 tablet (3.125  mg total) by mouth 2 (two) times daily with a meal.   fluticasone 50 MCG/ACT nasal spray Commonly known as:  FLONASE Place 1 spray into both nostrils daily.   isosorbide mononitrate 30 MG 24 hr tablet Commonly known as:  IMDUR Take 30 mg by mouth daily.   levothyroxine 25 MCG tablet Commonly known as:  SYNTHROID, LEVOTHROID Take 25 mcg by mouth daily before breakfast.   mirtazapine 15 MG tablet Commonly known as:  REMERON Take 7.5 mg by mouth at bedtime. 1/2 TAB   PROTONIX 40 mg/20 mL Pack Generic drug:  pantoprazole sodium Mix 1 packet on 1 teaspoonful of applesauce or 5 mL and take daily, DO NOT  ADMINISTER WITH ANY OTHER LIQUIDS OR FOODS   QUEtiapine 100 MG tablet Commonly known as:  SEROQUEL Take 100 mg by mouth every morning.   QUEtiapine 25 MG tablet Commonly known as:  SEROQUEL Take 75 mg by mouth at bedtime.   rivaroxaban 10 MG Tabs tablet Commonly known as:  XARELTO Take 15 mg by mouth daily. 1 and 1/2 tablet   sertraline 50 MG tablet Commonly known as:  ZOLOFT Take 50 mg by mouth daily.   simvastatin 10 MG tablet Commonly known as:  ZOCOR Take 10 mg by mouth at bedtime.   tiotropium 18 MCG inhalation capsule Commonly known as:  SPIRIVA Place 18 mcg into inhaler and inhale daily.   traZODone 150 MG tablet Commonly known as:  DESYREL Take 150 mg by mouth at bedtime.       No orders of the defined types were placed in this encounter.   Immunization History  Administered Date(s) Administered  . Influenza-Unspecified 08/26/2016, 10/21/2017  . PPD Test 08/16/2016  . Pneumococcal Polysaccharide-23 01/08/2012    Social History   Tobacco Use  . Smoking status: Former Smoker    Last attempt to quit: 04/09/2003    Years since quitting: 15.1  . Smokeless tobacco: Never Used  Substance Use Topics  . Alcohol use: No    Review of Systems  DATA OBTAINED: from patient, nurse GENERAL:  no fevers, fatigue, appetite changes SKIN: No itching, rash HEENT: No complaint RESPIRATORY: No cough, wheezing, SOB CARDIAC: No chest pain, palpitations, lower extremity edema  GI: No abdominal pain, No N/V/D or constipation, No heartburn or reflux  GU: No dysuria, frequency or urgency, or incontinence  MUSCULOSKELETAL: No unrelieved bone/joint pain NEUROLOGIC: No headache, dizziness  PSYCHIATRIC: No overt anxiety or sadness  Vitals:   05/11/18 1351  BP: (!) 164/98  Pulse: (!) 53  Resp: 16  Temp: 97.6 F (36.4 C)  SpO2: 96%   Body mass index is 27.93 kg/m. Physical Exam  GENERAL APPEARANCE: Alert, conversant, No acute distress,continues to be pleasant    SKIN: No diaphoresis rash HEENT: Unremarkable RESPIRATORY: Breathing is even, unlabored. Lung sounds are clear   CARDIOVASCULAR: Heart RRR no murmurs, rubs or gallops. No peripheral edema  GASTROINTESTINAL: Abdomen is soft, non-tender, not distended w/ normal bowel sounds.  GENITOURINARY: Bladder non tender, not distended  MUSCULOSKELETAL: No abnormal joints or musculature NEUROLOGIC: Cranial nerves 2-12 grossly intact. Moves all extremities PSYCHIATRIC: Mood and affect appropriate to situation with some dementia, no behavioral issues  Patient Active Problem List   Diagnosis Date Noted  . 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 Insomnia 12/19/2017  . Urinary tract infection due to extended-spectrum beta lactamase (ESBL) producing Escherichia coli 11/17/2017  . Dysphagia, oropharyngeal 11/15/2017  . Altered mental status 11/10/2017  . Acute metabolic encephalopathy 46/96/2952  . Mania (  [place]) 06/21/2017  . Hypomagnesemia 06/19/2017  . Essential hypertension 06/18/2017  . Acute encephalopathy 06/17/2017  . Acute lower UTI 06/17/2017  . E. coli UTI 06/17/2017  . Depression 04/24/2017  . Hypothyroidism 10/03/2016  . Paranoia ([place]) 10/03/2016  . LOC (loss of consciousness) ([place]) 09/02/2016  . 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 Dementia with behavioral disturbance 09/02/2016  . Asthma with acute exacerbation 08/21/2016  . Atrial fibrillation with RVR ([place]) 08/21/2016  . Elevated INR 08/21/2016  . Chronic systolic heart failure ([place]) 08/21/2016  . Hyperlipidemia 08/21/2016  . Electrolyte abnormality 08/21/2016  . HCAP (healthcare-associated pneumonia) 08/10/2016  . Sepsis ([place]) 08/09/2016  . Coronary atherosclerosis of native coronary artery 01/21/2014  . Obesity 01/17/2014  . Abdominal pain, generalized 10/18/2012  . GERD (gastroesophageal reflux disease)   . Mood disorder ([place])   . Anxiety   . 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[place])   . Stroke  ([place])   . Chest pain 01/07/2012  . Asthma 01/07/2012  . A-fib ([place]) 01/07/2012  . 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 Anemia 01/07/2012  . ANEMIA 07/29/2010  . BARRETTS ESOPHAGUS 06/04/2010    CMP     Component Value Date/Time   NA 140 04/29/2018 0938   NA 142 12/21/2017   K 4.7 04/29/2018 0938   CL 103 04/29/2018 0938   CO2 29 04/29/2018 0938   GLUCOSE 102 (H) 04/29/2018 0938   BUN 9 04/29/2018 0938   BUN 19 12/21/2017   CREATININE 1.00 04/29/2018 0938   CALCIUM 9.6 04/29/2018 0938   PROT 6.9 11/08/2017 1512   ALBUMIN 3.8 11/08/2017 1512   AST 18 11/08/2017 1512   ALT 20 11/08/2017 1512   ALKPHOS 64 11/08/2017 1512   BILITOT 0.9 11/08/2017 1512   GFRNONAA 53 (L) 04/29/2018 0938   GFRAA >60 04/29/2018 0938   Recent Labs    06/18/17 0346 06/19/17 0431 11/08/17 1512 11/09/17 0822 11/15/17 12/21/17 04/29/18 0938  NA 140 138 141 143 137 142 140  K 3.7 3.5 3.7 3.6 3.9 4.2 4.7  CL 107 107 105 112*  --   --  103  CO2 24 24 26 23   --   --  29  GLUCOSE 100* 102* 150* 109*  --   --  102*  BUN 9 10 20 16 11 19 9   CREATININE 0.94 0.94 0.94 0.86 0.8 1.0 1.00  CALCIUM 8.9 8.9 9.9 9.5  --   --  9.6  MG 1.7 1.6*  --   --   --   --   --   PHOS 3.4 3.0  --   --   --   --   --    Recent Labs    06/18/17 0346 06/19/17 0431 11/08/17 1512  AST 14* 16 18  ALT 10* 14 20  ALKPHOS 52 53 64  BILITOT 0.5 0.7 0.9  PROT 5.6* 5.7* 6.9  ALBUMIN 3.1* 3.1* 3.8   Recent Labs    06/19/17 0431 11/08/17 1512 11/09/17 0822 11/15/17 12/21/17 04/29/18 0938  WBC 6.5 7.0 6.4 9.3 5.0 6.6  NEUTROABS 4.2 5.4  --   --   --  5.0  HGB 12.2 13.8 12.8 13.6 11.7* 13.4  HCT 37.3 42.8 40.0 41 37 42.8  MCV 84.8 87.2 87.9  --   --  84.9  PLT 147* 233 209 193 162 150   Recent Labs    06/19/17 0433  CHOL 109  LDLCALC 37  TRIG 68   No results found for: [facility] Lab Results  Component Value Date   TSH 1.680 11/08/2017   Lab Results  Component Value Date   HGBA1C 5.4  04/08/2017   Lab Results  Component Value Date   CHOL 109 06/19/2017   HDL 58 06/19/2017   LDLCALC 37 06/19/2017   TRIG 68 06/19/2017   CHOLHDL 1.9 06/19/2017    Significant Diagnostic Results in last 30 days:  No results found.  Assessment and Plan  A-fib ([place]) Stable; continue Coreg 3.125 twice daily and Xarelto 15 mg daily as prophylaxis  GERD (gastroesophageal reflux disease) No complaints of reflux or reports of reflux; continue Protonix 40 mg daily  Coronary atherosclerosis of native coronary artery No complaints of chest pain or equivalent; continue Coreg 3.125 mg twice daily, Imdur 30 mg p.o. daily and Xarelto 15 mg daily; patient is on statin     [name], MD

## 2018-05-20 ENCOUNTER — Encounter: Payer: Self-pay | Admitting: Internal Medicine

## 2018-05-20 NOTE — Assessment & Plan Note (Signed)
Patient continues to do well; continue trazodone 150 mg nightly and Zoloft 50 mg nightly

## 2018-05-20 NOTE — Assessment & Plan Note (Signed)
Patient has been doing extremely well, has been very pleasant; continue Seroquel 100 mg every morning and 75 mg nightly

## 2018-05-27 ENCOUNTER — Encounter: Payer: Self-pay | Admitting: Internal Medicine

## 2018-06-03 ENCOUNTER — Encounter: Payer: Self-pay | Admitting: Internal Medicine

## 2018-06-03 NOTE — Assessment & Plan Note (Signed)
No complaints of chest pain or equivalent; continue Coreg 3.125 mg twice daily, Imdur 30 mg p.o. daily and Xarelto 15 mg daily; patient is on statin

## 2018-06-03 NOTE — Assessment & Plan Note (Signed)
Stable; continue Coreg 3.125 twice daily and Xarelto 15 mg daily as prophylaxis

## 2018-06-03 NOTE — Assessment & Plan Note (Signed)
No complaints of reflux or reports of reflux; continue Protonix 40 mg daily

## 2018-06-08 ENCOUNTER — Encounter: Payer: Self-pay | Admitting: Internal Medicine

## 2018-06-08 ENCOUNTER — Non-Acute Institutional Stay (SKILLED_NURSING_FACILITY): Payer: Medicare Other | Admitting: Internal Medicine

## 2018-06-08 DIAGNOSIS — I5022 Chronic systolic (congestive) heart failure: Secondary | ICD-10-CM

## 2018-06-08 DIAGNOSIS — G301 Alzheimer's disease with late onset: Secondary | ICD-10-CM

## 2018-06-08 DIAGNOSIS — F39 Unspecified mood [affective] disorder: Secondary | ICD-10-CM | POA: Diagnosis not present

## 2018-06-08 DIAGNOSIS — I110 Hypertensive heart disease with heart failure: Secondary | ICD-10-CM | POA: Diagnosis not present

## 2018-06-08 DIAGNOSIS — F0281 Dementia in other diseases classified elsewhere with behavioral disturbance: Secondary | ICD-10-CM

## 2018-06-08 DIAGNOSIS — F02818 Dementia in other diseases classified elsewhere, unspecified severity, with other behavioral disturbance: Secondary | ICD-10-CM

## 2018-06-08 NOTE — Progress Notes (Signed)
Location:  [facility] Room Number: [phone] Place of Service:  SNF [phone])  Noah Delaine. [name], MD  Patient Care Team: [name], MD as PCP - General (Internal Medicine)  Extended Emergency Contact Information Primary Emergency Contact: Tyner,[name] Address: [address] [place], [place] [address] [name] Work Phone: [phone] Mobile Phone: [phone] Relation: Daughter Secondary Emergency Contact: Nyeli, [place] Mobile Phone: [phone] Relation: Son    Allergies: Patient has no known allergies.  Chief Complaint  Patient presents with  . Medical Management of Chronic Issues    Routine Visit    HPI: Patient is 77 y.o. female who is being seen for routine issues of mood disorder, hypertension, and dementia.  Past Medical History:  Diagnosis Date  . A-fib ([place]) 01/07/2012  . ANEMIA 07/29/2010   Qualifier: Diagnosis of  By: Bobby Rumpf CMA (AAMA), [place]    . Angina pectoris ([place])   . Anxiety   . Asthma   . Asthma 01/07/2012  . Barrett's esophagus   . Bronchitis   . 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 Chest pain   . Coronary artery disease    right carotid occlusion   . Dementia with behavioral disturbance 09/02/2016  . Depression   . Dysrhythmia    atrial fibrillation  . GERD (gastroesophageal reflux disease)   . 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 Hyperlipidemia   . Hypertension   . Mood disorder ([place])   . Paranoia ([place]) 10/03/2016  . Recurrent upper respiratory infection (URI)   . Stroke [facility])    2007, after left endarterectomy    Past Surgical History:  Procedure Laterality Date  . ABDOMINAL HYSTERECTOMY    . BALLOON DILATION  10/18/2012   Procedure: BALLOON DILATION;  Surgeon: Lear Ng, MD;  [facility];  Service: Endoscopy;  Laterality: N/A;  . CARDIAC CATHETERIZATION    . CAROTID ENDARTERECTOMY     left, complicated by stroke  . CHOLECYSTECTOMY     incidental at time of  colectomy  . COLONOSCOPY  12/01/2011   Procedure: COLONOSCOPY;  Surgeon: Lear Ng, MD;  [facility];  Service: Endoscopy;  Laterality: N/A;  . COLONOSCOPY  10/18/2012   Procedure: COLONOSCOPY;  Surgeon: Lear Ng, MD;  [facility];  Service: Endoscopy;  Laterality: N/A;  colonic dilation  . ESOPHAGOGASTRODUODENOSCOPY  12/01/2011   Procedure: ESOPHAGOGASTRODUODENOSCOPY (EGD);  Surgeon: Lear Ng, MD;  [facility];  Service: Endoscopy;  Laterality: N/A;  . HOT HEMOSTASIS  12/01/2011   Procedure: HOT HEMOSTASIS (ARGON PLASMA COAGULATION/BICAP);  Surgeon: Lear Ng, MD;  [facility];  Service: Endoscopy;  Laterality: N/A;  . 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 Always use your most recent med list.          alendronate 70 MG tablet Commonly known as:  FOSAMAX Take 70 mg by mouth every Wednesday. Take with a full glass of water on an empty stomach.   budesonide-formoterol 160-4.5 MCG/ACT inhaler Commonly known as:  SYMBICORT Inhale 2 puffs into the lungs 2 (two) times daily.   carvedilol 3.125 MG tablet Commonly known as:  COREG Take 1 tablet (3.125  mg total) by mouth 2 (two) times daily with a meal.   fluticasone 50 MCG/ACT nasal spray Commonly known as:  FLONASE Place 1 spray into both nostrils daily.   isosorbide mononitrate 30 MG 24 hr tablet Commonly known as:  IMDUR Take 30 mg by mouth daily.   levothyroxine 25 MCG tablet Commonly known as:  SYNTHROID, LEVOTHROID Take 25 mcg by mouth daily before breakfast.   mirtazapine 15 MG tablet Commonly known as:  REMERON Take 7.5 mg by mouth at bedtime. 1/2 TAB   PROTONIX 40 mg/20 mL Pack Generic drug:  pantoprazole sodium Mix 1 packet on 1 teaspoonful of applesauce or 5 mL and take daily,  DO NOT ADMINISTER WITH ANY OTHER LIQUIDS OR FOODS   QUEtiapine 100 MG tablet Commonly known as:  SEROQUEL Take 100 mg by mouth every morning.   QUEtiapine 25 MG tablet Commonly known as:  SEROQUEL Take 75 mg by mouth at bedtime.   rivaroxaban 10 MG Tabs tablet Commonly known as:  XARELTO Take 15 mg by mouth daily. 1 and 1/2 tablet   sertraline 50 MG tablet Commonly known as:  ZOLOFT Take 50 mg by mouth daily.   simvastatin 10 MG tablet Commonly known as:  ZOCOR Take 10 mg by mouth at bedtime.   tiotropium 18 MCG inhalation capsule Commonly known as:  SPIRIVA Place 18 mcg into inhaler and inhale daily.   traZODone 150 MG tablet Commonly known as:  DESYREL Take 150 mg by mouth at bedtime.       No orders of the defined types were placed in this encounter.   Immunization History  Administered Date(s) Administered  . Influenza-Unspecified 08/26/2016, 10/21/2017  . PPD Test 08/16/2016  . Pneumococcal Polysaccharide-23 01/08/2012    Social History   Tobacco Use  . Smoking status: Former Smoker    Last attempt to quit: 04/09/2003    Years since quitting: 15.2  . Smokeless tobacco: Never Used  Substance Use Topics  . Alcohol use: No    Review of Systems  DATA OBTAINED: from patient, nurse, medical record, family member GENERAL:  no fevers, fatigue, appetite changes SKIN: No itching, rash HEENT: No complaint RESPITORY: No cough, wheezing, SOB CARDIAC: No chest pain, palpitations, lower extremity edema  GI: No abdominal pain, No N/V/D or constipation, No heartburn or reflux  GU: No dysuria, frequency or urgency, or incontinence  MUSCULOSKELETAL: No unrelieved bone/joint pain NEUROLOGIC: No headache, dizziness  PSYCHIATRIC: No overt anxiety or sadness  Vitals:   06/08/18 1145  BP: 130/74  Pulse: 74  Resp: 18  Temp: 98 F (36.7 C)  SpO2: 96%   Body mass index is 27.93 kg/m. Physical Exam  GENERAL APPEARANCE: Alert, conversant, No acute distress    SKIN: No diaphoresis rash HEENT: Unremarkable RESPIRATORY: Breathing is even, unlabored. Lung sounds are clear   CARDIOVASCULAR: Heart RRR no murmurs, rubs or gallops. No peripheral edema  GASTROINTESTINAL: Abdomen is soft, non-tender, not distended w/ normal bowel sounds.  GENITOURINARY: Bladder non tender, not distended  MUSCULOSKELETAL: No abnormal joints or musculature NEUROLOGIC: Cranial nerves 2-12 grossly intact. Moves all extremities PSYCHIATRIC: Mood and affect appropriate to situation with dementia, no behavioral issues  Patient Active Problem List   Diagnosis Date Noted  . 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 Insomnia 12/19/2017  . Urinary tract infection due to extended-spectrum beta lactamase (ESBL) producing Escherichia coli 11/17/2017  . Dysphagia, oropharyngeal 11/15/2017  . Altered mental status 11/10/2017  . Acute metabolic encephalopathy 17/61/6073  . Mania ([place]) 06/21/2017  .  Hypomagnesemia 06/19/2017  . Essential hypertension 06/18/2017  . Acute encephalopathy 06/17/2017  . Acute lower UTI 06/17/2017  . E. coli UTI 06/17/2017  . Depression 04/24/2017  . Hypothyroidism 10/03/2016  . Paranoia ([place]) 10/03/2016  . LOC (loss of consciousness) ([place]) 09/02/2016  . 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 Dementia with behavioral disturbance 09/02/2016  . Asthma with acute exacerbation 08/21/2016  . Atrial fibrillation with RVR ([place]) 08/21/2016  . Elevated INR 08/21/2016  . Chronic systolic heart failure ([place]) 08/21/2016  . Hyperlipidemia 08/21/2016  . Electrolyte abnormality 08/21/2016  . HCAP (healthcare-associated pneumonia) 08/10/2016  . Sepsis ([place]) 08/09/2016  . Coronary atherosclerosis of native coronary artery 01/21/2014  . Obesity 01/17/2014  . Abdominal pain, generalized 10/18/2012  . GERD (gastroesophageal reflux disease)   . Mood disorder ([place])   . Anxiety   . 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[place])   . Stroke ([place])    . Chest pain 01/07/2012  . Asthma 01/07/2012  . A-fib ([place]) 01/07/2012  . 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 Anemia 01/07/2012  . ANEMIA 07/29/2010  . BARRETTS ESOPHAGUS 06/04/2010    CMP     Component Value Date/Time   NA 140 04/29/2018 0938   NA 142 12/21/2017   K 4.7 04/29/2018 0938   CL 103 04/29/2018 0938   CO2 29 04/29/2018 0938   GLUCOSE 102 (H) 04/29/2018 0938   BUN 9 04/29/2018 0938   BUN 19 12/21/2017   CREATININE 1.00 04/29/2018 0938   CALCIUM 9.6 04/29/2018 0938   PROT 6.9 11/08/2017 1512   ALBUMIN 3.8 11/08/2017 1512   AST 18 11/08/2017 1512   ALT 20 11/08/2017 1512   ALKPHOS 64 11/08/2017 1512   BILITOT 0.9 11/08/2017 1512   GFRNONAA 53 (L) 04/29/2018 0938   GFRAA >60 04/29/2018 0938   Recent Labs    06/18/17 0346 06/19/17 0431 11/08/17 1512 11/09/17 0822 11/15/17 12/21/17 04/29/18 0938  NA 140 138 141 143 137 142 140  K 3.7 3.5 3.7 3.6 3.9 4.2 4.7  CL 107 107 105 112*  --   --  103  CO2 24 24 26 23   --   --  29  GLUCOSE 100* 102* 150* 109*  --   --  102*  BUN 9 10 20 16 11 19 9   CREATININE 0.94 0.94 0.94 0.86 0.8 1.0 1.00  CALCIUM 8.9 8.9 9.9 9.5  --   --  9.6  MG 1.7 1.6*  --   --   --   --   --   PHOS 3.4 3.0  --   --   --   --   --    Recent Labs    06/18/17 0346 06/19/17 0431 11/08/17 1512  AST 14* 16 18  ALT 10* 14 20  ALKPHOS 52 53 64  BILITOT 0.5 0.7 0.9  PROT 5.6* 5.7* 6.9  ALBUMIN 3.1* 3.1* 3.8   Recent Labs    06/19/17 0431 11/08/17 1512 11/09/17 0822 11/15/17 12/21/17 04/29/18 0938  WBC 6.5 7.0 6.4 9.3 5.0 6.6  NEUTROABS 4.2 5.4  --   --   --  5.0  HGB 12.2 13.8 12.8 13.6 11.7* 13.4  HCT 37.3 42.8 40.0 41 37 42.8  MCV 84.8 87.2 87.9  --   --  84.9  PLT 147* 233 209 193 162 150   Recent Labs    06/19/17 0433  CHOL 109  LDLCALC 37  TRIG 68  No results found for: [facility] Lab Results  Component Value Date   TSH 1.680 11/08/2017   Lab Results  Component Value Date   HGBA1C 5.4 04/08/2017    Lab Results  Component Value Date   CHOL 109 06/19/2017   HDL 58 06/19/2017   LDLCALC 37 06/19/2017   TRIG 68 06/19/2017   CHOLHDL 1.9 06/19/2017    Significant Diagnostic Results in last 30 days:  No results found.  Assessment and Plan  Mood disorder ([place]) Really good control on Seroquel 100 mg every morning and 75 mg nightly  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HCC) Controlled on Imdur 30 mg daily and Coreg 3.125 mg twice daily  Dementia with behavioral disturbance Stable; not progressive; continue supportive care     Anne D. [name], MD

## 2018-06-17 ENCOUNTER — Encounter: Payer: Self-pay | Admitting: Internal Medicine

## 2018-06-17 NOTE — Assessment & Plan Note (Signed)
Controlled on Imdur 30 mg daily and Coreg 3.125 mg twice daily

## 2018-06-17 NOTE — Assessment & Plan Note (Signed)
Really good control on Seroquel 100 mg every morning and 75 mg nightly

## 2018-06-17 NOTE — Assessment & Plan Note (Signed)
Stable; not progressive; continue supportive care

## 2018-07-13 ENCOUNTER — Non-Acute Institutional Stay (SKILLED_NURSING_FACILITY): Payer: Medicare Other

## 2018-07-13 DIAGNOSIS — Z0000 Encounter for general adult medical examination without abnormal findings: Secondary | ICD-10-CM

## 2018-07-13 NOTE — Patient Instructions (Signed)
Cindy [name] , Thank you for taking time to come for your Medicare Wellness Visit. I appreciate your ongoing commitment to your health goals. Please review the following plan we discussed and let me know if I can assist you in the future.   Screening recommendations/referrals: Colonoscopy excluded, over age 79 Mammogram excluded, over age 15 Bone Density excluded Recommended yearly ophthalmology/optometry visit for glaucoma screening and checkup Recommended yearly dental visit for hygiene and checkup  Vaccinations: Influenza vaccine due 2019 fall season Pneumococcal vaccine 13 due, ordered Tdap vaccine due, ordered Shingles vaccine not in past records    Advanced directives: in chart  Conditions/risks identified: none  Next appointment: Dr. [name] makes rounds   Preventive Care 65 Years and Older, Female Preventive care refers to lifestyle choices and visits with your health care provider that can promote health and wellness. What does preventive care include?  A yearly physical exam. This is also called an annual well check.  Dental exams once or twice a year.  Routine eye exams. Ask your health care provider how often you should have your eyes checked.  Personal lifestyle choices, including:  Daily care of your teeth and gums.  Regular physical activity.  Eating a healthy diet.  Avoiding tobacco and drug use.  Limiting alcohol use.  Practicing safe sex.  Taking low-dose aspirin every day.  Taking vitamin and mineral supplements as recommended by your health care provider. What happens during an annual well check? The services and screenings done by your health care provider during your annual well check will depend on your age, overall health, lifestyle risk factors, and family history of disease. Counseling  Your health care provider may ask you questions about your:  Alcohol use.  Tobacco use.  Drug use.  Emotional well-being.  Home and relationship  well-being.  Sexual activity.  Eating habits.  History of falls.  Memory and ability to understand (cognition).  Work and work Statistician.  Reproductive health. Screening  You may have the following tests or measurements:  Height, weight, and BMI.  Blood pressure.  Lipid and cholesterol levels. These may be checked every 5 years, or more frequently if you are over 1 years old.  Skin check.  Lung cancer screening. You may have this screening every year starting at age 75 if you have a 30-pack-year history of smoking and currently smoke or have quit within the past 15 years.  Fecal occult blood test (FOBT) of the stool. You may have this test every year starting at age 59.  Flexible sigmoidoscopy or colonoscopy. You may have a sigmoidoscopy every 5 years or a colonoscopy every 10 years starting at age 11.  Hepatitis C blood test.  Hepatitis B blood test.  Sexually transmitted disease (STD) testing.  Diabetes screening. This is done by checking your blood sugar (glucose) after you have not eaten for a while (fasting). You may have this done every 1-3 years.  Bone density scan. This is done to screen for osteoporosis. You may have this done starting at age 25.  Mammogram. This may be done every 1-2 years. Talk to your health care provider about how often you should have regular mammograms. Talk with your health care provider about your test results, treatment options, and if necessary, the need for more tests. Vaccines  Your health care provider may recommend certain vaccines, such as:  Influenza vaccine. This is recommended every year.  Tetanus, diphtheria, and acellular pertussis (Tdap, Td) vaccine. You may need a Td booster every  10 years.  Zoster vaccine. You may need this after age 76.  Pneumococcal 13-valent conjugate (PCV13) vaccine. One dose is recommended after age 82.  Pneumococcal polysaccharide (PPSV23) vaccine. One dose is recommended after age  87. Talk to your health care provider about which screenings and vaccines you need and how often you need them. This information is not intended to replace advice given to you by your health care provider. Make sure you discuss any questions you have with your health care provider. Document Released: 12/12/2015 Document Revised: 08/04/2016 Document Reviewed: 09/16/2015 Elsevier Interactive Patient Education  2017 [place] Prevention in the Home Falls can cause injuries. They can happen to people of all ages. There are many things you can do to make your home safe and to help prevent falls. What can I do on the outside of my home?  Regularly fix the edges of walkways and driveways and fix any cracks.  Remove anything that might make you trip as you walk through a door, such as a raised step or threshold.  Trim any bushes or trees on the path to your home.  Use bright outdoor lighting.  Clear any walking paths of anything that might make someone trip, such as rocks or tools.  Regularly check to see if handrails are loose or broken. Make sure that both sides of any steps have handrails.  Any raised decks and porches should have guardrails on the edges.  Have any leaves, snow, or ice cleared regularly.  Use sand or salt on walking paths during winter.  Clean up any spills in your garage right away. This includes oil or grease spills. What can I do in the bathroom?  Use night lights.  Install grab bars by the toilet and in the tub and shower. Do not use towel bars as grab bars.  Use non-skid mats or decals in the tub or shower.  If you need to sit down in the shower, use a plastic, non-slip stool.  Keep the floor dry. Clean up any water that spills on the floor as soon as it happens.  Remove soap buildup in the tub or shower regularly.  Attach bath mats securely with double-sided non-slip rug tape.  Do not have throw rugs and other things on the floor that can make  you trip. What can I do in the bedroom?  Use night lights.  Make sure that you have a light by your bed that is easy to reach.  Do not use any sheets or blankets that are too big for your bed. They should not hang down onto the floor.  Have a firm chair that has side arms. You can use this for support while you get dressed.  Do not have throw rugs and other things on the floor that can make you trip. What can I do in the kitchen?  Clean up any spills right away.  Avoid walking on wet floors.  Keep items that you use a lot in easy-to-reach places.  If you need to reach something above you, use a strong step stool that has a grab bar.  Keep electrical cords out of the way.  Do not use floor polish or wax that makes floors slippery. If you must use wax, use non-skid floor wax.  Do not have throw rugs and other things on the floor that can make you trip. What can I do with my stairs?  Do not leave any items on the stairs.  Make sure  that there are handrails on both sides of the stairs and use them. Fix handrails that are broken or loose. Make sure that handrails are as long as the stairways.  Check any carpeting to make sure that it is firmly attached to the stairs. Fix any carpet that is loose or worn.  Avoid having throw rugs at the top or bottom of the stairs. If you do have throw rugs, attach them to the floor with carpet tape.  Make sure that you have a light switch at the top of the stairs and the bottom of the stairs. If you do not have them, ask someone to add them for you. What else can I do to help prevent falls?  Wear shoes that:  Do not have high heels.  Have rubber bottoms.  Are comfortable and fit you well.  Are closed at the toe. Do not wear sandals.  If you use a stepladder:  Make sure that it is fully opened. Do not climb a closed stepladder.  Make sure that both sides of the stepladder are locked into place.  Ask someone to hold it for you, if  possible.  Clearly mark and make sure that you can see:  Any grab bars or handrails.  First and last steps.  Where the edge of each step is.  Use tools that help you move around (mobility aids) if they are needed. These include:  Canes.  Walkers.  Scooters.  Crutches.  Turn on the lights when you go into a dark area. Replace any light bulbs as soon as they burn out.  Set up your furniture so you have a clear path. Avoid moving your furniture around.  If any of your floors are uneven, fix them.  If there are any pets around you, be aware of where they are.  Review your medicines with your doctor. Some medicines can make you feel dizzy. This can increase your chance of falling. Ask your doctor what other things that you can do to help prevent falls. This information is not intended to replace advice given to you by your health care provider. Make sure you discuss any questions you have with your health care provider. Document Released: 09/11/2009 Document Revised: 04/22/2016 Document Reviewed: 12/20/2014 Elsevier Interactive Patient Education  2017 [employer].

## 2018-07-13 NOTE — Progress Notes (Signed)
Subjective:   Cindy [name] is a 77 y.o. female who presents for Medicare Annual (Subsequent) preventive examination at [facility]  Last AWV-07/07/2017    Objective:     Vitals: BP 128/73 (BP Location: Left Arm, Patient Position: Supine)   Pulse 72   Temp 98 F (36.7 C) (Oral)   Ht 5' (1.524 m)   Wt 143 lb (64.9 kg)   BMI 27.93 kg/m   Body mass index is 27.93 kg/m.  Advanced Directives 07/13/2018 05/01/2018 04/29/2018 03/23/2018 01/10/2018 11/14/2017 11/08/2017  Does Patient Have a Medical Advance Directive? Yes Yes Yes Yes Yes Yes Yes  Type of Advance Directive Out of facility DNR (pink MOST or yellow form) Out of facility DNR (pink MOST or yellow form) Out of facility DNR (pink MOST or yellow form) Out of facility DNR (pink MOST or yellow form) Out of facility DNR (pink MOST or yellow form) Out of facility DNR (pink MOST or yellow form) Healthcare Power of Attorney  Does patient want to make changes to medical advance directive? No - Patient declined No - Patient declined Yes (ED - Information included in AVS) No - Patient declined No - Patient declined - -  Copy of [place] in Chart? - - - - - - -  Would patient like information on creating a medical advance directive? - - - - - - -  Pre-existing out of facility DNR order (yellow form or pink MOST form) Yellow form placed in chart (order not valid for inpatient use) Yellow form placed in chart (order not valid for inpatient use) Yellow form placed in chart (order not valid for inpatient use) Yellow form placed in chart (order not valid for inpatient use) Yellow form placed in chart (order not valid for inpatient use) Yellow form placed in chart (order not valid for inpatient use) -    Tobacco Social History   Tobacco Use  Smoking Status Former Smoker  . Last attempt to quit: 04/09/2003  . Years since quitting: 15.2  Smokeless Tobacco Never Used     Counseling given: Not Answered   Clinical  Intake:  Pre-visit preparation completed: No  Pain : No/denies pain     Nutritional Risks: None Diabetes: No  What is the last grade level you completed in school?: 9th grade  Interpreter Needed?: No  Information entered by :: Tyson Dense, RN  Past Medical History:  Diagnosis Date  . A-fib ([place]) 01/07/2012  . ANEMIA 07/29/2010   Qualifier: Diagnosis of  By: Bobby Rumpf CMA (AAMA), [place]    . Angina pectoris ([place])   . Anxiety   . Asthma   . Asthma 01/07/2012  . Barrett's esophagus   . Bronchitis   . 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 Chest pain   . Coronary artery disease    right carotid occlusion   . Dementia with behavioral disturbance 09/02/2016  . Depression   . Dysrhythmia    atrial fibrillation  . GERD (gastroesophageal reflux disease)   . 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 Hyperlipidemia   . Hypertension   . Mood disorder ([place])   . Paranoia ([place]) 10/03/2016  . Recurrent upper respiratory infection (URI)   . Stroke [facility])    2007, after left endarterectomy   Past Surgical History:  Procedure Laterality Date  . ABDOMINAL HYSTERECTOMY    . BALLOON DILATION  10/18/2012   Procedure: BALLOON DILATION;  Surgeon: Lear Ng, MD;  Location: [facility] ENDOSCOPY;  Service: Endoscopy;  Laterality: N/A;  . CARDIAC CATHETERIZATION    . CAROTID ENDARTERECTOMY     left, complicated by stroke  . CHOLECYSTECTOMY     incidental at time of colectomy  . COLONOSCOPY  12/01/2011   Procedure: COLONOSCOPY;  Surgeon: Lear Ng, MD;  Location: [facility] ENDOSCOPY;  Service: Endoscopy;  Laterality: N/A;  . COLONOSCOPY  10/18/2012   Procedure: COLONOSCOPY;  Surgeon: Lear Ng, MD;  Location: [facility] ENDOSCOPY;  Service: Endoscopy;  Laterality: N/A;  colonic dilation  . ESOPHAGOGASTRODUODENOSCOPY  12/01/2011   Procedure: ESOPHAGOGASTRODUODENOSCOPY (EGD);  Surgeon: Lear Ng, MD;  Location: [facility];  Service: Endoscopy;  Laterality: N/A;  . HOT HEMOSTASIS   12/01/2011   Procedure: HOT HEMOSTASIS (ARGON PLASMA COAGULATION/BICAP);  Surgeon: Lear Ng, MD;  Location: [facility];  Service: Endoscopy;  Laterality: N/A;  . 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 Heart disease Mother   . Cancer Mother   . Heart attack Father   . Cancer Sister        ovarian   Social History   Socioeconomic History  . Marital status: Divorced    Spouse name: Not on file  . Number of children: Not on file  . Years of education: Not on file  . Highest education level: Not on file  Occupational History  . Occupation: retired Tour manager  . Financial resource strain: Not hard at all  . Food insecurity:    Worry: Never true    Inability: Never true  . Transportation needs:    Medical: No    Non-medical: No  Tobacco Use  . Smoking status: Former Smoker    Last attempt to quit: 04/09/2003    Years since quitting: 15.2  . Smokeless tobacco: Never Used  Substance and Sexual Activity  . Alcohol use: No  . Drug use: No  . Sexual activity: Never  Lifestyle  . Physical activity:    Days per week: 0 days    Minutes per session: 0 min  . Stress: Not at all  Relationships  . Social connections:    Talks on phone: Once a week    Gets together: Once a week    Attends religious service: Never    Active member of club or organization: No    Attends meetings of clubs or organizations: Never    Relationship status: Divorced  Other Topics Concern  . Not on file  Social History Narrative   Admitted to [facility] 08/16/16   Divorced   Former smoker-stopped 2004   Alcohol none   DNR    Outpatient Encounter Medications as of 07/13/2018  Medication Sig  . alendronate (FOSAMAX) 70 MG tablet Take 70 mg by mouth every Wednesday. Take with a full glass of water on an empty stomach.  . budesonide-formoterol (SYMBICORT) 160-4.5 MCG/ACT inhaler Inhale 2  puffs into the lungs 2 (two) times daily.  . carvedilol (COREG) 3.125 MG tablet Take 1 tablet (3.125 mg total) by mouth 2 (two) times daily with a meal.  . fluticasone (FLONASE) 50 MCG/ACT nasal spray Place 1 spray into both nostrils daily.   . isosorbide mononitrate (IMDUR) 30 MG 24 hr tablet Take 30 mg by mouth daily.  Marland Kitchen levothyroxine (SYNTHROID, LEVOTHROID) 25 MCG tablet Take 25 mcg by mouth daily before breakfast.   . mirtazapine (REMERON) 15  MG tablet Take 7.5 mg by mouth at bedtime. 1/2 TAB  . pantoprazole sodium (PROTONIX) 40 mg/20 mL PACK Mix 1 packet on 1 teaspoonful of applesauce or 5 mL and take daily, DO NOT ADMINISTER WITH ANY OTHER LIQUIDS OR FOODS  . QUEtiapine (SEROQUEL) 100 MG tablet Take 100 mg by mouth every morning.  Marland Kitchen QUEtiapine (SEROQUEL) 25 MG tablet Take 75 mg by mouth at bedtime.  . rivaroxaban (XARELTO) 10 MG TABS tablet Take 15 mg by mouth daily. 1 and 1/2 tablet  . sertraline (ZOLOFT) 50 MG tablet Take 50 mg by mouth daily.   . simvastatin (ZOCOR) 10 MG tablet Take 10 mg by mouth at bedtime.  Marland Kitchen tiotropium (SPIRIVA) 18 MCG inhalation capsule Place 18 mcg into inhaler and inhale daily.  . traZODone (DESYREL) 150 MG tablet Take 150 mg by mouth at bedtime.    No facility-administered encounter medications on file as of 07/13/2018.     Activities of Daily Living In your present state of health, do you have any difficulty performing the following activities: 07/13/2018 11/08/2017  Hearing? N Y  Vision? N Y  Difficulty concentrating or making decisions? Tempie Donning  Walking or climbing stairs? Y Y  Dressing or bathing? Y Y  Doing errands, shopping? Y -  Conservation officer, nature and eating ? Y -  Using the Toilet? Y -  In the past six months, have you accidently leaked urine? Y -  Do you have problems with loss of bowel control? Y -  Managing your Medications? Y -  Managing your Finances? Y -  Housekeeping or managing your Housekeeping? Y -  Some recent data might be hidden     Patient Care Team: [name], MD as PCP - General (Internal Medicine)    Assessment:   This is a routine wellness examination for [place].  Exercise Activities and Dietary recommendations Current Exercise Habits: The patient does not participate in regular exercise at present, Exercise limited by: orthopedic condition(s);neurologic condition(s)  Goals   None     Fall Risk Fall Risk  07/13/2018 07/07/2017  Falls in the past year? Yes No  Number falls in past yr: 1 -  Injury with Fall? No -   Is the patient's home free of loose throw rugs in walkways, pet beds, electrical cords, etc?   yes      Grab bars in the bathroom? yes      Handrails on the stairs?   yes      Adequate lighting?   yes  Timed Get Up and Go performed: patient is unambulatory  Depression Screen PHQ 2/9 Scores 07/13/2018 07/07/2017  PHQ - 2 Score 0 4  PHQ- 9 Score - 7     Cognitive Function     6CIT Screen 07/13/2018 07/07/2017  What Year? 0 points 0 points  What month? 3 points 0 points  What time? 3 points 3 points  Count back from 20 4 points 0 points  Months in reverse 0 points 4 points  Repeat phrase 10 points 10 points  Total Score 20 17    Immunization History  Administered Date(s) Administered  . Influenza-Unspecified 08/26/2016, 10/21/2017  . PPD Test 08/16/2016  . Pneumococcal Polysaccharide-23 01/08/2012    Qualifies for Shingles Vaccine? Not in past records  Screening Tests Health Maintenance  Topic Date Due  . PNA vac Low Risk Adult (2 of 2 - PCV13) 01/07/2013  . INFLUENZA VACCINE  06/29/2018  . TETANUS/TDAP  11/29/2018 (Originally 08/15/1960)  .  DEXA SCAN  05/12/2019 (Originally 08/15/2004)    Cancer Screenings: Lung: Low Dose CT Chest recommended if Age 33-80 years, 30 pack-year currently smoking OR have quit w/in 15years. Patient does not qualify. Breast:  Up to date on Mammogram? Yes   Up to date of Bone Density/Dexa? Yes Colorectal: excluded, patient is  unambulatory  Additional Screenings:  Hepatitis C Screening: declined Prevnar due: ordered TDAP due: ordered    Plan:    I have personally reviewed and addressed the Medicare Annual Wellness questionnaire and have noted the following in the patient's chart:  A. Medical and social history B. Use of alcohol, tobacco or illicit drugs  C. Current medications and supplements D. Functional ability and status E.  Nutritional status F.  Physical activity G. Advance directives H. List of other physicians I.  Hospitalizations, surgeries, and ER visits in previous 12 months J.  [place] to include hearing, vision, cognitive, depression L. Referrals and appointments - none  In addition, I have reviewed and discussed with patient certain preventive protocols, quality metrics, and best practice recommendations. A written personalized care plan for preventive services as well as general preventive health recommendations were provided to patient.  See attached scanned questionnaire for additional information.   Signed,   Tyson Dense, RN Nurse Health Advisor  Patient Concerns: None

## 2018-07-14 ENCOUNTER — Encounter: Payer: Self-pay | Admitting: Internal Medicine

## 2018-07-14 ENCOUNTER — Non-Acute Institutional Stay (SKILLED_NURSING_FACILITY): Payer: Medicare Other | Admitting: Internal Medicine

## 2018-07-14 DIAGNOSIS — E034 Atrophy of thyroid (acquired): Secondary | ICD-10-CM | POA: Diagnosis not present

## 2018-07-14 DIAGNOSIS — I10 Essential (primary) hypertension: Secondary | ICD-10-CM | POA: Diagnosis not present

## 2018-07-14 DIAGNOSIS — F5104 Psychophysiologic insomnia: Secondary | ICD-10-CM | POA: Diagnosis not present

## 2018-07-14 NOTE — Progress Notes (Signed)
Location:  [facility] Room Number: [phone] Place of Service:  SNF [phone])  Cindy [name]. [name], MD  Patient Care Team: [name], MD as PCP - General (Internal Medicine)  Extended Emergency Contact Information Primary Emergency Contact: Tyner,[name] Address: [address] [place], [place] [address] [name] Work Phone: [phone] Mobile Phone: [phone] Relation: Daughter Secondary Emergency Contact: Cleotha, Whalin Mobile Phone: [phone] Relation: Son    Allergies: Patient has no known allergies.  Chief Complaint  Patient presents with  . Medical Management of Chronic Issues    Routine Visit    HPI: Patient is 77 y.o. female who is being seen for routine issues of insomnia, hypertension, and hypothyroidism.  Past Medical History:  Diagnosis Date  . A-fib ([place]) 01/07/2012  . ANEMIA 07/29/2010   Qualifier: Diagnosis of  By: Bobby Rumpf CMA (AAMA), Pat[place]    . Angina pectoris ([place])   . Anxiety   . Asthma   . Asthma 01/07/2012  . Barrett's esophagus   . Bronchitis   . Cancer ([place])    uterian  . Chest pain   . Coronary artery disease    right carotid occlusion   . Dementia with behavioral disturbance 09/02/2016  . Depression   . Dysrhythmia    atrial fibrillation  . GERD (gastroesophageal reflux disease)   . 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 Hyperlipidemia   . Hypertension   . Mood disorder ([place])   . Paranoia ([place]) 10/03/2016  . Recurrent upper respiratory infection (URI)   . Stroke [facility])    2007, after left endarterectomy    Past Surgical History:  Procedure Laterality Date  . ABDOMINAL HYSTERECTOMY    . BALLOON DILATION  10/18/2012   Procedure: BALLOON DILATION;  Surgeon: Lear Ng, MD;  [facility] ENDOS[facility];  Service: Endoscopy;  Laterality: N/A;  . CARDIAC CATHETERIZATION    . CAROTID ENDARTERECTOMY     left, complicated by stroke  . CHOLECYSTECTOMY     incidental at time of  colectomy  . COLONOSCOPY  12/01/2011   Procedure: COLONOSCOPY;  Surgeon: Lear Ng, MD;  [facility];  Service: Endoscopy;  Laterality: N/A;  . COLONOSCOPY  10/18/2012   Procedure: COLONOSCOPY;  Surgeon: Lear Ng, MD;  [facility];  Service: Endoscopy;  Laterality: N/A;  colonic dilation  . ESOPHAGOGASTRODUODENOSCOPY  12/01/2011   Procedure: ESOPHAGOGASTRODUODENOSCOPY (EGD);  Surgeon: Lear Ng, MD;  Location: [facility];  Service: Endoscopy;  Laterality: N/A;  . HOT HEMOSTASIS  12/01/2011   Procedure: HOT HEMOSTASIS (ARGON PLASMA COAGULATION/BICAP);  Surgeon: Lear Ng, MD;  Location: [facility];  Service: Endoscopy;  Laterality: N/A;  . 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 Always use your most recent med list.          alendronate 70 MG tablet Commonly known as:  FOSAMAX Take 70 mg by mouth every Wednesday. Take with a full glass of water on an empty stomach.   budesonide-formoterol 160-4.5 MCG/ACT inhaler Commonly known as:  SYMBICORT Inhale 2 puffs into the lungs 2 (two) times daily.   carvedilol 3.125 MG tablet Commonly known as:  COREG Take 1 tablet (3.125 mg  total) by mouth 2 (two) times daily with a meal.   fluticasone 50 MCG/ACT nasal spray Commonly known as:  FLONASE Place 1 spray into both nostrils daily.   isosorbide mononitrate 30 MG 24 hr tablet Commonly known as:  IMDUR Take 30 mg by mouth daily.   levothyroxine 25 MCG tablet Commonly known as:  SYNTHROID, LEVOTHROID Take 25 mcg by mouth daily before breakfast.   mirtazapine 15 MG tablet Commonly known as:  REMERON Take 7.5 mg by mouth at bedtime. 1/2 TAB   PROTONIX 40 mg/20 mL Pack Generic drug:  pantoprazole sodium Mix 1 packet on 1 teaspoonful of applesauce or 5 mL and take daily,  DO NOT ADMINISTER WITH ANY OTHER LIQUIDS OR FOODS   QUEtiapine 100 MG tablet Commonly known as:  SEROQUEL Take 100 mg by mouth every morning.   QUEtiapine 25 MG tablet Commonly known as:  SEROQUEL Take 75 mg by mouth at bedtime.   rivaroxaban 10 MG Tabs tablet Commonly known as:  XARELTO Take 15 mg by mouth daily. 1 and 1/2 tablet   sertraline 50 MG tablet Commonly known as:  ZOLOFT Take 50 mg by mouth daily.   simvastatin 10 MG tablet Commonly known as:  ZOCOR Take 10 mg by mouth at bedtime.   tiotropium 18 MCG inhalation capsule Commonly known as:  SPIRIVA Place 18 mcg into inhaler and inhale daily.   traZODone 150 MG tablet Commonly known as:  DESYREL Take 150 mg by mouth at bedtime.       No orders of the defined types were placed in this encounter.   Immunization History  Administered Date(s) Administered  . Influenza-Unspecified 08/26/2016, 10/21/2017  . PPD Test 08/16/2016  . Pneumococcal Polysaccharide-23 01/08/2012    Social History   Tobacco Use  . Smoking status: Former Smoker    Last attempt to quit: 04/09/2003    Years since quitting: 15.2  . Smokeless tobacco: Never Used  Substance Use Topics  . Alcohol use: No    Review of Systems  DATA OBTAINED: from patient, nurse GENERAL:  no fevers, fatigue, appetite changes SKIN: No itching, rash HEENT: No complaint RESPIRATORY: No cough, wheezing, SOB CARDIAC: No chest pain, palpitations, lower extremity edema  GI: No abdominal pain, No N/V/D or constipation, No heartburn or reflux  GU: No dysuria, frequency or urgency, or incontinence  MUSCULOSKELETAL: No unrelieved bone/joint pain NEUROLOGIC: No headache, dizziness  PSYCHIATRIC: No overt anxiety or sadness  Vitals:   07/14/18 1434  BP: 124/63  Pulse: 84  Resp: 19  Temp: 98 F (36.7 C)   Body mass index is 28.98 kg/m. Physical Exam  GENERAL APPEARANCE: Alert, conversant, No acute distress  SKIN: No diaphoresis rash HEENT:  Unremarkable RESPIRATORY: Breathing is even, unlabored. Lung sounds are clear   CARDIOVASCULAR: Heart RRR no murmurs, rubs or gallops. No peripheral edema  GASTROINTESTINAL: Abdomen is soft, non-tender, not distended w/ normal bowel sounds.  GENITOURINARY: Bladder non tender, not distended  MUSCULOSKELETAL: No abnormal joints or musculature NEUROLOGIC: Cranial nerves 2-12 grossly intact. Moves all extremities PSYCHIATRIC: Mood and affect appropriate to situation, no behavioral issues  Patient Active Problem List   Diagnosis Date Noted  . Psychoses ([place]) 12/19/2017  . Insomnia 12/19/2017  . Urinary tract infection due to extended-spectrum beta lactamase (ESBL) producing Escherichia coli 11/17/2017  . Dysphagia, oropharyngeal 11/15/2017  . Altered mental status 11/10/2017  . Acute metabolic encephalopathy 32/99/2426  . Mania ([place]) 06/21/2017  . Hypomagnesemia 06/19/2017  . Essential hypertension 06/18/2017  .  Acute encephalopathy 06/17/2017  . Acute lower UTI 06/17/2017  . E. coli UTI 06/17/2017  . Depression 04/24/2017  . Hypothyroidism 10/03/2016  . Paranoia ([place]) 10/03/2016  . LOC (loss of consciousness) ([place]) 09/02/2016  . 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 Dementia with behavioral disturbance 09/02/2016  . Asthma with acute exacerbation 08/21/2016  . Atrial fibrillation with RVR ([place]) 08/21/2016  . Elevated INR 08/21/2016  . Chronic systolic heart failure ([place]) 08/21/2016  . Hyperlipidemia 08/21/2016  . Electrolyte abnormality 08/21/2016  . HCAP (healthcare-associated pneumonia) 08/10/2016  . Sepsis ([place]) 08/09/2016  . Coronary atherosclerosis of native coronary artery 01/21/2014  . Obesity 01/17/2014  . Abdominal pain, generalized 10/18/2012  . GERD (gastroesophageal reflux disease)   . Mood disorder ([place])   . Anxiety   . 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[place])   . Stroke ([place])   . Chest pain 01/07/2012  . Asthma 01/07/2012    . A-fib ([place]) 01/07/2012  . 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 Anemia 01/07/2012  . ANEMIA 07/29/2010  . BARRETTS ESOPHAGUS 06/04/2010    CMP     Component Value Date/Time   NA 140 04/29/2018 0938   NA 142 12/21/2017   K 4.7 04/29/2018 0938   CL 103 04/29/2018 0938   CO2 29 04/29/2018 0938   GLUCOSE 102 (H) 04/29/2018 0938   BUN 9 04/29/2018 0938   BUN 19 12/21/2017   CREATININE 1.00 04/29/2018 0938   CALCIUM 9.6 04/29/2018 0938   PROT 6.9 11/08/2017 1512   ALBUMIN 3.8 11/08/2017 1512   AST 18 11/08/2017 1512   ALT 20 11/08/2017 1512   ALKPHOS 64 11/08/2017 1512   BILITOT 0.9 11/08/2017 1512   GFRNONAA 53 (L) 04/29/2018 0938   GFRAA >60 04/29/2018 0938   Recent Labs    11/08/17 1512 11/09/17 0822 11/15/17 12/21/17 04/29/18 0938  NA 141 143 137 142 140  K 3.7 3.6 3.9 4.2 4.7  CL 105 112*  --   --  103  CO2 26 23  --   --  29  GLUCOSE 150* 109*  --   --  102*  BUN 20 16 11 19 9   CREATININE 0.94 0.86 0.8 1.0 1.00  CALCIUM 9.9 9.5  --   --  9.6   Recent Labs    11/08/17 1512  AST 18  ALT 20  ALKPHOS 64  BILITOT 0.9  PROT 6.9  ALBUMIN 3.8   Recent Labs    11/08/17 1512 11/09/17 0822 11/15/17 12/21/17 04/29/18 0938  WBC 7.0 6.4 9.3 5.0 6.6  NEUTROABS 5.4  --   --   --  5.0  HGB 13.8 12.8 13.6 11.7* 13.4  HCT 42.8 40.0 41 37 42.8  MCV 87.2 87.9  --   --  84.9  PLT 233 209 193 162 150   No results for input(s): CHOL, LDLCALC, TRIG in the last 8760 hours.  Invalid input(s): HCL No results found for: [facility] Lab Results  Component Value Date   TSH 1.680 11/08/2017   Lab Results  Component Value Date   HGBA1C 5.4 04/08/2017   Lab Results  Component Value Date   CHOL 109 06/19/2017   HDL 58 06/19/2017   LDLCALC 37 06/19/2017   TRIG 68 06/19/2017   CHOLHDL 1.9 06/19/2017    Significant Diagnostic Results in last 30 days:  No results found.  Assessment and Plan  Insomnia No complaints; treated with trazodone 150 mg  nightly  Essential hypertension Controlled; continue Coreg 3.125 mg twice daily and indoor 30 mg daily  Hypothyroidism TSH 1.68; continue Synthroid 25 mcg daily    Ananth Fiallos D. [name], MD

## 2018-07-22 ENCOUNTER — Encounter: Payer: Self-pay | Admitting: Internal Medicine

## 2018-07-22 NOTE — Assessment & Plan Note (Signed)
TSH 1.68; continue Synthroid 25 mcg daily

## 2018-07-22 NOTE — Assessment & Plan Note (Signed)
No complaints; treated with trazodone 150 mg nightly

## 2018-07-22 NOTE — Assessment & Plan Note (Signed)
Controlled; continue Coreg 3.125 mg twice daily and indoor 30 mg daily

## 2018-08-10 ENCOUNTER — Non-Acute Institutional Stay (SKILLED_NURSING_FACILITY): Payer: Medicare Other | Admitting: Internal Medicine

## 2018-08-10 ENCOUNTER — Encounter: Payer: Self-pay | Admitting: Internal Medicine

## 2018-08-10 DIAGNOSIS — F329 Major depressive disorder, single episode, unspecified: Secondary | ICD-10-CM | POA: Diagnosis not present

## 2018-08-10 DIAGNOSIS — F32A Depression, unspecified: Secondary | ICD-10-CM

## 2018-08-10 DIAGNOSIS — F22 Delusional disorders: Secondary | ICD-10-CM

## 2018-08-10 DIAGNOSIS — I5022 Chronic systolic (congestive) heart failure: Secondary | ICD-10-CM

## 2018-08-10 NOTE — Progress Notes (Signed)
Location:  [facility] Room Number: [phone] Place of Service:  SNF [phone])  Noah Delaine. [name], MD  Patient Care Team: [name], MD as PCP - General (Internal Medicine)  Extended Emergency Contact Information Primary Emergency Contact: Tyner,[name] Address: [address] [place], [place] [address] [name] Work Phone: [phone] Mobile Phone: [phone] Relation: Daughter Secondary Emergency Contact: Bevely, Hackbart Mobile Phone: [phone] Relation: Son    Allergies: Patient has no known allergies.  Chief Complaint  Patient presents with  . Medical Management of Chronic Issues    Routine Visit    HPI: Patient is 77 y.o. female who is being seen for routine issues of chronic systolic congestive heart failure, paranoia, and depression.  Past Medical History:  Diagnosis Date  . A-fib ([place]) 01/07/2012  . ANEMIA 07/29/2010   Qualifier: Diagnosis of  By: Bobby Rumpf CMA (AAMA), [place]    . Angina pectoris ([place])   . Anxiety   . Asthma   . Asthma 01/07/2012  . Barrett's esophagus   . Bronchitis   . 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 Chest pain   . Coronary artery disease    right carotid occlusion   . Dementia with behavioral disturbance 09/02/2016  . Depression   . Dysrhythmia    atrial fibrillation  . GERD (gastroesophageal reflux disease)   . 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 Hyperlipidemia   . Hypertension   . Mood disorder ([place])   . Paranoia ([place]) 10/03/2016  . Recurrent upper respiratory infection (URI)   . Stroke [facility])    2007, after left endarterectomy    Past Surgical History:  Procedure Laterality Date  . ABDOMINAL HYSTERECTOMY    . BALLOON DILATION  10/18/2012   Procedure: BALLOON DILATION;  Surgeon: Lear Ng, MD;  [facility];  Service: Endoscopy;  Laterality: N/A;  . CARDIAC CATHETERIZATION    . CAROTID ENDARTERECTOMY     left, complicated by stroke  . CHOLECYSTECTOMY       incidental at time of colectomy  . COLONOSCOPY  12/01/2011   Procedure: COLONOSCOPY;  Surgeon: Lear Ng, MD;  [facility];  Service: Endoscopy;  Laterality: N/A;  . COLONOSCOPY  10/18/2012   Procedure: COLONOSCOPY;  Surgeon: Lear Ng, MD;  [facility];  Service: Endoscopy;  Laterality: N/A;  colonic dilation  . ESOPHAGOGASTRODUODENOSCOPY  12/01/2011   Procedure: ESOPHAGOGASTRODUODENOSCOPY (EGD);  Surgeon: Lear Ng, MD;  Location: [facility];  Service: Endoscopy;  Laterality: N/A;  . HOT HEMOSTASIS  12/01/2011   Procedure: HOT HEMOSTASIS (ARGON PLASMA COAGULATION/BICAP);  Surgeon: Lear Ng, MD;  Location: [facility];  Service: Endoscopy;  Laterality: N/A;  . 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 Always use your most recent med list.          alendronate 70 MG tablet Commonly known as:  FOSAMAX Take 70 mg by mouth every Wednesday. Take with a full glass of water on an empty stomach.   budesonide-formoterol 160-4.5 MCG/ACT inhaler Commonly known as:  SYMBICORT Inhale 2 puffs into the lungs 2 (two) times daily.   carvedilol 3.125 MG tablet Commonly known as:  COREG  Take 1 tablet (3.125 mg total) by mouth 2 (two) times daily with a meal.   fluticasone 50 MCG/ACT nasal spray Commonly known as:  FLONASE Place 1 spray into both nostrils daily.   isosorbide mononitrate 30 MG 24 hr tablet Commonly known as:  IMDUR Take 30 mg by mouth daily.   levothyroxine 25 MCG tablet Commonly known as:  SYNTHROID, LEVOTHROID Take 25 mcg by mouth daily before breakfast.   mirtazapine 15 MG tablet Commonly known as:  REMERON Take 7.5 mg by mouth at bedtime. 1/2 TAB   PROTONIX 40 mg/20 mL Pack Generic drug:  pantoprazole sodium Mix 1 packet on 1 teaspoonful of applesauce  or 5 mL and take daily, DO NOT ADMINISTER WITH ANY OTHER LIQUIDS OR FOODS   QUEtiapine 100 MG tablet Commonly known as:  SEROQUEL Take 100 mg by mouth every morning.   QUEtiapine 25 MG tablet Commonly known as:  SEROQUEL Take 75 mg by mouth at bedtime.   rivaroxaban 10 MG Tabs tablet Commonly known as:  XARELTO Take 15 mg by mouth daily. 1 and 1/2 tablet   sertraline 50 MG tablet Commonly known as:  ZOLOFT Take 50 mg by mouth daily.   simvastatin 10 MG tablet Commonly known as:  ZOCOR Take 10 mg by mouth at bedtime.   tiotropium 18 MCG inhalation capsule Commonly known as:  SPIRIVA Place 18 mcg into inhaler and inhale daily.   traZODone 150 MG tablet Commonly known as:  DESYREL Take 150 mg by mouth at bedtime.       No orders of the defined types were placed in this encounter.   Immunization History  Administered Date(s) Administered  . Influenza-Unspecified 08/26/2016, 10/21/2017  . PPD Test 08/16/2016  . Pneumococcal Polysaccharide-23 01/08/2012    Social History   Tobacco Use  . Smoking status: Former Smoker    Last attempt to quit: 04/09/2003    Years since quitting: 15.3  . Smokeless tobacco: Never Used  Substance Use Topics  . Alcohol use: No    Review of Systems  DATA OBTAINED: from patient, nurse GENERAL:  no fevers, fatigue, appetite changes SKIN: No itching, rash HEENT: No complaint RESPIRATORY: No cough, wheezing, SOB CARDIAC: No chest pain, palpitations, lower extremity edema  GI: No abdominal pain, No N/V/D or constipation, No heartburn or reflux  GU: No dysuria, frequency or urgency, or incontinence  MUSCULOSKELETAL: No unrelieved bone/joint pain NEUROLOGIC: No headache, dizziness  PSYCHIATRIC: No overt anxiety or sadness  Vitals:   08/10/18 1328  BP: (!) 142/74  Pulse: 98  Resp: 17  Temp: (!) 97.4 F (36.3 C)   Body mass index is 28.94 kg/m. Physical Exam  GENERAL APPEARANCE: Alert, conversant, No acute distress  SKIN: No  diaphoresis rash HEENT: Unremarkable RESPIRATORY: Breathing is even, unlabored. Lung sounds are clear   CARDIOVASCULAR: Heart RRR no murmurs, rubs or gallops. No peripheral edema  GASTROINTESTINAL: Abdomen is soft, non-tender, not distended w/ normal bowel sounds.  GENITOURINARY: Bladder non tender, not distended  MUSCULOSKELETAL: No abnormal joints or musculature NEUROLOGIC: Cranial nerves 2-12 grossly intact. Moves all extremities PSYCHIATRIC: Mood and affect a little strange but appropriate to situation, no behavioral issues  Patient Active Problem List   Diagnosis Date Noted  . 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 Insomnia 12/19/2017  . Urinary tract infection due to extended-spectrum beta lactamase (ESBL) producing Escherichia coli 11/17/2017  . Dysphagia, oropharyngeal 11/15/2017  . Altered mental status 11/10/2017  . Acute metabolic encephalopathy 56/38/7564  . Mania ([place]) 06/21/2017  .  Hypomagnesemia 06/19/2017  . Essential hypertension 06/18/2017  . Acute encephalopathy 06/17/2017  . Acute lower UTI 06/17/2017  . E. coli UTI 06/17/2017  . Depression 04/24/2017  . Hypothyroidism 10/03/2016  . Paranoia ([place]) 10/03/2016  . LOC (loss of consciousness) ([place]) 09/02/2016  . 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 Dementia with behavioral disturbance 09/02/2016  . Asthma with acute exacerbation 08/21/2016  . Atrial fibrillation with RVR ([place]) 08/21/2016  . Elevated INR 08/21/2016  . Chronic systolic heart failure ([place]) 08/21/2016  . Hyperlipidemia 08/21/2016  . Electrolyte abnormality 08/21/2016  . HCAP (healthcare-associated pneumonia) 08/10/2016  . Sepsis ([place]) 08/09/2016  . Coronary atherosclerosis of native coronary artery 01/21/2014  . Obesity 01/17/2014  . Abdominal pain, generalized 10/18/2012  . GERD (gastroesophageal reflux disease)   . Mood disorder ([place])   . Anxiety   . 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[place])   . Stroke ([place])   .  Chest pain 01/07/2012  . Asthma 01/07/2012  . A-fib ([place]) 01/07/2012  . 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 Anemia 01/07/2012  . ANEMIA 07/29/2010  . BARRETTS ESOPHAGUS 06/04/2010    CMP     Component Value Date/Time   NA 140 04/29/2018 0938   NA 142 12/21/2017   K 4.7 04/29/2018 0938   CL 103 04/29/2018 0938   CO2 29 04/29/2018 0938   GLUCOSE 102 (H) 04/29/2018 0938   BUN 9 04/29/2018 0938   BUN 19 12/21/2017   CREATININE 1.00 04/29/2018 0938   CALCIUM 9.6 04/29/2018 0938   PROT 6.9 11/08/2017 1512   ALBUMIN 3.8 11/08/2017 1512   AST 18 11/08/2017 1512   ALT 20 11/08/2017 1512   ALKPHOS 64 11/08/2017 1512   BILITOT 0.9 11/08/2017 1512   GFRNONAA 53 (L) 04/29/2018 0938   GFRAA >60 04/29/2018 0938   Recent Labs    11/08/17 1512 11/09/17 0822 11/15/17 12/21/17 04/29/18 0938  NA 141 143 137 142 140  K 3.7 3.6 3.9 4.2 4.7  CL 105 112*  --   --  103  CO2 26 23  --   --  29  GLUCOSE 150* 109*  --   --  102*  BUN 20 16 11 19 9   CREATININE 0.94 0.86 0.8 1.0 1.00  CALCIUM 9.9 9.5  --   --  9.6   Recent Labs    11/08/17 1512  AST 18  ALT 20  ALKPHOS 64  BILITOT 0.9  PROT 6.9  ALBUMIN 3.8   Recent Labs    11/08/17 1512 11/09/17 0822 11/15/17 12/21/17 04/29/18 0938  WBC 7.0 6.4 9.3 5.0 6.6  NEUTROABS 5.4  --   --   --  5.0  HGB 13.8 12.8 13.6 11.7* 13.4  HCT 42.8 40.0 41 37 42.8  MCV 87.2 87.9  --   --  84.9  PLT 233 209 193 162 150   No results for input(s): CHOL, LDLCALC, TRIG in the last 8760 hours.  Invalid input(s): HCL No results found for: [facility] Lab Results  Component Value Date   TSH 1.680 11/08/2017   Lab Results  Component Value Date   HGBA1C 5.4 04/08/2017   Lab Results  Component Value Date   CHOL 109 06/19/2017   HDL 58 06/19/2017   LDLCALC 37 06/19/2017   TRIG 68 06/19/2017   CHOLHDL 1.9 06/19/2017    Significant Diagnostic Results in last 30 days:  No results found.  Assessment and Plan  Chronic  systolic  heart failure (HCC) No exacerbation; continue Coreg 3.125 mg twice daily; patient is not on diuretic at this time  Paranoia [facility]) Patient is not exhibiting any paranoid behavior at this time; plan to continue Seroquel 100 mg every morning and 75 mg at bedtime  Depression Patient appears content; continue trazodone 150 mg nightly and Zoloft 50 mg daily     Anne D. [name], MD

## 2018-08-13 ENCOUNTER — Encounter: Payer: Self-pay | Admitting: Internal Medicine

## 2018-08-13 NOTE — Assessment & Plan Note (Signed)
Patient appears content; continue trazodone 150 mg nightly and Zoloft 50 mg daily

## 2018-08-13 NOTE — Assessment & Plan Note (Signed)
No exacerbation; continue Coreg 3.125 mg twice daily; patient is not on diuretic at this time

## 2018-08-13 NOTE — Assessment & Plan Note (Signed)
Patient is not exhibiting any paranoid behavior at this time; plan to continue Seroquel 100 mg every morning and 75 mg at bedtime

## 2018-09-05 ENCOUNTER — Encounter: Payer: Self-pay | Admitting: Internal Medicine

## 2018-09-05 ENCOUNTER — Non-Acute Institutional Stay (SKILLED_NURSING_FACILITY): Payer: Medicare Other | Admitting: Internal Medicine

## 2018-09-05 DIAGNOSIS — I480 Paroxysmal atrial fibrillation: Secondary | ICD-10-CM | POA: Diagnosis not present

## 2018-09-05 DIAGNOSIS — K219 Gastro-esophageal reflux disease without esophagitis: Secondary | ICD-10-CM

## 2018-09-05 DIAGNOSIS — I25119 Atherosclerotic heart disease of native coronary artery with unspecified angina pectoris: Secondary | ICD-10-CM

## 2018-09-05 NOTE — Progress Notes (Signed)
Location:  [facility] Room Number: [phone] Place of Service:  SNF [phone])  Cindy [name]. Cindy Coil, MD  Patient Care Team: [name], MD as PCP - General (Internal Medicine)  Extended Emergency Contact Information Primary Emergency Contact: Tyner,[name] Address: [address] [place], [place] [address] [name] Work Phone: [phone] Mobile Phone: [phone] Relation: Daughter Secondary Emergency Contact: Perpetua, Elling Mobile Phone: [phone] Relation: Son    Allergies: Patient has no known allergies.  Chief Complaint  Patient presents with  . Medical Management of Chronic Issues    Routine Visit  . Health Maintenance    Influenza vacc.     HPI: Patient is 77 y.o. female who is being seen for routine issues of atrial fib, GERD, and coronary artery disease.  Past Medical History:  Diagnosis Date  . A-fib ([place]) 01/07/2012  . ANEMIA 07/29/2010   Qualifier: Diagnosis of  By: Bobby Rumpf CMA (AAMA), [place]    . Angina pectoris ([place])   . Anxiety   . Asthma   . Asthma 01/07/2012  . Barrett's esophagus   . Bronchitis   . 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 Chest pain   . Coronary artery disease    right carotid occlusion   . Dementia with behavioral disturbance ([place]) 09/02/2016  . Depression   . Dysrhythmia    atrial fibrillation  . GERD (gastroesophageal reflux disease)   . 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 Hyperlipidemia   . Hypertension   . Mood disorder ([place])   . Paranoia ([place]) 10/03/2016  . Recurrent upper respiratory infection (URI)   . Stroke [facility])    2007, after left endarterectomy    Past Surgical History:  Procedure Laterality Date  . ABDOMINAL HYSTERECTOMY    . BALLOON DILATION  10/18/2012   Procedure: BALLOON DILATION;  Surgeon: Lear Ng, MD;  [facility]: [facility];  Service: Endoscopy;  Laterality: N/A;  . CARDIAC CATHETERIZATION    . CAROTID ENDARTERECTOMY     left, complicated by  stroke  . CHOLECYSTECTOMY     incidental at time of colectomy  . COLONOSCOPY  12/01/2011   Procedure: COLONOSCOPY;  Surgeon: Lear Ng, MD;  [facility];  Service: Endoscopy;  Laterality: N/A;  . COLONOSCOPY  10/18/2012   Procedure: COLONOSCOPY;  Surgeon: Lear Ng, MD;  [facility];  Service: Endoscopy;  Laterality: N/A;  colonic dilation  . ESOPHAGOGASTRODUODENOSCOPY  12/01/2011   Procedure: ESOPHAGOGASTRODUODENOSCOPY (EGD);  Surgeon: Lear Ng, MD;  Location: [facility];  Service: Endoscopy;  Laterality: N/A;  . HOT HEMOSTASIS  12/01/2011   Procedure: HOT HEMOSTASIS (ARGON PLASMA COAGULATION/BICAP);  Surgeon: Lear Ng, MD;  Location: [facility];  Service: Endoscopy;  Laterality: N/A;  . 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 Always use your most recent med list.          alendronate 70 MG tablet Commonly known as:  FOSAMAX Take 70 mg by mouth every Wednesday. Take with a full glass of water on an empty stomach.   budesonide-formoterol 160-4.5 MCG/ACT inhaler Commonly known as:  SYMBICORT Inhale 2 puffs into the lungs 2 (two) times daily.  carvedilol 3.125 MG tablet Commonly known as:  COREG Take 1 tablet (3.125 mg total) by mouth 2 (two) times daily with a meal.   fluticasone 50 MCG/ACT nasal spray Commonly known as:  FLONASE Place 1 spray into both nostrils daily.   isosorbide mononitrate 30 MG 24 hr tablet Commonly known as:  IMDUR Take 30 mg by mouth daily.   levothyroxine 25 MCG tablet Commonly known as:  SYNTHROID, LEVOTHROID Take 25 mcg by mouth daily before breakfast.   mirtazapine 15 MG tablet Commonly known as:  REMERON Take 7.5 mg by mouth at bedtime. 1/2 TAB   PROTONIX 40 mg/20 mL Pack Generic drug:  pantoprazole sodium Mix 1 packet on  1 teaspoonful of applesauce or 5 mL and take daily, DO NOT ADMINISTER WITH ANY OTHER LIQUIDS OR FOODS   QUEtiapine 100 MG tablet Commonly known as:  SEROQUEL Take 100 mg by mouth every morning.   rivaroxaban 10 MG Tabs tablet Commonly known as:  XARELTO Take 15 mg by mouth daily. 1 and 1/2 tablet   sertraline 50 MG tablet Commonly known as:  ZOLOFT Take 50 mg by mouth daily.   simvastatin 10 MG tablet Commonly known as:  ZOCOR Take 10 mg by mouth at bedtime.   tiotropium 18 MCG inhalation capsule Commonly known as:  SPIRIVA Place 18 mcg into inhaler and inhale daily.   traZODone 150 MG tablet Commonly known as:  DESYREL Take 150 mg by mouth at bedtime.       No orders of the defined types were placed in this encounter.   Immunization History  Administered Date(s) Administered  . Influenza-Unspecified 08/26/2016, 10/21/2017  . PPD Test 08/16/2016  . Pneumococcal Polysaccharide-23 01/08/2012    Social History   Tobacco Use  . Smoking status: Former Smoker    Last attempt to quit: 04/09/2003    Years since quitting: 15.4  . Smokeless tobacco: Never Used  Substance Use Topics  . Alcohol use: No    Review of Systems  DATA OBTAINED: from patient, nurse GENERAL:  no fevers, fatigue, appetite changes SKIN: No itching, rash HEENT: No complaint RESPIRATORY: No cough, wheezing, SOB CARDIAC: No chest pain, palpitations, lower extremity edema  GI: No abdominal pain, No N/V/D or constipation, No heartburn or reflux  GU: No dysuria, frequency or urgency, or incontinence  MUSCULOSKELETAL: No unrelieved bone/joint pain NEUROLOGIC: No headache, dizziness  PSYCHIATRIC: No overt anxiety or sadness  Vitals:   09/05/18 0936  BP: 110/77  Pulse: 70  Resp: 20  Temp: (!) 96.6 F (35.9 C)   Body mass index is 28.94 kg/m. Physical Exam  GENERAL APPEARANCE: Alert, conversant, No acute distress  SKIN: No diaphoresis rash HEENT: Unremarkable RESPIRATORY: Breathing is  even, unlabored. Lung sounds are clear   CARDIOVASCULAR: Heart RRR no murmurs, rubs or gallops. No peripheral edema  GASTROINTESTINAL: Abdomen is soft, non-tender, not distended w/ normal bowel sounds.  GENITOURINARY: Bladder non tender, not distended  MUSCULOSKELETAL: No abnormal joints or musculature NEUROLOGIC: Cranial nerves 2-12 grossly intact. Moves all extremities PSYCHIATRIC: Dementia, beginning to have behaviors again  Patient Active Problem List   Diagnosis Date Noted  . Psychoses ([place]) 12/19/2017  . Insomnia 12/19/2017  . Urinary tract infection due to extended-spectrum beta lactamase (ESBL) producing Escherichia coli 11/17/2017  . Dysphagia, oropharyngeal 11/15/2017  . Altered mental status 11/10/2017  . Acute metabolic encephalopathy 27/04/2375  . Mania ([place]) 06/21/2017  . Hypomagnesemia 06/19/2017  . Essential hypertension 06/18/2017  . Acute encephalopathy 06/17/2017  . Acute  lower UTI 06/17/2017  . E. coli UTI 06/17/2017  . Depression 04/24/2017  . Hypothyroidism 10/03/2016  . Paranoia ([place]) 10/03/2016  . LOC (loss of consciousness) ([place]) 09/02/2016  . 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 Dementia with behavioral disturbance ([place]) 09/02/2016  . Asthma with acute exacerbation 08/21/2016  . Atrial fibrillation with RVR ([place]) 08/21/2016  . Elevated INR 08/21/2016  . Chronic systolic heart failure ([place]) 08/21/2016  . Hyperlipidemia 08/21/2016  . Electrolyte abnormality 08/21/2016  . HCAP (healthcare-associated pneumonia) 08/10/2016  . Sepsis ([place]) 08/09/2016  . Coronary atherosclerosis of native coronary artery 01/21/2014  . Obesity 01/17/2014  . Abdominal pain, generalized 10/18/2012  . GERD (gastroesophageal reflux disease)   . Mood disorder ([place])   . Anxiety   . 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[place])   . Stroke ([place])   . Chest pain 01/07/2012  . Asthma 01/07/2012  . A-fib ([place]) 01/07/2012  . History of CVA with  residual deficit 01/07/2012  . Anemia 01/07/2012  . ANEMIA 07/29/2010  . BARRETTS ESOPHAGUS 06/04/2010    CMP     Component Value Date/Time   NA 140 09/06/2018   K 4.0 09/06/2018   CL 103 04/29/2018 0938   CO2 29 04/29/2018 0938   GLUCOSE 102 (H) 04/29/2018 0938   BUN 22 (A) 09/06/2018   CREATININE 1.0 09/06/2018   CREATININE 1.00 04/29/2018 0938   CALCIUM 9.6 04/29/2018 0938   PROT 6.9 11/08/2017 1512   ALBUMIN 3.8 11/08/2017 1512   AST 15 09/06/2018   ALT 11 09/06/2018   ALKPHOS 65 09/06/2018   BILITOT 0.9 11/08/2017 1512   GFRNONAA 53 (L) 04/29/2018 0938   GFRAA >60 04/29/2018 0938   Recent Labs    11/08/17 1512 11/09/17 0822  12/21/17 04/29/18 0938 09/06/18  NA 141 143   < > 142 140 140  K 3.7 3.6   < > 4.2 4.7 4.0  CL 105 112*  --   --  103  --   CO2 26 23  --   --  29  --   GLUCOSE 150* 109*  --   --  102*  --   BUN 20 16   < > 19 9 22*  CREATININE 0.94 0.86   < > 1.0 1.00 1.0  CALCIUM 9.9 9.5  --   --  9.6  --    < > = values in this interval not displayed.   Recent Labs    11/08/17 1512 09/06/18  AST 18 15  ALT 20 11  ALKPHOS 64 65  BILITOT 0.9  --   PROT 6.9  --   ALBUMIN 3.8  --    Recent Labs    11/08/17 1512 11/09/17 0822  12/21/17 04/29/18 0938 09/06/18  WBC 7.0 6.4   < > 5.0 6.6 6.8  NEUTROABS 5.4  --   --   --  5.0  --   HGB 13.8 12.8   < > 11.7* 13.4 13.7  HCT 42.8 40.0   < > 37 42.8 40  MCV 87.2 87.9  --   --  84.9  --   PLT 233 209   < > 162 150 208   < > = values in this interval not displayed.   No results for input(s): CHOL, LDLCALC, TRIG in the last 8760 hours.  Invalid input(s): HCL No results found for: [facility] Lab Results  Component Value Date   TSH 3.15 09/06/2018   Lab Results  Component Value Date   HGBA1C 5.4 04/08/2017   Lab Results  Component Value Date   CHOL 109 06/19/2017   HDL 58 06/19/2017   LDLCALC 37 06/19/2017   TRIG 68 06/19/2017   CHOLHDL 1.9 06/19/2017    Significant Diagnostic Results  in last 30 days:  No results found.  Assessment and Plan  A-fib ([place]) Stable; continue Xarelto 50 mg daily as prophylaxis and Coreg 3.125 mg twice daily for rate control  GERD (gastroesophageal reflux disease) No reports of reflux and no complaint of reflux; continue Protonix 40 mg daily  Coronary atherosclerosis of native coronary artery No complaints of chest pain or equivalent; continue Imdur 30 mg p.o. daily, Coreg 3.125 mg twice daily and Xarelto 15 mg daily; patient is on statin    Webb Silversmith D. Cindy Coil, MD

## 2018-09-06 LAB — CBC AND DIFFERENTIAL
HCT: 40 (ref 36–46)
HEMOGLOBIN: 13.7 (ref 12.0–16.0)
Platelets: 208 (ref 150–399)
WBC: 6.8

## 2018-09-06 LAB — TSH: TSH: 3.15 (ref 0.41–5.90)

## 2018-09-06 LAB — BASIC METABOLIC PANEL
BUN: 22 — AB (ref 4–21)
Creatinine: 1 (ref 0.5–1.1)
GLUCOSE: 88
POTASSIUM: 4 (ref 3.4–5.3)
SODIUM: 140 (ref 137–147)

## 2018-09-06 LAB — HEPATIC FUNCTION PANEL
ALT: 11 (ref 7–35)
AST: 15 (ref 13–35)
Alkaline Phosphatase: 65 (ref 25–125)
Bilirubin, Total: 0.5

## 2018-09-29 ENCOUNTER — Encounter: Payer: Self-pay | Admitting: Internal Medicine

## 2018-09-29 NOTE — Assessment & Plan Note (Signed)
No complaints of chest pain or equivalent; continue Imdur 30 mg p.o. daily, Coreg 3.125 mg twice daily and Xarelto 15 mg daily; patient is on statin

## 2018-09-29 NOTE — Assessment & Plan Note (Signed)
No reports of reflux and no complaint of reflux; continue Protonix 40 mg daily

## 2018-09-29 NOTE — Assessment & Plan Note (Signed)
Stable; continue Xarelto 50 mg daily as prophylaxis and Coreg 3.125 mg twice daily for rate control 

## 2018-10-09 ENCOUNTER — Encounter: Payer: Self-pay | Admitting: Internal Medicine

## 2018-10-09 ENCOUNTER — Non-Acute Institutional Stay (SKILLED_NURSING_FACILITY): Payer: Medicare Other | Admitting: Internal Medicine

## 2018-10-09 DIAGNOSIS — G301 Alzheimer's disease with late onset: Secondary | ICD-10-CM | POA: Diagnosis not present

## 2018-10-09 DIAGNOSIS — I110 Hypertensive heart disease with heart failure: Secondary | ICD-10-CM | POA: Diagnosis not present

## 2018-10-09 DIAGNOSIS — F39 Unspecified mood [affective] disorder: Secondary | ICD-10-CM | POA: Diagnosis not present

## 2018-10-09 DIAGNOSIS — I5022 Chronic systolic (congestive) heart failure: Secondary | ICD-10-CM | POA: Diagnosis not present

## 2018-10-09 DIAGNOSIS — F0281 Dementia in other diseases classified elsewhere with behavioral disturbance: Secondary | ICD-10-CM

## 2018-10-09 DIAGNOSIS — F02818 Dementia in other diseases classified elsewhere, unspecified severity, with other behavioral disturbance: Secondary | ICD-10-CM

## 2018-10-09 NOTE — Progress Notes (Signed)
Location:  [facility] Room Number: [phone] Place of Service:  SNF [phone])  Cindy [name]. [name], MD  Patient Care Team: [name], MD as PCP - General (Internal Medicine)  Extended Emergency Contact Information Primary Emergency Contact: Tyner,[name] Address: [address] [place], [place] [address] [name] Work Phone: [phone] Mobile Phone: [phone] Relation: Daughter Secondary Emergency Contact: Tracyann, Duffell Mobile Phone: [phone] Relation: Son    Allergies: Patient has no known allergies.  Chief Complaint  Patient presents with  . Medical Management of Chronic Issues    Routine Visit  . Health Maintenance    Influenza vacc.    HPI: Patient is 77 y.o. female who is being seen for routine issues of mood disorder, hypertension, and dementia with behaviors.  Past Medical History:  Diagnosis Date  . A-fib ([place]) 01/07/2012  . ANEMIA 07/29/2010   Qualifier: Diagnosis of  By: Bobby Rumpf CMA (AAMA), [place]    . Angina pectoris ([place])   . Anxiety   . Asthma   . Asthma 01/07/2012  . Barrett's esophagus   . Bronchitis   . 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 Chest pain   . Coronary artery disease    right carotid occlusion   . Dementia with behavioral disturbance ([place]) 09/02/2016  . Depression   . Dysrhythmia    atrial fibrillation  . GERD (gastroesophageal reflux disease)   . 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 Hyperlipidemia   . Hypertension   . Mood disorder ([place])   . Paranoia ([place]) 10/03/2016  . Recurrent upper respiratory infection (URI)   . Stroke [facility])    2007, after left endarterectomy    Past Surgical History:  Procedure Laterality Date  . ABDOMINAL HYSTERECTOMY    . BALLOON DILATION  10/18/2012   Procedure: BALLOON DILATION;  Surgeon: Lear Ng, MD;  [facility];  Service: Endoscopy;  Laterality: N/A;  . CARDIAC CATHETERIZATION    . CAROTID ENDARTERECTOMY     left,  complicated by stroke  . CHOLECYSTECTOMY     incidental at time of colectomy  . COLONOSCOPY  12/01/2011   Procedure: COLONOSCOPY;  Surgeon: Lear Ng, MD;  [facility];  Service: Endoscopy;  Laterality: N/A;  . COLONOSCOPY  10/18/2012   Procedure: COLONOSCOPY;  Surgeon: Lear Ng, MD;  [facility];  Service: Endoscopy;  Laterality: N/A;  colonic dilation  . ESOPHAGOGASTRODUODENOSCOPY  12/01/2011   Procedure: ESOPHAGOGASTRODUODENOSCOPY (EGD);  Surgeon: Lear Ng, MD;  Location: [facility];  Service: Endoscopy;  Laterality: N/A;  . HOT HEMOSTASIS  12/01/2011   Procedure: HOT HEMOSTASIS (ARGON PLASMA COAGULATION/BICAP);  Surgeon: Lear Ng, MD;  Location: [facility];  Service: Endoscopy;  Laterality: N/A;  . 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 Always use your most recent med list.          alendronate 70 MG tablet Commonly known as:  FOSAMAX Take 70 mg by mouth every Wednesday. Take with a full glass of water on an empty stomach.   budesonide-formoterol 160-4.5 MCG/ACT inhaler Commonly known as:  SYMBICORT Inhale 2 puffs into the lungs 2 (two) times daily.   carvedilol  3.125 MG tablet Commonly known as:  COREG Take 1 tablet (3.125 mg total) by mouth 2 (two) times daily with a meal.   fluticasone 50 MCG/ACT nasal spray Commonly known as:  FLONASE Place 1 spray into both nostrils daily.   isosorbide mononitrate 30 MG 24 hr tablet Commonly known as:  IMDUR Take 30 mg by mouth daily.   levothyroxine 25 MCG tablet Commonly known as:  SYNTHROID, LEVOTHROID Take 25 mcg by mouth daily before breakfast.   mirtazapine 15 MG tablet Commonly known as:  REMERON Take 7.5 mg by mouth at bedtime. 1/2 TAB   PROTONIX 40 mg/20 mL Pack Generic drug:  pantoprazole  sodium Mix 1 packet on 1 teaspoonful of applesauce or 5 mL and take daily, DO NOT ADMINISTER WITH ANY OTHER LIQUIDS OR FOODS   QUEtiapine 50 MG tablet Commonly known as:  SEROQUEL Take 50 mg by mouth at bedtime. Give 1 tablet by mouth along with 25 mg to equal 75 mg po bid   QUEtiapine 25 MG tablet Commonly known as:  SEROQUEL Take 25 mg by mouth at bedtime. Give 1 tablet by mouth along with 50 mg to equal 75 mg twice daily   rivaroxaban 10 MG Tabs tablet Commonly known as:  XARELTO Take 15 mg by mouth daily. 1 and 1/2 tablet   sertraline 50 MG tablet Commonly known as:  ZOLOFT Take 50 mg by mouth daily.   simvastatin 10 MG tablet Commonly known as:  ZOCOR Take 10 mg by mouth at bedtime.   tiotropium 18 MCG inhalation capsule Commonly known as:  SPIRIVA Place 18 mcg into inhaler and inhale daily.   traZODone 150 MG tablet Commonly known as:  DESYREL Take 150 mg by mouth at bedtime.       No orders of the defined types were placed in this encounter.   Immunization History  Administered Date(s) Administered  . Influenza-Unspecified 08/26/2016, 10/21/2017  . PPD Test 08/16/2016  . Pneumococcal Polysaccharide-23 01/08/2012    Social History   Tobacco Use  . Smoking status: Former Smoker    Last attempt to quit: 04/09/2003    Years since quitting: 15.5  . Smokeless tobacco: Never Used  Substance Use Topics  . Alcohol use: No    Review of Systems  DATA OBTAINED: from patient, nurse GENERAL:  no fevers, fatigue, appetite changes SKIN: No itching, rash HEENT: No complaint RESPIRATORY: No cough, wheezing, SOB CARDIAC: No chest pain, palpitations, lower extremity edema  GI: No abdominal pain, No N/V/D or constipation, No heartburn or reflux  GU: No dysuria, frequency or urgency, or incontinence  MUSCULOSKELETAL: No unrelieved bone/joint pain NEUROLOGIC: No headache, dizziness  PSYCHIATRIC: No overt anxiety or sadness  Vitals:   10/09/18 1557  BP: (!) 150/86   Pulse: 75  Resp: 19  Temp: 97.8 F (36.6 C)   Body mass index is 29.22 kg/m. Physical Exam  GENERAL APPEARANCE: Alert, conversant, No acute distress  SKIN: No diaphoresis rash HEENT: Unremarkable RESPIRATORY: Breathing is even, unlabored. Lung sounds are clear   CARDIOVASCULAR: Heart RRR no murmurs, rubs or gallops. No peripheral edema  GASTROINTESTINAL: Abdomen is soft, non-tender, not distended w/ normal bowel sounds.  GENITOURINARY: Bladder non tender, not distended  MUSCULOSKELETAL: No abnormal joints or musculature NEUROLOGIC: Cranial nerves 2-12 grossly intact. Moves all extremities PSYCHIATRIC: Mood and affect appropriate to situation for her, she is at baseline, no behavioral issues  Patient Active Problem List   Diagnosis Date Noted  . 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  Insomnia 12/19/2017  . Urinary tract infection due to extended-spectrum beta lactamase (ESBL) producing Escherichia coli 11/17/2017  . Dysphagia, oropharyngeal 11/15/2017  . Altered mental status 11/10/2017  . Acute metabolic encephalopathy 30/16/0109  . Mania ([place]) 06/21/2017  . Hypomagnesemia 06/19/2017  . Essential hypertension 06/18/2017  . Acute encephalopathy 06/17/2017  . Acute lower UTI 06/17/2017  . E. coli UTI 06/17/2017  . Depression 04/24/2017  . Hypothyroidism 10/03/2016  . Paranoia ([place]) 10/03/2016  . LOC (loss of consciousness) ([place]) 09/02/2016  . 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 Dementia with behavioral disturbance ([place]) 09/02/2016  . Asthma with acute exacerbation 08/21/2016  . Atrial fibrillation with RVR ([place]) 08/21/2016  . Elevated INR 08/21/2016  . Chronic systolic heart failure ([place]) 08/21/2016  . Hyperlipidemia 08/21/2016  . Electrolyte abnormality 08/21/2016  . HCAP (healthcare-associated pneumonia) 08/10/2016  . Sepsis ([place]) 08/09/2016  . Coronary atherosclerosis of native coronary artery 01/21/2014  . Obesity 01/17/2014  . Abdominal pain, generalized  10/18/2012  . GERD (gastroesophageal reflux disease)   . Mood disorder ([place])   . Anxiety   . 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[place])   . Stroke ([place])   . Chest pain 01/07/2012  . Asthma 01/07/2012  . A-fib ([place]) 01/07/2012  . 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 Anemia 01/07/2012  . ANEMIA 07/29/2010  . BARRETTS ESOPHAGUS 06/04/2010    CMP     Component Value Date/Time   NA 140 09/06/2018   K 4.0 09/06/2018   CL 103 04/29/2018 0938   CO2 29 04/29/2018 0938   GLUCOSE 102 (H) 04/29/2018 0938   BUN 22 (A) 09/06/2018   CREATININE 1.0 09/06/2018   CREATININE 1.00 04/29/2018 0938   CALCIUM 9.6 04/29/2018 0938   PROT 6.9 11/08/2017 1512   ALBUMIN 3.8 11/08/2017 1512   AST 15 09/06/2018   ALT 11 09/06/2018   ALKPHOS 65 09/06/2018   BILITOT 0.9 11/08/2017 1512   GFRNONAA 53 (L) 04/29/2018 0938   GFRAA >60 04/29/2018 0938   Recent Labs    11/08/17 1512 11/09/17 0822  12/21/17 04/29/18 0938 09/06/18  NA 141 143   < > 142 140 140  K 3.7 3.6   < > 4.2 4.7 4.0  CL 105 112*  --   --  103  --   CO2 26 23  --   --  29  --   GLUCOSE 150* 109*  --   --  102*  --   BUN 20 16   < > 19 9 22*  CREATININE 0.94 0.86   < > 1.0 1.00 1.0  CALCIUM 9.9 9.5  --   --  9.6  --    < > = values in this interval not displayed.   Recent Labs    11/08/17 1512 09/06/18  AST 18 15  ALT 20 11  ALKPHOS 64 65  BILITOT 0.9  --   PROT 6.9  --   ALBUMIN 3.8  --    Recent Labs    11/08/17 1512 11/09/17 0822  12/21/17 04/29/18 0938 09/06/18  WBC 7.0 6.4   < > 5.0 6.6 6.8  NEUTROABS 5.4  --   --   --  5.0  --   HGB 13.8 12.8   < > 11.7* 13.4 13.7  HCT 42.8 40.0   < > 37 42.8 40  MCV 87.2 87.9  --   --  84.9  --   PLT 233 209   < >  162 150 208   < > = values in this interval not displayed.   No results for input(s): CHOL, LDLCALC, TRIG in the last 8760 hours.  Invalid input(s): HCL No results found for: [facility] Lab Results  Component Value  Date   TSH 3.15 09/06/2018   Lab Results  Component Value Date   HGBA1C 5.4 04/08/2017   Lab Results  Component Value Date   CHOL 109 06/19/2017   HDL 58 06/19/2017   LDLCALC 37 06/19/2017   TRIG 68 06/19/2017   CHOLHDL 1.9 06/19/2017    Significant Diagnostic Results in last 30 days:  No results found.  Assessment and Plan  Mood disorder [facility]) Patient had a.  1 to 2 months ago where she look like she was getting ready to decompensate psychiatrically but she has returned to her baseline without hospitalization, so now she is very well controlled on Seroquel 100 every morning and 75 mg at night  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[place]) Appears controlled; continue Coreg 3.125 mg twice daily and Imdur 30 mg daily  Dementia with behavioral disturbance Patient's behavioral disturbance has come and gone without hospitalization and patient's overall dementia does not appear to have progressed; patient is on no meds; continue supportive care     Loriel Diehl D. [name], MD

## 2018-10-14 ENCOUNTER — Encounter: Payer: Self-pay | Admitting: Internal Medicine

## 2018-10-14 NOTE — Assessment & Plan Note (Signed)
Patient's behavioral disturbance has come and gone without hospitalization and patient's overall dementia does not appear to have progressed; patient is on no meds; continue supportive care

## 2018-10-14 NOTE — Assessment & Plan Note (Signed)
Appears controlled; continue Coreg 3.125 mg twice daily and Imdur 30 mg daily

## 2018-10-14 NOTE — Assessment & Plan Note (Signed)
Patient had a.  1 to 2 months ago where she look like she was getting ready to decompensate psychiatrically but she has returned to her baseline without hospitalization, so now she is very well controlled on Seroquel 100 every morning and 75 mg at night

## 2018-11-06 ENCOUNTER — Encounter: Payer: Self-pay | Admitting: Internal Medicine

## 2018-11-06 ENCOUNTER — Non-Acute Institutional Stay (SKILLED_NURSING_FACILITY): Payer: Medicare Other | Admitting: Internal Medicine

## 2018-11-06 DIAGNOSIS — I5022 Chronic systolic (congestive) heart failure: Secondary | ICD-10-CM

## 2018-11-06 DIAGNOSIS — I639 Cerebral infarction, unspecified: Secondary | ICD-10-CM | POA: Diagnosis not present

## 2018-11-06 DIAGNOSIS — F39 Unspecified mood [affective] disorder: Secondary | ICD-10-CM | POA: Diagnosis not present

## 2018-11-06 NOTE — Progress Notes (Signed)
Location:  [facility] Room Number: [phone] Place of Service:  SNF [phone])  Cindy Delaine. [name] Coil, MD  Patient Care Team: [name], MD as PCP - General (Internal Medicine)  Extended Emergency Contact Information Primary Emergency Contact: Tyner,[name] Address: [address] [place],  [address] [name] Work Phone: [phone] Mobile Phone: [phone] Relation: Daughter Secondary Emergency Contact: Samaiya, [place] Mobile Phone: [phone] Relation: Son    Allergies: Patient has no known allergies.  Chief Complaint  Patient presents with  . Medical Management of Chronic Issues    Routine visit    HPI: Patient is 77 y.o. female who is being seen for routine issues of history of CVA, chronic systolic congestive heart heart failure, and mood disorder.  Past Medical History:  Diagnosis Date  . A-fib ([place]) 01/07/2012  . ANEMIA 07/29/2010   Qualifier: Diagnosis of  By: Bobby Rumpf CMA (AAMA), [place]    . Angina pectoris ([place])   . Anxiety   . Asthma   . Asthma 01/07/2012  . Barrett's esophagus   . Bronchitis   . 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 Chest pain   . Coronary artery disease    right carotid occlusion   . Dementia with behavioral disturbance ([employer]) 09/02/2016  . Depression   . Dysrhythmia    atrial fibrillation  . GERD (gastroesophageal reflux disease)   . 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 Hyperlipidemia   . Hypertension   . Mood disorder ([place])   . Paranoia ([place]) 10/03/2016  . Recurrent upper respiratory infection (URI)   . Stroke [facility])    2007, after left endarterectomy    Past Surgical History:  Procedure Laterality Date  . ABDOMINAL HYSTERECTOMY    . BALLOON DILATION  10/18/2012   Procedure: BALLOON DILATION;  Surgeon: Lear Ng, MD;  [facility]: [facility];  Service: Endoscopy;  Laterality: N/A;  . CARDIAC CATHETERIZATION    . CAROTID ENDARTERECTOMY     left, complicated by stroke    . CHOLECYSTECTOMY     incidental at time of colectomy  . COLONOSCOPY  12/01/2011   Procedure: COLONOSCOPY;  Surgeon: Lear Ng, MD;  [facility];  Service: Endoscopy;  Laterality: N/A;  . COLONOSCOPY  10/18/2012   Procedure: COLONOSCOPY;  Surgeon: Lear Ng, MD;  [facility];  Service: Endoscopy;  Laterality: N/A;  colonic dilation  . ESOPHAGOGASTRODUODENOSCOPY  12/01/2011   Procedure: ESOPHAGOGASTRODUODENOSCOPY (EGD);  Surgeon: Lear Ng, MD;  Location: [facility];  Service: Endoscopy;  Laterality: N/A;  . HOT HEMOSTASIS  12/01/2011   Procedure: HOT HEMOSTASIS (ARGON PLASMA COAGULATION/BICAP);  Surgeon: Lear Ng, MD;  Location: [facility];  Service: Endoscopy;  Laterality: N/A;  . 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 Always use your most recent med list.        alendronate 70 MG tablet Commonly known as:  FOSAMAX Take 70 mg by mouth every Wednesday. Take with a full glass of water on an empty stomach.   budesonide-formoterol 160-4.5 MCG/ACT inhaler Commonly known as:  SYMBICORT Inhale 2 puffs into the lungs 2 (two) times daily.   carvedilol 3.125 MG tablet Commonly  known as:  COREG Take 1 tablet (3.125 mg total) by mouth 2 (two) times daily with a meal.   fluticasone 50 MCG/ACT nasal spray Commonly known as:  FLONASE Place 1 spray into both nostrils daily.   isosorbide mononitrate 30 MG 24 hr tablet Commonly known as:  IMDUR Take 30 mg by mouth daily.   levothyroxine 25 MCG tablet Commonly known as:  SYNTHROID, LEVOTHROID Take 25 mcg by mouth daily before breakfast.   mirtazapine 15 MG tablet Commonly known as:  REMERON Take 7.5 mg by mouth at bedtime. 1/2 TAB   PROTONIX 40 mg/20 mL Pack Generic drug:  pantoprazole sodium Mix 1 packet on 1  teaspoonful of applesauce or 5 mL and take daily, DO NOT ADMINISTER WITH ANY OTHER LIQUIDS OR FOODS   QUEtiapine 50 MG tablet Commonly known as:  SEROQUEL Take 50 mg by mouth at bedtime. Give 1 tablet by mouth along with 25 mg to equal 75 mg po bid   QUEtiapine 25 MG tablet Commonly known as:  SEROQUEL Take 25 mg by mouth at bedtime. Give 1 tablet by mouth along with 50 mg to equal 75 mg twice daily   rivaroxaban 10 MG Tabs tablet Commonly known as:  XARELTO Take 15 mg by mouth daily. 1 and 1/2 tablet   sertraline 50 MG tablet Commonly known as:  ZOLOFT Take 50 mg by mouth daily.   sertraline 25 MG tablet Commonly known as:  ZOLOFT Take 25 mg by mouth daily. Take 1 tablet, along with Sertraline 50 mg to equal 75mg , by mouth daily   simvastatin 10 MG tablet Commonly known as:  ZOCOR Take 10 mg by mouth at bedtime.   tiotropium 18 MCG inhalation capsule Commonly known as:  SPIRIVA Place 18 mcg into inhaler and inhale daily.   traZODone 150 MG tablet Commonly known as:  DESYREL Take 150 mg by mouth at bedtime.       No orders of the defined types were placed in this encounter.   Immunization History  Administered Date(s) Administered  . Influenza-Unspecified 08/26/2016, 10/21/2017  . PPD Test 08/16/2016  . Pneumococcal Polysaccharide-23 01/08/2012    Social History   Tobacco Use  . Smoking status: Former Smoker    Last attempt to quit: 04/09/2003    Years since quitting: 15.6  . Smokeless tobacco: Never Used  Substance Use Topics  . Alcohol use: No    Review of Systems  DATA OBTAINED: from patient, nurse GENERAL:  no fevers, fatigue, appetite changes SKIN: No itching, rash HEENT: No complaint RESPIRATORY: No cough, wheezing, SOB CARDIAC: No chest pain, palpitations, lower extremity edema  GI: No abdominal pain, No N/V/D or constipation, No heartburn or reflux  GU: No dysuria, frequency or urgency, or incontinence  MUSCULOSKELETAL: No unrelieved  bone/joint pain NEUROLOGIC: No headache, dizziness  PSYCHIATRIC: No overt anxiety or sadness  Vitals:   11/06/18 1443  BP: (!) 152/78  Pulse: 86  Resp: 18  Temp: 98.2 F (36.8 C)   Body mass index is 28.67 kg/m. Physical Exam  GENERAL APPEARANCE: Alert, pleasant, minimally conversant, No acute distress  SKIN: No diaphoresis rash HEENT: Unremarkable RESPIRATORY: Breathing is even, unlabored. Lung sounds are clear   CARDIOVASCULAR: Heart RRR no murmurs, rubs or gallops. No peripheral edema  GASTROINTESTINAL: Abdomen is soft, non-tender, not distended w/ normal bowel sounds.  GENITOURINARY: Bladder non tender, not distended  MUSCULOSKELETAL: No abnormal joints or musculature NEUROLOGIC: Cranial nerves 2-12 grossly intact. Moves all extremities PSYCHIATRIC: Mood and affect  appropriate to situation always ornery appearing, no behavioral issues  Patient Active Problem List   Diagnosis Date Noted  . 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 Insomnia 12/19/2017  . Urinary tract infection due to extended-spectrum beta lactamase (ESBL) producing Escherichia coli 11/17/2017  . Dysphagia, oropharyngeal 11/15/2017  . Altered mental status 11/10/2017  . Acute metabolic encephalopathy 21/19/4174  . Mania ([place]) 06/21/2017  . Hypomagnesemia 06/19/2017  . Essential hypertension 06/18/2017  . Acute encephalopathy 06/17/2017  . Acute lower UTI 06/17/2017  . E. coli UTI 06/17/2017  . Depression 04/24/2017  . Hypothyroidism 10/03/2016  . Paranoia ([place]) 10/03/2016  . LOC (loss of consciousness) ([place]) 09/02/2016  . 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 Dementia with behavioral disturbance ([place]) 09/02/2016  . Asthma with acute exacerbation 08/21/2016  . Atrial fibrillation with RVR ([place]) 08/21/2016  . Elevated INR 08/21/2016  . Chronic systolic heart failure ([place]) 08/21/2016  . Hyperlipidemia 08/21/2016  . Electrolyte abnormality 08/21/2016  . HCAP (healthcare-associated pneumonia)  08/10/2016  . Sepsis ([place]) 08/09/2016  . Coronary atherosclerosis of native coronary artery 01/21/2014  . Obesity 01/17/2014  . Abdominal pain, generalized 10/18/2012  . GERD (gastroesophageal reflux disease)   . Mood disorder ([place])   . Anxiety   . 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[place])   . Stroke ([place])   . Chest pain 01/07/2012  . Asthma 01/07/2012  . A-fib ([place]) 01/07/2012  . 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 Anemia 01/07/2012  . ANEMIA 07/29/2010  . BARRETTS ESOPHAGUS 06/04/2010    CMP     Component Value Date/Time   NA 140 09/06/2018   K 4.0 09/06/2018   CL 103 04/29/2018 0938   CO2 29 04/29/2018 0938   GLUCOSE 102 (H) 04/29/2018 0938   BUN 22 (A) 09/06/2018   CREATININE 1.0 09/06/2018   CREATININE 1.00 04/29/2018 0938   CALCIUM 9.6 04/29/2018 0938   PROT 6.9 11/08/2017 1512   ALBUMIN 3.8 11/08/2017 1512   AST 15 09/06/2018   ALT 11 09/06/2018   ALKPHOS 65 09/06/2018   BILITOT 0.9 11/08/2017 1512   GFRNONAA 53 (L) 04/29/2018 0938   GFRAA >60 04/29/2018 0938   Recent Labs    12/21/17 04/29/18 0938 09/06/18  NA 142 140 140  K 4.2 4.7 4.0  CL  --  103  --   CO2  --  29  --   GLUCOSE  --  102*  --   BUN 19 9 22*  CREATININE 1.0 1.00 1.0  CALCIUM  --  9.6  --    Recent Labs    09/06/18  AST 15  ALT 11  ALKPHOS 65   Recent Labs    12/21/17 04/29/18 0938 09/06/18  WBC 5.0 6.6 6.8  NEUTROABS  --  5.0  --   HGB 11.7* 13.4 13.7  HCT 37 42.8 40  MCV  --  84.9  --   PLT 162 150 208   No results for input(s): CHOL, LDLCALC, TRIG in the last 8760 hours.  Invalid input(s): HCL No results found for: [facility] Lab Results  Component Value Date   TSH 3.15 09/06/2018   Lab Results  Component Value Date   HGBA1C 5.4 04/08/2017   Lab Results  Component Value Date   CHOL 109 06/19/2017   HDL 58 06/19/2017   LDLCALC 37 06/19/2017   TRIG 68 06/19/2017   CHOLHDL 1.9 06/19/2017    Significant Diagnostic  Results in last 30 days:  No results found.  Assessment and Plan  Stroke [facility]) Reported problems; continue ASA 81 mg daily as prophylaxis; patient is on statin and patient is on blood pressure medication  Chronic systolic heart failure (HCC) No recent exacerbations reported; continue Coreg 3.125 mg twice daily; patient is not on diuretic at this time  Mood disorder ([place]) Continues to be stable; she is pleasant, often in the hallway; controlled on Seroquel 100 mg every morning and 75 mg nightly Zoloft 75 mg daily and Elavil 150 mg daily    Favor Kreh D. [name] Coil, MD

## 2018-11-12 ENCOUNTER — Encounter: Payer: Self-pay | Admitting: Internal Medicine

## 2018-11-12 NOTE — Assessment & Plan Note (Signed)
No recent exacerbations reported; continue Coreg 3.125 mg twice daily; patient is not on diuretic at this time

## 2018-11-12 NOTE — Assessment & Plan Note (Signed)
Reported problems; continue ASA 81 mg daily as prophylaxis; patient is on statin and patient is on blood pressure medication

## 2018-11-12 NOTE — Assessment & Plan Note (Signed)
Continues to be stable; she is pleasant, often in the hallway; controlled on Seroquel 100 mg every morning and 75 mg nightly Zoloft 75 mg daily and Elavil 150 mg daily

## 2018-12-07 ENCOUNTER — Non-Acute Institutional Stay (SKILLED_NURSING_FACILITY): Payer: Medicare Other | Admitting: Internal Medicine

## 2018-12-07 ENCOUNTER — Encounter: Payer: Self-pay | Admitting: Internal Medicine

## 2018-12-07 DIAGNOSIS — J45909 Unspecified asthma, uncomplicated: Secondary | ICD-10-CM

## 2018-12-07 DIAGNOSIS — H3530 Unspecified macular degeneration: Secondary | ICD-10-CM | POA: Diagnosis not present

## 2018-12-07 DIAGNOSIS — E034 Atrophy of thyroid (acquired): Secondary | ICD-10-CM | POA: Diagnosis not present

## 2018-12-07 NOTE — Progress Notes (Signed)
Location:  [facility] Room Number: [phone] Place of Service:  SNF [phone])  Cindy [name]. Cindy Coil, MD  Patient Care Team: [name], MD as PCP - General (Internal Medicine)  Extended Emergency Contact Information Primary Emergency Contact: Tyner,[name] Address: [address] [place], [place] [address] [name] Work Phone: [phone] Mobile Phone: [phone] Relation: Daughter Secondary Emergency Contact: Jamin, Humphries Mobile Phone: [phone] Relation: Son    Allergies: Patient has no known allergies.  Chief Complaint  Patient presents with  . Medical Management of Chronic Issues    Routine Visit    HPI: Patient is 78 y.o. female who is being seen for routine problems of asthma, hypothyroidism and macular degeneration.  Past Medical History:  Diagnosis Date  . A-fib ([place]) 01/07/2012  . ANEMIA 07/29/2010   Qualifier: Diagnosis of  By: Bobby Rumpf CMA (AAMA), Patty    . Angina pectoris ([place])   . Anxiety   . Asthma   . Asthma 01/07/2012  . Barrett's esophagus   . Bronchitis   . 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 Chest pain   . Coronary artery disease    right carotid occlusion   . Dementia with behavioral disturbance ([place]) 09/02/2016  . Depression   . Dysrhythmia    atrial fibrillation  . GERD (gastroesophageal reflux disease)   . 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 Hyperlipidemia   . Hypertension   . Mood disorder ([place])   . Paranoia ([place]) 10/03/2016  . Recurrent upper respiratory infection (URI)   . Stroke [facility])    2007, after left endarterectomy    Past Surgical History:  Procedure Laterality Date  . ABDOMINAL HYSTERECTOMY    . BALLOON DILATION  10/18/2012   Procedure: BALLOON DILATION;  Surgeon: Lear Ng, MD;  [facility];  Service: Endoscopy;  Laterality: N/A;  . CARDIAC CATHETERIZATION    . CAROTID ENDARTERECTOMY     left, complicated by stroke  . CHOLECYSTECTOMY     incidental at time of colectomy  . COLONOSCOPY  12/01/2011   Procedure: COLONOSCOPY;  Surgeon: Lear Ng, MD;  [facility];  Service: Endoscopy;  Laterality: N/A;  . COLONOSCOPY  10/18/2012   Procedure: COLONOSCOPY;  Surgeon: Lear Ng, MD;  [facility];  Service: Endoscopy;  Laterality: N/A;  colonic dilation  . ESOPHAGOGASTRODUODENOSCOPY  12/01/2011   Procedure: ESOPHAGOGASTRODUODENOSCOPY (EGD);  Surgeon: Lear Ng, MD;  Location: [facility];  Service: Endoscopy;  Laterality: N/A;  . HOT HEMOSTASIS  12/01/2011   Procedure: HOT HEMOSTASIS (ARGON PLASMA COAGULATION/BICAP);  Surgeon: Lear Ng, MD;  Location: [facility];  Service: Endoscopy;  Laterality: N/A;  . 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 Always use your most recent med list.        alendronate 70 MG tablet Commonly known as:  FOSAMAX Take 70 mg by mouth every Wednesday. Take with a full glass of water on an empty stomach.   carvedilol 3.125 MG tablet Commonly known as:  COREG Take 1 tablet (3.125 mg total) by mouth 2 (two) times daily with a meal.   fluticasone 50 MCG/ACT nasal spray Commonly known as:  FLONASE Place 1 spray into both nostrils daily.   isosorbide mononitrate 30 MG 24 hr tablet Commonly known as:  IMDUR Take 30 mg by mouth daily.   levothyroxine 25 MCG tablet Commonly known as:  SYNTHROID, LEVOTHROID Take 25 mcg by mouth daily before breakfast.   mirtazapine 15 MG tablet Commonly known as:  REMERON Take 7.5 mg by mouth at bedtime. 1/2 TAB   PRESERVISION AREDS 2 PO Take by mouth. TAKE 1 CAPSULE BY MOUTH TWICE A DAY   PROTONIX 40 mg/20 mL Pack Generic drug:  pantoprazole sodium Mix 1 packet on 1 teaspoonful of applesauce or 5 mL and take daily, DO NOT ADMINISTER WITH ANY  OTHER LIQUIDS OR FOODS   QUEtiapine 100 MG tablet Commonly known as:  SEROQUEL Take 100 mg by mouth 2 (two) times daily.   rivaroxaban 10 MG Tabs tablet Commonly known as:  XARELTO Take 15 mg by mouth daily. 1 and 1/2 tablet   sertraline 100 MG tablet Commonly known as:  ZOLOFT Take 100 mg by mouth 2 (two) times daily.   simvastatin 10 MG tablet Commonly known as:  ZOCOR Take 10 mg by mouth at bedtime.   tiotropium 18 MCG inhalation capsule Commonly known as:  SPIRIVA Place 18 mcg into inhaler and inhale daily.   traZODone 100 MG tablet Commonly known as:  DESYREL Take 200 mg by mouth at bedtime.       No orders of the defined types were placed in this encounter.   Immunization History  Administered Date(s) Administered  . Influenza-Unspecified 08/26/2016, 10/21/2017  . PPD Test 08/16/2016  . Pneumococcal Polysaccharide-23 01/08/2012    Social History   Tobacco Use  . Smoking status: Former Smoker    Last attempt to quit: 04/09/2003    Years since quitting: 15.6  . Smokeless tobacco: Never Used  Substance Use Topics  . Alcohol use: No    Review of Systems  DATA OBTAINED: from patient, nurse GENERAL:  no fevers, fatigue, appetite changes SKIN: No itching, rash HEENT: No complaint RESPIRATORY: No cough, wheezing, SOB CARDIAC: No chest pain, palpitations, lower extremity edema  GI: No abdominal pain, No N/V/D or constipation, No heartburn or reflux  GU: No dysuria, frequency or urgency, or incontinence  MUSCULOSKELETAL: No unrelieved bone/joint pain NEUROLOGIC: No headache, dizziness  PSYCHIATRIC: No overt anxiety or sadness  Vitals:   12/07/18 1405  BP: 116/73  Pulse: 69  Resp: 16  Temp: (!) 97 F (36.1 C)   Body mass index is 26.99 kg/m. Physical Exam  GENERAL APPEARANCE: Alert, conversant, No acute distress  SKIN: No diaphoresis rash HEENT: Unremarkable RESPIRATORY: Breathing is even, unlabored. Lung sounds are clear   CARDIOVASCULAR:  Heart RRR no murmurs, rubs or gallops. No peripheral edema  GASTROINTESTINAL: Abdomen is soft, non-tender, not distended w/ normal bowel sounds.  GENITOURINARY: Bladder non tender, not distended  MUSCULOSKELETAL: No abnormal joints or musculature NEUROLOGIC: Cranial nerves 2-12 grossly intact. Moves all extremities PSYCHIATRIC: Mood and affect appropriate to situation, no behavioral issues  Patient Active Problem List   Diagnosis Date Noted  . Macular degeneration 12/09/2018  . 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 Insomnia 12/19/2017  . Urinary tract infection due to extended-spectrum beta lactamase (ESBL) producing Escherichia coli 11/17/2017  . Dysphagia, oropharyngeal 11/15/2017  . Altered mental status 11/10/2017  . Acute metabolic encephalopathy 78/24/2353  . Mania ([place]) 06/21/2017  . Hypomagnesemia 06/19/2017  . Essential hypertension 06/18/2017  . Acute encephalopathy 06/17/2017  . Acute lower UTI 06/17/2017  .  E. coli UTI 06/17/2017  . Depression 04/24/2017  . Hypothyroidism 10/03/2016  . Paranoia ([place]) 10/03/2016  . LOC (loss of consciousness) ([place]) 09/02/2016  . 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 Dementia with behavioral disturbance ([place]) 09/02/2016  . Asthma with acute exacerbation 08/21/2016  . Atrial fibrillation with RVR ([place]) 08/21/2016  . Elevated INR 08/21/2016  . Chronic systolic heart failure ([place]) 08/21/2016  . Hyperlipidemia 08/21/2016  . Electrolyte abnormality 08/21/2016  . HCAP (healthcare-associated pneumonia) 08/10/2016  . Sepsis ([place]) 08/09/2016  . Coronary atherosclerosis of native coronary artery 01/21/2014  . Obesity 01/17/2014  . Abdominal pain, generalized 10/18/2012  . GERD (gastroesophageal reflux disease)   . Mood disorder ([place])   . Anxiety   . 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[place])   . Stroke ([place])   . Chest pain 01/07/2012  . Asthma 01/07/2012  . A-fib ([place]) 01/07/2012  . History of CVA with  residual deficit 01/07/2012  . Anemia 01/07/2012  . ANEMIA 07/29/2010  . BARRETTS ESOPHAGUS 06/04/2010    CMP     Component Value Date/Time   NA 140 09/06/2018   K 4.0 09/06/2018   CL 103 04/29/2018 0938   CO2 29 04/29/2018 0938   GLUCOSE 102 (H) 04/29/2018 0938   BUN 22 (A) 09/06/2018   CREATININE 1.0 09/06/2018   CREATININE 1.00 04/29/2018 0938   CALCIUM 9.6 04/29/2018 0938   PROT 6.9 11/08/2017 1512   ALBUMIN 3.8 11/08/2017 1512   AST 15 09/06/2018   ALT 11 09/06/2018   ALKPHOS 65 09/06/2018   BILITOT 0.9 11/08/2017 1512   GFRNONAA 53 (L) 04/29/2018 0938   GFRAA >60 04/29/2018 0938   Recent Labs    12/21/17 04/29/18 0938 09/06/18  NA 142 140 140  K 4.2 4.7 4.0  CL  --  103  --   CO2  --  29  --   GLUCOSE  --  102*  --   BUN 19 9 22*  CREATININE 1.0 1.00 1.0  CALCIUM  --  9.6  --    Recent Labs    09/06/18  AST 15  ALT 11  ALKPHOS 65   Recent Labs    12/21/17 04/29/18 0938 09/06/18  WBC 5.0 6.6 6.8  NEUTROABS  --  5.0  --   HGB 11.7* 13.4 13.7  HCT 37 42.8 40  MCV  --  84.9  --   PLT 162 150 208   No results for input(s): CHOL, LDLCALC, TRIG in the last 8760 hours.  Invalid input(s): HCL No results found for: [facility] Lab Results  Component Value Date   TSH 3.15 09/06/2018   Lab Results  Component Value Date   HGBA1C 5.4 04/08/2017   Lab Results  Component Value Date   CHOL 109 06/19/2017   HDL 58 06/19/2017   LDLCALC 37 06/19/2017   TRIG 68 06/19/2017   CHOLHDL 1.9 06/19/2017    Significant Diagnostic Results in last 30 days:  No results found.  Assessment and Plan  Asthma No reported exacerbations; has been stable; continue OTO Truax p.m. (Spiriva) 1 puff daily  Hypothyroidism Recent TSH 3.15, controlled; continue levothyroxine 25 mcg daily  Macular degeneration Not stated as unstable; continue PreserVision AREDS 1 p.o. twice daily    Cindy [name] D. Cindy Coil, MD

## 2018-12-09 ENCOUNTER — Encounter: Payer: Self-pay | Admitting: Internal Medicine

## 2018-12-09 DIAGNOSIS — H3530 Unspecified macular degeneration: Secondary | ICD-10-CM | POA: Insufficient documentation

## 2018-12-09 NOTE — Assessment & Plan Note (Signed)
Not stated as unstable; continue PreserVision AREDS 1 p.o. twice daily

## 2018-12-09 NOTE — Assessment & Plan Note (Signed)
Recent TSH 3.15, controlled; continue levothyroxine 25 mcg daily

## 2018-12-09 NOTE — Assessment & Plan Note (Signed)
No reported exacerbations; has been stable; continue OTO Truax p.m. (Spiriva) 1 puff daily

## 2018-12-11 LAB — LIPID PANEL
CHOLESTEROL: 130 (ref 0–200)
HDL: 49 (ref 35–70)
LDL CALC: 64
LDl/HDL Ratio: 2.6
Triglycerides: 84 (ref 40–160)

## 2018-12-11 LAB — HEMOGLOBIN A1C: Hemoglobin A1C: 5.7

## 2018-12-11 LAB — BASIC METABOLIC PANEL
BUN: 31 — AB (ref 4–21)
Creatinine: 1.2 — AB (ref 0.5–1.1)
GLUCOSE: 79
Potassium: 4.5 (ref 3.4–5.3)
SODIUM: 141 (ref 137–147)

## 2018-12-11 LAB — TSH: TSH: 2.5 (ref 0.41–5.90)

## 2018-12-11 LAB — HEPATIC FUNCTION PANEL
ALK PHOS: 61 (ref 25–125)
ALT: 6 — AB (ref 7–35)
AST: 11 — AB (ref 13–35)
Bilirubin, Total: 0.2

## 2018-12-11 LAB — CBC AND DIFFERENTIAL
HEMATOCRIT: 36 (ref 36–46)
Hemoglobin: 12.2 (ref 12.0–16.0)
Platelets: 153 (ref 150–399)
WBC: 4.8

## 2018-12-11 LAB — VITAMIN D 25 HYDROXY (VIT D DEFICIENCY, FRACTURES): VIT D 25 HYDROXY: 24.07

## 2018-12-21 LAB — BASIC METABOLIC PANEL
BUN: 19 (ref 4–21)
Creatinine: 1 (ref 0.5–1.1)
Glucose: 88
Potassium: 4.2 (ref 3.4–5.3)
Sodium: 142 (ref 137–147)

## 2018-12-21 LAB — CBC AND DIFFERENTIAL
HCT: 37 (ref 36–46)
Hemoglobin: 11.7 — AB (ref 12.0–16.0)
Platelets: 162 (ref 150–399)
WBC: 5

## 2019-01-08 ENCOUNTER — Non-Acute Institutional Stay (SKILLED_NURSING_FACILITY): Payer: Medicare Other | Admitting: Internal Medicine

## 2019-01-08 ENCOUNTER — Encounter: Payer: Self-pay | Admitting: Internal Medicine

## 2019-01-08 DIAGNOSIS — I25119 Atherosclerotic heart disease of native coronary artery with unspecified angina pectoris: Secondary | ICD-10-CM | POA: Diagnosis not present

## 2019-01-08 DIAGNOSIS — R1312 Dysphagia, oropharyngeal phase: Secondary | ICD-10-CM

## 2019-01-08 DIAGNOSIS — I480 Paroxysmal atrial fibrillation: Secondary | ICD-10-CM | POA: Diagnosis not present

## 2019-01-08 NOTE — Progress Notes (Signed)
Location:  [place] Room Number: [phone] Place of Service:  SNF [phone])  Cindy [name]. [name], MD  Patient Care Team: [name], MD as PCP - General (Internal Medicine)  Extended Emergency Contact Information Primary Emergency Contact: Tyner,[name] Address: [address] [place],  [address] [name] Work Phone: [phone] Mobile Phone: [phone] Relation: Daughter Secondary Emergency Contact: [name], [name] Mobile Phone: [phone] Relation: Son    Allergies: Patient has no known allergies.  Chief Complaint  Patient presents with  . Medical Management of Chronic Issues    Routine Visit    HPI: Patient is 78 y.o. female who is being seen for routine issues of atrial fibrillation, coronary artery disease, and dysphasia.  Past Medical History:  Diagnosis Date  . A-fib ([place]) 01/07/2012  . ANEMIA 07/29/2010   Qualifier: Diagnosis of  By: Bobby Rumpf CMA (AAMA), [place]    . Angina pectoris ([place])   . Anxiety   . Asthma   . Asthma 01/07/2012  . Barrett's esophagus   . Bronchitis   . 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 Chest pain   . Coronary artery disease    right carotid occlusion   . Dementia with behavioral disturbance ([place]) 09/02/2016  . Depression   . Dysrhythmia    atrial fibrillation  . GERD (gastroesophageal reflux disease)   . 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 Hyperlipidemia   . Hypertension   . Mood disorder ([place])   . Paranoia ([place]) 10/03/2016  . Recurrent upper respiratory infection (URI)   . Stroke [facility])    2007, after left endarterectomy    Past Surgical History:  Procedure Laterality Date  . ABDOMINAL HYSTERECTOMY    . BALLOON DILATION  10/18/2012   Procedure: BALLOON DILATION;  Surgeon: Lear Ng, MD;  [facility];  Service: Endoscopy;  Laterality: N/A;  . CARDIAC CATHETERIZATION    . CAROTID ENDARTERECTOMY     left, complicated by stroke  . CHOLECYSTECTOMY     incidental at time of colectomy  . COLONOSCOPY  12/01/2011   Procedure: COLONOSCOPY;  Surgeon: Lear Ng, MD;  [facility];  Service: Endoscopy;  Laterality: N/A;  . COLONOSCOPY  10/18/2012   Procedure: COLONOSCOPY;  Surgeon: Lear Ng, MD;  [facility];  Service: Endoscopy;  Laterality: N/A;  colonic dilation  . ESOPHAGOGASTRODUODENOSCOPY  12/01/2011   Procedure: ESOPHAGOGASTRODUODENOSCOPY (EGD);  Surgeon: Lear Ng, MD;  [facility];  Service: Endoscopy;  Laterality: N/A;  . HOT HEMOSTASIS  12/01/2011   Procedure: HOT HEMOSTASIS (ARGON PLASMA COAGULATION/BICAP);  Surgeon: Lear Ng, MD;  Location: [facility];  Service: Endoscopy;  Laterality: N/A;  . 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 Always use your most recent med list.        alendronate 70 MG tablet Commonly known as:  FOSAMAX Take 70 mg by mouth every Wednesday. Take with a full glass of water on an empty stomach.   carvedilol 3.125 MG tablet Commonly known as:  COREG Take 1 tablet (3.125 mg total) by mouth 2 (two) times daily with a meal.   fluticasone 50 MCG/ACT nasal spray Commonly known  as:  FLONASE Place 1 spray into both nostrils daily.   isosorbide mononitrate 30 MG 24 hr tablet Commonly known as:  IMDUR Take 30 mg by mouth daily.   levothyroxine 25 MCG tablet Commonly known as:  SYNTHROID, LEVOTHROID Take 25 mcg by mouth daily before breakfast.   mirtazapine 15 MG tablet Commonly known as:  REMERON Take 7.5 mg by mouth at bedtime. 1/2 TAB   PRESERVISION AREDS 2 PO Take by mouth. TAKE 1 CAPSULE BY MOUTH TWICE A DAY   PROTONIX 40 mg/20 mL Pack Generic drug:  pantoprazole sodium Mix 1 packet on 1 teaspoonful of applesauce or 5 mL and take daily, DO NOT ADMINISTER WITH ANY  OTHER LIQUIDS OR FOODS   QUEtiapine 100 MG tablet Commonly known as:  SEROQUEL Take 100 mg by mouth 2 (two) times daily.   rivaroxaban 10 MG Tabs tablet Commonly known as:  XARELTO Take 15 mg by mouth daily. 1 and 1/2 tablet   sertraline 50 MG tablet Commonly known as:  ZOLOFT Take 50 mg by mouth. TAKE 1 AND 1/2 TABLETS (75MG ) BY MOUTH ONCE DAILY MOOD/DEPRESSION   sertraline 25 MG tablet Commonly known as:  ZOLOFT Take 25 mg by mouth daily. Take 1 tablet, along with Sertraline 50 mg to equal 75mg , by mouth daily   simvastatin 10 MG tablet Commonly known as:  ZOCOR Take 10 mg by mouth at bedtime.   tiotropium 18 MCG inhalation capsule Commonly known as:  SPIRIVA Place 18 mcg into inhaler and inhale daily.   traZODone 100 MG tablet Commonly known as:  DESYREL Take 200 mg by mouth at bedtime.   Vitamin D (Ergocalciferol) 1.25 MG (50000 UT) Caps capsule Commonly known as:  DRISDOL Take 50,000 Units by mouth. TAKE 1 CAPSULE BY MOUTH ONCE A MONTH FOR VITAMIN D DEF (DO NOT CRUSH)       No orders of the defined types were placed in this encounter.   Immunization History  Administered Date(s) Administered  . Influenza-Unspecified 08/26/2016, 10/21/2017  . PPD Test 08/16/2016  . Pneumococcal Polysaccharide-23 01/08/2012    Social History   Tobacco Use  . Smoking status: Former Smoker    Last attempt to quit: 04/09/2003    Years since quitting: 15.7  . Smokeless tobacco: Never Used  Substance Use Topics  . Alcohol use: No    Review of Systems  DATA OBTAINED: from patient, nurse GENERAL:  no fevers, fatigue, appetite changes SKIN: No itching, rash HEENT: No complaint RESPIRATORY: No cough, wheezing, SOB CARDIAC: No chest pain, palpitations, lower extremity edema  GI: No abdominal pain, No N/V/D or constipation, No heartburn or reflux  GU: No dysuria, frequency or urgency, or incontinence  MUSCULOSKELETAL: No unrelieved bone/joint pain NEUROLOGIC: No headache,  dizziness  PSYCHIATRIC: No overt anxiety or sadness  Vitals:   01/08/19 1003  BP: (!) 149/86  Pulse: 63  Resp: 18  Temp: (!) 97.5 F (36.4 C)   Body mass index is 27.42 kg/m. Physical Exam  GENERAL APPEARANCE: Alert, conversant, No acute distress  SKIN: No diaphoresis rash HEENT: Unremarkable RESPIRATORY: Breathing is even, unlabored. Lung sounds are clear   CARDIOVASCULAR: Heart RRR no murmurs, rubs or gallops. No peripheral edema  GASTROINTESTINAL: Abdomen is soft, non-tender, not distended w/ normal bowel sounds.  GENITOURINARY: Bladder non tender, not distended  MUSCULOSKELETAL: No abnormal joints or musculature NEUROLOGIC: Cranial nerves 2-12 grossly intact. Moves all extremities PSYCHIATRIC: Mood and affect appropriate to situation, she has been mentally stable for quite a while;  no behavioral issues  Patient Active Problem List   Diagnosis Date Noted  . Macular degeneration 12/09/2018  . Psychoses ([place]) 12/19/2017  . Insomnia 12/19/2017  . Urinary tract infection due to extended-spectrum beta lactamase (ESBL) producing Escherichia coli 11/17/2017  . Dysphagia, oropharyngeal 11/15/2017  . Altered mental status 11/10/2017  . Acute metabolic encephalopathy 72/53/6644  . Mania ([place]) 06/21/2017  . Hypomagnesemia 06/19/2017  . Essential hypertension 06/18/2017  . Acute encephalopathy 06/17/2017  . Acute lower UTI 06/17/2017  . E. coli UTI 06/17/2017  . Depression 04/24/2017  . Hypothyroidism 10/03/2016  . Paranoia ([place]) 10/03/2016  . LOC (loss of consciousness) ([place]) 09/02/2016  . 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 Dementia with behavioral disturbance ([place]) 09/02/2016  . Asthma with acute exacerbation 08/21/2016  . Atrial fibrillation with RVR ([place]) 08/21/2016  . Elevated INR 08/21/2016  . Chronic systolic heart failure ([place]) 08/21/2016  . Hyperlipidemia 08/21/2016  . Electrolyte abnormality 08/21/2016  . HCAP (healthcare-associated pneumonia)  08/10/2016  . Sepsis ([place]) 08/09/2016  . Coronary atherosclerosis of native coronary artery 01/21/2014  . Obesity 01/17/2014  . Abdominal pain, generalized 10/18/2012  . GERD (gastroesophageal reflux disease)   . Mood disorder ([place])   . Anxiety   . 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[place])   . Stroke ([place])   . Chest pain 01/07/2012  . Asthma 01/07/2012  . A-fib ([place]) 01/07/2012  . 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 Anemia 01/07/2012  . ANEMIA 07/29/2010  . BARRETTS ESOPHAGUS 06/04/2010    CMP     Component Value Date/Time   NA 141 12/11/2018   K 4.5 12/11/2018   CL 103 04/29/2018 0938   CO2 29 04/29/2018 0938   GLUCOSE 102 (H) 04/29/2018 0938   BUN 31 (A) 12/11/2018   CREATININE 1.2 (A) 12/11/2018   CREATININE 1.00 04/29/2018 0938   CALCIUM 9.6 04/29/2018 0938   PROT 6.9 11/08/2017 1512   ALBUMIN 3.8 11/08/2017 1512   AST 11 (A) 12/11/2018   ALT 6 (A) 12/11/2018   ALKPHOS 61 12/11/2018   BILITOT 0.9 11/08/2017 1512   GFRNONAA 53 (L) 04/29/2018 0938   GFRAA >60 04/29/2018 0938   Recent Labs    04/29/18 0938 09/06/18 12/11/18  NA 140 140 141  K 4.7 4.0 4.5  CL 103  --   --   CO2 29  --   --   GLUCOSE 102*  --   --   BUN 9 22* 31*  CREATININE 1.00 1.0 1.2*  CALCIUM 9.6  --   --    Recent Labs    09/06/18 12/11/18  AST 15 11*  ALT 11 6*  ALKPHOS 65 61   Recent Labs    04/29/18 0938 09/06/18 12/11/18  WBC 6.6 6.8 4.8  NEUTROABS 5.0  --   --   HGB 13.4 13.7 12.2  HCT 42.8 40 36  MCV 84.9  --   --   PLT 150 208 153   Recent Labs    12/11/18  CHOL 130  LDLCALC 64  TRIG 84   No results found for: [facility] Lab Results  Component Value Date   TSH 2.50 12/11/2018   Lab Results  Component Value Date   HGBA1C 5.7 12/11/2018   Lab Results  Component Value Date   CHOL 130 12/11/2018   HDL 49 12/11/2018   LDLCALC 64 12/11/2018   TRIG 84 12/11/2018   CHOLHDL 1.9 06/19/2017    Significant Diagnostic  Results in last 30 days:  No results found.  Assessment and Plan  A-fib ([place]) Stable; continue Coreg 3.125 mg twice daily for rate control and Xarelto 15 mg daily as prophylaxis  Coronary atherosclerosis of native coronary artery Continues without complaints of chest pain; continue Coreg 3.125 mg twice daily, Imdur 30 mg daily Xarelto 15 mg daily; patient is on statin  Dysphagia, oropharyngeal Patient has been without pneumonias; continue current diet and continue supportive care     Juanice Warburton D. [name], MD

## 2019-01-09 ENCOUNTER — Encounter: Payer: Self-pay | Admitting: Internal Medicine

## 2019-01-09 NOTE — Assessment & Plan Note (Signed)
Continues without complaints of chest pain; continue Coreg 3.125 mg twice daily, Imdur 30 mg daily Xarelto 15 mg daily; patient is on statin

## 2019-01-09 NOTE — Assessment & Plan Note (Signed)
Stable; continue Coreg 3.125 mg twice daily for rate control and Xarelto 15 mg daily as prophylaxis

## 2019-01-09 NOTE — Assessment & Plan Note (Signed)
Patient has been without pneumonias; continue current diet and continue supportive care

## 2019-02-06 ENCOUNTER — Encounter: Payer: Self-pay | Admitting: Internal Medicine

## 2019-02-06 ENCOUNTER — Non-Acute Institutional Stay (SKILLED_NURSING_FACILITY): Payer: Medicare Other | Admitting: Internal Medicine

## 2019-02-06 DIAGNOSIS — I110 Hypertensive heart disease with heart failure: Secondary | ICD-10-CM | POA: Diagnosis not present

## 2019-02-06 DIAGNOSIS — K219 Gastro-esophageal reflux disease without esophagitis: Secondary | ICD-10-CM

## 2019-02-06 DIAGNOSIS — I5022 Chronic systolic (congestive) heart failure: Secondary | ICD-10-CM | POA: Diagnosis not present

## 2019-02-06 NOTE — Progress Notes (Signed)
Location:  [place] Room Number: [phone] Place of Service:  SNF [phone])  Noah Delaine. [name], MD  Patient Care Team: [name], MD as PCP - General (Internal Medicine)  Extended Emergency Contact Information Primary Emergency Contact: Tyner,[name] Address: [address] [place], [place] [address] [name] Work Phone: [phone] Mobile Phone: [phone] Relation: Daughter Secondary Emergency Contact: Lorien, [place] Mobile Phone: [phone] Relation: Son    Allergies: Patient has no known allergies.  Chief Complaint  Patient presents with  . Medical Management of Chronic Issues    Routine visit    HPI: Patient is 78 y.o. female who is being seen for routine issues of hypertension, congestive heart failure, and GERD.  Past Medical History:  Diagnosis Date  . A-fib ([place]) 01/07/2012  . ANEMIA 07/29/2010   Qualifier: Diagnosis of  By: Bobby Rumpf CMA (AAMA), Patty    . Angina pectoris ([place])   . Anxiety   . Asthma   . Asthma 01/07/2012  . Barrett's esophagus   . Bronchitis   . 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 Chest pain   . Coronary artery disease    right carotid occlusion   . Dementia with behavioral disturbance ([place]) 09/02/2016  . Depression   . Dysrhythmia    atrial fibrillation  . GERD (gastroesophageal reflux disease)   . 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 Hyperlipidemia   . Hypertension   . Mood disorder ([place])   . Paranoia ([place]) 10/03/2016  . Recurrent upper respiratory infection (URI)   . Stroke [facility])    2007, after left endarterectomy    Past Surgical History:  Procedure Laterality Date  . ABDOMINAL HYSTERECTOMY    . BALLOON DILATION  10/18/2012   Procedure: BALLOON DILATION;  Surgeon: Lear Ng, MD;  [facility];  Service: Endoscopy;  Laterality: N/A;  . CARDIAC CATHETERIZATION    . CAROTID ENDARTERECTOMY     left, complicated by stroke  . CHOLECYSTECTOMY     incidental  at time of colectomy  . COLONOSCOPY  12/01/2011   Procedure: COLONOSCOPY;  Surgeon: Lear Ng, MD;  [facility];  Service: Endoscopy;  Laterality: N/A;  . COLONOSCOPY  10/18/2012   Procedure: COLONOSCOPY;  Surgeon: Lear Ng, MD;  [facility];  Service: Endoscopy;  Laterality: N/A;  colonic dilation  . ESOPHAGOGASTRODUODENOSCOPY  12/01/2011   Procedure: ESOPHAGOGASTRODUODENOSCOPY (EGD);  Surgeon: Lear Ng, MD;  [facility];  Service: Endoscopy;  Laterality: N/A;  . HOT HEMOSTASIS  12/01/2011   Procedure: HOT HEMOSTASIS (ARGON PLASMA COAGULATION/BICAP);  Surgeon: Lear Ng, MD;  Location[facility];  Service: Endoscopy;  Laterality: N/A;  . 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 Always use your most recent med list.        alendronate 70 MG tablet Commonly known as:  FOSAMAX Take 70 mg by mouth every Wednesday. Take with a full glass of water on an empty stomach.   carvedilol 3.125 MG tablet Commonly known as:  COREG Take 1 tablet (3.125 mg total) by mouth 2 (two) times daily with a meal.   fluticasone 50 MCG/ACT nasal spray Commonly known  as:  FLONASE Place 1 spray into both nostrils daily.   isosorbide mononitrate 30 MG 24 hr tablet Commonly known as:  IMDUR Take 30 mg by mouth daily.   levothyroxine 25 MCG tablet Commonly known as:  SYNTHROID, LEVOTHROID Take 25 mcg by mouth daily before breakfast.   mirtazapine 15 MG tablet Commonly known as:  REMERON Take 7.5 mg by mouth at bedtime. 1/2 TAB   PRESERVISION AREDS 2 PO Take by mouth. TAKE 1 CAPSULE BY MOUTH TWICE A DAY   Protonix 40 mg/20 mL Pack Generic drug:  pantoprazole sodium Mix 1 packet on 1 teaspoonful of applesauce or 5 mL and take daily, DO NOT ADMINISTER WITH ANY OTHER LIQUIDS  OR FOODS   QUEtiapine 100 MG tablet Commonly known as:  SEROQUEL Take 100 mg by mouth 2 (two) times daily.   rivaroxaban 10 MG Tabs tablet Commonly known as:  XARELTO Take 15 mg by mouth daily. 1 and 1/2 tablet   sertraline 50 MG tablet Commonly known as:  ZOLOFT Take 50 mg by mouth. TAKE 1 AND 1/2 TABLETS (75MG ) BY MOUTH ONCE DAILY MOOD/DEPRESSION   sertraline 25 MG tablet Commonly known as:  ZOLOFT Take 25 mg by mouth daily. Take 1 tablet, along with Sertraline 50 mg to equal 75mg , by mouth daily   simvastatin 10 MG tablet Commonly known as:  ZOCOR Take 10 mg by mouth at bedtime.   tiotropium 18 MCG inhalation capsule Commonly known as:  SPIRIVA Place 18 mcg into inhaler and inhale daily.   traZODone 100 MG tablet Commonly known as:  DESYREL Take 200 mg by mouth at bedtime.   Vitamin D (Ergocalciferol) 1.25 MG (50000 UT) Caps capsule Commonly known as:  DRISDOL Take 50,000 Units by mouth. TAKE 1 CAPSULE BY MOUTH ONCE A MONTH FOR VITAMIN D DEF (DO NOT CRUSH)       No orders of the defined types were placed in this encounter.   Immunization History  Administered Date(s) Administered  . Influenza-Unspecified 08/26/2016, 10/21/2017  . PPD Test 08/16/2016  . Pneumococcal Polysaccharide-23 01/08/2012    Social History   Tobacco Use  . Smoking status: Former Smoker    Last attempt to quit: 04/09/2003    Years since quitting: 15.8  . Smokeless tobacco: Never Used  Substance Use Topics  . Alcohol use: No    Review of Systems  DATA OBTAINED: from patient, nurse GENERAL:  no fevers, fatigue, appetite changes SKIN: No itching, rash HEENT: No complaint RESPIRATORY: No cough, wheezing, SOB CARDIAC: No chest pain, palpitations, lower extremity edema  GI: No abdominal pain, No N/V/D or constipation, No heartburn or reflux  GU: No dysuria, frequency or urgency, or incontinence  MUSCULOSKELETAL: No unrelieved bone/joint pain NEUROLOGIC: No headache, dizziness    PSYCHIATRIC: No overt anxiety or sadness  Vitals:   02/06/19 1306  BP: (!) 138/96  Pulse: 76  Resp: 18  Temp: 98.4 F (36.9 C)   Body mass index is 27.34 kg/m. Physical Exam  GENERAL APPEARANCE: Alert, conversant, No acute distress  SKIN: No diaphoresis rash HEENT: Unremarkable RESPIRATORY: Breathing is even, unlabored. Lung sounds are clear   CARDIOVASCULAR: Heart RRR no murmurs, rubs or gallops. No peripheral edema  GASTROINTESTINAL: Abdomen is soft, non-tender, not distended w/ normal bowel sounds.  GENITOURINARY: Bladder non tender, not distended  MUSCULOSKELETAL: No abnormal joints or musculature NEUROLOGIC: Cranial nerves 2-12 grossly intact. Moves all extremities PSYCHIATRIC: Mood and affect appropriate to situation, no behavioral issues  Patient Active Problem List  Diagnosis Date Noted  . Macular degeneration 12/09/2018  . 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 Insomnia 12/19/2017  . Urinary tract infection due to extended-spectrum beta lactamase (ESBL) producing Escherichia coli 11/17/2017  . Dysphagia, oropharyngeal 11/15/2017  . Altered mental status 11/10/2017  . Acute metabolic encephalopathy 13/06/6577  . Mania ([place]) 06/21/2017  . Hypomagnesemia 06/19/2017  . Essential hypertension 06/18/2017  . Acute encephalopathy 06/17/2017  . Acute lower UTI 06/17/2017  . E. coli UTI 06/17/2017  . Depression 04/24/2017  . Hypothyroidism 10/03/2016  . Paranoia ([place]) 10/03/2016  . LOC (loss of consciousness) ([place]) 09/02/2016  . 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 Dementia with behavioral disturbance ([place]) 09/02/2016  . Asthma with acute exacerbation 08/21/2016  . Atrial fibrillation with RVR ([place]) 08/21/2016  . Elevated INR 08/21/2016  . Chronic systolic heart failure ([place]) 08/21/2016  . Hyperlipidemia 08/21/2016  . Electrolyte abnormality 08/21/2016  . HCAP (healthcare-associated pneumonia) 08/10/2016  . Sepsis ([place]) 08/09/2016  . Coronary atherosclerosis  of native coronary artery 01/21/2014  . Obesity 01/17/2014  . Abdominal pain, generalized 10/18/2012  . GERD (gastroesophageal reflux disease)   . Mood disorder ([place])   . Anxiety   . 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[place])   . Stroke ([place])   . Chest pain 01/07/2012  . Asthma 01/07/2012  . A-fib ([place]) 01/07/2012  . 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 Anemia 01/07/2012  . ANEMIA 07/29/2010  . BARRETTS ESOPHAGUS 06/04/2010    CMP     Component Value Date/Time   NA 141 12/11/2018   K 4.5 12/11/2018   CL 103 04/29/2018 0938   CO2 29 04/29/2018 0938   GLUCOSE 102 (H) 04/29/2018 0938   BUN 31 (A) 12/11/2018   CREATININE 1.2 (A) 12/11/2018   CREATININE 1.00 04/29/2018 0938   CALCIUM 9.6 04/29/2018 0938   PROT 6.9 11/08/2017 1512   ALBUMIN 3.8 11/08/2017 1512   AST 11 (A) 12/11/2018   ALT 6 (A) 12/11/2018   ALKPHOS 61 12/11/2018   BILITOT 0.9 11/08/2017 1512   GFRNONAA 53 (L) 04/29/2018 0938   GFRAA >60 04/29/2018 0938   Recent Labs    04/29/18 0938 09/06/18 12/11/18  NA 140 140 141  K 4.7 4.0 4.5  CL 103  --   --   CO2 29  --   --   GLUCOSE 102*  --   --   BUN 9 22* 31*  CREATININE 1.00 1.0 1.2*  CALCIUM 9.6  --   --    Recent Labs    09/06/18 12/11/18  AST 15 11*  ALT 11 6*  ALKPHOS 65 61   Recent Labs    04/29/18 0938 09/06/18 12/11/18  WBC 6.6 6.8 4.8  NEUTROABS 5.0  --   --   HGB 13.4 13.7 12.2  HCT 42.8 40 36  MCV 84.9  --   --   PLT 150 208 153   Recent Labs    12/11/18  CHOL 130  LDLCALC 64  TRIG 84   No results found for: [facility] Lab Results  Component Value Date   TSH 2.50 12/11/2018   Lab Results  Component Value Date   HGBA1C 5.7 12/11/2018   Lab Results  Component Value Date   CHOL 130 12/11/2018   HDL 49 12/11/2018   LDLCALC 64 12/11/2018   TRIG 84 12/11/2018   CHOLHDL 1.9 06/19/2017    Significant Diagnostic Results in last 30 days:  No results found.  Assessment and  Plan  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HCC) Appears controlled; continue Imdur 30 mg daily and Coreg 3.125 mg twice daily  Chronic systolic heart failure (HCC) No report of exacerbation; continue Coreg 3.125 mg twice daily; patient is not on diuretic  GERD (gastroesophageal reflux disease) No complaints of reflux, or reports of reflux; continue Protonix 40 mg daily      D. [name], MD

## 2019-02-10 ENCOUNTER — Encounter: Payer: Self-pay | Admitting: Internal Medicine

## 2019-02-10 NOTE — Assessment & Plan Note (Signed)
No complaints of reflux, or reports of reflux; continue Protonix 40 mg daily

## 2019-02-10 NOTE — Assessment & Plan Note (Signed)
No report of exacerbation; continue Coreg 3.125 mg twice daily; patient is not on diuretic

## 2019-02-10 NOTE — Assessment & Plan Note (Signed)
Appears controlled; continue Imdur 30 mg daily and Coreg 3.125 mg twice daily

## 2019-02-27 ENCOUNTER — Encounter: Payer: Self-pay | Admitting: Adult Health

## 2019-02-27 ENCOUNTER — Non-Acute Institutional Stay (SKILLED_NURSING_FACILITY): Payer: Medicare Other | Admitting: Adult Health

## 2019-02-27 DIAGNOSIS — I110 Hypertensive heart disease with heart failure: Secondary | ICD-10-CM | POA: Diagnosis not present

## 2019-02-27 DIAGNOSIS — I5022 Chronic systolic (congestive) heart failure: Secondary | ICD-10-CM

## 2019-02-27 NOTE — Progress Notes (Signed)
Location:    [place] Room Number: [phone] Place of Service:  SNF (31)   CODE STATUS: 305W  No Known Allergies  Chief Complaint  Patient presents with   Acute Visit    Blood pressure    HPI:  Her blood pressure readings have been elevated. There are no reports of headaches; uncontrolled pain; no changes in appetite. No reports of fevers present.    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   Chest pain    Coronary artery disease    right carotid occlusion    Dementia with behavioral disturbance ([place]) 09/02/2016   Depression    Dysrhythmia    atrial fibrillation   GERD (gastroesophageal reflux disease)    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   Hyperlipidemia    Hypertension    Mood disorder ([place])    Paranoia ([place]) 10/03/2016   Recurrent upper respiratory infection (URI)    Stroke ([place])    2007, after left endarterectomy    Past Surgical History:  Procedure Laterality Date   ABDOMINAL HYSTERECTOMY     BALLOON DILATION  10/18/2012   Procedure: BALLOON DILATION;  Surgeon: Lear Ng, MD;  Location: [facility];  Service: Endoscopy;  Laterality: N/A;   CARDIAC CATHETERIZATION     CAROTID ENDARTERECTOMY     left, complicated by stroke   CHOLECYSTECTOMY     incidental at time of colectomy   COLONOSCOPY  12/01/2011   Procedure: COLONOSCOPY;  Surgeon: Lear Ng, MD;  Location: [facility];  Service: Endoscopy;  Laterality: N/A;   COLONOSCOPY  10/18/2012   Procedure: COLONOSCOPY;  Surgeon: Lear Ng, MD;  [facility]: [facility];  Service: Endoscopy;  Laterality: N/A;  colonic dilation   ESOPHAGOGASTRODUODENOSCOPY  12/01/2011   Procedure:  ESOPHAGOGASTRODUODENOSCOPY (EGD);  Surgeon: Lear Ng, MD;  Location: [facility];  Service: Endoscopy;  Laterality: N/A;   HOT HEMOSTASIS  12/01/2011   Procedure: HOT HEMOSTASIS (ARGON PLASMA COAGULATION/BICAP);  Surgeon: Lear Ng, MD;  Location: [facility];  Service: Endoscopy;  Laterality: N/A;   PARTIAL COLECTOMY     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    Social History   Socioeconomic History   Marital status: Divorced    Spouse name: Not on file   Number of children: Not on file   Years of education: Not on file   Highest education level: Not on file  Occupational History   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 at all   Food insecurity:    Worry: Never true    Inability: Never true   Transportation needs:    Medical: No    Non-medical: No  Tobacco Use   Smoking status: Former Smoker    Last attempt to quit: 04/09/2003    Years since quitting: 15.8   Smokeless tobacco: Never Used  Substance and Sexual Activity   Alcohol use: No   Drug use: No   Sexual activity: Never  Lifestyle   Physical activity:    Days per week: 0 days    Minutes per session: 0 min  Stress: Not at all  Relationships   Social connections:    Talks on phone: Once a week    Gets together: Once a week    Attends religious service: Never    Active member of club or organization: No    Attends meetings of clubs or organizations: Never    Relationship status: Divorced   Intimate partner violence:    Fear of current or ex partner: No    Emotionally abused: No    Physically abused: No    Forced sexual activity: No  Other Topics Concern   Not on file  Social History Narrative   Admitted to [facility] 08/16/16   Divorced   Former smoker-stopped 2004   Alcohol none   DNR   Family History  Problem Relation Age of Onset   Heart disease Mother    Cancer Mother    Heart attack Father     Cancer Sister        ovarian      VITAL SIGNS BP (!) 131/96    Pulse 69    Temp (!) 96.6 F (35.9 C)    Resp 19    Ht 5' (1.524 m)    Wt 148 lb (67.1 kg)    BMI 28.90 kg/m   Outpatient Encounter Medications as of 02/27/2019  Medication Sig   alendronate (FOSAMAX) 70 MG tablet Take 70 mg by mouth every Wednesday. Take with a full glass of water on an empty stomach.   carvedilol (COREG) 3.125 MG tablet Take 1 tablet (3.125 mg total) by mouth 2 (two) times daily with a meal.   fluticasone (FLONASE) 50 MCG/ACT nasal spray Place 1 spray into both nostrils daily.    isosorbide mononitrate (IMDUR) 30 MG 24 hr tablet Take 30 mg by mouth daily.   levothyroxine (SYNTHROID, LEVOTHROID) 25 MCG tablet Take 25 mcg by mouth daily before breakfast.    mirtazapine (REMERON) 15 MG tablet Take 7.5 mg by mouth at bedtime. 1/2 TAB   Multiple Vitamins-Minerals (PRESERVISION AREDS 2 PO) Take by mouth. TAKE 1 CAPSULE BY MOUTH TWICE A DAY   pantoprazole sodium (PROTONIX) 40 mg/20 mL PACK Mix 1 packet on 1 teaspoonful of applesauce or 5 mL and take daily, DO NOT ADMINISTER WITH ANY OTHER LIQUIDS OR FOODS   QUEtiapine (SEROQUEL) 100 MG tablet Take 100 mg by mouth 2 (two) times daily.    rivaroxaban (XARELTO) 10 MG TABS tablet Take 15 mg by mouth daily. 1 and 1/2 tablet   sertraline (ZOLOFT) 25 MG tablet Take 25 mg by mouth daily. Take 1 tablet, along with Sertraline 50 mg to equal 75mg , by mouth daily   sertraline (ZOLOFT) 50 MG tablet Take 50 mg by mouth. TAKE 1 AND 1/2 TABLETS (75MG ) BY MOUTH ONCE DAILY MOOD/DEPRESSION   simvastatin (ZOCOR) 10 MG tablet Take 10 mg by mouth at bedtime.   tiotropium (SPIRIVA) 18 MCG inhalation capsule Place 18 mcg into inhaler and inhale daily.   traZODone (DESYREL) 100 MG tablet Take 200 mg by mouth at bedtime.    Vitamin D, Ergocalciferol, (DRISDOL) 1.25 MG (50000 UT) CAPS capsule Take 50,000 Units by mouth. TAKE 1 CAPSULE BY MOUTH ONCE A MONTH FOR VITAMIN D  DEF (DO NOT CRUSH)   No facility-administered encounter medications on file as of 02/27/2019.      SIGNIFICANT DIAGNOSTIC EXAMS  LABS REVIEWED TODAY:   12-11-18: wbc 4.8; hgb 12.2; hct 36; plt 153; glucose 79; bun 31; creat 1.2; k+ 4.5; na++ 141;  liver normal chol 130; ldl 64; trig 84; hdl 40; hgb a1c 5.7 tsh  2.50 12-21-18: wbc 5.0; hgb 11.7; hct 37; plt 162   Review of Systems  Unable to perform ROS: Dementia (unable to participate )    Physical Exam Constitutional:      General: She is not in acute distress.    Appearance: She is well-developed. She is not diaphoretic.  Neck:     Musculoskeletal: Neck supple.     Thyroid: No thyromegaly.  Cardiovascular:     Rate and Rhythm: Normal rate and regular rhythm.     Pulses: Normal pulses.     Heart sounds: Normal heart sounds.  Pulmonary:     Effort: Pulmonary effort is normal. No respiratory distress.     Breath sounds: Normal breath sounds.  Abdominal:     General: Bowel sounds are normal. There is no distension.     Palpations: Abdomen is soft.     Tenderness: There is no abdominal tenderness.  Musculoskeletal:     Right lower leg: No edema.     Left lower leg: No edema.     Comments: Is able to move all extremities   Lymphadenopathy:     Cervical: No cervical adenopathy.  Skin:    General: Skin is warm and dry.  Neurological:     Mental Status: She is alert. Mental status is at baseline.  Psychiatric:        Mood and Affect: Mood normal.        ASSESSMENT/ PLAN:  TODAY:   1. Hypertensive heart disease with chronic systolic congestive heart failure: is stable 131/96: will begin lisinopril 2.5 mg daily and will monitor her status.        MD is aware of resident's narcotic use and is in agreement with current plan of care. We will attempt to wean resident as apropriate   Ok Edwards NP [facility]  Contact [phone] Monday through Friday 8am- 5pm  After hours call [phone]

## 2019-02-28 ENCOUNTER — Non-Acute Institutional Stay (SKILLED_NURSING_FACILITY): Payer: Medicare Other | Admitting: Adult Health

## 2019-02-28 ENCOUNTER — Encounter: Payer: Self-pay | Admitting: Adult Health

## 2019-02-28 DIAGNOSIS — J4540 Moderate persistent asthma, uncomplicated: Secondary | ICD-10-CM

## 2019-02-28 NOTE — Progress Notes (Signed)
Location:   [place] Room Number: [phone] Place of Service:  SNF (31)   CODE STATUS: DNR  No Known Allergies  Chief Complaint  Patient presents with  . Acute Visit    Medication Refusal     HPI:  She has been declining her inhalers spiriva and flonase on a regular basis. She is taking her other medications. There are no reports of cough or shortness of breath. No reports of fevers present. No reports of agitation present.   Past Medical History:  Diagnosis Date  . A-fib ([place]) 01/07/2012  . ANEMIA 07/29/2010   Qualifier: Diagnosis of  By: Bobby Rumpf CMA (AAMA), Patty    . Angina pectoris ([place])   . Anxiety   . Asthma   . Asthma 01/07/2012  . Barrett's esophagus   . Bronchitis   . 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 Chest pain   . Coronary artery disease    right carotid occlusion   . Dementia with behavioral disturbance ([place]) 09/02/2016  . Depression   . Dysrhythmia    atrial fibrillation  . GERD (gastroesophageal reflux disease)   . 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 Hyperlipidemia   . Hypertension   . Mood disorder ([place])   . Paranoia ([place]) 10/03/2016  . Recurrent upper respiratory infection (URI)   . Stroke [facility])    2007, after left endarterectomy    Past Surgical History:  Procedure Laterality Date  . ABDOMINAL HYSTERECTOMY    . BALLOON DILATION  10/18/2012   Procedure: BALLOON DILATION;  Surgeon: Lear Ng, MD;  [facility];  Service: Endoscopy;  Laterality: N/A;  . CARDIAC CATHETERIZATION    . CAROTID ENDARTERECTOMY     left, complicated by stroke  . CHOLECYSTECTOMY     incidental at time of colectomy  . COLONOSCOPY  12/01/2011   Procedure: COLONOSCOPY;  Surgeon: Lear Ng, MD;  Location: [facility];  Service: Endoscopy;  Laterality: N/A;  . COLONOSCOPY  10/18/2012   Procedure: COLONOSCOPY;  Surgeon: Lear Ng, MD;  [facility]: [facility];  Service: Endoscopy;  Laterality: N/A;  colonic  dilation  . ESOPHAGOGASTRODUODENOSCOPY  12/01/2011   Procedure: ESOPHAGOGASTRODUODENOSCOPY (EGD);  Surgeon: Lear Ng, MD;  Location: [facility];  Service: Endoscopy;  Laterality: N/A;  . HOT HEMOSTASIS  12/01/2011   Procedure: HOT HEMOSTASIS (ARGON PLASMA COAGULATION/BICAP);  Surgeon: Lear Ng, MD;  Location: [facility];  Service: Endoscopy;  Laterality: N/A;  . 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 Marital status: Divorced    Spouse name: Not on file  . Number of children: Not on file  . Years of education: Not on file  . Highest education level: Not on file  Occupational History  . Occupation: retired Tour manager  . Financial resource strain: Not hard at all  . Food insecurity:    Worry: Never true    Inability: Never true  . Transportation needs:    Medical: No    Non-medical: No  Tobacco Use  . Smoking status: Former Smoker    Last attempt to quit: 04/09/2003    Years since quitting: 15.9  . Smokeless tobacco: Never Used  Substance and Sexual Activity  . Alcohol use: No  . Drug use: No  . Sexual activity: Never  Lifestyle  . Physical activity:  Days per week: 0 days    Minutes per session: 0 min  . Stress: Not at all  Relationships  . Social connections:    Talks on phone: Once a week    Gets together: Once a week    Attends religious service: Never    Active member of club or organization: No    Attends meetings of clubs or organizations: Never    Relationship status: Divorced  . Intimate partner violence:    Fear of current or ex partner: No    Emotionally abused: No    Physically abused: No    Forced sexual activity: No  Other Topics Concern  . Not on file  Social History Narrative   Admitted to [facility] 08/16/16   Divorced   Former smoker-stopped 2004   Alcohol none   DNR   Family History  Problem Relation Age of Onset  .  Heart disease Mother   . Cancer Mother   . Heart attack Father   . Cancer Sister        ovarian      VITAL SIGNS BP (!) 158/86   Pulse 73   Temp (!) 97.1 F (36.2 C) (Oral)   Resp 18   Ht 5' (1.524 m)   Wt 148 lb (67.1 kg)   BMI 28.90 kg/m   Outpatient Encounter Medications as of 02/28/2019  Medication Sig  . alendronate (FOSAMAX) 70 MG tablet Take 70 mg by mouth every Wednesday. Take with a full glass of water on an empty stomach.  . carvedilol (COREG) 3.125 MG tablet Take 1 tablet (3.125 mg total) by mouth 2 (two) times daily with a meal.  . Ensure (ENSURE) Take 237 mLs by mouth 2 (two) times daily between meals. Due to weight loss  . fluticasone (FLONASE) 50 MCG/ACT nasal spray Place 1 spray into both nostrils daily.   . isosorbide mononitrate (IMDUR) 30 MG 24 hr tablet Take 30 mg by mouth daily.  [place] Kitchen levothyroxine (SYNTHROID, LEVOTHROID) 25 MCG tablet Take 25 mcg by mouth daily before breakfast.   . lisinopril (PRINIVIL,ZESTRIL) 2.5 MG tablet Take 2.5 mg by mouth daily.  . mirtazapine (REMERON) 15 MG tablet Take 7.5 mg by mouth at bedtime. 1/2 TAB  . Multiple Vitamins-Minerals (PRESERVISION AREDS 2 PO) Take by mouth. TAKE 1 CAPSULE BY MOUTH TWICE A DAY  . pantoprazole sodium (PROTONIX) 40 mg/20 mL PACK Mix 1 packet on 1 teaspoonful of applesauce or 5 mL and take daily, DO NOT ADMINISTER WITH ANY OTHER LIQUIDS OR FOODS  . QUEtiapine (SEROQUEL) 100 MG tablet Take 100 mg by mouth 2 (two) times daily.   . rivaroxaban (XARELTO) 10 MG TABS tablet Take 15 mg by mouth daily. 1 and 1/2 tablet  . sertraline (ZOLOFT) 25 MG tablet Take 25 mg by mouth daily. Take 1 tablet, along with Sertraline 50 mg to equal 75mg , by mouth daily  . sertraline (ZOLOFT) 50 MG tablet Take 50 mg by mouth. TAKE 1 AND 1/2 TABLETS (75MG ) BY MOUTH ONCE DAILY MOOD/DEPRESSION  . simvastatin (ZOCOR) 10 MG tablet Take 10 mg by mouth at bedtime.  [place] Kitchen tiotropium (SPIRIVA) 18 MCG inhalation capsule Place 18 mcg into inhaler  and inhale daily.  . traZODone (DESYREL) 100 MG tablet Take 200 mg by mouth at bedtime.   . Vitamin D, Ergocalciferol, (DRISDOL) 1.25 MG (50000 UT) CAPS capsule Take 50,000 Units by mouth. TAKE 1 CAPSULE BY MOUTH ONCE A MONTH FOR VITAMIN D DEF (DO NOT CRUSH)  No facility-administered encounter medications on file as of 02/28/2019.      SIGNIFICANT DIAGNOSTIC EXAMS   LABS REVIEWED PREVIOUS:   12-11-18: wbc 4.8; hgb 12.2; hct 36; plt 153; glucose 79; bun 31; creat 1.2; k+ 4.5; na++ 141; liver normal chol 130; ldl 64; trig 84; hdl 40; hgb a1c 5.7 tsh  2.50 12-21-18: wbc 5.0; hgb 11.7; hct 37; plt 162  NO NEW LABS  Review of Systems  Unable to perform ROS: Dementia (unable to participate )    Physical Exam Constitutional:      General: She is not in acute distress.    Appearance: She is well-developed. She is not diaphoretic.  Neck:     Musculoskeletal: Neck supple.     Thyroid: No thyromegaly.  Cardiovascular:     Rate and Rhythm: Normal rate and regular rhythm.     Pulses: Normal pulses.     Heart sounds: Normal heart sounds.  Pulmonary:     Effort: Pulmonary effort is normal. No respiratory distress.     Breath sounds: Normal breath sounds.  Abdominal:     General: Bowel sounds are normal. There is no distension.     Palpations: Abdomen is soft.     Tenderness: There is no abdominal tenderness.  Musculoskeletal:     Right lower leg: No edema.     Left lower leg: No edema.     Comments: Is able to move all extremities   Lymphadenopathy:     Cervical: No cervical adenopathy.  Skin:    General: Skin is warm and dry.  Neurological:     Mental Status: She is alert. Mental status is at baseline.  Psychiatric:        Mood and Affect: Mood normal.     ASSESSMENT/ PLAN:  TODAY:   1. Moderate persistent asthma without complication: is stable will stop spiriva and flonase at this time due to her declining these medications and will monitor her status.   MD is aware of  resident's narcotic use and is in agreement with current plan of care. We will attempt to wean resident as apropriate   Ok Edwards NP [facility] Adult [facility]  Contact [phone] Monday through Friday 8am- 5pm  After hours call [phone]

## 2019-03-05 ENCOUNTER — Non-Acute Institutional Stay (SKILLED_NURSING_FACILITY): Payer: Medicare Other | Admitting: Adult Health

## 2019-03-05 ENCOUNTER — Encounter: Payer: Self-pay | Admitting: Adult Health

## 2019-03-05 DIAGNOSIS — I5022 Chronic systolic (congestive) heart failure: Secondary | ICD-10-CM

## 2019-03-05 DIAGNOSIS — I110 Hypertensive heart disease with heart failure: Secondary | ICD-10-CM | POA: Diagnosis not present

## 2019-03-05 DIAGNOSIS — E782 Mixed hyperlipidemia: Secondary | ICD-10-CM

## 2019-03-05 DIAGNOSIS — F0281 Dementia in other diseases classified elsewhere with behavioral disturbance: Secondary | ICD-10-CM

## 2019-03-05 DIAGNOSIS — F39 Unspecified mood [affective] disorder: Secondary | ICD-10-CM

## 2019-03-05 DIAGNOSIS — M810 Age-related osteoporosis without current pathological fracture: Secondary | ICD-10-CM

## 2019-03-05 DIAGNOSIS — I4891 Unspecified atrial fibrillation: Secondary | ICD-10-CM | POA: Diagnosis not present

## 2019-03-05 DIAGNOSIS — E034 Atrophy of thyroid (acquired): Secondary | ICD-10-CM

## 2019-03-05 DIAGNOSIS — K219 Gastro-esophageal reflux disease without esophagitis: Secondary | ICD-10-CM

## 2019-03-05 DIAGNOSIS — J4540 Moderate persistent asthma, uncomplicated: Secondary | ICD-10-CM

## 2019-03-05 DIAGNOSIS — G301 Alzheimer's disease with late onset: Secondary | ICD-10-CM

## 2019-03-05 DIAGNOSIS — F02818 Dementia in other diseases classified elsewhere, unspecified severity, with other behavioral disturbance: Secondary | ICD-10-CM

## 2019-03-05 LAB — BASIC METABOLIC PANEL
BUN: 22 — AB (ref 4–21)
Creatinine: 1.2 — AB (ref 0.5–1.1)
Glucose: 91
Potassium: 4.2 (ref 3.4–5.3)
Sodium: 144 (ref 137–147)

## 2019-03-05 NOTE — Progress Notes (Signed)
Location:    [facility] Room Number: [phone] Place of Service:  SNF (31)   CODE STATUS: DNR  No Known Allergies  Chief Complaint  Patient presents with  . Medical Management of Chronic Issues    Hypertensive heart disease with chronic systolic congestive heart failure; chronic systolic heart failure; atrial fibrillation with RVR    HPI:  She is a 78 year old long term resident of this facility being seen for the management of her chronic illnesses; hypertensive heart disease; chf; afib. There are no reports of agitation or anxiety; no uncontrolled pain; no changes in appetite. No reports of fevers.   Past Medical History:  Diagnosis Date  . A-fib ([place]) 01/07/2012  . ANEMIA 07/29/2010   Qualifier: Diagnosis of  By: Bobby Rumpf CMA (AAMA), [place]    . Angina pectoris ([place])   . Anxiety   . Asthma   . Asthma 01/07/2012  . Barrett's esophagus   . Bronchitis   . 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 Chest pain   . Coronary artery disease    right carotid occlusion   . Dementia with behavioral disturbance ([place]) 09/02/2016  . Depression   . Dysrhythmia    atrial fibrillation  . GERD (gastroesophageal reflux disease)   . 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 Hyperlipidemia   . Hypertension   . Mood disorder ([place])   . Paranoia ([place]) 10/03/2016  . Recurrent upper respiratory infection (URI)   . Stroke [facility])    2007, after left endarterectomy    Past Surgical History:  Procedure Laterality Date  . ABDOMINAL HYSTERECTOMY    . BALLOON DILATION  10/18/2012   Procedure: BALLOON DILATION;  Surgeon: Lear Ng, MD;  Location: [facility] ENDOSCOPY;  Service: Endoscopy;  Laterality: N/A;  . CARDIAC CATHETERIZATION    . CAROTID ENDARTERECTOMY     left, complicated by stroke  . CHOLECYSTECTOMY     incidental at time of colectomy  . COLONOSCOPY  12/01/2011   Procedure: COLONOSCOPY;  Surgeon: Lear Ng, MD;  Location: [facility] ENDOSCOPY;  Service:  Endoscopy;  Laterality: N/A;  . COLONOSCOPY  10/18/2012   Procedure: COLONOSCOPY;  Surgeon: Lear Ng, MD;  Location: [facility] ENDOSCOPY;  Service: Endoscopy;  Laterality: N/A;  colonic dilation  . ESOPHAGOGASTRODUODENOSCOPY  12/01/2011   Procedure: ESOPHAGOGASTRODUODENOSCOPY (EGD);  Surgeon: Lear Ng, MD;  Location: [facility];  Service: Endoscopy;  Laterality: N/A;  . HOT HEMOSTASIS  12/01/2011   Procedure: HOT HEMOSTASIS (ARGON PLASMA COAGULATION/BICAP);  Surgeon: Lear Ng, MD;  Location: [facility]OS[facility];  Service: Endoscopy;  Laterality: N/A;  . 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 Marital status: Divorced    Spouse name: Not on file  . Number of children: Not on file  . Years of education: Not on file  . Highest education level: Not on file  Occupational History  . Occupation: retired Tour manager  . Financial resource strain: Not hard at all  . Food insecurity:    Worry: Never true    Inability: Never true  . Transportation needs:    Medical: No    Non-medical: No  Tobacco Use  . Smoking status: Former Smoker    Last attempt to quit: 04/09/2003    Years since quitting: 15.9  . Smokeless tobacco: Never Used  Substance and Sexual  Activity  . Alcohol use: No  . Drug use: No  . Sexual activity: Never  Lifestyle  . Physical activity:    Days per week: 0 days    Minutes per session: 0 min  . Stress: Not at all  Relationships  . Social connections:    Talks on phone: Once a week    Gets together: Once a week    Attends religious service: Never    Active member of club or organization: No    Attends meetings of clubs or organizations: Never    Relationship status: Divorced  . Intimate partner violence:    Fear of current or ex partner: No    Emotionally abused: No    Physically abused: No    Forced sexual activity: No  Other Topics Concern  .  Not on file  Social History Narrative   Admitted to [facility] 08/16/16   Divorced   Former smoker-stopped 2004   Alcohol none   DNR   Family History  Problem Relation Age of Onset  . Heart disease Mother   . Cancer Mother   . Heart attack Father   . Cancer Sister        ovarian      VITAL SIGNS BP 115/72   Pulse 84   Temp 98.3 F (36.8 C)   Resp 18   Ht 5' (1.524 m)   Wt 148 lb (67.1 kg)   BMI 28.90 kg/m   Outpatient Encounter Medications as of 03/05/2019  Medication Sig  . alendronate (FOSAMAX) 70 MG tablet Take 70 mg by mouth every Wednesday. Take with a full glass of water on an empty stomach.  . carvedilol (COREG) 3.125 MG tablet Take 1 tablet (3.125 mg total) by mouth 2 (two) times daily with a meal.  . Ensure (ENSURE) Take 237 mLs by mouth 2 (two) times daily between meals. Due to weight loss  . isosorbide mononitrate (IMDUR) 30 MG 24 hr tablet Take 30 mg by mouth daily.  Marland Kitchen levothyroxine (SYNTHROID, LEVOTHROID) 25 MCG tablet Take 25 mcg by mouth daily before breakfast.   . lisinopril (PRINIVIL,ZESTRIL) 2.5 MG tablet Take 2.5 mg by mouth daily.  . mirtazapine (REMERON) 15 MG tablet Take 7.5 mg by mouth at bedtime. 1/2 TAB  . Multiple Vitamins-Minerals (PRESERVISION AREDS 2 PO) Take by mouth. TAKE 1 CAPSULE BY MOUTH TWICE A DAY  . pantoprazole sodium (PROTONIX) 40 mg/20 mL PACK Mix 1 packet on 1 teaspoonful of applesauce or 5 mL and take daily, DO NOT ADMINISTER WITH ANY OTHER LIQUIDS OR FOODS  . QUEtiapine (SEROQUEL) 100 MG tablet Take 100 mg by mouth 2 (two) times daily.   . rivaroxaban (XARELTO) 10 MG TABS tablet Take 15 mg by mouth daily. 1 and 1/2 tablet  . sertraline (ZOLOFT) 25 MG tablet Take 25 mg by mouth daily. Take 1 tablet, along with Sertraline 50 mg to equal 75mg , by mouth daily  . sertraline (ZOLOFT) 50 MG tablet Take 50 mg by mouth. TAKE 1 AND 1/2 TABLETS (75MG ) BY MOUTH ONCE DAILY MOOD/DEPRESSION  . simvastatin (ZOCOR) 10 MG tablet Take 10 mg by mouth  at bedtime.  . traZODone (DESYREL) 100 MG tablet Take 200 mg by mouth at bedtime.   . Vitamin D, Ergocalciferol, (DRISDOL) 1.25 MG (50000 UT) CAPS capsule Take 50,000 Units by mouth. TAKE 1 CAPSULE BY MOUTH ONCE A MONTH FOR VITAMIN D DEF (DO NOT CRUSH)  . [DISCONTINUED] fluticasone (FLONASE) 50 MCG/ACT nasal spray Place 1 spray  into both nostrils daily.   . [DISCONTINUED] tiotropium (SPIRIVA) 18 MCG inhalation capsule Place 18 mcg into inhaler and inhale daily.   No facility-administered encounter medications on file as of 03/05/2019.      SIGNIFICANT DIAGNOSTIC EXAMS  LABS REVIEWED TODAY:   12-11-18: wbc 4.8; hgb 12.2; hct 36; plt 153; glucose 79' bun 31; creat 1.2; k+ 45; na++ 141 liver normal chol 130; ldl 64; trig 84; hdl 40 hgb a1c 5.7 tsh 2.50  12-21-18: wbc 5.0; hgb 11.7; hct 37; plt 162; glucose 88 bun 19; creat 1.0; k+ 4.2' na++ 142    Review of Systems  Unable to perform ROS: Dementia (unable to participate )    Physical Exam Constitutional:      General: She is not in acute distress.    Appearance: She is well-developed. She is not diaphoretic.  Neck:     Musculoskeletal: Neck supple.     Thyroid: No thyromegaly.  Cardiovascular:     Rate and Rhythm: Normal rate and regular rhythm.     Pulses: Normal pulses.     Heart sounds: Normal heart sounds.  Pulmonary:     Effort: Pulmonary effort is normal. No respiratory distress.     Breath sounds: Normal breath sounds.  Abdominal:     General: Bowel sounds are normal. There is no distension.     Palpations: Abdomen is soft.     Tenderness: There is no abdominal tenderness.  Musculoskeletal:     Right lower leg: No edema.     Left lower leg: No edema.     Comments: Is able to move all extremities   Lymphadenopathy:     Cervical: No cervical adenopathy.  Skin:    General: Skin is warm and dry.  Neurological:     Mental Status: She is alert. Mental status is at baseline.  Psychiatric:        Mood and Affect: Mood  normal.       ASSESSMENT/ PLAN:  TODAY:   1. Hypertensive heart disease with chronic systolic heart failure: is stable: b/p 115/72: will continue lisinopril 2.5 mg daily imdur 30 mg daily coreg 3.125 mg twice daily   2. Chronic systolic heart failure: is stable will conitnue imdur 30 mg daily coreg 3.125 mg twice daily  3. Atrial fibrillation with RVR: heart rate stable: will continue coreg 3.125 mg twice daily for rate control and xarelto 15 mg daily   4. Moderate persistent asthma: is stable is off her medications; she has been declining them'  5. GERD without esophagitis: is stable will continue protonix 40 mg daily   6. Hypothyroidism due to acquired atrophy of thyroid: is stable tsh 2.560 will continue synthroid 25 mcg daily   7. 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 Mixed hyperlipidemia: is stable LDL 64 will continue zocor 10 mg daily   9. Post menopausal osteoporosis: is stable will continue fosamax 70 mg weekly   10. Mood disorder: is without change: will continue seroquel 100 mg twice daily zoloft 75 mg daily remeron 7.5 mg nightly and trazodone 200 mg nightly   11. Vit D deficiency: is stable will continue vitamin D 50,000 units monthly         MD is aware of resident's narcotic use and is in agreement with current plan of care. We will attempt to wean resident as [place] NP [facility]  Contact [phone]  Monday through Friday 8am- 5pm  After hours call [phone]

## 2019-03-07 DIAGNOSIS — M810 Age-related osteoporosis without current pathological fracture: Secondary | ICD-10-CM | POA: Insufficient documentation

## 2019-03-19 ENCOUNTER — Encounter: Payer: Self-pay | Admitting: Internal Medicine

## 2019-03-19 ENCOUNTER — Non-Acute Institutional Stay (SKILLED_NURSING_FACILITY): Payer: Medicare Other | Admitting: Internal Medicine

## 2019-03-19 DIAGNOSIS — N390 Urinary tract infection, site not specified: Secondary | ICD-10-CM | POA: Diagnosis not present

## 2019-03-19 DIAGNOSIS — B9620 Unspecified Escherichia coli [E. coli] as the cause of diseases classified elsewhere: Secondary | ICD-10-CM | POA: Diagnosis not present

## 2019-03-19 NOTE — Progress Notes (Signed)
:  Location:  [facility] Room Number: [phone] Place of Service:  SNF (31)  Cindy [name]. Cindy Coil, MD  Patient Care Team: [name], MD as PCP - General (Internal Medicine)  Extended Emergency Contact Information Primary Emergency Contact: Tyner,[name] Address: [address] [place], [place] [address] [name] Work Phone: [phone] Mobile Phone: [phone] Relation: Daughter Secondary Emergency Contact: Macall, Mccroskey Mobile Phone: [phone] Relation: Son     Allergies: Patient has no known allergies.  Chief Complaint  Patient presents with   Acute Visit    UTI    HPI: Patient is 78 y.o. female who is being seen for a UTI.  Patient has been complaining of dysuria and frequency, no fever or flank pain.  Patient's urine returned with greater than 100,000 E. coli.  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)    Paranoia ([place]) 10/03/2016   Recurrent upper respiratory infection (URI)    Stroke ([place])    2007, after left endarterectomy    Past Surgical History:  Procedure Laterality Date   ABDOMINAL HYSTERECTOMY     BALLOON DILATION  10/18/2012   Procedure: BALLOON DILATION;  Surgeon: Lear Ng, MD;  Location: [facility];  Service: Endoscopy;  Laterality: N/A;   CARDIAC CATHETERIZATION     CAROTID ENDARTERECTOMY     left,  complicated by stroke   CHOLECYSTECTOMY     incidental at time of colectomy   COLONOSCOPY  12/01/2011   Procedure: COLONOSCOPY;  Surgeon: Lear Ng, MD;  Location: [facility];  Service: Endoscopy;  Laterality: N/A;   COLONOSCOPY  10/18/2012   Procedure: COLONOSCOPY;  Surgeon: Lear Ng, MD;  Location: [facility];  Service: Endoscopy;  Laterality: N/A;  colonic dilation   ESOPHAGOGASTRODUODENOSCOPY  12/01/2011   Procedure: ESOPHAGOGASTRODUODENOSCOPY (EGD);  Surgeon: Lear Ng, MD;  Location: [facility];  Service: Endoscopy;  Laterality: N/A;   HOT HEMOSTASIS  12/01/2011   Procedure: HOT HEMOSTASIS (ARGON PLASMA COAGULATION/BICAP);  Surgeon: Lear Ng, MD;  Location: [facility];  Service: Endoscopy;  Laterality: N/A;   PARTIAL COLECTOMY     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 Always use your most recent med list.        alendronate 70 MG tablet Commonly known as:  FOSAMAX Take 70 mg by mouth every Wednesday. Take with a full glass of water on an empty stomach.   carvedilol 3.125 MG tablet Commonly known as:  COREG Take 1 tablet (3.125 mg total) by mouth  2 (two) times daily with a meal.   ciprofloxacin 500 MG tablet Commonly known as:  CIPRO Take 500 mg by mouth 2 (two) times daily.   Ensure Take 237 mLs by mouth 2 (two) times daily between meals. Due to weight loss   isosorbide mononitrate 30 MG 24 hr tablet Commonly known as:  IMDUR Take 30 mg by mouth daily.   levothyroxine 25 MCG tablet Commonly known as:  SYNTHROID Take 25 mcg by mouth daily before breakfast.   lisinopril 2.5 MG tablet Commonly known as:  ZESTRIL Take 2.5 mg by mouth daily.   mirtazapine 15 MG tablet Commonly known as:  REMERON Take 7.5 mg by mouth at bedtime. 1/2 TAB   PRESERVISION AREDS 2 PO Take by  mouth. TAKE 1 CAPSULE BY MOUTH TWICE A DAY   Protonix 40 mg/20 mL Pack Generic drug:  pantoprazole sodium Mix 1 packet on 1 teaspoonful of applesauce or 5 mL and take daily, DO NOT ADMINISTER WITH ANY OTHER LIQUIDS OR FOODS   QUEtiapine 100 MG tablet Commonly known as:  SEROQUEL Take 100 mg by mouth 2 (two) times daily.   rivaroxaban 10 MG Tabs tablet Commonly known as:  XARELTO Take 15 mg by mouth daily. 1 and 1/2 tablet   sertraline 50 MG tablet Commonly known as:  ZOLOFT Take 50 mg by mouth. TAKE 1 AND 1/2 TABLETS (75MG ) BY MOUTH ONCE DAILY MOOD/DEPRESSION   sertraline 25 MG tablet Commonly known as:  ZOLOFT Take 25 mg by mouth daily. Take 1 tablet, along with Sertraline 50 mg to equal 75mg , by mouth daily   simvastatin 10 MG tablet Commonly known as:  ZOCOR Take 10 mg by mouth at bedtime.   traZODone 100 MG tablet Commonly known as:  DESYREL Take 200 mg by mouth at bedtime.   Vitamin D (Ergocalciferol) 1.25 MG (50000 UT) Caps capsule Commonly known as:  DRISDOL Take 50,000 Units by mouth. TAKE 1 CAPSULE BY MOUTH ONCE A MONTH FOR VITAMIN D DEF (DO NOT CRUSH)       No orders of the defined types were placed in this encounter.   Immunization History  Administered Date(s) Administered   Influenza-Unspecified 08/26/2016, 10/21/2017   PPD Test 08/16/2016   Pneumococcal Polysaccharide-23 01/08/2012    Social History   Tobacco Use   Smoking status: Former Smoker    Last attempt to quit: 04/09/2003    Years since quitting: 15.9   Smokeless tobacco: Never Used  Substance Use Topics   Alcohol use: No    Family history is   Family History  Problem Relation Age of Onset   Heart disease Mother    Cancer Mother    Heart attack Father    Cancer Sister        ovarian      Review of Systems  DATA OBTAINED: from patient GENERAL:  no fevers, fatigue, appetite changes SKIN: No itching, or rash EYES: No eye pain, redness, discharge EARS: No earache,  tinnitus, change in hearing NOSE: No congestion, drainage or bleeding  MOUTH/THROAT: No mouth or tooth pain, No sore throat RESPIRATORY: No cough, wheezing, SOB CARDIAC: No chest pain, palpitations, lower extremity edema  GI: No abdominal pain, No N/V/D or constipation, No heartburn or reflux  GU: ++ dysuria,+ frequency + urgency, no incontinence  MUSCULOSKELETAL: No unrelieved bone/joint pain NEUROLOGIC: No headache, dizziness or focal weakness PSYCHIATRIC: No c/o anxiety or sadness   Vitals:   03/19/19 1551  BP: (!) 148/76  Pulse: 68  Resp: 16  Temp: 98.2 F (36.8 C)    SpO2 Readings from Last 1 Encounters:  06/08/18 96%   Body mass index is 27.5 kg/m.     Physical Exam  GENERAL APPEARANCE: Alert, conversant,  No acute distress.  SKIN: No diaphoresis rash HEAD: Normocephalic, atraumatic  EYES: Conjunctiva/lids clear. Pupils round, reactive. EOMs intact.  EARS: External exam WNL, canals clear. Hearing grossly normal.  NOSE: No deformity or discharge.  MOUTH/THROAT: Lips w/o lesions  RESPIRATORY: Breathing is even, unlabored. Lung sounds are clear   CARDIOVASCULAR: Heart RRR no murmurs, rubs or gallops. No peripheral edema.   GASTROINTESTINAL: Abdomen is soft, non-tender, not distended w/ normal bowel sounds. GENITOURINARY: Bladder non tender, not distended  MUSCULOSKELETAL: No abnormal joints or musculature NEUROLOGIC:  Cranial nerves 2-12 grossly intact. 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/02/2016   Dementia with behavioral disturbance ([place]) 09/02/2016   Asthma with acute exacerbation 08/21/2016   Atrial fibrillation with RVR ([place]) 08/21/2016   Elevated INR 27/04/2375   Chronic systolic heart failure ([place]) 08/21/2016   Hyperlipidemia 08/21/2016   Electrolyte abnormality 08/21/2016   HCAP (healthcare-associated pneumonia) 08/10/2016   Sepsis ([place]) 08/09/2016   Coronary atherosclerosis of native coronary artery 01/21/2014   Obesity 01/17/2014   Abdominal pain, generalized 10/18/2012   GERD (gastroesophageal reflux disease)    Mood disorder ([facility])    Anxiety    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[place])    Stroke ([place])    Chest pain 01/07/2012   Asthma 01/07/2012   A-fib ([place]) 01/07/2012   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   Anemia 01/07/2012   ANEMIA 07/29/2010   BARRETTS ESOPHAGUS 06/04/2010      Labs reviewed: Basic Metabolic Panel:    Component Value Date/Time   NA 142 12/21/2018   K 4.2 12/21/2018   CL 103 04/29/2018 0938   CO2 29 04/29/2018 0938   GLUCOSE 102 (H) 04/29/2018 0938   BUN 19 12/21/2018   CREATININE 1.0 12/21/2018   CREATININE 1.00 04/29/2018 0938   CALCIUM 9.6 04/29/2018 0938   PROT 6.9 11/08/2017 1512   ALBUMIN 3.8 11/08/2017 1512   AST 11 (A) 12/11/2018   ALT 6 (A) 12/11/2018   ALKPHOS 61 12/11/2018   BILITOT 0.9 11/08/2017 1512   GFRNONAA 53 (L) 04/29/2018 0938   GFRAA >60 04/29/2018 0938    Recent Labs    04/29/18 0938 09/06/18 12/11/18 12/21/18  NA 140 140 141 142  K 4.7 4.0 4.5  4.2  CL 103  --   --   --   CO2 29  --   --   --   GLUCOSE 102*  --   --   --   BUN  9 22* 31* 19  CREATININE 1.00 1.0 1.2* 1.0  CALCIUM 9.6  --   --   --    Liver Function Tests: Recent Labs    09/06/18 12/11/18  AST 15 11*  ALT 11 6*  ALKPHOS 65 61   No results for input(s): LIPASE, AMYLASE in the last 8760 hours. No results for input(s): AMMONIA in the last 8760 hours. CBC: Recent Labs    04/29/18 0938 09/06/18 12/11/18 12/21/18  WBC 6.6 6.8 4.8 5.0  NEUTROABS 5.0  --   --   --   HGB 13.4 13.7 12.2 11.7*  HCT 42.8 40 36 37  MCV 84.9  --   --   --   PLT 150 208 153 162   Lipid Recent Labs    12/11/18  CHOL 130  HDL 49  LDLCALC 64  TRIG 84    Cardiac Enzymes: No results for input(s): CKTOTAL, CKMB, CKMBINDEX, TROPONINI in the last 8760 hours. BNP: No results for input(s): BNP in the last 8760 hours. No results found for: [facility] Lab Results  Component Value Date   HGBA1C 5.7 12/11/2018   Lab Results  Component Value Date   TSH 2.50 12/11/2018   Lab Results  Component Value Date   VITAMINB12 1,820 09/29/2016   No results found for: FOLATE No results found for: IRON, TIBC, FERRITIN  Imaging and Procedures obtained prior to SNF admission: Ct Head Wo Contrast  Result Date: 04/29/2018 CLINICAL DATA:  S/p Fall, hit the left sided of her forehead. Denies LOC. Pt has hematoma to the left side of her forehead. Hx: Stroke, HTN, Dementia EXAM: CT HEAD WITHOUT CONTRAST CT CERVICAL SPINE WITHOUT CONTRAST TECHNIQUE: Multidetector CT imaging of the head and cervical spine was performed following the standard protocol without intravenous contrast. Multiplanar CT image reconstructions of the cervical spine were also generated. COMPARISON:  11/08/2017. FINDINGS: CT HEAD FINDINGS Brain: No evidence of acute infarction, hemorrhage, hydrocephalus, extra-axial collection or mass lesion/mass effect. Well-defined hypoattenuation with encephalomalacia in the right  parietooccipital lobes, and hypoattenuation extending from the posterior, medial deep left frontal lobe white matter to the left posterosuperior frontal lobe and left parietal lobe, are stable consistent with old infarcts. There is ventricular and sulcal enlargement reflecting mild to moderate diffuse atrophy. Patchy white matter hypoattenuation is also noted consistent with chronic microvascular ischemic change. Vascular: No hyperdense vessel or unexpected calcification. Skull: Normal. Negative for fracture or focal lesion. Sinuses/Orbits: Globes and orbits are unremarkable. Sinuses are clear. Many of the right mastoid air cells show fluid attenuation, stable from the prior exam consistent with chronic effusion. Other: None. CT CERVICAL SPINE FINDINGS Alignment: Normal. Skull base and vertebrae: No acute fracture. No primary bone lesion or focal pathologic process. Soft tissues and spinal canal: No prevertebral fluid or swelling. No visible canal hematoma. Disc levels: Mild to moderate loss of disc height at C5-C6 and C6-C7 with mild diffuse spondylotic disc bulging. There are facet degenerative changes bilaterally, greatest on the right at C3-C4. No convincing disc herniation. Upper chest: No acute findings. 1.7 cm right thyroid nodule. Carotid artery vascular calcifications, greater on the right. Peripheral interstitial scarring at the apices. Other: None. IMPRESSION: HEAD CT 1. No acute intracranial abnormalities. 2. Old infarcts, atrophy and chronic microvascular ischemic changes similar to the prior exam. CERVICAL CT 1. No fracture or acute finding. 2. Right thyroid nodule stable from a cervical CT dated 04/26/2016. Electronically Signed   By: Lajean Manes M.D.   On: 04/29/2018 10:49  Ct Cervical Spine Wo Contrast  Result Date: 04/29/2018 CLINICAL DATA:  S/p Fall, hit the left sided of her forehead. Denies LOC. Pt has hematoma to the left side of her forehead. Hx: Stroke, HTN, Dementia EXAM: CT HEAD  WITHOUT CONTRAST CT CERVICAL SPINE WITHOUT CONTRAST TECHNIQUE: Multidetector CT imaging of the head and cervical spine was performed following the standard protocol without intravenous contrast. Multiplanar CT image reconstructions of the cervical spine were also generated. COMPARISON:  11/08/2017. FINDINGS: CT HEAD FINDINGS Brain: No evidence of acute infarction, hemorrhage, hydrocephalus, extra-axial collection or mass lesion/mass effect. Well-defined hypoattenuation with encephalomalacia in the right parietooccipital lobes, and hypoattenuation extending from the posterior, medial deep left frontal lobe white matter to the left posterosuperior frontal lobe and left parietal lobe, are stable consistent with old infarcts. There is ventricular and sulcal enlargement reflecting mild to moderate diffuse atrophy. Patchy white matter hypoattenuation is also noted consistent with chronic microvascular ischemic change. Vascular: No hyperdense vessel or unexpected calcification. Skull: Normal. Negative for fracture or focal lesion. Sinuses/Orbits: Globes and orbits are unremarkable. Sinuses are clear. Many of the right mastoid air cells show fluid attenuation, stable from the prior exam consistent with chronic effusion. Other: None. CT CERVICAL SPINE FINDINGS Alignment: Normal. Skull base and vertebrae: No acute fracture. No primary bone lesion or focal pathologic process. Soft tissues and spinal canal: No prevertebral fluid or swelling. No visible canal hematoma. Disc levels: Mild to moderate loss of disc height at C5-C6 and C6-C7 with mild diffuse spondylotic disc bulging. There are facet degenerative changes bilaterally, greatest on the right at C3-C4. No convincing disc herniation. Upper chest: No acute findings. 1.7 cm right thyroid nodule. Carotid artery vascular calcifications, greater on the right. Peripheral interstitial scarring at the apices. Other: None. IMPRESSION: HEAD CT 1. No acute intracranial  abnormalities. 2. Old infarcts, atrophy and chronic microvascular ischemic changes similar to the prior exam. CERVICAL CT 1. No fracture or acute finding. 2. Right thyroid nodule stable from a cervical CT dated 04/26/2016. Electronically Signed   By: Lajean Manes M.D.   On: 04/29/2018 10:49   Ct Pelvis Wo Contrast  Result Date: 04/29/2018 CLINICAL DATA:  Bilateral hip pain after a fall today. Initial encounter. EXAM: CT PELVIS WITHOUT CONTRAST TECHNIQUE: Multidetector CT imaging of the pelvis was performed following the standard protocol without intravenous contrast. COMPARISON:  Pelvic radiograph 04/26/2016. CT abdomen and pelvis 05/29/2012. FINDINGS: Urinary Tract: No distal ureteral dilatation. Under distended bladder though with apparent moderate diffuse bladder wall thickening. Bowel: Mild sigmoid colon diverticulosis without evidence of diverticulitis. Partially visualized colonic dilatation near the splenic flexure at an anastomotic staple line, chronic based on the 2013 CT. Partially visualized supraumbilical hernia now containing a portion of transverse colon without evidence of proximal bowel dilatation to indicate obstruction. Vascular/Lymphatic: Extensive aortoiliac atherosclerosis. No enlarged lymph nodes. Reproductive:  Status post hysterectomy.  No adnexal mass. Other:  No intraperitoneal free fluid. Musculoskeletal: No acute fracture identified. No hip dislocation. Moderate left greater than right hip osteoarthrosis with joint space narrowing, marginal osteophyte formation, and subchondral cyst formation. Degenerative changes of the pubic symphysis and SI joints. Focally advanced disc degeneration at L4-5. Advanced L5-S1 facet arthrosis with grade 1 anterolisthesis at this level as well as at L3-4. IMPRESSION: 1. Bilateral hip osteoarthrosis without evidence of acute osseous abnormality. 2. Supraumbilical hernia now containing a portion of transverse colon, incompletely imaged. 3. Diffuse bladder  wall thickening, correlate for cystitis. 4.  Aortic Atherosclerosis (ICD10-I70.0). Electronically Signed   By:  Logan Bores M.D.   On: 04/29/2018 13:51   Dg Knee Complete 4 Views Left  Result Date: 04/29/2018 CLINICAL DATA:  Pt fell this am trying to go to the bathroom. Pt is having left knee and hand pain and is also having pain in her forehead where she hit her head on the floor. EXAM: LEFT KNEE - COMPLETE 4+ VIEW COMPARISON:  None. FINDINGS: No fracture.  No bone lesion. There is lateral patellofemoral joint space compartment narrowing. Marginal osteophytes project from all 3 compartments. Small joint effusion. Posterior vascular calcifications. IMPRESSION: 1. No fracture, dislocation or acute finding. 2. Moderate arthropathic changes. 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 3. Small joint effusion. Electronically Signed   By: Lajean Manes M.D.   On: 04/29/2018 10:06   Dg Knee Complete 4 Views Right  Result Date: 04/29/2018 CLINICAL DATA:  77 year old female with right knee pain and swelling after falling earlier today EXAM: RIGHT KNEE - COMPLETE 4+ VIEW COMPARISON:  None. FINDINGS: Advanced tricompartmental degenerative osteoarthritis most severe in the patellofemoral and lateral compartments were there is significant joint space narrowing and osteophyte production. No definite acute fracture or malalignment. Small suprapatellar knee joint effusion may be degenerative. Extensive atherosclerotic vascular calcifications are present in the distal superficial femoral artery and throughout the popliteal artery. No focal soft tissue abnormality. IMPRESSION: 1. No acute fracture or malalignment. 2. 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 3. Small suprapatellar knee joint effusion. 4. Femoropopliteal atherosclerotic vascular calcifications. Electronically  Signed   By: Jacqulynn Cadet M.D.   On: 04/29/2018 10:55   Dg Hand Complete Left  Result Date: 04/29/2018 CLINICAL DATA:  Left hand pain due to an injury sustained in a fall in the bathroom this morning. Initial encounter. EXAM: LEFT HAND - COMPLETE 3+ VIEW COMPARISON:  None. FINDINGS: No acute bony or joint abnormality is identified. The patient has moderately severe first CMC osteoarthritis. Soft tissues are unremarkable. IMPRESSION: No acute abnormality. 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 Electronically Signed   By: Inge Rise M.D.   On: 04/29/2018 10:04     Not all labs, radiology exams or other studies done during hospitalization come through on my EPIC note; however they are reviewed by me.    Assessment and Plan  E. coli UTI-greater than 100,000, sensitive; will treat with Cipro 500 mg twice daily for 7 days    Cindy [name]. Cindy Coil, MD

## 2019-03-20 ENCOUNTER — Encounter: Payer: Self-pay | Admitting: Internal Medicine

## 2019-04-03 ENCOUNTER — Encounter: Payer: Self-pay | Admitting: Internal Medicine

## 2019-04-03 ENCOUNTER — Non-Acute Institutional Stay (SKILLED_NURSING_FACILITY): Payer: Medicare Other | Admitting: Internal Medicine

## 2019-04-03 DIAGNOSIS — K2270 Barrett's esophagus without dysplasia: Secondary | ICD-10-CM | POA: Diagnosis not present

## 2019-04-03 DIAGNOSIS — E034 Atrophy of thyroid (acquired): Secondary | ICD-10-CM | POA: Diagnosis not present

## 2019-04-03 DIAGNOSIS — I639 Cerebral infarction, unspecified: Secondary | ICD-10-CM | POA: Diagnosis not present

## 2019-04-03 NOTE — Progress Notes (Signed)
Location:  [facility] Room Number: 305-W Place of Service:  SNF (31)  [name], MD  Patient Care Team: [name], MD as PCP - General (Internal Medicine)  Extended Emergency Contact Information Primary Emergency Contact: Tyner,[name] Address: [address] [place], [place] [address] [name] Work Phone: [phone] Mobile Phone: [phone] Relation: Daughter Secondary Emergency Contact: Maddalynn, Barnard Mobile Phone: [phone] Relation: Son    Allergies: Patient has no known allergies.  Chief Complaint  Patient presents with  . Medical Management of Chronic Issues    Routine [facility] visit    HPI: Patient is 78 y.o. female who is being seen for routine issues of history of stroke, Barrett's esophagus, and hypothyroidism.  Past Medical History:  Diagnosis Date  . A-fib ([place]) 01/07/2012  . ANEMIA 07/29/2010   Qualifier: Diagnosis of  By: Bobby Rumpf CMA (AAMA), Patty    . Angina pectoris ([place])   . Anxiety   . Asthma 01/07/2012  . Barrett's esophagus   . Bronchitis   . 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 Chest pain   . Coronary artery disease    right carotid occlusion   . Dementia with behavioral disturbance ([place]) 09/02/2016  . Depression   . Dysrhythmia    atrial fibrillation  . GERD (gastroesophageal reflux disease)   . 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 Hyperlipidemia   . Hypertension   . Mood disorder ([place])   . Paranoia ([place]) 10/03/2016  . Recurrent upper respiratory infection (URI)   . Stroke [facility])    2007, after left endarterectomy    Past Surgical History:  Procedure Laterality Date  . ABDOMINAL HYSTERECTOMY    . BALLOON DILATION  10/18/2012   Procedure: BALLOON DILATION;  Surgeon: Lear Ng, MD;  Location: [facility]OSCOPY;  Service: Endoscopy;  Laterality: N/A;  . CARDIAC CATHETERIZATION    . CAROTID ENDARTERECTOMY     left, complicated by stroke  . CHOLECYSTECTOMY      incidental at time of colectomy  . COLONOSCOPY  12/01/2011   Procedure: COLONOSCOPY;  Surgeon: Lear Ng, MD;  [facility];  Service: Endoscopy;  Laterality: N/A;  . COLONOSCOPY  10/18/2012   Procedure: COLONOSCOPY;  Surgeon: Lear Ng, MD;  [facility];  Service: Endoscopy;  Laterality: N/A;  colonic dilation  . ESOPHAGOGASTRODUODENOSCOPY  12/01/2011   Procedure: ESOPHAGOGASTRODUODENOSCOPY (EGD);  Surgeon: Lear Ng, MD;  Location: [facility];  Service: Endoscopy;  Laterality: N/A;  . HOT HEMOSTASIS  12/01/2011   Procedure: HOT HEMOSTASIS (ARGON PLASMA COAGULATION/BICAP);  Surgeon: Lear Ng, MD;  Location: [facility];  Service: Endoscopy;  Laterality: N/A;  . 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 If you have any questions, ask your nurse or doctor.        STOP taking these medications   Vitamin D (Ergocalciferol) 1.25 MG (50000 UT) Caps capsule Commonly known as:  DRISDOL Stopped by:  Inocencio Homes, MD     TAKE these medications   alendronate 70 MG tablet Commonly known as:  FOSAMAX Take 70 mg by mouth every Wednesday. Take with a full glass of water on an  empty stomach.   carvedilol 3.125 MG tablet Commonly known as:  COREG Take 1 tablet (3.125 mg total) by mouth 2 (two) times daily with a meal.   Ensure Take 237 mLs by mouth 2 (two) times daily between meals. Due to weight loss   isosorbide mononitrate 30 MG 24 hr tablet Commonly known as:  IMDUR Take 30 mg by mouth daily.   levothyroxine 25 MCG tablet Commonly known as:  SYNTHROID Take 25 mcg by mouth daily before breakfast.   lisinopril 2.5 MG tablet Commonly known as:  ZESTRIL Take 2.5 mg by mouth daily.   mirtazapine 15 MG tablet Commonly known as:  REMERON Take 7.5 mg by mouth at  bedtime. 1/2 TAB   PRESERVISION AREDS 2 PO Take 1 capsule by mouth 2 (two) times daily.   Protonix 40 mg/20 mL Pack Generic drug:  pantoprazole sodium Mix 1 packet on 1 teaspoonful of applesauce or 5 mL apple juice and take daily, DO NOT ADMINISTER WITH ANY OTHER LIQUIDS OR FOODS   QUEtiapine 100 MG tablet Commonly known as:  SEROQUEL Take 150 mg by mouth 2 (two) times daily. Take 1-1/2 tablets to = 150 mg   rivaroxaban 10 MG Tabs tablet Commonly known as:  XARELTO Take 15 mg by mouth daily. 1 and 1/2 tablets to = 15 mg   sertraline 50 MG tablet Commonly known as:  ZOLOFT Take 50 mg by mouth. TAKE 1 AND 1/2 TABLETS (75MG ) BY MOUTH ONCE DAILY MOOD/DEPRESSION   sertraline 25 MG tablet Commonly known as:  ZOLOFT Take 25 mg by mouth daily. Take 1 tablet, along with Sertraline 50 mg to equal 75mg , by mouth daily   simvastatin 10 MG tablet Commonly known as:  ZOCOR Take 10 mg by mouth at bedtime.   traZODone 100 MG tablet Commonly known as:  DESYREL Take 200 mg by mouth at bedtime. Take 2 tablets to = 200 mg   Vitamin D3 1.25 MG (50000 UT) Caps Take 1 capsule by mouth every 30 (thirty) days.       No orders of the defined types were placed in this encounter.   Immunization History  Administered Date(s) Administered  . Influenza-Unspecified 08/26/2016, 10/21/2017  . PPD Test 08/16/2016  . Pneumococcal Polysaccharide-23 01/08/2012    Social History   Tobacco Use  . Smoking status: Former Smoker    Last attempt to quit: 04/09/2003    Years since quitting: 15.9  . Smokeless tobacco: Never Used  Substance Use Topics  . Alcohol use: No    Review of Systems  DATA OBTAINED: from patient, nurse GENERAL:  no fevers, fatigue, appetite changes SKIN: No itching, rash HEENT: No complaint RESPIRATORY: No cough, wheezing, SOB CARDIAC: No chest pain, palpitations, lower extremity edema  GI: No abdominal pain, No N/V/D or constipation, No heartburn or reflux  GU: No  dysuria, frequency or urgency, or incontinence  MUSCULOSKELETAL: No unrelieved bone/joint pain NEUROLOGIC: No headache, dizziness  PSYCHIATRIC: No overt anxiety or sadness  Vitals:   04/03/19 0909  BP: (!) 135/91  Pulse: (!) 55  Resp: 18  Temp: (!) 97.1 F (36.2 C)  SpO2: 98%   Body mass index is 27.5 kg/m. Physical Exam  GENERAL APPEARANCE: Alert, conversant, No acute distress  SKIN: No diaphoresis rash HEENT: Unremarkable RESPIRATORY: Breathing is even, unlabored. Lung sounds are clear   CARDIOVASCULAR: Heart RRR no murmurs, rubs or gallops. No peripheral edema  GASTROINTESTINAL: Abdomen is soft, non-tender, not distended w/ normal bowel sounds.  GENITOURINARY: Bladder  non tender, not distended  MUSCULOSKELETAL: No abnormal joints or musculature NEUROLOGIC: Cranial nerves 2-12 grossly intact. Moves all extremities PSYCHIATRIC: Mood and affect appropriate to situation, occasional behavioral issues, sometimes requiring psychiatric hospitalization but none recently  Patient Active Problem List   Diagnosis Date Noted  . 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 Macular degeneration 12/09/2018  . Psychoses ([place]) 12/19/2017  . Insomnia 12/19/2017  . Urinary tract infection due to extended-spectrum beta lactamase (ESBL) producing Escherichia coli 11/17/2017  . Dysphagia, oropharyngeal 11/15/2017  . Altered mental status 11/10/2017  . Acute metabolic encephalopathy 25/95/6387  . Mania ([place]) 06/21/2017  . Hypomagnesemia 06/19/2017  . Essential hypertension 06/18/2017  . Acute encephalopathy 06/17/2017  . Acute lower UTI 06/17/2017  . E. coli UTI 06/17/2017  . Depression 04/24/2017  . Hypothyroidism 10/03/2016  . Paranoia ([place]) 10/03/2016  . LOC (loss of consciousness) ([place]) 09/02/2016  . 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 Dementia with behavioral disturbance ([place]) 09/02/2016  . Asthma with acute exacerbation 08/21/2016  . Atrial fibrillation with RVR ([place])  08/21/2016  . Elevated INR 08/21/2016  . Chronic systolic heart failure ([place]) 08/21/2016  . Hyperlipidemia 08/21/2016  . Electrolyte abnormality 08/21/2016  . HCAP (healthcare-associated pneumonia) 08/10/2016  . Sepsis ([place]) 08/09/2016  . Coronary atherosclerosis of native coronary artery 01/21/2014  . Obesity 01/17/2014  . Abdominal pain, generalized 10/18/2012  . GERD (gastroesophageal reflux disease)   . Mood disorder ([place])   . Anxiety   . 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[place])   . Stroke ([place])   . Chest pain 01/07/2012  . Asthma 01/07/2012  . A-fib ([place]) 01/07/2012  . 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 Anemia 01/07/2012  . ANEMIA 07/29/2010  . BARRETTS ESOPHAGUS 06/04/2010    CMP     Component Value Date/Time   NA 142 12/21/2018   K 4.2 12/21/2018   CL 103 04/29/2018 0938   CO2 29 04/29/2018 0938   GLUCOSE 102 (H) 04/29/2018 0938   BUN 19 12/21/2018   CREATININE 1.0 12/21/2018   CREATININE 1.00 04/29/2018 0938   CALCIUM 9.6 04/29/2018 0938   PROT 6.9 11/08/2017 1512   ALBUMIN 3.8 11/08/2017 1512   AST 11 (A) 12/11/2018   ALT 6 (A) 12/11/2018   ALKPHOS 61 12/11/2018   BILITOT 0.9 11/08/2017 1512   GFRNONAA 53 (L) 04/29/2018 0938   GFRAA >60 04/29/2018 0938   Recent Labs    04/29/18 0938 09/06/18 12/11/18 12/21/18  NA 140 140 141 142  K 4.7 4.0 4.5 4.2  CL 103  --   --   --   CO2 29  --   --   --   GLUCOSE 102*  --   --   --   BUN 9 22* 31* 19  CREATININE 1.00 1.0 1.2* 1.0  CALCIUM 9.6  --   --   --    Recent Labs    09/06/18 12/11/18  AST 15 11*  ALT 11 6*  ALKPHOS 65 61   Recent Labs    04/29/18 0938 09/06/18 12/11/18 12/21/18  WBC 6.6 6.8 4.8 5.0  NEUTROABS 5.0  --   --   --   HGB 13.4 13.7 12.2 11.7*  HCT 42.8 40 36 37  MCV 84.9  --   --   --   PLT 150 208 153 162   Recent Labs    12/11/18  CHOL 130  LDLCALC 64  TRIG 84   No results  found for: [facility] Lab Results  Component Value  Date   TSH 2.50 12/11/2018   Lab Results  Component Value Date   HGBA1C 5.7 12/11/2018   Lab Results  Component Value Date   CHOL 130 12/11/2018   HDL 49 12/11/2018   LDLCALC 64 12/11/2018   TRIG 84 12/11/2018   CHOLHDL 1.9 06/19/2017    Significant Diagnostic Results in last 30 days:  No results found.  Assessment and Plan  Stroke [facility]) No reported problems; continue with prophylaxis with aspirin 80 mg daily; patient is on blood pressure medication, patient is on statin     BARRETTS ESOPHAGUS No problems with reflux; patient on Protonix 40 mg daily and will continue  Hypothyroidism TSH 3.15, good control; continue Synthroid 25 mcg daily     Rosie Torrez D. [name], MD

## 2019-04-04 ENCOUNTER — Encounter: Payer: Self-pay | Admitting: Internal Medicine

## 2019-04-04 NOTE — Assessment & Plan Note (Signed)
No problems with reflux; patient on Protonix 40 mg daily and will continue

## 2019-04-04 NOTE — Assessment & Plan Note (Signed)
TSH 3.15, good control; continue Synthroid 25 mcg daily

## 2019-04-04 NOTE — Assessment & Plan Note (Signed)
No reported problems; continue with prophylaxis with aspirin 80 mg daily; patient is on blood pressure medication, patient is on statin

## 2019-05-01 ENCOUNTER — Non-Acute Institutional Stay (SKILLED_NURSING_FACILITY): Payer: Medicare Other | Admitting: Internal Medicine

## 2019-05-01 DIAGNOSIS — B3731 Acute candidiasis of vulva and vagina: Secondary | ICD-10-CM

## 2019-05-01 DIAGNOSIS — B373 Candidiasis of vulva and vagina: Secondary | ICD-10-CM | POA: Diagnosis not present

## 2019-05-02 ENCOUNTER — Encounter: Payer: Self-pay | Admitting: Internal Medicine

## 2019-05-02 ENCOUNTER — Non-Acute Institutional Stay (SKILLED_NURSING_FACILITY): Payer: Medicare Other | Admitting: Internal Medicine

## 2019-05-02 DIAGNOSIS — R1312 Dysphagia, oropharyngeal phase: Secondary | ICD-10-CM | POA: Diagnosis not present

## 2019-05-02 DIAGNOSIS — J4540 Moderate persistent asthma, uncomplicated: Secondary | ICD-10-CM | POA: Diagnosis not present

## 2019-05-02 DIAGNOSIS — I480 Paroxysmal atrial fibrillation: Secondary | ICD-10-CM

## 2019-05-02 NOTE — Progress Notes (Signed)
Location:  [facility] Room Number: [phone] Place of Service:  SNF (31)  Cindy Duos, MD  Patient Care Team: Cindy Duos, MD as PCP - General (Internal Medicine)  Extended Emergency Contact Information Primary Emergency Contact: Cindy [name] Address: [address] [place], [place] [address] Cindy [name] Work Phone: [phone] Mobile Phone: [phone] Relation: Daughter Secondary Emergency Contact: Cindy [name] Mobile Phone: [phone] Relation: Son   Chief Complaint  Patient presents with  . Acute Visit    Vaginal Itching     HPI:  Pt is a 78 y.o. female seen today for for something going on "down there", for several days.  Patient admits to itching and some discharge as well as some burning with urination especially when the urine hits her outside skin.  No fever nausea vomiting or any other systemic symptom; patient has had this before   Past Medical History:  Diagnosis Date  . A-fib ([place]) 01/07/2012  . ANEMIA 07/29/2010   Qualifier: Diagnosis of  By: Bobby Rumpf CMA (AAMA), Patty    . Angina pectoris ([place])   . Anxiety   . Asthma 01/07/2012  . Barrett's esophagus   . Bronchitis   . 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 Chest pain   . Coronary artery disease    right carotid occlusion   . Dementia with behavioral disturbance ([place]) 09/02/2016  . Depression   . Dysrhythmia    atrial fibrillation  . GERD (gastroesophageal reflux disease)   . 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 Hyperlipidemia   . Hypertension   . Mood disorder ([place])   . Paranoia ([place]) 10/03/2016  . Recurrent upper respiratory infection (URI)   . Stroke [facility])    2007, after left endarterectomy   Past Surgical History:  Procedure Laterality Date  . ABDOMINAL HYSTERECTOMY    . BALLOON DILATION  10/18/2012   Procedure: BALLOON DILATION;  Surgeon: Lear Ng, MD;  [facility];  Service: Endoscopy;  Laterality: N/A;  . CARDIAC  CATHETERIZATION    . CAROTID ENDARTERECTOMY     left, complicated by stroke  . CHOLECYSTECTOMY     incidental at time of colectomy  . COLONOSCOPY  12/01/2011   Procedure: COLONOSCOPY;  Surgeon: Lear Ng, MD;  [facility];  Service: Endoscopy;  Laterality: N/A;  . COLONOSCOPY  10/18/2012   Procedure: COLONOSCOPY;  Surgeon: Lear Ng, MD;  [facility];  Service: Endoscopy;  Laterality: N/A;  colonic dilation  . ESOPHAGOGASTRODUODENOSCOPY  12/01/2011   Procedure: ESOPHAGOGASTRODUODENOSCOPY (EGD);  Surgeon: Lear Ng, MD;  Location: [facility];  Service: Endoscopy;  Laterality: N/A;  . HOT HEMOSTASIS  12/01/2011   Procedure: HOT HEMOSTASIS (ARGON PLASMA COAGULATION/BICAP);  Surgeon: Lear Ng, MD;  Location: [facility];  Service: Endoscopy;  Laterality: N/A;  . 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 If you have any questions, ask your nurse or doctor.        alendronate 70 MG tablet Commonly known as:  FOSAMAX Take 70 mg by mouth every Wednesday. Take with a full glass of water on an empty stomach.   carvedilol 3.125 MG  tablet Commonly known as:  COREG Take 1 tablet (3.125 mg total) by mouth 2 (two) times daily with a meal.   Ensure Take 237 mLs by mouth 2 (two) times daily between meals. Due to weight loss   isosorbide mononitrate 30 MG 24 hr tablet Commonly known as:  IMDUR Take 30 mg by mouth daily.   levothyroxine 25 MCG tablet Commonly known as:  SYNTHROID Take 25 mcg by mouth daily before breakfast.   lisinopril 2.5 MG tablet Commonly known as:  ZESTRIL Take 2.5 mg by mouth daily.   mirtazapine 15 MG tablet Commonly known as:  REMERON Take 7.5 mg by mouth at bedtime. 1/2 TAB   PRESERVISION AREDS 2 PO Take 1 capsule by mouth  2 (two) times daily.   Protonix 40 mg/20 mL Pack Generic drug:  pantoprazole sodium Mix 1 packet on 1 teaspoonful of applesauce or 5 mL apple juice and take daily, DO NOT ADMINISTER WITH ANY OTHER LIQUIDS OR FOODS   QUEtiapine 100 MG tablet Commonly known as:  SEROQUEL Take 150 mg by mouth 2 (two) times daily. Take 1-1/2 tablets to = 150 mg   rivaroxaban 10 MG Tabs tablet Commonly known as:  XARELTO Take 15 mg by mouth daily. 1 and 1/2 tablets to = 15 mg   sertraline 50 MG tablet Commonly known as:  ZOLOFT Take 50 mg by mouth. TAKE 1 AND 1/2 TABLETS (75MG ) BY MOUTH ONCE DAILY MOOD/DEPRESSION   sertraline 25 MG tablet Commonly known as:  ZOLOFT Take 25 mg by mouth daily. Take 1 tablet, along with Sertraline 50 mg to equal 75mg , by mouth daily   simvastatin 10 MG tablet Commonly known as:  ZOCOR Take 10 mg by mouth at bedtime.   traZODone 100 MG tablet Commonly known as:  DESYREL Take 200 mg by mouth at bedtime. Take 2 tablets to = 200 mg   Vitamin D3 1.25 MG (50000 UT) Caps Take 1 capsule by mouth every 30 (thirty) days.       Review of Systems  DATA OBTAINED: from patient, nurse GENERAL:  no fevers, fatigue, appetite changes SKIN: No itching, rash HEENT: No complaint RESPIRATORY: No cough, wheezing, SOB CARDIAC: No chest pain, palpitations, lower extremity edema  GI: No abdominal pain, No N/V/D or constipation, No heartburn or reflux  GU: No dysuria, frequency or urgency, or incontinence; itching and discharge with some pain when urine hits the outside MUSCULOSKELETAL: No unrelieved bone/joint pain NEUROLOGIC: No headache, dizziness  PSYCHIATRIC: No overt anxiety or sadness   Immunization History  Administered Date(s) Administered  . Influenza-Unspecified 08/26/2016, 10/21/2017  . PPD Test 08/16/2016  . Pneumococcal Polysaccharide-23 01/08/2012   Pertinent  Health Maintenance Due  Topic Date Due  . PNA vac Low Risk Adult (2 of 2 - PCV13) 01/07/2013  . DEXA  SCAN  05/12/2019 (Originally 08/15/2004)  . INFLUENZA VACCINE  06/30/2019   Fall Risk  07/13/2018 07/07/2017  Falls in the past year? Yes No  Number falls in past yr: 1 -  Injury with Fall? No -    Vitals:   05/01/19 1115 05/02/19 1105  BP: 136/80 136/80  Pulse: 85 85  Resp: 20 20  Temp: (!) 97.5 F (36.4 C) (!) 97.5 F (36.4 C)  TempSrc: Oral Oral  SpO2: 93% 93%  Weight: 142 lb (64.4 kg) 142 lb 12.8 oz (64.8 kg)  Height: 5' (1.524 m) 5' (1.524 m)   Body mass index is 27.89 kg/m.  Physical Exam  GENERAL APPEARANCE: Alert, conversant,  No acute distress  SKIN: No diaphoresis rash HEENT: Unremarkable RESPIRATORY: Breathing is even, unlabored. Lung sounds are clear   CARDIOVASCULAR: Heart RRR no murmurs, rubs or gallops. No peripheral edema  GASTROINTESTINAL: Abdomen is soft, non-tender, not distended w/ normal bowel sounds.  GENITOURINARY: Bladder non tender, not distended patient declined exam MUSCULOSKELETAL: No abnormal joints or musculature NEUROLOGIC: Cranial nerves 2-12 grossly intact. Moves all extremities PSYCHIATRIC: Mood and affect appropriate to situation with dementia, no behavioral issues  Labs reviewed: Recent Labs    09/06/18 12/11/18 12/21/18  NA 140 141 142  K 4.0 4.5 4.2  BUN 22* 31* 19  CREATININE 1.0 1.2* 1.0   Recent Labs    09/06/18 12/11/18  AST 15 11*  ALT 11 6*  ALKPHOS 65 61   Recent Labs    09/06/18 12/11/18 12/21/18  WBC 6.8 4.8 5.0  HGB 13.7 12.2 11.7*  HCT 40 36 37  PLT 208 153 162   Recent Labs    12/11/18  CHOL 130  LDLCALC 64  TRIG 84   No results found for: [facility] Lab Results  Component Value Date   TSH 2.50 12/11/2018   Lab Results  Component Value Date   HGBA1C 5.7 12/11/2018   Lab Results  Component Value Date   CHOL 130 12/11/2018   HDL 49 12/11/2018   LDLCALC 64 12/11/2018   TRIG 84 12/11/2018   CHOLHDL 1.9 06/19/2017    Significant Diagnostic Results in last 30 days:  No results found.   Assessment/Plan  Vaginal yeast- will treat with Diflucan 150 mg now and 100 mg in 72 hours; will follow response; if any further problems we will at that time to do a GU exam Yeast candidiasis   Cindy [name]. [name], MD

## 2019-05-02 NOTE — Progress Notes (Signed)
Location:  [place] Room Number: [phone] Place of Service:  SNF (31)  [name], MD  Patient Care Team: [name], MD as PCP - General (Internal Medicine)  Extended Emergency Contact Information Primary Emergency Contact: Cindy [name] Address: [address] [place], [place] [address] Cindy [name] Work Phone: [phone] Mobile Phone: [phone] Relation: Daughter Secondary Emergency Contact: Cindy [name] Mobile Phone: [phone] Relation: Son   Chief Complaint  Patient presents with   Medical Management of Chronic Issues    Routine Visit     HPI:  Pt is a 78 y.o. female who is being seen for routine issues of atrial fibrillation, asthma, and dysphasia..     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   Hyperlipidemia    Hypertension    Mood disorder ([place])    Paranoia ([place]) 10/03/2016   Recurrent upper respiratory infection (URI)    Stroke ([place])    2007, after left endarterectomy   Past Surgical History:  Procedure Laterality Date   ABDOMINAL HYSTERECTOMY     BALLOON DILATION  10/18/2012   Procedure: BALLOON DILATION;  Surgeon: Lear Ng, MD;  Location: [facility];  Service: Endoscopy;  Laterality: N/A;   CARDIAC CATHETERIZATION     CAROTID ENDARTERECTOMY     left, complicated by stroke   CHOLECYSTECTOMY     incidental at time of colectomy   COLONOSCOPY  12/01/2011   Procedure:  COLONOSCOPY;  Surgeon: Lear Ng, MD;  [facility];  Service: Endoscopy;  Laterality: N/A;   COLONOSCOPY  10/18/2012   Procedure: COLONOSCOPY;  Surgeon: Lear Ng, MD;  [facility];  Service: Endoscopy;  Laterality: N/A;  colonic dilation   ESOPHAGOGASTRODUODENOSCOPY  12/01/2011   Procedure: ESOPHAGOGASTRODUODENOSCOPY (EGD);  Surgeon: Lear Ng, MD;  Location: [facility];  Service: Endoscopy;  Laterality: N/A;   HOT HEMOSTASIS  12/01/2011   Procedure: HOT HEMOSTASIS (ARGON PLASMA COAGULATION/BICAP);  Surgeon: Lear Ng, MD;  Location: [facility];  Service: Endoscopy;  Laterality: N/A;   PARTIAL COLECTOMY     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 If you have any questions, ask your nurse or doctor.        alendronate 70 MG tablet Commonly known as:  FOSAMAX Take 70 mg by mouth every Wednesday. Take with a full glass of water on an empty stomach.   carvedilol 3.125 MG tablet Commonly known as:  COREG Take 1 tablet (3.125 mg total) by mouth 2 (two) times daily with a meal.   Ensure Take 237 mLs by mouth 2 (two) times  daily between meals. Due to weight loss   isosorbide mononitrate 30 MG 24 hr tablet Commonly known as:  IMDUR Take 30 mg by mouth daily.   levothyroxine 25 MCG tablet Commonly known as:  SYNTHROID Take 25 mcg by mouth daily before breakfast.   lisinopril 2.5 MG tablet Commonly known as:  ZESTRIL Take 2.5 mg by mouth daily.   mirtazapine 15 MG tablet Commonly known as:  REMERON Take 7.5 mg by mouth at bedtime. 1/2 TAB   PRESERVISION AREDS 2 PO Take 1 capsule by mouth 2 (two) times daily.   Protonix 40 mg/20 mL Pack Generic drug:  pantoprazole sodium Mix 1 packet on 1 teaspoonful of applesauce or 5 mL apple juice and take daily, DO NOT  ADMINISTER WITH ANY OTHER LIQUIDS OR FOODS   QUEtiapine 100 MG tablet Commonly known as:  SEROQUEL Take 150 mg by mouth 2 (two) times daily. Take 1-1/2 tablets to = 150 mg   rivaroxaban 10 MG Tabs tablet Commonly known as:  XARELTO Take 15 mg by mouth daily. 1 and 1/2 tablets to = 15 mg   sertraline 50 MG tablet Commonly known as:  ZOLOFT Take 50 mg by mouth. TAKE 1 AND 1/2 TABLETS (75MG ) BY MOUTH ONCE DAILY MOOD/DEPRESSION   sertraline 25 MG tablet Commonly known as:  ZOLOFT Take 25 mg by mouth daily. Take 1 tablet, along with Sertraline 50 mg to equal 75mg , by mouth daily   simvastatin 10 MG tablet Commonly known as:  ZOCOR Take 10 mg by mouth at bedtime.   traZODone 100 MG tablet Commonly known as:  DESYREL Take 200 mg by mouth at bedtime. Take 2 tablets to = 200 mg   Vitamin D3 1.25 MG (50000 UT) Caps Take 1 capsule by mouth every 30 (thirty) days.       Review of Systems  DATA OBTAINED: from patient GENERAL:  no fevers, fatigue, appetite changes SKIN: No itching, rash HEENT: No complaint RESPIRATORY: No cough, wheezing, SOB CARDIAC: No chest pain, palpitations, lower extremity edema  GI: No abdominal pain, No N/V/D or constipation, No heartburn or reflux  GU: No dysuria, frequency or urgency, or incontinence  MUSCULOSKELETAL: No unrelieved bone/joint pain NEUROLOGIC: No headache, dizziness  PSYCHIATRIC: No overt anxiety or sadness   Immunization History  Administered Date(s) Administered   Influenza-Unspecified 08/26/2016, 10/21/2017   PPD Test 08/16/2016   Pneumococcal Polysaccharide-23 01/08/2012   Pertinent  Health Maintenance Due  Topic Date Due   PNA vac Low Risk Adult (2 of 2 - PCV13) 01/07/2013   DEXA SCAN  05/12/2019 (Originally 08/15/2004)   INFLUENZA VACCINE  06/30/2019   Fall Risk  07/13/2018 07/07/2017  Falls in the past year? Yes No  Number falls in past yr: 1 -  Injury with Fall? No -    Vitals:   05/02/19 1044  BP: 136/80    Pulse: 85  Resp: 20  Temp: (!) 97.5 F (36.4 C)  TempSrc: Oral  SpO2: 93%  Weight: 142 lb 12.8 oz (64.8 kg)  Height: 5' (1.524 m)   Body mass index is 27.89 kg/m.  Physical Exam  GENERAL APPEARANCE: Alert, conversant, No acute distress  SKIN: No diaphoresis rash HEENT: Unremarkable RESPIRATORY: Breathing is even, unlabored. Lung sounds are clear   CARDIOVASCULAR: Heart RRR no murmurs, rubs or gallops. No peripheral edema  GASTROINTESTINAL: Abdomen is soft, non-tender, not distended w/ normal bowel sounds.  GENITOURINARY: Bladder non tender, not distended  MUSCULOSKELETAL: No abnormal joints or musculature NEUROLOGIC: Cranial nerves  2-12 grossly intact. Moves all extremities PSYCHIATRIC: Mood and affect appropriate to situation, no behavioral issues  Labs reviewed: Recent Labs    09/06/18 12/11/18 12/21/18  NA 140 141 142  K 4.0 4.5 4.2  BUN 22* 31* 19  CREATININE 1.0 1.2* 1.0   Recent Labs    09/06/18 12/11/18  AST 15 11*  ALT 11 6*  ALKPHOS 65 61   Recent Labs    09/06/18 12/11/18 12/21/18  WBC 6.8 4.8 5.0  HGB 13.7 12.2 11.7*  HCT 40 36 37  PLT 208 153 162   Recent Labs    12/11/18  CHOL 130  LDLCALC 64  TRIG 84   No results found for: [facility] Lab Results  Component Value Date   TSH 2.50 12/11/2018   Lab Results  Component Value Date   HGBA1C 5.7 12/11/2018   Lab Results  Component Value Date   CHOL 130 12/11/2018   HDL 49 12/11/2018   LDLCALC 64 12/11/2018   TRIG 84 12/11/2018   CHOLHDL 1.9 06/19/2017    Significant Diagnostic Results in last 30 days:  No results found.  Assessment/Plan A-fib (HCC) Stable; continue Xarelto 50 mg daily as prophylaxis and Coreg 3.125 mg twice daily for rate control  Asthma No reported exacerbations; on no meds; continue supportive care  Dysphagia, oropharyngeal Patient has been without aspiration pneumonia; continue current diet    Cindy Mitten D. [name], MD

## 2019-05-05 ENCOUNTER — Encounter: Payer: Self-pay | Admitting: Internal Medicine

## 2019-05-06 ENCOUNTER — Encounter: Payer: Self-pay | Admitting: Internal Medicine

## 2019-05-06 NOTE — Assessment & Plan Note (Signed)
Stable; continue Xarelto 50 mg daily as prophylaxis and Coreg 3.125 mg twice daily for rate control

## 2019-05-06 NOTE — Assessment & Plan Note (Signed)
No reported exacerbations; on no meds; continue supportive care

## 2019-05-06 NOTE — Assessment & Plan Note (Signed)
Patient has been without aspiration pneumonia; continue current diet

## 2019-06-05 ENCOUNTER — Encounter: Payer: Self-pay | Admitting: Internal Medicine

## 2019-06-05 ENCOUNTER — Non-Acute Institutional Stay (SKILLED_NURSING_FACILITY): Payer: Medicare Other | Admitting: Internal Medicine

## 2019-06-05 DIAGNOSIS — I110 Hypertensive heart disease with heart failure: Secondary | ICD-10-CM | POA: Diagnosis not present

## 2019-06-05 DIAGNOSIS — K219 Gastro-esophageal reflux disease without esophagitis: Secondary | ICD-10-CM

## 2019-06-05 DIAGNOSIS — I5022 Chronic systolic (congestive) heart failure: Secondary | ICD-10-CM | POA: Diagnosis not present

## 2019-06-05 NOTE — Progress Notes (Signed)
Location:  [place] Room Number: 305-W Place of Service:  SNF (31)  Cindy Duos, MD  Patient Care Team: Cindy Duos, MD as PCP - General (Internal Medicine)  Extended Emergency Contact Information Primary Emergency Contact: [name],Cindy Address: [address] [place], [place] [address] Cindy [name] of [place] Work Phone: [phone] Mobile Phone: [phone] Relation: Daughter Secondary Emergency Contact: Cindy, [name] Mobile Phone: [phone] Relation: Son    Allergies: Patient has no known allergies.  Chief Complaint  Patient presents with  . Medical Management of Chronic Issues    Routine [facility] visit    HPI: Patient is a 78 y.o. female who is being seen for routine issues of chronic systolic congestive heart failure, hypertension, and GERD.  Past Medical History:  Diagnosis Date  . A-fib ([place]) 01/07/2012  . ANEMIA 07/29/2010   Qualifier: Diagnosis of  By: Bobby Rumpf CMA (AAMA), Patty    . Angina pectoris ([place])   . Anxiety   . Asthma 01/07/2012  . Barrett's esophagus   . Bronchitis   . Cancer ([place])    uterian  . Chest pain   . Coronary artery disease    right carotid occlusion   . Dementia with behavioral disturbance ([place]) 09/02/2016  . Depression   . Dysrhythmia    atrial fibrillation  . GERD (gastroesophageal reflux disease)   . 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 Hyperlipidemia   . Hypertension   . Mood disorder ([place])   . Paranoia ([place]) 10/03/2016  . Recurrent upper respiratory infection (URI)   . Stroke [facility])    2007, after left endarterectomy    Past Surgical History:  Procedure Laterality Date  . ABDOMINAL HYSTERECTOMY    . BALLOON DILATION  10/18/2012   Procedure: BALLOON DILATION;  Surgeon: Lear Ng, MD;  [facility];  Service: Endoscopy;  Laterality: N/A;  . CARDIAC CATHETERIZATION    . CAROTID ENDARTERECTOMY     left, complicated by stroke  .  CHOLECYSTECTOMY     incidental at time of colectomy  . COLONOSCOPY  12/01/2011   Procedure: COLONOSCOPY;  Surgeon: Lear Ng, MD;  [facility];  Service: Endoscopy;  Laterality: N/A;  . COLONOSCOPY  10/18/2012   Procedure: COLONOSCOPY;  Surgeon: Lear Ng, MD;  [facility];  Service: Endoscopy;  Laterality: N/A;  colonic dilation  . ESOPHAGOGASTRODUODENOSCOPY  12/01/2011   Procedure: ESOPHAGOGASTRODUODENOSCOPY (EGD);  Surgeon: Lear Ng, MD;  Location: [facility];  Service: Endoscopy;  Laterality: N/A;  . HOT HEMOSTASIS  12/01/2011   Procedure: HOT HEMOSTASIS (ARGON PLASMA COAGULATION/BICAP);  Surgeon: Lear Ng, MD;  Location: [facility];  Service: Endoscopy;  Laterality: N/A;  . 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 If you have any questions, ask your nurse or doctor.        alendronate 70 MG tablet Commonly known as: FOSAMAX Take 70 mg by mouth every Wednesday. Take with a full glass of water on an empty stomach.   carvedilol 3.125 MG tablet Commonly known as: COREG Take 1 tablet (3.125 mg total) by mouth 2 (two) times daily with a meal.   Ensure Take 237 mLs by  mouth 2 (two) times daily between meals. Due to weight loss   isosorbide mononitrate 30 MG 24 hr tablet Commonly known as: IMDUR Take 30 mg by mouth daily.   levothyroxine 25 MCG tablet Commonly known as: SYNTHROID Take 25 mcg by mouth daily before breakfast.   lisinopril 2.5 MG tablet Commonly known as: ZESTRIL Take 2.5 mg by mouth daily.   mirtazapine 15 MG tablet Commonly known as: REMERON Take 7.5 mg by mouth at bedtime. 1/2 TAB   PRESERVISION AREDS 2 PO Take 1 capsule by mouth 2 (two) times daily.   Protonix 40 mg/20 mL Pack Generic drug: pantoprazole sodium Mix 1 packet on 1  teaspoonful of applesauce or 5 mL apple juice and take daily, DO NOT ADMINISTER WITH ANY OTHER LIQUIDS OR FOODS   QUEtiapine 100 MG tablet Commonly known as: SEROQUEL Take 150 mg by mouth daily. Take 1-1/2 tablets to = 150 mg   QUEtiapine 400 MG 24 hr tablet Commonly known as: SEROQUEL XR Take 400 mg by mouth at bedtime. Take 1 tablet along with a 50 mg tablet to = 450 mg   QUEtiapine 50 MG tablet Commonly known as: SEROQUEL Take 50 mg by mouth at bedtime. Take 1 tablet along with a 400 mg tablet to = 450 mg   rivaroxaban 10 MG Tabs tablet Commonly known as: XARELTO Take 15 mg by mouth daily. 1 and 1/2 tablets to = 15 mg   sertraline 50 MG tablet Commonly known as: ZOLOFT Take 50 mg by mouth. TAKE 1 AND 1/2 TABLETS (75MG ) BY MOUTH ONCE DAILY MOOD/DEPRESSION   sertraline 25 MG tablet Commonly known as: ZOLOFT Take 25 mg by mouth daily. Take 1 tablet, along with Sertraline 50 mg to equal 75mg , by mouth daily   simvastatin 10 MG tablet Commonly known as: ZOCOR Take 10 mg by mouth at bedtime.   traZODone 100 MG tablet Commonly known as: DESYREL Take 100 mg by mouth at bedtime.   Vitamin D3 1.25 MG (50000 UT) Caps Take 1 capsule by mouth every 30 (thirty) days.       No orders of the defined types were placed in this encounter.   Immunization History  Administered Date(s) Administered  . Influenza-Unspecified 08/26/2016, 10/21/2017  . PPD Test 08/16/2016  . Pneumococcal Polysaccharide-23 01/08/2012    Social History   Tobacco Use  . Smoking status: Former Smoker    Quit date: 04/09/2003    Years since quitting: 16.1  . Smokeless tobacco: Never Used  Substance Use Topics  . Alcohol use: No    Review of Systems  DATA OBTAINED: from patient, nurse GENERAL:  no fevers, fatigue, appetite changes SKIN: No itching, rash HEENT: No complaint RESPIRATORY: No cough, wheezing, SOB CARDIAC: No chest pain, palpitations, lower extremity edema  GI: No abdominal pain, No  N/V/D or constipation, No heartburn or reflux  GU: No dysuria, frequency or urgency, or incontinence  MUSCULOSKELETAL: No unrelieved bone/joint pain NEUROLOGIC: No headache, dizziness  PSYCHIATRIC: No overt anxiety or sadness  Vitals:   06/05/19 0813  BP: 127/77  Pulse: 61  Resp: 18  Temp: 98.1 F (36.7 C)  SpO2: 95%   Body mass index is 27.89 kg/m. Physical Exam  GENERAL APPEARANCE: Alert, conversant, No acute distress  SKIN: No diaphoresis rash HEENT: Unremarkable RESPIRATORY: Breathing is even, unlabored. Lung sounds are clear   CARDIOVASCULAR: Heart RRR no murmurs, rubs or gallops. No peripheral edema  GASTROINTESTINAL: Abdomen is soft, non-tender, not distended w/ normal bowel sounds.  GENITOURINARY: Bladder non tender, not distended  MUSCULOSKELETAL: No abnormal joints or musculature NEUROLOGIC: Cranial nerves 2-12 grossly intact. Moves all extremities PSYCHIATRIC: Mood and affect appropriate to situation, no behavioral issues  Patient Active Problem List   Diagnosis Date Noted  . 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 Macular degeneration 12/09/2018  . 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 Insomnia 12/19/2017  . Urinary tract infection due to extended-spectrum beta lactamase (ESBL) producing Escherichia coli 11/17/2017  . Dysphagia, oropharyngeal 11/15/2017  . Altered mental status 11/10/2017  . Acute metabolic encephalopathy 42/35/3614  . Mania ([place]) 06/21/2017  . Hypomagnesemia 06/19/2017  . Essential hypertension 06/18/2017  . Acute encephalopathy 06/17/2017  . Acute lower UTI 06/17/2017  . E. coli UTI 06/17/2017  . Depression 04/24/2017  . Hypothyroidism 10/03/2016  . Paranoia ([place]) 10/03/2016  . LOC (loss of consciousness) ([place]) 09/02/2016  . 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 Dementia with behavioral disturbance ([place]) 09/02/2016  . Asthma with acute exacerbation 08/21/2016  . Atrial fibrillation with RVR ([place]) 08/21/2016  . Elevated INR  08/21/2016  . Chronic systolic heart failure ([place]) 08/21/2016  . Hyperlipidemia 08/21/2016  . Electrolyte abnormality 08/21/2016  . HCAP (healthcare-associated pneumonia) 08/10/2016  . Sepsis ([place]) 08/09/2016  . Coronary atherosclerosis of native coronary artery 01/21/2014  . Obesity 01/17/2014  . Abdominal pain, generalized 10/18/2012  . GERD (gastroesophageal reflux disease)   . Mood disorder ([place])   . Anxiety   . 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[place])   . Stroke ([place])   . Chest pain 01/07/2012  . Asthma 01/07/2012  . A-fib ([place]) 01/07/2012  . 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 Anemia 01/07/2012  . ANEMIA 07/29/2010  . BARRETTS ESOPHAGUS 06/04/2010    CMP     Component Value Date/Time   NA 142 12/21/2018   K 4.2 12/21/2018   CL 103 04/29/2018 0938   CO2 29 04/29/2018 0938   GLUCOSE 102 (H) 04/29/2018 0938   BUN 19 12/21/2018   CREATININE 1.0 12/21/2018   CREATININE 1.00 04/29/2018 0938   CALCIUM 9.6 04/29/2018 0938   PROT 6.9 11/08/2017 1512   ALBUMIN 3.8 11/08/2017 1512   AST 11 (A) 12/11/2018   ALT 6 (A) 12/11/2018   ALKPHOS 61 12/11/2018   BILITOT 0.9 11/08/2017 1512   GFRNONAA 53 (L) 04/29/2018 0938   GFRAA >60 04/29/2018 0938   Recent Labs    09/06/18 12/11/18 12/21/18  NA 140 141 142  K 4.0 4.5 4.2  BUN 22* 31* 19  CREATININE 1.0 1.2* 1.0   Recent Labs    09/06/18 12/11/18  AST 15 11*  ALT 11 6*  ALKPHOS 65 61   Recent Labs    09/06/18 12/11/18 12/21/18  WBC 6.8 4.8 5.0  HGB 13.7 12.2 11.7*  HCT 40 36 37  PLT 208 153 162   Recent Labs    12/11/18  CHOL 130  LDLCALC 64  TRIG 84   No results found for: [facility] Lab Results  Component Value Date   TSH 2.50 12/11/2018   Lab Results  Component Value Date   HGBA1C 5.7 12/11/2018   Lab Results  Component Value Date   CHOL 130 12/11/2018   HDL 49 12/11/2018   LDLCALC 64 12/11/2018   TRIG 84 12/11/2018   CHOLHDL 1.9 06/19/2017     Significant Diagnostic Results in last 30 days:  No results found.  Assessment and Plan  Chronic systolic heart failure ([place]) Continues without exacerbation; continue  Coreg 3.125 mg twice daily; patient is not on diuretic  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HCC) Controlled; continue Coreg 3.125 mg twice daily and Imdur 30 mg daily  GERD (gastroesophageal reflux disease) No reports of reflux or complaints of reflux; continue Protonix 40 mg daily     Cindy Duos, MD

## 2019-06-08 IMAGING — CT CT PELVIS W/O CM
2 of 4 series · 16 of 46 positions shown, 18 images · non-contrast
Comparison: Pelvic radiograph 04/26/2016. CT abdomen and pelvis
05/29/2012.

CLINICAL DATA: Bilateral hip pain after a fall today. Initial
encounter.

EXAM:
CT PELVIS WITHOUT CONTRAST
TECHNIQUE: Multidetector CT imaging of the pelvis was performed following the
standard protocol without intravenous contrast.

[Series 3: pelvis w/o 5.0 · axial · non-contrast · 0.70mm/px · z∈[+899,+1109]mm · 13 of 50 slices shown, 15 images]
[im 4/50  soft-tissue]
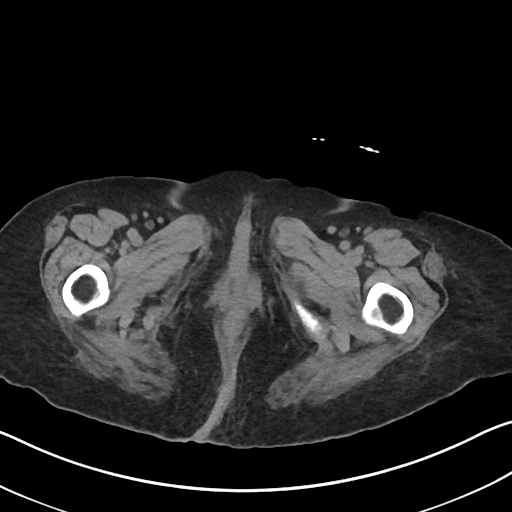
[im 4/50  bone]
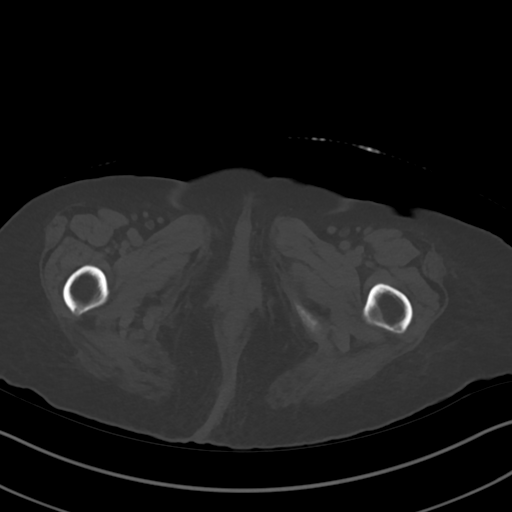
[im 7/50  soft-tissue]
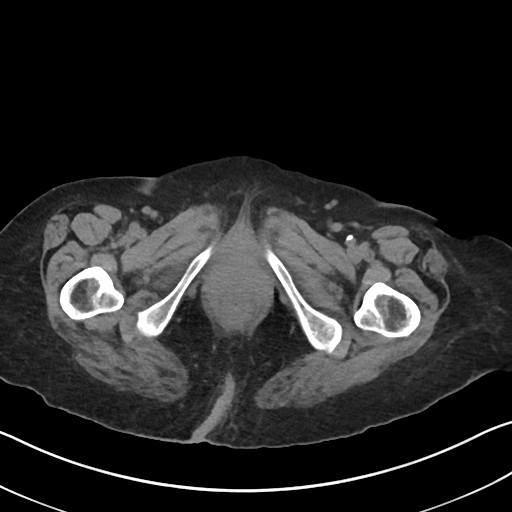
[im 10/50  soft-tissue]
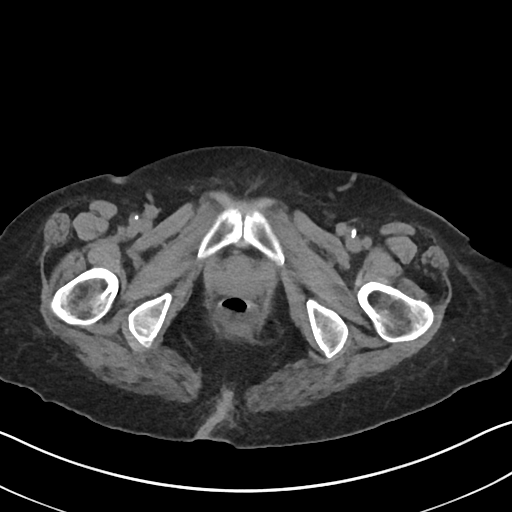
[im 14/50  soft-tissue]
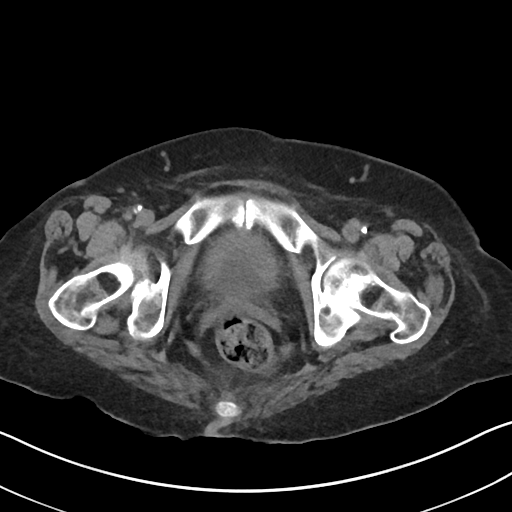
[im 17/50  soft-tissue]
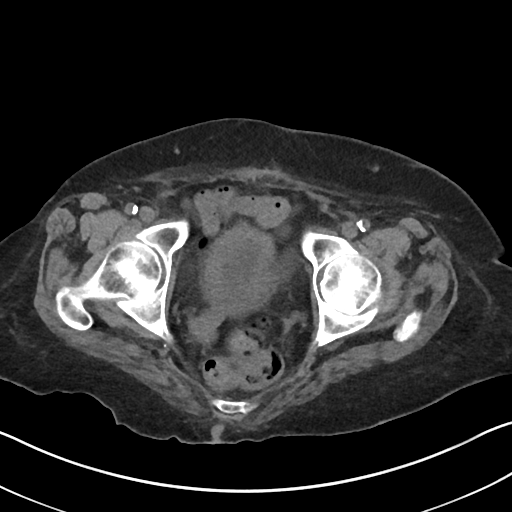
[im 20/50  soft-tissue]
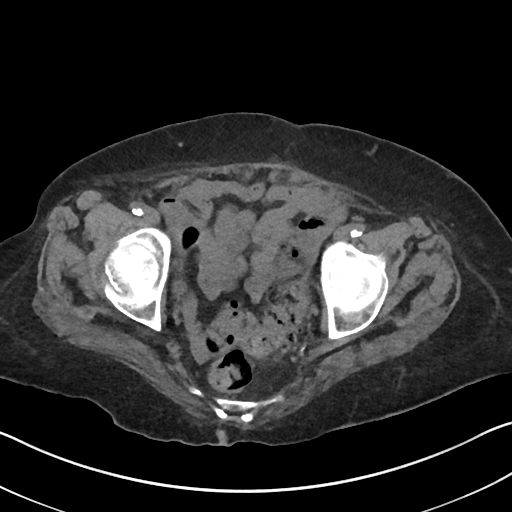
[im 27/50  soft-tissue]
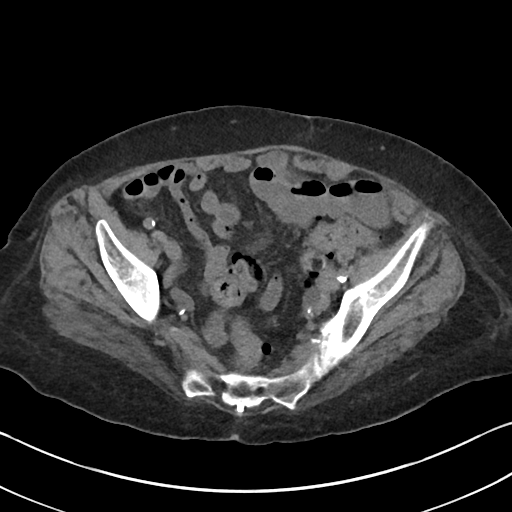
[im 30/50  soft-tissue]
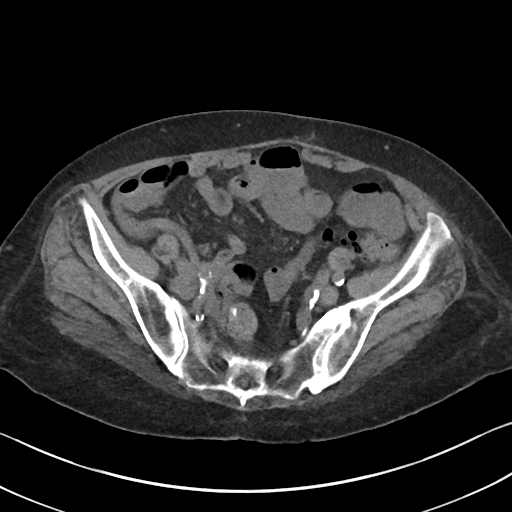
[im 33/50  soft-tissue]
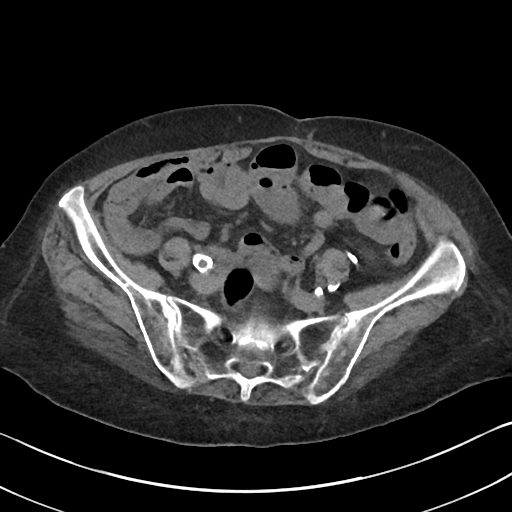
[im 33/50  bone]
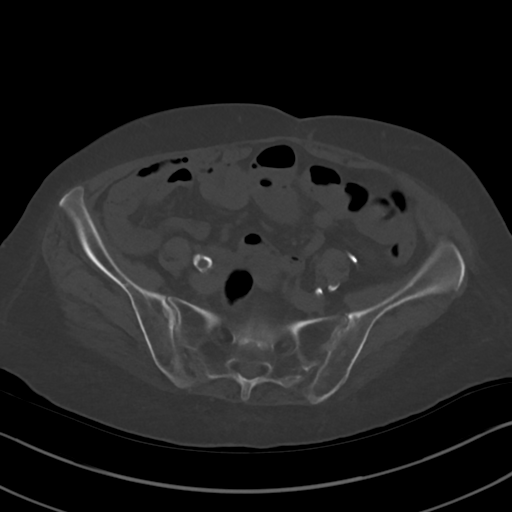
[im 36/50  soft-tissue]
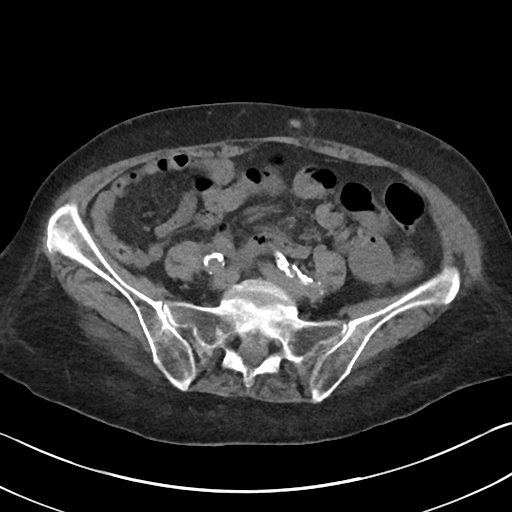
[im 40/50  soft-tissue]
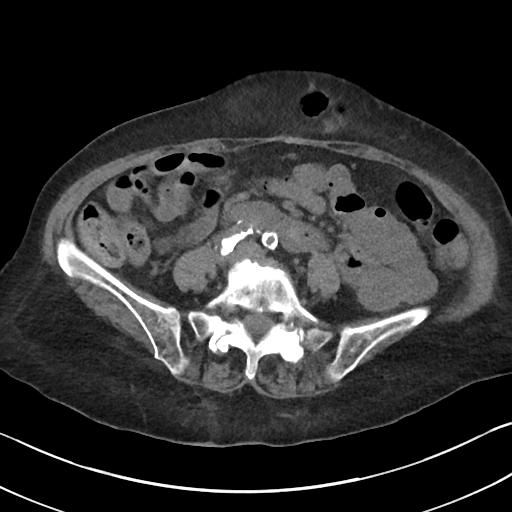
[im 43/50  soft-tissue]
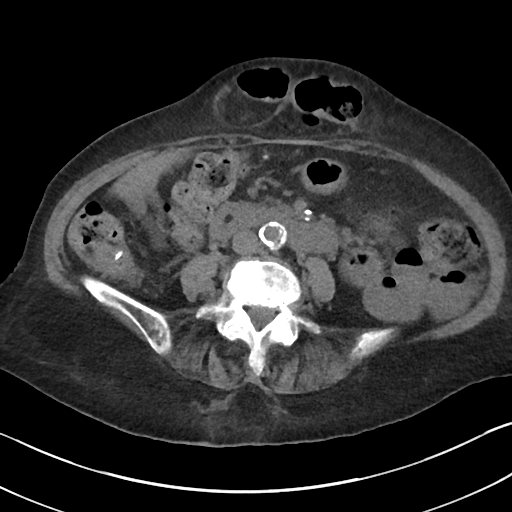
[im 46/50  soft-tissue]
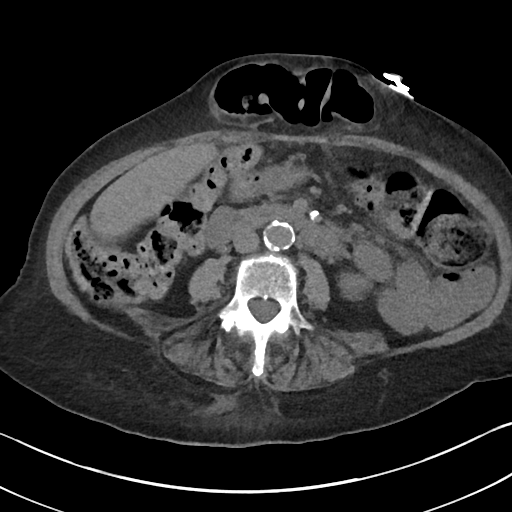

[Series 5: pelvis w/o 2.0 cor · coronal · non-contrast · 0.57mm/px · 3 of 151 slices shown]
[im 51/151  soft-tissue]
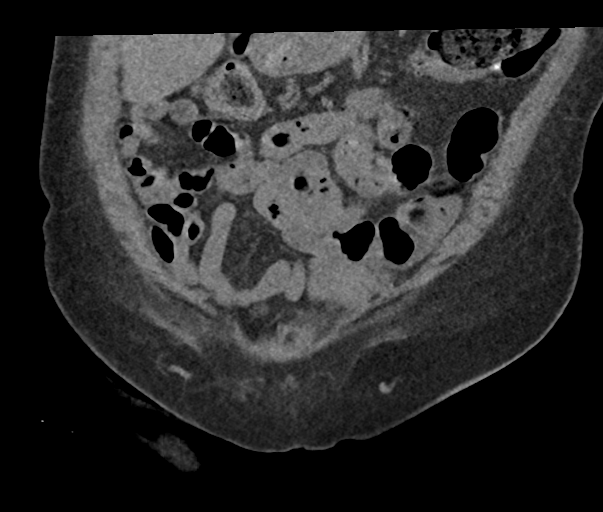
[im 67/151  soft-tissue]
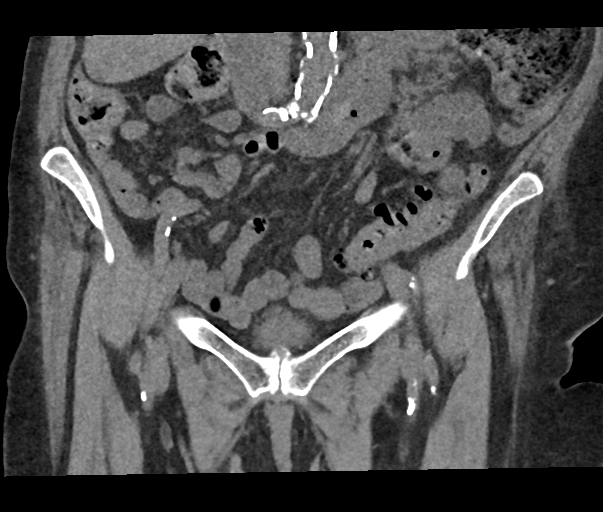
[im 84/151  soft-tissue]
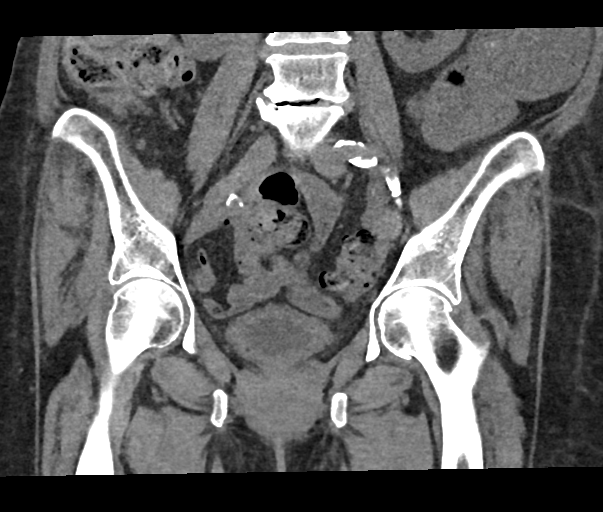

[16 of 46 positions shown; findings below may reference images not displayed]

FINDINGS: Urinary Tract: No distal ureteral dilatation. Under distended
bladder though with apparent moderate diffuse bladder wall
thickening.

Bowel: Mild sigmoid colon diverticulosis without evidence of
diverticulitis. Partially visualized colonic dilatation near the
splenic flexure at an anastomotic staple line, chronic based on the
9842 CT. Partially visualized supraumbilical hernia now containing a
portion of transverse colon without evidence of proximal bowel
dilatation to indicate obstruction.

Vascular/Lymphatic: Extensive aortoiliac atherosclerosis. No
enlarged lymph nodes.

Reproductive:  Status post hysterectomy.  No adnexal mass.

Other:  No intraperitoneal free fluid.

Musculoskeletal: No acute fracture identified. No hip dislocation.
Moderate left greater than right hip osteoarthrosis with joint space
narrowing, marginal osteophyte formation, and subchondral cyst
formation. Degenerative changes of the pubic symphysis and SI
joints. Focally advanced disc degeneration at L4-5. Advanced L5-S1
facet arthrosis with grade 1 anterolisthesis at this level as well
as at L3-4.
IMPRESSION: 1. Bilateral hip osteoarthrosis without evidence of acute osseous
abnormality.
2. Supraumbilical hernia now containing a portion of transverse
colon, incompletely imaged.
3. Diffuse bladder wall thickening, correlate for cystitis.
4.  Aortic Atherosclerosis ([id number]).

## 2019-06-09 ENCOUNTER — Encounter: Payer: Self-pay | Admitting: Internal Medicine

## 2019-06-09 NOTE — Assessment & Plan Note (Signed)
Controlled; continue Coreg 3.125 mg twice daily and Imdur 30 mg daily

## 2019-06-09 NOTE — Assessment & Plan Note (Signed)
Continues without exacerbation; continue Coreg 3.125 mg twice daily; patient is not on diuretic

## 2019-06-09 NOTE — Assessment & Plan Note (Signed)
No reports of reflux or complaints of reflux; continue Protonix 40 mg daily

## 2019-07-05 ENCOUNTER — Encounter: Payer: Self-pay | Admitting: Internal Medicine

## 2019-07-05 NOTE — Progress Notes (Signed)
Location:  [facility] Room Number: 305-W Place of Service:  SNF [phone]) Provider:  Granville Lewis, PA-C  [name], MD  Patient Care Team: [name], MD as PCP - General (Internal Medicine)  Extended Emergency Contact Information Primary Emergency Contact: Tyner,[name] Address: [address] [place], [place] [address] [name] Work Phone: [phone] Mobile Phone: [phone] Relation: Daughter Secondary Emergency Contact: Analysia, Dungee Mobile Phone: [phone] Relation: Son  Code Status:  DNR Goals of care: Advanced Directive information Advanced Directives 06/05/2019  Does Patient Have a Medical Advance Directive? Yes  Type of Advance Directive Out of facility DNR (pink MOST or yellow form)  Does patient want to make changes to medical advance directive? No - Patient declined  Copy of [place] in Chart? -  Would patient like information on creating a medical advance directive? -  Pre-existing out of facility DNR order (yellow form or pink MOST form) Yellow form placed in chart (order not valid for inpatient use)     Chief Complaint  Patient presents with  . Medical Management of Chronic Issues    Routine [facility] SNF visit  . Immunizations    Prevnar    HPI:  Pt is a 78 y.o. female seen today for medical management of chronic diseases.     Past Medical History:  Diagnosis Date  . A-fib ([place]) 01/07/2012  . ANEMIA 07/29/2010   Qualifier: Diagnosis of  By: Bobby Rumpf CMA (AAMA), Patty    . Angina pectoris ([place])   . Anxiety   . Asthma 01/07/2012  . Barrett's esophagus   . Bronchitis   . 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 Chest pain   . Coronary artery disease    right carotid occlusion   . Dementia with behavioral disturbance ([place]) 09/02/2016  . Depression   . Dysrhythmia    atrial fibrillation  . GERD (gastroesophageal reflux disease)   . 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  Hyperlipidemia   . Hypertension   . Mood disorder ([place])   . Paranoia ([place]) 10/03/2016  . Recurrent upper respiratory infection (URI)   . Stroke [facility])    2007, after left endarterectomy   Past Surgical History:  Procedure Laterality Date  . ABDOMINAL HYSTERECTOMY    . BALLOON DILATION  10/18/2012   Procedure: BALLOON DILATION;  Surgeon: Lear Ng, MD;  [facility]: [facility];  Service: Endoscopy;  Laterality: N/A;  . CARDIAC CATHETERIZATION    . CAROTID ENDARTERECTOMY     left, complicated by stroke  . CHOLECYSTECTOMY     incidental at time of colectomy  . COLONOSCOPY  12/01/2011   Procedure: COLONOSCOPY;  Surgeon: Lear Ng, MD;  [facility];  Service: Endoscopy;  Laterality: N/A;  . COLONOSCOPY  10/18/2012   Procedure: COLONOSCOPY;  Surgeon: Lear Ng, MD;  [facility];  Service: Endoscopy;  Laterality: N/A;  colonic dilation  . ESOPHAGOGASTRODUODENOSCOPY  12/01/2011   Procedure: ESOPHAGOGASTRODUODENOSCOPY (EGD);  Surgeon: Lear Ng, MD;  Location[facility];  Service: Endoscopy;  Laterality: N/A;  . HOT HEMOSTASIS  12/01/2011   Procedure: HOT HEMOSTASIS (ARGON PLASMA COAGULATION/BICAP);  Surgeon: Lear Ng, MD;  Location: [facility];  Service: Endoscopy;  Laterality: N/A;  . 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 alendronate (FOSAMAX) 70 MG tablet Take 70 mg by mouth every Wednesday. Take with a full glass of water on an empty stomach.  . carvedilol (COREG) 3.125 MG tablet Take 1 tablet (3.125 mg total) by mouth 2 (two) times daily with a meal.  . Cholecalciferol (VITAMIN D3) 1.25 MG (50000 UT) CAPS Take 1 capsule by mouth once a week.   . Ensure (ENSURE) Take 237 mLs by mouth 2 (two) times daily between meals. Due to weight loss  . isosorbide mononitrate (IMDUR) 30 MG 24 hr  tablet Take 30 mg by mouth daily.  [place] levothyroxine (SYNTHROID, LEVOTHROID) 25 MCG tablet Take 25 mcg by mouth daily before breakfast.   . lisinopril (PRINIVIL,ZESTRIL) 2.5 MG tablet Take 2.5 mg by mouth daily.  . mirtazapine (REMERON) 15 MG tablet Take 7.5 mg by mouth at bedtime. 1/2 TAB  . Multiple Vitamins-Minerals (PRESERVISION AREDS 2 PO) Take 1 capsule by mouth 2 (two) times daily.   . pantoprazole sodium (PROTONIX) 40 mg/20 mL PACK Mix 1 packet on 1 teaspoonful of applesauce or 5 mL apple juice and take daily, DO NOT ADMINISTER WITH ANY OTHER LIQUIDS OR FOODS  . QUEtiapine (SEROQUEL XR) 400 MG 24 hr tablet Take 400 mg by mouth at bedtime. Take 1 tablet along with a 50 mg tablet to = 450 mg  . QUEtiapine (SEROQUEL) 100 MG tablet Take 150 mg by mouth daily. Take 1-1/2 tablets to = 150 mg  . QUEtiapine (SEROQUEL) 50 MG tablet Take 50 mg by mouth at bedtime. Take 1 tablet along with a 400 mg tablet to = 450 mg  . rivaroxaban (XARELTO) 10 MG TABS tablet Take 15 mg by mouth daily. 1 and 1/2 tablets to = 15 mg  . sertraline (ZOLOFT) 25 MG tablet Take 25 mg by mouth daily. Take 1 tablet, along with Sertraline 50 mg to equal 75mg , by mouth daily  . sertraline (ZOLOFT) 50 MG tablet Take 50 mg by mouth. TAKE 1 AND 1/2 TABLETS (75MG ) BY MOUTH ONCE DAILY MOOD/DEPRESSION  . simvastatin (ZOCOR) 10 MG tablet Take 10 mg by mouth at bedtime.  . traZODone (DESYREL) 100 MG tablet Take 100 mg by mouth at bedtime.    No facility-administered encounter medications on file as of 07/05/2019.     Review of Systems  Immunization History  Administered Date(s) Administered  . Influenza-Unspecified 08/26/2016, 10/21/2017  . PPD Test 08/16/2016  . Pneumococcal Polysaccharide-23 01/08/2012   Pertinent  Health Maintenance Due  Topic Date Due  . INFLUENZA VACCINE  06/30/2019  . DEXA SCAN  11/30/2019 (Originally 08/15/2004)  . PNA vac Low Risk Adult (2 of 2 - PCV13) 11/30/2019 (Originally 01/07/2013)   Fall Risk   07/13/2018 07/07/2017  Falls in the past year? Yes No  Number falls in past yr: 1 -  Injury with Fall? No -   Functional Status Survey:    Vitals:   07/05/19 1001  BP: (!) 109/59  Pulse: 60  Resp: (!) 22  Temp: 99.1 F (37.3 C)  TempSrc: Oral  SpO2: 95%  Weight: 142 lb 12.8 oz (64.8 kg)  Height: 5' (1.524 m)   Body mass index is 27.89 kg/m. Physical Exam  Labs reviewed: Recent Labs    09/06/18 12/11/18 12/21/18  NA 140 141 142  K 4.0 4.5 4.2  BUN 22* 31* 19  CREATININE 1.0 1.2* 1.0   Recent Labs    09/06/18 12/11/18  AST 15 11*  ALT 11 6*  ALKPHOS 65 61   Recent Labs    09/06/18 12/11/18 12/21/18  WBC 6.8 4.8 5.0  HGB 13.7 12.2 11.7*  HCT 40 36 37  PLT 208 153 162   Lab Results  Component Value Date   TSH 2.50 12/11/2018   Lab Results  Component Value Date   HGBA1C 5.7 12/11/2018   Lab Results  Component Value Date   CHOL 130 12/11/2018   HDL 49 12/11/2018   LDLCALC 64 12/11/2018   TRIG 84 12/11/2018   CHOLHDL 1.9 06/19/2017    Significant Diagnostic Results in last 30 days:  No results found.  Assessment/Plan There are no diagnoses linked to this encounter.

## 2019-07-06 ENCOUNTER — Non-Acute Institutional Stay (SKILLED_NURSING_FACILITY): Payer: Medicare Other | Admitting: Internal Medicine

## 2019-07-06 DIAGNOSIS — B999 Unspecified infectious disease: Secondary | ICD-10-CM

## 2019-07-06 DIAGNOSIS — U071 COVID-19: Secondary | ICD-10-CM

## 2019-07-06 NOTE — Progress Notes (Signed)
Location:  [facility] Room Number: 107-P Place of Service:  SNF (31)  Cindy Duos, MD  Patient Care Team: Cindy Duos, MD as PCP - General (Internal Medicine)  Extended Emergency Contact Information Primary Emergency Contact: Cindy,[name] Address: [address] [place], [place] [address] [name] of [name] Work Phone: [phone] Mobile Phone: [phone] Relation: Daughter Secondary Emergency Contact: Cindy, [name] Mobile Phone: [phone] Relation: Son    Allergies: Patient has no known allergies.  Chief Complaint  Patient presents with  . Acute Visit    COVID-19 positive    HPI: Patient is a 78 y.o. female who is being seen because her 7/28 test from the health department has come back positive for COVID-19 and she has been placed in the isolation unit.  Patient is completely asymptomatic.  Past Medical History:  Diagnosis Date  . A-fib ([place]) 01/07/2012  . ANEMIA 07/29/2010   Qualifier: Diagnosis of  By: Bobby Rumpf CMA (AAMA), Patty    . Angina pectoris ([place])   . Anxiety   . Asthma 01/07/2012  . Barrett's esophagus   . Bronchitis   . 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 Chest pain   . Coronary artery disease    right carotid occlusion   . Dementia with behavioral disturbance ([place]) 09/02/2016  . Depression   . Dysrhythmia    atrial fibrillation  . GERD (gastroesophageal reflux disease)   . 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 Hyperlipidemia   . Hypertension   . Mood disorder ([place])   . Paranoia ([place]) 10/03/2016  . Recurrent upper respiratory infection (URI)   . Stroke [facility])    2007, after left endarterectomy    Past Surgical History:  Procedure Laterality Date  . ABDOMINAL HYSTERECTOMY    . BALLOON DILATION  10/18/2012   Procedure: BALLOON DILATION;  Surgeon: Lear Ng, MD;  [facility];  Service: Endoscopy;  Laterality: N/A;  . CARDIAC CATHETERIZATION    . CAROTID ENDARTERECTOMY      left, complicated by stroke  . CHOLECYSTECTOMY     incidental at time of colectomy  . COLONOSCOPY  12/01/2011   Procedure: COLONOSCOPY;  Surgeon: Lear Ng, MD;  [facility];  Service: Endoscopy;  Laterality: N/A;  . COLONOSCOPY  10/18/2012   Procedure: COLONOSCOPY;  Surgeon: Lear Ng, MD;  [facility];  Service: Endoscopy;  Laterality: N/A;  colonic dilation  . ESOPHAGOGASTRODUODENOSCOPY  12/01/2011   Procedure: ESOPHAGOGASTRODUODENOSCOPY (EGD);  Surgeon: Lear Ng, MD;  Location: [facility];  Service: Endoscopy;  Laterality: N/A;  . HOT HEMOSTASIS  12/01/2011   Procedure: HOT HEMOSTASIS (ARGON PLASMA COAGULATION/BICAP);  Surgeon: Lear Ng, MD;  Location: [facility];  Service: Endoscopy;  Laterality: N/A;  . 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 If you have any questions, ask your nurse or doctor.        alendronate 70 MG tablet Commonly known as: FOSAMAX Take 70 mg by mouth every Wednesday. Take with a full glass of water on an empty stomach.   carvedilol 3.125 MG tablet Commonly known as: COREG Take 1 tablet (3.125 mg total) by mouth 2 (two) times  daily with a meal.   Ensure Take 237 mLs by mouth 2 (two) times daily between meals. Due to weight loss   isosorbide mononitrate 30 MG 24 hr tablet Commonly known as: IMDUR Take 30 mg by mouth daily.   levothyroxine 25 MCG tablet Commonly known as: SYNTHROID Take 25 mcg by mouth daily before breakfast.   lisinopril 2.5 MG tablet Commonly known as: ZESTRIL Take 2.5 mg by mouth daily.   mirtazapine 15 MG tablet Commonly known as: REMERON Take 7.5 mg by mouth at bedtime. 1/2 TAB   PRESERVISION AREDS 2 PO Take 1 capsule by mouth 2 (two) times daily.   Protonix 40 mg/20 mL Pack Generic drug:  pantoprazole sodium Mix 1 packet on 1 teaspoonful of applesauce or 5 mL apple juice and take daily, DO NOT ADMINISTER WITH ANY OTHER LIQUIDS OR FOODS   QUEtiapine 100 MG tablet Commonly known as: SEROQUEL Take 150 mg by mouth daily. Take 1-1/2 tablets to = 150 mg   QUEtiapine 400 MG 24 hr tablet Commonly known as: SEROQUEL XR Take 400 mg by mouth at bedtime. Take 1 tablet along with a 50 mg tablet to = 450 mg   QUEtiapine 50 MG tablet Commonly known as: SEROQUEL Take 50 mg by mouth at bedtime. Take 1 tablet along with a 400 mg tablet to = 450 mg   rivaroxaban 10 MG Tabs tablet Commonly known as: XARELTO Take 15 mg by mouth daily. 1 and 1/2 tablets to = 15 mg   sertraline 50 MG tablet Commonly known as: ZOLOFT Take 50 mg by mouth. TAKE 1 AND 1/2 TABLETS (75MG ) BY MOUTH ONCE DAILY MOOD/DEPRESSION   sertraline 25 MG tablet Commonly known as: ZOLOFT Take 25 mg by mouth daily. Take 1 tablet, along with Sertraline 50 mg to equal 75mg , by mouth daily   simvastatin 10 MG tablet Commonly known as: ZOCOR Take 10 mg by mouth at bedtime.   traZODone 100 MG tablet Commonly known as: DESYREL Take 100 mg by mouth at bedtime.   Vitamin D3 1.25 MG (50000 UT) Caps Take 1 capsule by mouth once a week.       No orders of the defined types were placed in this encounter.   Immunization History  Administered Date(s) Administered  . Influenza-Unspecified 08/26/2016, 10/21/2017  . PPD Test 08/16/2016  . Pneumococcal Polysaccharide-23 01/08/2012    Social History   Tobacco Use  . Smoking status: Former Smoker    Quit date: 04/09/2003    Years since quitting: 16.2  . Smokeless tobacco: Never Used  Substance Use Topics  . Alcohol use: No    Review of Systems  DATA OBTAINED: from patient, nurse GENERAL:  no fevers, fatigue, appetite changes SKIN: No itching, rash HEENT: No complaint RESPIRATORY: No cough, wheezing, SOB CARDIAC: No chest pain, palpitations, lower extremity edema   GI: No abdominal pain, No N/V/D or constipation, No heartburn or reflux  GU: No dysuria, frequency or urgency, or incontinence  MUSCULOSKELETAL: No unrelieved bone/joint pain NEUROLOGIC: No headache, dizziness  PSYCHIATRIC: No overt anxiety or sadness  Vitals:   07/06/19 1317  BP: 140/73  Pulse: 64  Resp: 17  Temp: 99.1 F (37.3 C)  SpO2: 95%   Body mass index is 27.89 kg/m. Physical Exam  GENERAL APPEARANCE: Alert, conversant, No acute distress  SKIN: No diaphoresis rash HEENT: Unremarkable RESPIRATORY: Breathing is even, unlabored. Lung sounds are clear   CARDIOVASCULAR: Heart RRR no murmurs, rubs or gallops. No peripheral edema  GASTROINTESTINAL: Abdomen  is soft, non-tender, not distended w/ normal bowel sounds.  GENITOURINARY: Bladder non tender, not distended  MUSCULOSKELETAL: No abnormal joints or musculature NEUROLOGIC: Cranial nerves 2-12 grossly intact. Moves all extremities PSYCHIATRIC: Mood and affect appropriate to situation with dementia, no behavioral issues  Patient Active Problem List   Diagnosis Date Noted  . 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 Macular degeneration 12/09/2018  . 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 Insomnia 12/19/2017  . Urinary tract infection due to extended-spectrum beta lactamase (ESBL) producing Escherichia coli 11/17/2017  . Dysphagia, oropharyngeal 11/15/2017  . Altered mental status 11/10/2017  . Acute metabolic encephalopathy 34/19/3790  . Mania ([place]) 06/21/2017  . Hypomagnesemia 06/19/2017  . Essential hypertension 06/18/2017  . Acute encephalopathy 06/17/2017  . Acute lower UTI 06/17/2017  . E. coli UTI 06/17/2017  . Depression 04/24/2017  . Hypothyroidism 10/03/2016  . Paranoia ([place]) 10/03/2016  . LOC (loss of consciousness) ([place]) 09/02/2016  . 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 Dementia with behavioral disturbance ([place]) 09/02/2016  . Asthma with acute exacerbation 08/21/2016  . Atrial fibrillation with  RVR ([place]) 08/21/2016  . Elevated INR 08/21/2016  . Chronic systolic heart failure ([place]) 08/21/2016  . Hyperlipidemia 08/21/2016  . Electrolyte abnormality 08/21/2016  . HCAP (healthcare-associated pneumonia) 08/10/2016  . Sepsis ([place]) 08/09/2016  . Coronary atherosclerosis of native coronary artery 01/21/2014  . Obesity 01/17/2014  . Abdominal pain, generalized 10/18/2012  . GERD (gastroesophageal reflux disease)   . Mood disorder ([place])   . Anxiety   . 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 Stroke ([place])   . Chest pain 01/07/2012  . Asthma 01/07/2012  . A-fib ([place]) 01/07/2012  . 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 Anemia 01/07/2012  . ANEMIA 07/29/2010  . BARRETTS ESOPHAGUS 06/04/2010    CMP     Component Value Date/Time   NA 142 12/21/2018   K 4.2 12/21/2018   CL 103 04/29/2018 0938   CO2 29 04/29/2018 0938   GLUCOSE 102 (H) 04/29/2018 0938   BUN 19 12/21/2018   CREATININE 1.0 12/21/2018   CREATININE 1.00 04/29/2018 0938   CALCIUM 9.6 04/29/2018 0938   PROT 6.9 11/08/2017 1512   ALBUMIN 3.8 11/08/2017 1512   AST 11 (A) 12/11/2018   ALT 6 (A) 12/11/2018   ALKPHOS 61 12/11/2018   BILITOT 0.9 11/08/2017 1512   GFRNONAA 53 (L) 04/29/2018 0938   GFRAA >60 04/29/2018 0938   Recent Labs    09/06/18 12/11/18 12/21/18  NA 140 141 142  K 4.0 4.5 4.2  BUN 22* 31* 19  CREATININE 1.0 1.2* 1.0   Recent Labs    09/06/18 12/11/18  AST 15 11*  ALT 11 6*  ALKPHOS 65 61   Recent Labs    09/06/18 12/11/18 12/21/18  WBC 6.8 4.8 5.0  HGB 13.7 12.2 11.7*  HCT 40 36 37  PLT 208 153 162   Recent Labs    12/11/18  CHOL 130  LDLCALC 64  TRIG 84   No results found for: [facility] Lab Results  Component Value Date   TSH 2.50 12/11/2018   Lab Results  Component Value Date   HGBA1C 5.7 12/11/2018   Lab Results  Component Value Date   CHOL 130 12/11/2018   HDL 49 12/11/2018   LDLCALC 64 12/11/2018   TRIG 84  12/11/2018   CHOLHDL 1.9 06/19/2017    Significant Diagnostic Results in last 30 days:  No results found.  Assessment  and Plan  COVID-19 positive/isolation for airborne infectious diseases- patient is asymptomatic; according to [place] asymptomatic patient is only need to be isolated for 10 days and today is day 10 so she will be leaving; until then patient requires full PPE for evaluation; continue supportive care, continue vigilant observation     Cindy Duos, MD

## 2019-07-06 NOTE — Progress Notes (Deleted)
This encounter was created in error - please disregard.

## 2019-07-09 ENCOUNTER — Encounter: Payer: Self-pay | Admitting: Internal Medicine

## 2019-07-09 DIAGNOSIS — B999 Unspecified infectious disease: Secondary | ICD-10-CM | POA: Insufficient documentation

## 2019-07-09 DIAGNOSIS — U071 COVID-19: Secondary | ICD-10-CM | POA: Insufficient documentation

## 2019-07-16 ENCOUNTER — Encounter: Payer: Self-pay | Admitting: Internal Medicine

## 2019-07-16 ENCOUNTER — Non-Acute Institutional Stay (SKILLED_NURSING_FACILITY): Payer: Medicare Other | Admitting: Internal Medicine

## 2019-07-16 DIAGNOSIS — F0281 Dementia in other diseases classified elsewhere with behavioral disturbance: Secondary | ICD-10-CM

## 2019-07-16 DIAGNOSIS — U071 COVID-19: Secondary | ICD-10-CM | POA: Diagnosis not present

## 2019-07-16 DIAGNOSIS — I5022 Chronic systolic (congestive) heart failure: Secondary | ICD-10-CM

## 2019-07-16 DIAGNOSIS — I10 Essential (primary) hypertension: Secondary | ICD-10-CM | POA: Diagnosis not present

## 2019-07-16 DIAGNOSIS — I480 Paroxysmal atrial fibrillation: Secondary | ICD-10-CM | POA: Diagnosis not present

## 2019-07-16 DIAGNOSIS — G301 Alzheimer's disease with late onset: Secondary | ICD-10-CM

## 2019-07-16 DIAGNOSIS — K219 Gastro-esophageal reflux disease without esophagitis: Secondary | ICD-10-CM

## 2019-07-16 DIAGNOSIS — F02818 Dementia in other diseases classified elsewhere, unspecified severity, with other behavioral disturbance: Secondary | ICD-10-CM

## 2019-07-16 NOTE — Progress Notes (Signed)
Location:  [facility] Number: 305-D Place of Service:  SNF [phone]) Provider:  Granville Lewis, PA-C  [name], MD  Patient Care Team: [name], MD as PCP - General (Internal Medicine)  Extended Emergency Contact Information Primary Emergency Contact: Tyner,[name] Address: [address] [place], [place] [address] [name] Work Phone: [phone] Mobile Phone: [phone] Relation: Daughter Secondary Emergency Contact: Wileen, [place] Mobile Phone: [phone] Relation: Son  Code Status:  DNR Goals of care: Advanced Directive information Advanced Directives 07/06/2019  Does Patient Have a Medical Advance Directive? Yes  Type of Advance Directive Out of facility DNR (pink MOST or yellow form)  Does patient want to make changes to medical advance directive? No - Patient declined  Copy of [place] in Chart? -  Would patient like information on creating a medical advance directive? -  Pre-existing out of facility DNR order (yellow form or pink MOST form) Yellow form placed in chart (order not valid for inpatient use)     Chief Complaint  Patient presents with  . Medical Management of Chronic Issues    Routine [facility] visit  Medical management of chronic medical conditions including systolic CHF-hypertension- GERD with Barrett's esophagus- atrial fibrillation- history of CVA-hypothyroidism- dementia with behaviors- hyperlipidemia-  HPI:  Pt is a 78 y.o. female seen today for medical management of chronic diseases.  As noted above.  Most recent acute issue was a positive test for COVID-19  however she was in isolation and remained asymptomatic and she has returned to her room after 10-day isolation.  She continues to be asymptomatic she does not have any complaints.  She is a somewhat poor historian because of dementia.  In regards to her other medical issues she does have a history of  systolic CHF her edema appears to be fairly minimal her weight appears to be stable she does not complain of any increased shortness of breath.  She is on Coreg as well as low-dose lisinopril and Imdur she is not on a diuretic.  Regards atrial fibrillation this appears rate controlled on low-dose Coreg-she at times does have pulses in the 50s but appears to be asymptomatic- she is on Xarelto for anticoagulation.  She also history of CVA she is on aspirin as well as a statin her LDL was 64 on lab done in January she is also on low-dose lisinopril and Coreg for blood pressure control which appears to be stable recent blood pressures 114/68-101/77-111/59-I see one 155/85 but this is fairly unusual.  Regards to hypothyroidism she is on Synthroid 25 mcg a day TSH was 2.5 back in April.  She does have a history of dementia with mood disorder she is on Seroquel twice daily 150 mg earlier in the day and 450 mg at night.  She also continues on Zoloft 75 mg a day and Remeron 7.5 mg a day.  Currently she is lying in bed comfortably she is pleasant and cooperative with exam although she is a little irritated with the exam.     Past Medical History:  Diagnosis Date  . A-fib ([place]) 01/07/2012  . ANEMIA 07/29/2010   Qualifier: Diagnosis of  By: Bobby Rumpf CMA (AAMA), [place]    . Angina pectoris ([place])   . Anxiety   . Asthma 01/07/2012  . Barrett's esophagus   . Bronchitis   . 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  Chest pain   . Coronary artery disease    right carotid occlusion   . Dementia with behavioral disturbance ([place]) 09/02/2016  . Depression   . Dysrhythmia    atrial fibrillation  . GERD (gastroesophageal reflux disease)   . 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 Hyperlipidemia   . Hypertension   . Mood disorder ([place])   . Paranoia ([place]) 10/03/2016  . Recurrent upper respiratory infection (URI)   . Stroke [facility])    2007, after left endarterectomy   Past Surgical History:  Procedure Laterality  Date  . ABDOMINAL HYSTERECTOMY    . BALLOON DILATION  10/18/2012   Procedure: BALLOON DILATION;  Surgeon: Lear Ng, MD;  Location: [facility] ENDOSCOPY;  Service: Endoscopy;  Laterality: N/A;  . CARDIAC CATHETERIZATION    . CAROTID ENDARTERECTOMY     left, complicated by stroke  . CHOLECYSTECTOMY     incidental at time of colectomy  . COLONOSCOPY  12/01/2011   Procedure: COLONOSCOPY;  Surgeon: Lear Ng, MD;  Location: [facility] ENDOSCOPY;  Service: Endoscopy;  Laterality: N/A;  . COLONOSCOPY  10/18/2012   Procedure: COLONOSCOPY;  Surgeon: Lear Ng, MD;  Location: [facility] ENDOSCOPY;  Service: Endoscopy;  Laterality: N/A;  colonic dilation  . ESOPHAGOGASTRODUODENOSCOPY  12/01/2011   Procedure: ESOPHAGOGASTRODUODENOSCOPY (EGD);  Surgeon: Lear Ng, MD;  Location: [facility];  Service: Endoscopy;  Laterality: N/A;  . HOT HEMOSTASIS  12/01/2011   Procedure: HOT HEMOSTASIS (ARGON PLASMA COAGULATION/BICAP);  Surgeon: Lear Ng, MD;  Location: [facility]OS[facility];  Service: Endoscopy;  Laterality: N/A;  . 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 alendronate (FOSAMAX) 70 MG tablet Take 70 mg by mouth every Wednesday. Take with a full glass of water on an empty stomach.  . carvedilol (COREG) 3.125 MG tablet Take 1 tablet (3.125 mg total) by mouth 2 (two) times daily with a meal.  . Cholecalciferol (VITAMIN D3) 1.25 MG (50000 UT) CAPS Take 1 capsule by mouth once a week.   . Ensure (ENSURE) Take 237 mLs by mouth 2 (two) times daily between meals. Due to weight loss  . isosorbide mononitrate (IMDUR) 30 MG 24 hr tablet Take 30 mg by mouth daily.  Marland Kitchen levothyroxine (SYNTHROID, LEVOTHROID) 25 MCG tablet Take 25 mcg by mouth daily before breakfast.   . lisinopril (PRINIVIL,ZESTRIL) 2.5 MG tablet Take 2.5 mg by mouth daily.  . mirtazapine (REMERON) 15  MG tablet Take 7.5 mg by mouth at bedtime. 1/2 TAB  . Multiple Vitamins-Minerals (PRESERVISION AREDS 2 PO) Take 1 capsule by mouth 2 (two) times daily.   . pantoprazole sodium (PROTONIX) 40 mg/20 mL PACK Take 40 mg by mouth daily. Mix 1 packet on 1 teaspoonful of applesauce or 5 mL apple juice and take daily, DO NOT ADMINISTER WITH ANY OTHER LIQUIDS OR FOODS  . QUEtiapine (SEROQUEL XR) 400 MG 24 hr tablet Take 400 mg by mouth at bedtime. Take 1 tablet along with a 50 mg tablet to = 450 mg  . QUEtiapine (SEROQUEL) 100 MG tablet Take 150 mg by mouth daily. Take 1-1/2 tablets to = 150 mg  . QUEtiapine (SEROQUEL) 50 MG tablet Take 50 mg by mouth at bedtime. Take 1 tablet along with a 400 mg tablet to = 450 mg  . rivaroxaban (XARELTO) 10 MG TABS tablet Take 15 mg by mouth  daily. 1 and 1/2 tablets to = 15 mg  . sertraline (ZOLOFT) 25 MG tablet Take 25 mg by mouth daily. Take 1 tablet, along with Sertraline 50 mg to equal 75mg , by mouth daily  . sertraline (ZOLOFT) 50 MG tablet Take 50 mg by mouth. TAKE 1 AND 1/2 TABLETS (75MG ) BY MOUTH ONCE DAILY MOOD/DEPRESSION  . simvastatin (ZOCOR) 10 MG tablet Take 10 mg by mouth at bedtime.  . traZODone (DESYREL) 100 MG tablet Take 100 mg by mouth at bedtime.    No facility-administered encounter medications on file as of 07/16/2019.     Review of Systems  This is limited secondary to dementia.  General she is not complaining of any fever chills her weight appears to be fairly stable.  Skin not complaining of any rashes or itching.  Head ears eyes nose mouth and throat is not complaining of visual changes or sore throat.  Respiratory is not complaining of shortness of breath or cough.  Cardiac does not complain of chest pain her edema is quite minimal.  GI the she denies any abdominal pain nausea vomiting diarrhea constipation she does have an abdominal hernia chronic.  GU is not complaining of dysuria.  Musculoskeletal does not complain of joint pain.   Neurologic does not complain of feeling dizzy or having a headache or having numbness.  And psych she does not complain of being overtly depressed or anxious a little bit irritated with exam.    Immunization History  Administered Date(s) Administered  . Influenza-Unspecified 08/26/2016, 10/21/2017  . PPD Test 08/16/2016  . Pneumococcal Polysaccharide-23 01/08/2012   Pertinent  Health Maintenance Due  Topic Date Due  . INFLUENZA VACCINE  06/30/2019  . DEXA SCAN  11/30/2019 (Originally 08/15/2004)  . PNA vac Low Risk Adult (2 of 2 - PCV13) 11/30/2019 (Originally 01/07/2013)   Fall Risk  07/13/2018 07/07/2017  Falls in the past year? Yes No  Number falls in past yr: 1 -  Injury with Fall? No -   Functional Status Survey:    Vitals:   07/16/19 1025  BP: 114/68  Pulse: 63  Resp: 18  Temp: 97.6 F (36.4 C)  TempSrc: Oral  SpO2: 95%  Weight: 148 lb (67.1 kg)  Height: 5' (1.524 m)   Body mass index is 28.9 kg/m. Physical Exam   In general this is a fairly well-nourished elderly female in no distress lying comfortably in bed.  Her skin is warm and dry.  Eyes visual acuity appears to be grossly intact there and conjunctive are clear.  Oropharynx appears to be clear mucous membranes moist she is largely edentulous.  Chest is clear to auscultation with somewhat shallow air entry there is no labored breathing.  Heart is regular irregular without murmur gallop or rub I got a pulse in the mid to high 50s.  She has scant lower extremity edema.  Abdomen is soft nontender there is a mid abdominal hernia this is nontender bowel sounds are active.  Musculoskeletal Limited exam since he is in bed but is able to move all extremities x4  Neurologic is grossly intact her speech is clear could not really appreciate true lateralizing findings.  Psych she is oriented to self she is cooperative with exam but a little irritated with it   Labs reviewed: Recent Labs    12/11/18  12/21/18 03/05/19  NA 141 142 144  K 4.5 4.2 4.2  BUN 31* 19 22*  CREATININE 1.2* 1.0 1.2*   Recent Labs  09/06/18 12/11/18  AST 15 11*  ALT 11 6*  ALKPHOS 65 61   Recent Labs    09/06/18 12/11/18 12/21/18  WBC 6.8 4.8 5.0  HGB 13.7 12.2 11.7*  HCT 40 36 37  PLT 208 153 162   Lab Results  Component Value Date   TSH 2.50 12/11/2018   Lab Results  Component Value Date   HGBA1C 5.7 12/11/2018   Lab Results  Component Value Date   CHOL 130 12/11/2018   HDL 49 12/11/2018   LDLCALC 64 12/11/2018   TRIG 84 12/11/2018   CHOLHDL 1.9 06/19/2017    Significant Diagnostic Results in last 30 days:  No results found.  Assessment/Plan  #1 history of COVID-19 positive test again she has been asymptomatic she is no longer in isolation at this point continue to monitor.  2.  History of systolic CHF this appears to be compensated she is not on a diuretic she continues on low-dose Coreg 3.125 mg twice daily lisinopril 2.5 mg a day and Imdur 30 mg a day.  3.  History of hypertension this appears stable as noted above on low-dose Coreg 3.125 mg twice daily as well as lisinopril 2.5 mg a day and Imdur 30 mg a day.  4.  History of GERD with Barrett's esophagus she is on Protonix 40 mg daily.  5.  History of atrial fibrillation this appears rate controlled on low-dose Coreg as noted above she is also on Xarelto for anticoagulation- she does have pulses at times in the 50s but is asymptomatic and appears to be well-tolerated.  6.  History of hypothyroidism TSH was 2.5 on lab done in April will monitor periodically she is on Synthroid 25 mcg a day.  7.  History of dementia with mood disorder her weight appears to be stable nursing does not report any recent acute issues in this regard she is on Seroquel twice a day she is also on Zoloft 25 mg a day and Remeron 7.5 mg a day in addition to trazodone 100 mg nightly.  8.  History of hyperlipidemia she is on Zocor 10 mg a day her LDL was 64  in January will update this periodically.  9.  History of osteoporosis she continues on Fosamax as well as vitamin D.  10.  History of CVA she continues on aspirin she is also on Zocor 10 mg a day LDL was 64 back in January at this point continue supportive care she is also on lisinopril and Coreg to help with blood pressure control which appears to be stable.  Of note will update a hemoglobin A1c secondary to high risk med with [facility] will update a CBC with differential and metabolic panel for updated values again she is not on a diuretic her last hemoglobin A1c per chart review was 5.7 in January which is certainly satisfactory.  [id number] note greater than 35 minutes spent assessing patient-reviewing her chart and labs- and coordinating and formulating a plan of care for numerous diagnoses- of note greater than 50% of time spent coordinating a plan of care with input as noted above

## 2019-07-17 LAB — HEPATIC FUNCTION PANEL
ALT: 10 (ref 7–35)
AST: 14 (ref 13–35)
Alkaline Phosphatase: 60 (ref 25–125)
Bilirubin, Total: 0.2

## 2019-07-17 LAB — BASIC METABOLIC PANEL
BUN: 40 — AB (ref 4–21)
Creatinine: 1.5 — AB (ref 0.5–1.1)
Glucose: 82
Potassium: 5 (ref 3.4–5.3)
Sodium: 137 (ref 137–147)

## 2019-07-17 LAB — CBC AND DIFFERENTIAL
HCT: 33 — AB (ref 36–46)
Hemoglobin: 11.2 — AB (ref 12.0–16.0)
Neutrophils Absolute: 2
Platelets: 126 — AB (ref 150–399)
WBC: 4.2

## 2019-07-17 LAB — HEMOGLOBIN A1C: Hemoglobin A1C: 5.9

## 2019-07-18 ENCOUNTER — Non-Acute Institutional Stay (SKILLED_NURSING_FACILITY): Payer: Medicare Other | Admitting: Internal Medicine

## 2019-07-18 ENCOUNTER — Encounter: Payer: Self-pay | Admitting: Internal Medicine

## 2019-07-18 DIAGNOSIS — Z0000 Encounter for general adult medical examination without abnormal findings: Secondary | ICD-10-CM

## 2019-07-18 NOTE — Progress Notes (Signed)
Subjective:   Cindy [name] is a 78 y.o. female who presents for Medicare Annual (Subsequent) preventive examination.  Review of Systems:  This is somewhat limited secondary to dementia provided by nursing staff as well.  General no complaints of fever chills.  Skin does not complain of rashes or itching.  Head ears eyes nose mouth and throat is not complaining of visual changes or sore throat.  Respiratory does not complain of being short of breath or having cough.  Cardiac does not complain of chest pain.  GI is not complaining of abdominal discomfort nausea vomiting diarrhea constipation- per staff at times has somewhat of a spotty appetite.  GU does not complain of dysuria.  Musculoskeletal denies any joint pain at this time.  Neurologic does not complain of headache dizziness or syncope.  And psych does have a history of dementia with behaviors but this has been well controlled         Objective:     Vitals: BP 115/71   Pulse 66   Temp 98.4 F (36.9 C) (Oral)   Resp 18   Ht 5' (1.524 m)   Wt 148 lb (67.1 kg)   SpO2 95%   BMI 28.90 kg/m   Body mass index is 28.9 kg/m.   In general this is a pleasant elderly female in no distress.  Her skin is warm and dry.  Eyes visual acuity appears grossly intact sclera and conjunctive are clear.  Oropharynx is clear mucous membranes moist.  Chest is clear to auscultation with shallow air entry there is no labored breathing.  Heart is regular rate without murmur gallop or rub rate is in the 50s she has fairly minimal lower extremity edema.  Abdomen is soft nontender with positive bowel sounds she does have a mid abdominal hernia which is nontender.  Musculoskeletal Limited exam since she is in bed but is able to move all extremities x4  Neurologic is grossly intact her speech is clear.  Psych she is oriented to self she is pleasant cooperative- less irritated with exam than when I saw her previously      Advanced Directives 07/18/2019 07/16/2019 07/06/2019 07/05/2019 06/05/2019 05/02/2019 05/02/2019  Does Patient Have a Medical Advance Directive? Yes Yes Yes - Yes Yes Yes  Type of Advance Directive Out of facility DNR (pink MOST or yellow form) Out of facility DNR (pink MOST or yellow form) Out of facility DNR (pink MOST or yellow form) Out of facility DNR (pink MOST or yellow form) Out of facility DNR (pink MOST or yellow form) Out of facility DNR (pink MOST or yellow form) Out of facility DNR (pink MOST or yellow form)  Does patient want to make changes to medical advance directive? No - Patient declined No - Patient declined No - Patient declined No - Patient declined No - Patient declined No - Patient declined No - Patient declined  Copy of [place] in [place]  Would patient like information on creating a medical advance directive? - - - - - - -  Pre-existing out of facility DNR order (yellow form or pink MOST form) Yellow form placed in chart (order not valid for inpatient use) Yellow form placed in chart (order not valid for inpatient use) Yellow form placed in chart (order not valid for inpatient use) Yellow form placed in chart (order not valid for inpatient use) Yellow form placed in chart (order not valid for inpatient use) Yellow  form placed in chart (order not valid for inpatient use) Yellow form placed in chart (order not valid for inpatient use)    Tobacco Social History   Tobacco Use  Smoking Status Former Smoker  . Quit date: 04/09/2003  . Years since quitting: 16.2  Smokeless Tobacco Never Used     Counseling given: Not Answered   Clinical Intake:  Pre-visit preparation completed: Yes  Pain : No/denies pain     BMI - recorded: 28.9 Nutritional Risks: Other (Comment) Diabetes: No  How often do you need to have someone help you when you read instructions, pamphlets, or other written materials from your doctor or pharmacy?: 3 - Sometimes What is  the last grade level you completed in school?: 8 th grade  Interpreter Needed?: No  Comments: He does have some cognitive impairment dementia-which at times has required more extensive assistance at times.  Past Medical History:  Diagnosis Date  . A-fib ([place]) 01/07/2012  . ANEMIA 07/29/2010   Qualifier: Diagnosis of  By: Bobby Rumpf CMA (AAMA), Patty    . Angina pectoris ([place])   . Anxiety   . Asthma 01/07/2012  . Barrett's esophagus   . Bronchitis   . 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 Chest pain   . Coronary artery disease    right carotid occlusion   . Dementia with behavioral disturbance ([place]) 09/02/2016  . Depression   . Dysrhythmia    atrial fibrillation  . GERD (gastroesophageal reflux disease)   . 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 Hyperlipidemia   . Hypertension   . Mood disorder ([place])   . Paranoia ([place]) 10/03/2016  . Recurrent upper respiratory infection (URI)   . Stroke [facility])    2007, after left endarterectomy   Past Surgical History:  Procedure Laterality Date  . ABDOMINAL HYSTERECTOMY    . BALLOON DILATION  10/18/2012   Procedure: BALLOON DILATION;  Surgeon: Lear Ng, MD;  Location: [facility];  Service: Endoscopy;  Laterality: N/A;  . CARDIAC CATHETERIZATION    . CAROTID ENDARTERECTOMY     left, complicated by stroke  . CHOLECYSTECTOMY     incidental at time of colectomy  . COLONOSCOPY  12/01/2011   Procedure: COLONOSCOPY;  Surgeon: Lear Ng, MD;  [facility];  Service: Endoscopy;  Laterality: N/A;  . COLONOSCOPY  10/18/2012   Procedure: COLONOSCOPY;  Surgeon: Lear Ng, MD;  [facility];  Service: Endoscopy;  Laterality: N/A;  colonic dilation  . ESOPHAGOGASTRODUODENOSCOPY  12/01/2011   Procedure: ESOPHAGOGASTRODUODENOSCOPY (EGD);  Surgeon: Lear Ng, MD;  Location: [facility];  Service: Endoscopy;  Laterality: N/A;  . HOT HEMOSTASIS  12/01/2011   Procedure: HOT HEMOSTASIS (ARGON PLASMA  COAGULATION/BICAP);  Surgeon: Lear Ng, MD;  Location: [facility];  Service: Endoscopy;  Laterality: N/A;  . 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 Heart disease Mother   . Cancer Mother   . Heart attack Father   . Cancer Sister        ovarian   Social History   Socioeconomic History  . Marital status: Divorced    Spouse name: Not on file  . Number of children: Not on file  . Years of education: Not on file  . Highest education level: Not on file  Occupational History  . Occupation: retired Tour manager  . Financial  resource strain: Not hard at all  . Food insecurity    Worry: Never true    Inability: Never true  . Transportation needs    Medical: No    Non-medical: No  Tobacco Use  . Smoking status: Former Smoker    Quit date: 04/09/2003    Years since quitting: 16.2  . Smokeless tobacco: Never Used  Substance and Sexual Activity  . Alcohol use: No  . Drug use: No  . Sexual activity: Never  Lifestyle  . Physical activity    Days per week: 0 days    Minutes per session: 0 min  . Stress: Not at all  Relationships  . Social Herbalist on phone: Once a week    Gets together: Once a week    Attends religious service: Never    Active member of club or organization: No    Attends meetings of clubs or organizations: Never    Relationship status: Divorced  Other Topics Concern  . Not on file  Social History Narrative   Admitted to [facility] 08/16/16   Divorced   Former smoker-stopped 2004   Alcohol none   DNR    Outpatient Encounter Medications as of 07/18/2019  Medication Sig  . alendronate (FOSAMAX) 70 MG tablet Take 70 mg by mouth every Wednesday. Take with a full glass of water on an empty stomach.  . carvedilol (COREG) 3.125 MG tablet Take 1 tablet (3.125 mg total) by mouth 2 (two) times daily with a meal.  . Cholecalciferol  (VITAMIN D3) 1.25 MG (50000 UT) CAPS Take 1 capsule by mouth once a week.   . Ensure (ENSURE) Take 237 mLs by mouth 2 (two) times daily between meals. Due to weight loss  . isosorbide mononitrate (IMDUR) 30 MG 24 hr tablet Take 30 mg by mouth daily.  Marland Kitchen levothyroxine (SYNTHROID, LEVOTHROID) 25 MCG tablet Take 25 mcg by mouth daily before breakfast.   . lisinopril (PRINIVIL,ZESTRIL) 2.5 MG tablet Take 2.5 mg by mouth daily.  . mirtazapine (REMERON) 15 MG tablet Take 7.5 mg by mouth at bedtime. 1/2 TAB  . Multiple Vitamins-Minerals (PRESERVISION AREDS 2 PO) Take 1 capsule by mouth 2 (two) times daily.   . pantoprazole sodium (PROTONIX) 40 mg/20 mL PACK Take 40 mg by mouth daily. Mix 1 packet on 1 teaspoonful of applesauce or 5 mL apple juice and take daily, DO NOT ADMINISTER WITH ANY OTHER LIQUIDS OR FOODS  . QUEtiapine (SEROQUEL XR) 400 MG 24 hr tablet Take 400 mg by mouth at bedtime. Take 1 tablet along with a 50 mg tablet to = 450 mg  . QUEtiapine (SEROQUEL) 100 MG tablet Take 150 mg by mouth daily. Take 1-1/2 tablets to = 150 mg  . QUEtiapine (SEROQUEL) 50 MG tablet Take 50 mg by mouth at bedtime. Take 1 tablet along with a 400 mg tablet to = 450 mg  . rivaroxaban (XARELTO) 10 MG TABS tablet Take 15 mg by mouth daily. 1 and 1/2 tablets to = 15 mg  . sertraline (ZOLOFT) 25 MG tablet Take 25 mg by mouth daily. Take 1 tablet, along with Sertraline 50 mg to equal 75mg , by mouth daily  . sertraline (ZOLOFT) 50 MG tablet Take 50 mg by mouth. TAKE 1 AND 1/2 TABLETS (75MG ) BY MOUTH ONCE DAILY MOOD/DEPRESSION  . simvastatin (ZOCOR) 10 MG tablet Take 10 mg by mouth at bedtime.  . traZODone (DESYREL) 100 MG tablet Take 100 mg by mouth at bedtime.  No facility-administered encounter medications on file as of 07/18/2019.     Activities of Daily Living No flowsheet data found.  Patient Care Team: [name], MD as PCP - General (Internal Medicine)    Assessment:   This is a routine wellness  examination for [place].  Exercise Activities and Dietary recommendations    Goals   None   Goal is to maintain present lifestyle as much as possible--- I suspect with dementia progressing she will need more extensive care and assisted this time goes on.  Also emphasis will  Also  have to be on keeping her comfortable and pain-free as much as possible.  She also have somewhat of a spotty appetite so her goal will be to try to maintain her weight as much as possible as well  Fall Risk Fall Risk  07/13/2018 07/07/2017  Falls in the past year? Yes No  Number falls in past yr: 1 -  Injury with Fall? No -   Is the patient's home free of loose throw rugs in walkways, pet beds, electrical cords, etc?   yes      Grab bars in the bathroom? yes      Handrails on the stairs?   does not apply      Adequate lighting?   Yes  Timed Get Up and Go performed: Not performed  Depression Screen PHQ 2/9 Scores 07/13/2018 07/07/2017  PHQ - 2 Score 0 4  PHQ- 9 Score - 7  On mood test she scored 3 out of 27 with lower numbers desired  Cognitive Function  Most recently scored 8 out of 15 on the the BIMS  test on June 11, 2019   6CIT Screen 07/13/2018 07/07/2017  What Year? 0 points 0 points  What month? 3 points 0 points  What time? 3 points 3 points  Count back from 20 4 points 0 points  Months in reverse 0 points 4 points  Repeat phrase 10 points 10 points  Total Score 20 17    Immunization History  Administered Date(s) Administered  . Influenza-Unspecified 08/26/2016, 10/21/2017  . PPD Test 08/16/2016  . Pneumococcal Conjugate-13 07/15/2018  . Pneumococcal Polysaccharide-23 01/08/2012    Qualifies for Shingles Vaccine?  Screening Tests Health Maintenance  Topic Date Due  . INFLUENZA VACCINE  06/30/2019  . DEXA SCAN  11/30/2019 (Originally 08/15/2004)  . TETANUS/TDAP  10/28/2020 (Originally 08/15/1960)  . PNA vac Low Risk Adult  Completed    Cancer Screenings: Lung: Low Dose CT Chest  recommended if Age 67-80 years, 30 pack-year currently smoking OR have quit w/in 15years. Patient does not qualify. Breast:  Up to date on Mammogram?  Patient aged out Up to date of Bone Density/Dexa? Yes         Plan:    At ths point emphasis will be maintained her current lifestyle -- to do as much as she can on her own as possible although I suspect this will continue to be a challenge at times.    I have personally reviewed and noted the following in the patient's chart:   . Medical and social history . Use of alcohol, tobacco or illicit drugs  . Current medications and supplements . Functional ability and status . Nutritional status . Physical activity . Advanced directives . List of other physicians . Hospitalizations, surgeries, and ER visits in previous 12 months . Vitals . Screenings to include cognitive, depression, and falls . Referrals and appointments  In addition, I have reviewed and  discussed with patient certain preventive protocols, quality metrics, and best practice recommendations. A written personalized care plan for preventive services as well as general preventive health recommendations were provided to patient.     Granville Lewis, PA-C  07/18/2019

## 2019-07-23 ENCOUNTER — Non-Acute Institutional Stay (SKILLED_NURSING_FACILITY): Payer: Medicare Other | Admitting: Internal Medicine

## 2019-07-23 DIAGNOSIS — N179 Acute kidney failure, unspecified: Secondary | ICD-10-CM

## 2019-07-23 LAB — BASIC METABOLIC PANEL WITH GFR
BUN: 40 — AB (ref 4–21)
Creatinine: 1.6 — AB (ref 0.5–1.1)
Glucose: 90
Potassium: 4.3 (ref 3.4–5.3)
Sodium: 142 (ref 137–147)

## 2019-07-27 ENCOUNTER — Non-Acute Institutional Stay (SKILLED_NURSING_FACILITY): Payer: Medicare Other | Admitting: Internal Medicine

## 2019-07-27 DIAGNOSIS — F0281 Dementia in other diseases classified elsewhere with behavioral disturbance: Secondary | ICD-10-CM | POA: Diagnosis not present

## 2019-07-27 DIAGNOSIS — G301 Alzheimer's disease with late onset: Secondary | ICD-10-CM | POA: Diagnosis not present

## 2019-07-27 DIAGNOSIS — N289 Disorder of kidney and ureter, unspecified: Secondary | ICD-10-CM | POA: Diagnosis not present

## 2019-07-27 DIAGNOSIS — I10 Essential (primary) hypertension: Secondary | ICD-10-CM

## 2019-07-27 DIAGNOSIS — F02818 Dementia in other diseases classified elsewhere, unspecified severity, with other behavioral disturbance: Secondary | ICD-10-CM

## 2019-07-29 ENCOUNTER — Encounter: Payer: Self-pay | Admitting: Internal Medicine

## 2019-07-29 NOTE — Progress Notes (Signed)
This is an acute visit.  Level of care skilled.  [facility]  Chief complaint acute visit- secondary to some increased confusion-paranoia.  History of present illness.  Patient is a 78 year old female who is a long-term resident of the facility she has a history of systolic CHF as well as hypertension GERD atrial fibrillation CVA hypothyroidism and dementia with behaviors.  She also recently tested positive for COVID-19 was in isolation but remained essentially asymptomatic and has returned to her room.  Patient does have some history apparently of paranoid behaviors and confusion and apparently today she has appeared somewhat more confused and making somewhat irrational statements.  When I saw her she did mention something about recently being pregnant.  Per nursing this is not totally unusual that she does have these periods.  She is followed by psychiatric services and continues on Seroquel as well as Zoloft and Remeron I believe the Remeron is more for appetite stimulation she also has trazodone at night.  Her vital signs are stable she has no complaints today nursing has obtained a urinalysis and culture results are pending.  Past Medical History:  Diagnosis Date  . A-fib ([place]) 01/07/2012  . ANEMIA 07/29/2010   Qualifier: Diagnosis of  By: Bobby Rumpf CMA (AAMA), Patty    . Angina pectoris ([place])   . Anxiety   . Asthma 01/07/2012  . Barrett's esophagus   . Bronchitis   . 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 Chest pain   . Coronary artery disease    right carotid occlusion   . Dementia with behavioral disturbance ([place]) 09/02/2016  . Depression   . Dysrhythmia    atrial fibrillation  . GERD (gastroesophageal reflux disease)   . 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 Hyperlipidemia   . Hypertension   . Mood disorder ([place])   . Paranoia ([place]) 10/03/2016  . Recurrent upper respiratory infection (URI)   . Stroke [facility])    2007, after left  endarterectomy        Past Surgical History:  Procedure Laterality Date  . ABDOMINAL HYSTERECTOMY    . BALLOON DILATION  10/18/2012   Procedure: BALLOON DILATION;  Surgeon: Lear Ng, MD;  [facility];  Service: Endoscopy;  Laterality: N/A;  . CARDIAC CATHETERIZATION    . CAROTID ENDARTERECTOMY     left, complicated by stroke  . CHOLECYSTECTOMY     incidental at time of colectomy  . COLONOSCOPY  12/01/2011   Procedure: COLONOSCOPY;  Surgeon: Lear Ng, MD;  [facility];  Service: Endoscopy;  Laterality: N/A;  . COLONOSCOPY  10/18/2012   Procedure: COLONOSCOPY;  Surgeon: Lear Ng, MD;  [facility];  Service: Endoscopy;  Laterality: N/A;  colonic dilation  . ESOPHAGOGASTRODUODENOSCOPY  12/01/2011   Procedure: ESOPHAGOGASTRODUODENOSCOPY (EGD);  Surgeon: Lear Ng, MD;  Location: [facility];  Service: Endoscopy;  Laterality: N/A;  . HOT HEMOSTASIS  12/01/2011   Procedure: HOT HEMOSTASIS (ARGON PLASMA COAGULATION/BICAP);  Surgeon: Lear Ng, MD;  Location: [facility];  Service: Endoscopy;  Laterality: N/A;  . 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 alendronate (FOSAMAX) 70 MG tablet Take 70 mg by mouth every Wednesday. Take with a full glass of water on an empty stomach.  . carvedilol (COREG) 3.125 MG tablet Take 1 tablet (3.125 mg total)  by mouth 2 (two) times daily with a meal.  . Cholecalciferol (VITAMIN D3) 1.25 MG (50000 UT) CAPS Take 1 capsule by mouth once a week.   . Ensure (ENSURE) Take 237 mLs by mouth 2 (two) times daily between meals. Due to weight loss  . isosorbide mononitrate (IMDUR) 30 MG 24 hr tablet Take 30 mg by mouth daily.  Marland Kitchen levothyroxine (SYNTHROID, LEVOTHROID) 25 MCG tablet Take 25 mcg by mouth daily before breakfast.   . lisinopril (PRINIVIL,ZESTRIL) 2.5 MG  tablet Take 2.5 mg by mouth daily.  . mirtazapine (REMERON) 15 MG tablet Take 7.5 mg by mouth at bedtime. 1/2 TAB  . Multiple Vitamins-Minerals (PRESERVISION AREDS 2 PO) Take 1 capsule by mouth 2 (two) times daily.   . pantoprazole sodium (PROTONIX) 40 mg/20 mL PACK Take 40 mg by mouth daily. Mix 1 packet on 1 teaspoonful of applesauce or 5 mL apple juice and take daily, DO NOT ADMINISTER WITH ANY OTHER LIQUIDS OR FOODS  . QUEtiapine (SEROQUEL XR) 400 MG 24 hr tablet Take 400 mg by mouth at bedtime. Take 1 tablet along with a 50 mg tablet to = 450 mg  . QUEtiapine (SEROQUEL) 100 MG tablet Take 150 mg by mouth daily. Take 1-1/2 tablets to = 150 mg  . QUEtiapine (SEROQUEL) 50 MG tablet Take 50 mg by mouth at bedtime. Take 1 tablet along with a 400 mg tablet to = 450 mg  . rivaroxaban (XARELTO) 10 MG TABS tablet Take 15 mg by mouth daily. 1 and 1/2 tablets to = 15 mg  . sertraline (ZOLOFT) 25 MG tablet Take 25 mg by mouth daily. Take 1 tablet, along with Sertraline 50 mg to equal 75mg , by mouth daily  . sertraline (ZOLOFT) 50 MG tablet Take 50 mg by mouth. TAKE 1 AND 1/2 TABLETS (75MG ) BY MOUTH ONCE DAILY MOOD/DEPRESSION  . simvastatin (ZOCOR) 10 MG tablet Take 10 mg by mouth at bedtime.  . traZODone (DESYREL) 100 MG tablet Take 100 mg by mouth at bedtime.    No facility-administered encounter medications on file as of 07/16/2019.     Review of Systems   This is limited secondary to dementia provided by nursing as well.  General is not complaining of fever chills.  Skin does not complain of rashes itching diaphoresis.  Head ears eyes nose mouth and throat does not complain of visual changes or sore throat.  Respiratory does not complain of being short of breath or having a cough.  Cardiac no complaints of chest pain has fairly minimal edema.  GI does not complain of abdominal discomfort nausea vomiting diarrhea constipation.  GU is not really complaining of dysuria.  Musculoskeletal  does not complain of joint pain.  Neurologic is not complaining of having a headache or dizziness or syncope.  Psych as noted above she does at times have increased confusion.  Physical exam.  Temperature is 97.5 pulse is 60 respiration 17 blood pressure 158/72.  In general this is a well-nourished elderly female in no distress sitting comfortably in her wheelchair.  Her skin is warm and dry.  Eyes visual acuity appears to be intact sclera and conjunctive are clear.  Oropharynx is clear mucous membranes moist.  Chest is clear to auscultation there is no labored breathing her effort is somewhat poor.  Heart is regular rate and rhythm with some occasional irregular beats she has trace lower extremity edema.  Abdomen is somewhat obese soft nontender with positive bowel sounds.  Musculoskeletal moves her extremities x4  it appears at baseline.  Neurologic is grossly intact without lateralizing findings her speech is clear.  Psych she is oriented to self she is pleasant  will follow simple verbal commands-she at times will make confusing statement as noted above stating that recently she was pregnant-  Labs.  July 27, 2019.  Sodium 142 potassium 4.3 BUN 40.2 creatinine 1.57.  July 17, 2019.  Sodium 137 potassium 5 BUN 40.2 creatinine 1.51.  Liver function tests within normal limits.  WBC 4.2 hemoglobin 11.2 platelets 126.  Hemoglobin A1c was 5.9.  Jerry 13 2020.  TSH was 2.50.  Assessment and plan.  1.  Confusion--with history of dementia- with some paranoid behavior-per nursing this is not totally new-a urinalysis and culture is pending will await those results also I do note she does have a history of hypothyroidism she is on Synthroid will update a TSH next week for updated values clinically she appears to be stable.--At this point continue Seroquel she gets 150 mg earlier in the day and actually 450 mg at night- she also is on Zoloft 75 mg a day and continues on  low-dose Remeron 7.5 mg a day-  2.  History of renal insufficiency-recent creatinine is had risen slightly to 1.5 area will have this updated next week to see where we stand --this may be her new baseline previous creatinines have been more in the low ones- continue to encourage fluids.--Her low-dose lisinopril has been discontinued  3.  Hypertension her lisinopril was recently discontinued because her creatinine has gone up somewhat she is not on a diuretic-blood pressures appear to be somewhat variable recently in the high 120s today was 158  this is a little higher than her norm at this point will continue to monitor baseline systolically recently appears to be around 140--she is on Imdur 30 mg a day as well as Coreg 3.125 mg twice daily.-if -  Consistently elevated will consider adding another agent   [id number]        .

## 2019-07-30 ENCOUNTER — Non-Acute Institutional Stay (SKILLED_NURSING_FACILITY): Payer: Medicare Other | Admitting: Internal Medicine

## 2019-07-30 DIAGNOSIS — F22 Delusional disorders: Secondary | ICD-10-CM

## 2019-07-30 DIAGNOSIS — F29 Unspecified psychosis not due to a substance or known physiological condition: Secondary | ICD-10-CM

## 2019-07-30 NOTE — Progress Notes (Signed)
Location:  [facility] Room Number: 305-W Place of Service:  SNF (31)  [name], MD  Patient Care Team: [name], MD as PCP - General (Internal Medicine)  Extended Emergency Contact Information Primary Emergency Contact: Tyner,[name] Address: [address] [place], [place] [address] [name] Work Phone: [phone] Mobile Phone: [phone] Relation: Daughter Secondary Emergency Contact: Crysta, Desmidt Mobile Phone: [phone] Relation: Son    Allergies: Patient has no known allergies.  Chief Complaint  Patient presents with  . Acute Visit    Patient is seen for paranoid delusions.    HPI: Patient is 78 y.o. female who nursing asked me to see because she is having delusions.  This is happened prior and patient has needed to be admitted to a psychiatric ward.  Patient most certainly is having paranoid delusions, discussing unborn babiesmen with guns discussing someone trying to harm someone else discussing other things which could not possibly be happening, all while she is unclothed from the waist down.  She believes she is being poisoned by the staff here.  It was reported that she has been refusing her medications which only makes the situation worse.  Patient did let me into her room to speak to her, but has kept others out.  This is been oncoming for several days culminating in last night in which she did not sleep at all and today where she is frankly paranoid and frankly delusional.  Past Medical History:  Diagnosis Date  . A-fib ([place]) 01/07/2012  . ANEMIA 07/29/2010   Qualifier: Diagnosis of  By: Bobby Rumpf CMA (AAMA), Patty    . Angina pectoris ([place])   . Anxiety   . Asthma 01/07/2012  . Barrett's esophagus   . Bronchitis   . 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 Chest pain   . Coronary artery disease    right carotid occlusion   . Dementia with behavioral disturbance ([place]) 09/02/2016  . Depression   . Dysrhythmia     atrial fibrillation  . GERD (gastroesophageal reflux disease)   . 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 Hyperlipidemia   . Hypertension   . Mood disorder ([place])   . Paranoia ([place]) 10/03/2016  . Recurrent upper respiratory infection (URI)   . Stroke [facility])    2007, after left endarterectomy    Past Surgical History:  Procedure Laterality Date  . ABDOMINAL HYSTERECTOMY    . BALLOON DILATION  10/18/2012   Procedure: BALLOON DILATION;  Surgeon: Lear Ng, MD;  [facility];  Service: Endoscopy;  Laterality: N/A;  . CARDIAC CATHETERIZATION    . CAROTID ENDARTERECTOMY     left, complicated by stroke  . CHOLECYSTECTOMY     incidental at time of colectomy  . COLONOSCOPY  12/01/2011   Procedure: COLONOSCOPY;  Surgeon: Lear Ng, MD;  [facility];  Service: Endoscopy;  Laterality: N/A;  . COLONOSCOPY  10/18/2012   Procedure: COLONOSCOPY;  Surgeon: Lear Ng, MD;  [facility];  Service: Endoscopy;  Laterality: N/A;  colonic dilation  . ESOPHAGOGASTRODUODENOSCOPY  12/01/2011   Procedure: ESOPHAGOGASTRODUODENOSCOPY (EGD);  Surgeon: Lear Ng, MD;  Location: [facility];  Service: Endoscopy;  Laterality: N/A;  . HOT HEMOSTASIS  12/01/2011   Procedure: HOT HEMOSTASIS (ARGON PLASMA COAGULATION/BICAP);  Surgeon: Lear Ng, MD;  Location: [facility];  Service: Endoscopy;  Laterality: N/A;  . 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 If you have any questions, ask your nurse or doctor.        alendronate 70 MG tablet Commonly known as: FOSAMAX Take 70 mg by mouth every Wednesday. Take with a full glass of water on an empty stomach.   carvedilol 3.125 MG tablet Commonly known as: COREG Take 1 tablet (3.125 mg total) by mouth 2 (two) times  daily with a meal.   Ensure Take 237 mLs by mouth 2 (two) times daily between meals. Due to weight loss   isosorbide mononitrate 30 MG 24 hr tablet Commonly known as: IMDUR Take 30 mg by mouth daily.   levothyroxine 25 MCG tablet Commonly known as: SYNTHROID Take 25 mcg by mouth daily before breakfast.   lisinopril 2.5 MG tablet Commonly known as: ZESTRIL Take 2.5 mg by mouth daily.   mirtazapine 15 MG tablet Commonly known as: REMERON Take 7.5 mg by mouth at bedtime. 1/2 TAB   PRESERVISION AREDS 2 PO Take 1 capsule by mouth 2 (two) times daily.   Protonix 40 mg/20 mL Pack Generic drug: pantoprazole sodium Take 40 mg by mouth daily. Mix 1 packet on 1 teaspoonful of applesauce or 5 mL apple juice and take daily, DO NOT ADMINISTER WITH ANY OTHER LIQUIDS OR FOODS   QUEtiapine 100 MG tablet Commonly known as: SEROQUEL Take 150 mg by mouth daily. Take 1-1/2 tablets to = 150 mg   QUEtiapine 400 MG 24 hr tablet Commonly known as: SEROQUEL XR Take 400 mg by mouth at bedtime. Take 1 tablet along with a 50 mg tablet to = 450 mg   QUEtiapine 50 MG tablet Commonly known as: SEROQUEL Take 50 mg by mouth at bedtime. Take 1 tablet along with a 400 mg tablet to = 450 mg   rivaroxaban 10 MG Tabs tablet Commonly known as: XARELTO Take 15 mg by mouth daily. 1 and 1/2 tablets to = 15 mg   sertraline 50 MG tablet Commonly known as: ZOLOFT Take 75 mg by mouth daily. TAKE 1 AND 1/2 TABLETS (75MG ) BY MOUTH ONCE DAILY MOOD/DEPRESSION   sertraline 25 MG tablet Commonly known as: ZOLOFT Take 25 mg by mouth daily. Take 1 tablet, along with Sertraline 50 mg to equal 75mg , by mouth daily   simvastatin 10 MG tablet Commonly known as: ZOCOR Take 10 mg by mouth at bedtime.   traZODone 100 MG tablet Commonly known as: DESYREL Take 100 mg by mouth at bedtime.   Vitamin D3 1.25 MG (50000 UT) Caps Take 1 capsule by mouth once a week.       No orders of the defined types were placed in  this encounter.   Immunization History  Administered Date(s) Administered  . Influenza-Unspecified 08/26/2016, 10/21/2017  . PPD Test 08/16/2016  . Pneumococcal Conjugate-13 07/15/2018  . Pneumococcal Polysaccharide-23 01/08/2012    Social History   Tobacco Use  . Smoking status: Former Smoker    Quit date: 04/09/2003    Years since quitting: 16.3  . Smokeless tobacco: Never Used  Substance Use Topics  . Alcohol use: No    Review of Systems    unable to obtain secondary to delusions; nursing- concern for delusions as well as concern for not taking medication  Vitals:   07/31/19 1506  BP: (!) 143/79  Pulse: 80  Resp: 18  Temp: 98.6 F (37 C)  SpO2: 95%   Body mass index is 28.9 kg/m. Physical Exam  GENERAL APPEARANCE: Alert, conversant, No acute distress  SKIN: No diaphoresis rash HEENT: Unremarkable RESPIRATORY: Breathing is even, unlabored. Lung sounds are clear   CARDIOVASCULAR: Heart RRR no murmurs, rubs or gallops. No peripheral edema  GASTROINTESTINAL: Abdomen is soft, non-tender, not distended w/ normal bowel sounds.  GENITOURINARY: Bladder non tender, not distended  MUSCULOSKELETAL: No abnormal joints or musculature NEUROLOGIC: Cranial nerves 2-12 grossly intact. Moves all extremities PSYCHIATRIC: Pilar Plate paranoia, frank delusions  Patient Active Problem List   Diagnosis Date Noted  . Real time reverse transcriptase PCR positive for COVID-19 virus 07/09/2019  . Infection requiring airborne isolation precautions 07/09/2019  . 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 Macular degeneration 12/09/2018  . Psychoses ([place]) 12/19/2017  . Insomnia 12/19/2017  . Urinary tract infection due to extended-spectrum beta lactamase (ESBL) producing Escherichia coli 11/17/2017  . Dysphagia, oropharyngeal 11/15/2017  . Altered mental status 11/10/2017  . Acute metabolic encephalopathy [id number]  . Mania ([place]) 06/21/2017  . Hypomagnesemia 06/19/2017  . Essential  hypertension 06/18/2017  . Acute encephalopathy 06/17/2017  . Acute lower UTI 06/17/2017  . E. coli UTI 06/17/2017  . Depression 04/24/2017  . Hypothyroidism 10/03/2016  . Paranoia ([place]) 10/03/2016  . LOC (loss of consciousness) ([place]) 09/02/2016  . 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 Dementia with behavioral disturbance ([place]) 09/02/2016  . Asthma with acute exacerbation 08/21/2016  . Atrial fibrillation with RVR ([place]) 08/21/2016  . Elevated INR 08/21/2016  . Chronic systolic heart failure ([place]) 08/21/2016  . Hyperlipidemia 08/21/2016  . Electrolyte abnormality 08/21/2016  . HCAP (healthcare-associated pneumonia) 08/10/2016  . Sepsis ([place]) 08/09/2016  . Coronary atherosclerosis of native coronary artery 01/21/2014  . Obesity 01/17/2014  . Abdominal pain, generalized 10/18/2012  . GERD (gastroesophageal reflux disease)   . Mood disorder ([place])   . Anxiety   . 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[place])   . Stroke ([place])   . Chest pain 01/07/2012  . Asthma 01/07/2012  . A-fib ([place]) 01/07/2012  . 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 Anemia 01/07/2012  . ANEMIA 07/29/2010  . BARRETTS ESOPHAGUS 06/04/2010    CMP     Component Value Date/Time   NA 137 07/17/2019   K 5.0 07/17/2019   CL 103 04/29/2018 0938   CO2 29 04/29/2018 0938   GLUCOSE 102 (H) 04/29/2018 0938   BUN 40 (A) 07/17/2019   CREATININE 1.5 (A) 07/17/2019   CREATININE 1.00 04/29/2018 0938   CALCIUM 9.6 04/29/2018 0938   PROT 6.9 11/08/2017 1512   ALBUMIN 3.8 11/08/2017 1512   AST 14 07/17/2019   ALT 10 07/17/2019   ALKPHOS 60 07/17/2019   BILITOT 0.9 11/08/2017 1512   GFRNONAA 53 (L) 04/29/2018 0938   GFRAA >60 04/29/2018 0938   Recent Labs    12/21/18 03/05/19 07/17/19  NA 142 144 137  K 4.2 4.2 5.0  BUN 19 22* 40*  CREATININE 1.0 1.2* 1.5*   Recent Labs    09/06/18 12/11/18 07/17/19  AST 15 11* 14  ALT 11 6* 10  ALKPHOS 65 61 60   Recent Labs     12/11/18 12/21/18 07/17/19  WBC 4.8 5.0 4.2  NEUTROABS  --   --  2  HGB 12.2 11.7* 11.2*  HCT 36 37 33*  PLT  153 162 126*   Recent Labs    12/11/18  CHOL 130  LDLCALC 64  TRIG 84   No results found for: [facility] Lab Results  Component Value Date   TSH 2.50 12/11/2018   Lab Results  Component Value Date   HGBA1C 5.9 07/17/2019   Lab Results  Component Value Date   CHOL 130 12/11/2018   HDL 49 12/11/2018   LDLCALC 64 12/11/2018   TRIG 84 12/11/2018   CHOLHDL 1.9 06/19/2017    Significant Diagnostic Results in last 30 days:  No results found.  Assessment and Plan  Paranoia/delusional- labs and urine were done which were negative.  These were done so the patient could not be said to have a UTI and sent back.  Patient was admitted to [facility] her last episode and she has remained stable for a while since then; it is hoped that they will not just sent her back but will actually do an assessment and send her to a facility; since it is late in the day we will send her tomorrow morning to the ED.    [name], MD

## 2019-07-31 ENCOUNTER — Emergency Department (HOSPITAL_COMMUNITY)
Admission: EM | Admit: 2019-07-31 | Discharge: 2019-07-31 | Disposition: A | Discharge status: Home / Self Care | Payer: Medicare Other | Attending: Emergency Medicine | Admitting: Emergency Medicine

## 2019-07-31 ENCOUNTER — Encounter (HOSPITAL_COMMUNITY): Payer: Self-pay

## 2019-07-31 ENCOUNTER — Emergency Department (HOSPITAL_COMMUNITY): Payer: Medicare Other

## 2019-07-31 ENCOUNTER — Encounter: Payer: Self-pay | Admitting: Internal Medicine

## 2019-07-31 ENCOUNTER — Other Ambulatory Visit: Payer: Self-pay

## 2019-07-31 DIAGNOSIS — F0390 Unspecified dementia without behavioral disturbance: Secondary | ICD-10-CM | POA: Insufficient documentation

## 2019-07-31 DIAGNOSIS — I10 Essential (primary) hypertension: Secondary | ICD-10-CM | POA: Insufficient documentation

## 2019-07-31 DIAGNOSIS — I2510 Atherosclerotic heart disease of native coronary artery without angina pectoris: Secondary | ICD-10-CM | POA: Diagnosis not present

## 2019-07-31 DIAGNOSIS — Z87891 Personal history of nicotine dependence: Secondary | ICD-10-CM | POA: Insufficient documentation

## 2019-07-31 DIAGNOSIS — Z79899 Other long term (current) drug therapy: Secondary | ICD-10-CM | POA: Insufficient documentation

## 2019-07-31 DIAGNOSIS — J45909 Unspecified asthma, uncomplicated: Secondary | ICD-10-CM | POA: Diagnosis not present

## 2019-07-31 LAB — URINALYSIS, ROUTINE W REFLEX MICROSCOPIC
Bilirubin Urine: NEGATIVE
Glucose, UA: NEGATIVE mg/dL
Hgb urine dipstick: NEGATIVE
Ketones, ur: NEGATIVE mg/dL
Nitrite: NEGATIVE
Protein, ur: 30 mg/dL — AB
Specific Gravity, Urine: 1.008 (ref 1.005–1.030)
pH: 5 (ref 5.0–8.0)

## 2019-07-31 LAB — CBC WITH DIFFERENTIAL/PLATELET
Abs Immature Granulocytes: 0.02 10*3/uL (ref 0.00–0.07)
Basophils Absolute: 0 10*3/uL (ref 0.0–0.1)
Basophils Relative: 0 %
Eosinophils Absolute: 0.1 10*3/uL (ref 0.0–0.5)
Eosinophils Relative: 1 %
HCT: 39 % (ref 36.0–46.0)
Hemoglobin: 12.5 g/dL (ref 12.0–15.0)
Immature Granulocytes: 0 %
Lymphocytes Relative: 15 %
Lymphs Abs: 1 10*3/uL (ref 0.7–4.0)
MCH: 28.7 pg (ref 26.0–34.0)
MCHC: 32.1 g/dL (ref 30.0–36.0)
MCV: 89.7 fL (ref 80.0–100.0)
Monocytes Absolute: 0.5 10*3/uL (ref 0.1–1.0)
Monocytes Relative: 8 %
Neutro Abs: 5.4 10*3/uL (ref 1.7–7.7)
Neutrophils Relative %: 76 %
Platelets: 192 10*3/uL (ref 150–400)
RBC: 4.35 MIL/uL (ref 3.87–5.11)
RDW: 13.7 % (ref 11.5–15.5)
WBC: 7 10*3/uL (ref 4.0–10.5)
nRBC: 0 % (ref 0.0–0.2)

## 2019-07-31 LAB — ACETAMINOPHEN LEVEL: Acetaminophen (Tylenol), Serum: <10 ug/mL — ABNORMAL LOW (ref 10–30)

## 2019-07-31 LAB — COMPREHENSIVE METABOLIC PANEL
ALT: 18 U/L (ref 0–44)
AST: 23 U/L (ref 15–41)
Albumin: 4 g/dL (ref 3.5–5.0)
Alkaline Phosphatase: 66 U/L (ref 38–126)
Anion gap: 10 (ref 5–15)
BUN: 37 mg/dL — ABNORMAL HIGH (ref 8–23)
CO2: 25 mmol/L (ref 22–32)
Calcium: 9.5 mg/dL (ref 8.9–10.3)
Chloride: 99 mmol/L (ref 98–111)
Creatinine, Ser: 1.48 mg/dL — ABNORMAL HIGH (ref 0.44–1.00)
GFR calc Af Amer: 39 mL/min — ABNORMAL LOW (ref >60)
GFR calc non Af Amer: 33 mL/min — ABNORMAL LOW (ref >60)
Glucose, Bld: 107 mg/dL — ABNORMAL HIGH (ref 70–99)
Potassium: 4 mmol/L (ref 3.5–5.1)
Sodium: 134 mmol/L — ABNORMAL LOW (ref 135–145)
Total Bilirubin: 1.2 mg/dL (ref 0.3–1.2)
Total Protein: 7.2 g/dL (ref 6.5–8.1)

## 2019-07-31 LAB — ETHANOL: Alcohol, Ethyl (B): <10 mg/dL (ref <10)

## 2019-07-31 LAB — SALICYLATE LEVEL: Salicylate Lvl: <7 mg/dL (ref 2.8–30.0)

## 2019-07-31 NOTE — ED Notes (Signed)
[facility] called  

## 2019-07-31 NOTE — ED Provider Notes (Signed)
  Provider Note MRN:  [id number]  Arrival date & time: 07/31/19    ED Course and Medical Decision Making  Assumed care from Dr. Francia Greaves at shift change.  Pleasantly demented, would not take her medications at her facility today but then did right before EMS arrival.  No issues here, awaiting urinalysis and then anticipating discharge back to her facility.  Upon my evaluation patient is well-appearing, eating dinner.  No issues here in the emergency department, appropriate for discharge back to her facility.  Final Clinical Impressions(s) / ED Diagnoses     ICD-10-CM   1. Dementia without behavioral disturbance, unspecified dementia type [facility])  F03.90     ED Discharge Orders    None      Discharge Instructions   None     Barth Kirks. Sedonia Small, [place] [email or web address]    Maudie Flakes, MD 07/31/19 [phone]

## 2019-07-31 NOTE — ED Notes (Signed)
Pt dtr is at bedside.

## 2019-07-31 NOTE — Discharge Instructions (Addendum)
You were evaluated in the Emergency Department and after careful evaluation, we did not find any emergent condition requiring admission or further testing in the hospital. ° °Please return to the Emergency Department if you experience any worsening of your condition.  We encourage you to follow up with a primary care provider.  Thank you for allowing us to be a part of your care. °

## 2019-07-31 NOTE — ED Provider Notes (Signed)
[facility] DEPT Provider Note   CSN: [id number] Arrival date & time: 07/31/19  1314     History   Chief Complaint Chief Complaint  Patient presents with  . Hallucinations    HPI Cindy [name] is a 78 y.o. female.     78 year old female with prior medical history as detailed below presents for evaluation.  Patient is a resident at [facility].  Per report, patient has been resistant to taking her normal medications.  Today, however, she did take her normal dose of morning medications prior to transport to the ED.  She is calm and cooperative on exam.  She is alert.  She has dementia and appears to be at her baseline.   She is not aggressive or combative.   She is without specific acute complaint.   Level 5 caveat secondary to dementia.   The history is provided by the patient and medical records.  Illness Location:  "refusing medications" Severity:  Mild Onset quality:  Unable to specify Timing:  Sporadic Progression:  Improving Chronicity:  Recurrent   Past Medical History:  Diagnosis Date  . A-fib ([place]) 01/07/2012  . ANEMIA 07/29/2010   Qualifier: Diagnosis of  By: Bobby Rumpf CMA (AAMA), Patty    . Angina pectoris ([place])   . Anxiety   . Asthma 01/07/2012  . Barrett's esophagus   . Bronchitis   . 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 Chest pain   . Coronary artery disease    right carotid occlusion   . Dementia with behavioral disturbance ([place]) 09/02/2016  . Depression   . Dysrhythmia    atrial fibrillation  . GERD (gastroesophageal reflux disease)   . 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 Hyperlipidemia   . Hypertension   . Mood disorder ([place])   . Paranoia ([place]) 10/03/2016  . Recurrent upper respiratory infection (URI)   . Stroke [facility])    2007, after left endarterectomy    Patient Active Problem List   Diagnosis Date Noted  . Real time reverse transcriptase PCR positive for COVID-19 virus 07/09/2019  . Infection requiring  airborne isolation precautions 07/09/2019  . 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 Macular degeneration 12/09/2018  . Psychoses ([place]) 12/19/2017  . Insomnia 12/19/2017  . Urinary tract infection due to extended-spectrum beta lactamase (ESBL) producing Escherichia coli 11/17/2017  . Dysphagia, oropharyngeal 11/15/2017  . Altered mental status 11/10/2017  . Acute metabolic encephalopathy [id number]  . Mania ([place]) 06/21/2017  . Hypomagnesemia 06/19/2017  . Essential hypertension 06/18/2017  . Acute encephalopathy 06/17/2017  . Acute lower UTI 06/17/2017  . E. coli UTI 06/17/2017  . Depression 04/24/2017  . Hypothyroidism 10/03/2016  . Paranoia ([place]) 10/03/2016  . LOC (loss of consciousness) ([place]) 09/02/2016  . 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 Dementia with behavioral disturbance ([place]) 09/02/2016  . Asthma with acute exacerbation 08/21/2016  . Atrial fibrillation with RVR ([place]) 08/21/2016  . Elevated INR 08/21/2016  . Chronic systolic heart failure ([place]) 08/21/2016  . Hyperlipidemia 08/21/2016  . Electrolyte abnormality 08/21/2016  . HCAP (healthcare-associated pneumonia) 08/10/2016  . Sepsis ([place]) 08/09/2016  . Coronary atherosclerosis of native coronary artery 01/21/2014  . Obesity 01/17/2014  . Abdominal pain, generalized 10/18/2012  . GERD (gastroesophageal reflux disease)   . Mood disorder ([place])   . Anxiety   . 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[place])   . Stroke ([place])   . Chest pain 01/07/2012  . Asthma 01/07/2012  .  A-fib ([place]) 01/07/2012  . 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 Anemia 01/07/2012  . ANEMIA 07/29/2010  . BARRETTS ESOPHAGUS 06/04/2010    Past Surgical History:  Procedure Laterality Date  . ABDOMINAL HYSTERECTOMY    . BALLOON DILATION  10/18/2012   Procedure: BALLOON DILATION;  Surgeon: Lear Ng, MD;  [facility];  Service: Endoscopy;  Laterality: N/A;  . CARDIAC  CATHETERIZATION    . CAROTID ENDARTERECTOMY     left, complicated by stroke  . CHOLECYSTECTOMY     incidental at time of colectomy  . COLONOSCOPY  12/01/2011   Procedure: COLONOSCOPY;  Surgeon: Lear Ng, MD;  [facility];  Service: Endoscopy;  Laterality: N/A;  . COLONOSCOPY  10/18/2012   Procedure: COLONOSCOPY;  Surgeon: Lear Ng, MD;  [facility];  Service: Endoscopy;  Laterality: N/A;  colonic dilation  . ESOPHAGOGASTRODUODENOSCOPY  12/01/2011   Procedure: ESOPHAGOGASTRODUODENOSCOPY (EGD);  Surgeon: Lear Ng, MD;  Location: [facility];  Service: Endoscopy;  Laterality: N/A;  . HOT HEMOSTASIS  12/01/2011   Procedure: HOT HEMOSTASIS (ARGON PLASMA COAGULATION/BICAP);  Surgeon: Lear Ng, MD;  Location[facility];  Service: Endoscopy;  Laterality: N/A;  . 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      Home Medications    Prior to Admission medications   Medication Sig Start Date End Date Taking? Authorizing Provider  alendronate (FOSAMAX) 70 MG tablet Take 70 mg by mouth every Wednesday. Take with a full glass of water on an empty stomach.    [provider]  carvedilol (COREG) 3.125 MG tablet Take 1 tablet (3.125 mg total) by mouth 2 (two) times daily with a meal. 08/14/16   Johnson, Clanford L, MD  Cholecalciferol (VITAMIN D3) 1.25 MG (50000 UT) CAPS Take 1 capsule by mouth once a week.     [provider]  Ensure (ENSURE) Take 237 mLs by mouth 2 (two) times daily between meals. Due to weight loss    [provider]  isosorbide mononitrate (IMDUR) 30 MG 24 hr tablet Take 30 mg by mouth daily.    [provider]  levothyroxine (SYNTHROID, LEVOTHROID) 25 MCG tablet Take 25 mcg by mouth daily before breakfast.     [provider]  lisinopril (PRINIVIL,ZESTRIL) 2.5 MG tablet Take 2.5 mg by mouth  daily.    [provider]  mirtazapine (REMERON) 15 MG tablet Take 7.5 mg by mouth at bedtime. 1/2 TAB    [provider]  Multiple Vitamins-Minerals (PRESERVISION AREDS 2 PO) Take 1 capsule by mouth 2 (two) times daily.  09/16/18 08/11/19  [provider]  pantoprazole sodium (PROTONIX) 40 mg/20 mL PACK Take 40 mg by mouth daily. Mix 1 packet on 1 teaspoonful of applesauce or 5 mL apple juice and take daily, DO NOT ADMINISTER WITH ANY OTHER LIQUIDS OR FOODS    [provider]  QUEtiapine (SEROQUEL XR) 400 MG 24 hr tablet Take 400 mg by mouth at bedtime. Take 1 tablet along with a 50 mg tablet to = 450 mg    [provider]  QUEtiapine (SEROQUEL) 100 MG tablet Take 150 mg by mouth daily. Take 1-1/2 tablets to = 150 mg    [provider]  QUEtiapine (SEROQUEL) 50 MG tablet Take 50 mg by mouth at bedtime. Take 1 tablet along with a 400  mg tablet to = 450 mg    [provider]  rivaroxaban (XARELTO) 10 MG TABS tablet Take 15 mg by mouth daily. 1 and 1/2 tablets to = 15 mg    [provider]  sertraline (ZOLOFT) 25 MG tablet Take 25 mg by mouth daily. Take 1 tablet, along with Sertraline 50 mg to equal 75mg , by mouth daily    [provider]  sertraline (ZOLOFT) 50 MG tablet Take 50 mg by mouth. TAKE 1 AND 1/2 TABLETS (75MG ) BY MOUTH ONCE DAILY MOOD/DEPRESSION    [provider]  simvastatin (ZOCOR) 10 MG tablet Take 10 mg by mouth at bedtime.    [provider]  traZODone (DESYREL) 100 MG tablet Take 100 mg by mouth at bedtime.     [provider]    Family History Family History  Problem Relation Age of Onset  . Heart disease Mother   . Cancer Mother   . Heart attack Father   . Cancer Sister        ovarian    Social History Social History   Tobacco Use  . Smoking status: Former Smoker    Quit date: 04/09/2003    Years since quitting: 16.3  . Smokeless tobacco: Never Used  Substance  Use Topics  . Alcohol use: No  . Drug use: No     Allergies   Patient has no known allergies.   Review of Systems Review of Systems  All other systems reviewed and are negative.    Physical Exam Updated Vital Signs There were no vitals taken for this visit.  Physical Exam Vitals signs and nursing note reviewed.  Constitutional:      General: She is not in acute distress.    Appearance: Normal appearance. She is well-developed.  HENT:     Head: Normocephalic and atraumatic.  Eyes:     Conjunctiva/sclera: Conjunctivae normal.     Pupils: Pupils are equal, round, and reactive to light.  Neck:     Musculoskeletal: Normal range of motion and neck supple.  Cardiovascular:     Rate and Rhythm: Normal rate and regular rhythm.     Heart sounds: Normal heart sounds.  Pulmonary:     [place]: Pulmonary effort is normal. No respiratory distress.     Breath sounds: Normal breath sounds.  Abdominal:     General: There is no distension.     Palpations: Abdomen is soft.     Tenderness: There is no abdominal tenderness.  Musculoskeletal: Normal range of motion.        General: No deformity.  Skin:    General: Skin is warm and dry.  Neurological:     General: No focal deficit present.     Mental Status: She is alert. Mental status is at baseline.     Motor: No weakness.      ED Treatments / Results  Labs (all labs ordered are listed, but only abnormal results are displayed) Labs Reviewed  COMPREHENSIVE METABOLIC PANEL - Abnormal; Notable for the following components:      Result Value   Sodium 134 (*)    Glucose, Bld 107 (*)    BUN 37 (*)    Creatinine, Ser 1.48 (*)    GFR calc non Af Amer 33 (*)    GFR calc Af Amer 39 (*)    All other components within normal limits  ACETAMINOPHEN LEVEL - Abnormal; Notable for the following components:   Acetaminophen (Tylenol), Serum <10 (*)  All other components within normal limits  CBC WITH DIFFERENTIAL/PLATELET  ETHANOL   SALICYLATE LEVEL  URINALYSIS, ROUTINE W REFLEX MICROSCOPIC    EKG None  Radiology Dg Chest Port 1 View  Result Date: 07/31/2019 CLINICAL DATA:  Altered mental status. EXAM: PORTABLE CHEST 1 VIEW COMPARISON:  11/09/2017 FINDINGS: Remote proximal right humerus fracture. Midline trachea. Mild cardiomegaly. Atherosclerosis in the transverse aorta. No pleural effusion or pneumothorax. Clear lungs. No congestive failure. IMPRESSION: Mild cardiomegaly, without acute disease. Aortic Atherosclerosis (ICD10-I70.0). Electronically Signed   By: Abigail Miyamoto M.D.   On: 07/31/2019 15:14    Procedures Procedures (including critical care time)  Medications Ordered in ED Medications - No data to display   Initial Impression / Assessment and Plan / ED Course  I have reviewed the triage vital signs and the nursing notes.  Pertinent labs & imaging results that were available during my care of the patient were reviewed by me and considered in my medical decision making (see chart for details).         MDM  Screen complete  Cindy [name] was evaluated in Emergency Department on 07/31/2019 for the symptoms described in the history of present illness. She was evaluated in the context of the global COVID-19 pandemic, which necessitated consideration that the patient might be at risk for infection with the SARS-CoV-2 virus that causes COVID-19. Institutional protocols and algorithms that pertain to the evaluation of patients at risk for COVID-19 are in a state of rapid change based on information released by regulatory bodies including the CDC and federal and state organizations. These policies and algorithms were followed during the patient's care in the ED.  Patient presented for evaluation of dementia and reported resistance to taking her medications.   She appears to have taken her medications appropriately prior to arrival to the ED.   Screening labs thus far obtained do not demonstrate significant  abnormality.   Dr. Sedonia Small aware of pending UA and disposition.    Final Clinical Impressions(s) / ED Diagnoses   Final diagnoses:  Dementia without behavioral disturbance, unspecified dementia type [facility])    ED Discharge Orders    None       Valarie Merino, MD 07/31/19 1643

## 2019-07-31 NOTE — ED Notes (Signed)
Per EMS pt has not been exhibiting any aggressive behavior, and has been calm and cooperative during transport .

## 2019-07-31 NOTE — ED Triage Notes (Signed)
Pt arrived via [facility] from [facility], pt has been having hallucinations since Thursday. Per facility pt was not being compliant with medications however EMS does report that upon arrival pt did take her medications.  Pt has HX of Visual Hallucinations, dementia.

## 2019-08-01 ENCOUNTER — Encounter: Payer: Self-pay | Admitting: Internal Medicine

## 2019-08-01 ENCOUNTER — Non-Acute Institutional Stay (SKILLED_NURSING_FACILITY): Payer: Medicare Other | Admitting: Internal Medicine

## 2019-08-01 DIAGNOSIS — F0281 Dementia in other diseases classified elsewhere with behavioral disturbance: Secondary | ICD-10-CM | POA: Diagnosis not present

## 2019-08-01 DIAGNOSIS — G301 Alzheimer's disease with late onset: Secondary | ICD-10-CM | POA: Diagnosis not present

## 2019-08-01 DIAGNOSIS — N289 Disorder of kidney and ureter, unspecified: Secondary | ICD-10-CM

## 2019-08-01 LAB — URINE CULTURE

## 2019-08-01 NOTE — Progress Notes (Signed)
Location:  [facility] of Service:  SNF [phone]) Provider:  Henreitta Leber, MD  Patient Care Team: [name], MD as PCP - General (Internal Medicine)  Extended Emergency Contact Information Primary Emergency Contact: Tyner,[name] Address: [address] [place], [place] [address] [name] Work Phone: [phone] Mobile Phone: [phone] Relation: Daughter Secondary Emergency Contact: Uniquia, Glime Mobile Phone: [phone] Relation: Son   Goals of care: Advanced Directive information Advanced Directives 07/31/2019  Does Patient Have a Medical Advance Directive? Yes  Type of Advance Directive Out of facility DNR (pink MOST or yellow form)  Does patient want to make changes to medical advance directive? -  Copy of [place] in Chart? -  Would patient like information on creating a medical advance directive? -  Pre-existing out of facility DNR order (yellow form or pink MOST form) Yellow form placed in chart (order not valid for inpatient use)     Chief Complaint  Patient presents with  . Acute Visit    Followup of ER visit for behaviors    HPI:  Pt is a 78 y.o. female seen today for an acute visit for follow-up of an ER visit yesterday secondary behaviors.  Patient is a long-term resident of the facility with a history of systolic CHF as well as hypertension GERD atrial fibrillation CVA hypothyroidism and dementia with behaviors.  She also recently tested positive for COVID-19 and was in isolation but remained essentially asymptomatic.  She does have some history of paranoid behaviors and confusion.  Actually I saw her podiatrist last week.  He is followed by psychiatric services and continues on Seroquel twice a day as well as Zoloft and Remeron Remeron I believe is more for appetite stimulation.   also has trazodone at night  Apparently yesterday she had increased behaviors and was refusing  medications she was sent to the ER for evaluation and possible admission.  Apparently when she got to the ER she was calm however and compliant and apparently she did take her meds just before EMS arrived at the facilit.  In the ER labs were done which did not show any great abnormality from her norm-urinalysis and culture also was done results are pending from the culture urinalysis showed rare bacteria.  Currently she is back in the facility she is in the isolation unit since she did go out of the facility.  Nursing states she has been compliant today with taking her medications-vital signs appear to be stable she does not really have any complaints she was cooperative with exam although she did not appear to be very enthused  about it   Past Medical History:  Diagnosis Date  . A-fib ([place]) 01/07/2012  . ANEMIA 07/29/2010   Qualifier: Diagnosis of  By: Bobby Rumpf CMA (AAMA), Patty    . Angina pectoris ([place])   . Anxiety   . Asthma 01/07/2012  . Barrett's esophagus   . Bronchitis   . 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 Chest pain   . Coronary artery disease    right carotid occlusion   . Dementia with behavioral disturbance ([place]) 09/02/2016  . Depression   . Dysrhythmia    atrial fibrillation  . GERD (gastroesophageal reflux disease)   . 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 Hyperlipidemia   . Hypertension   . Mood disorder ([place])   . Paranoia ([place])  10/03/2016  . Recurrent upper respiratory infection (URI)   . Stroke [facility])    2007, after left endarterectomy   Past Surgical History:  Procedure Laterality Date  . ABDOMINAL HYSTERECTOMY    . BALLOON DILATION  10/18/2012   Procedure: BALLOON DILATION;  Surgeon: Lear Ng, MD;  Location: [facility];  Service: Endoscopy;  Laterality: N/A;  . CARDIAC CATHETERIZATION    . CAROTID ENDARTERECTOMY     left, complicated by stroke  . CHOLECYSTECTOMY     incidental at time of colectomy  . COLONOSCOPY  12/01/2011   Procedure:  COLONOSCOPY;  Surgeon: Lear Ng, MD;  [facility];  Service: Endoscopy;  Laterality: N/A;  . COLONOSCOPY  10/18/2012   Procedure: COLONOSCOPY;  Surgeon: Lear Ng, MD;  [facility];  Service: Endoscopy;  Laterality: N/A;  colonic dilation  . ESOPHAGOGASTRODUODENOSCOPY  12/01/2011   Procedure: ESOPHAGOGASTRODUODENOSCOPY (EGD);  Surgeon: Lear Ng, MD;  Location[facility];  Service: Endoscopy;  Laterality: N/A;  . HOT HEMOSTASIS  12/01/2011   Procedure: HOT HEMOSTASIS (ARGON PLASMA COAGULATION/BICAP);  Surgeon: Lear Ng, MD;  Location: [facility];  Service: Endoscopy;  Laterality: N/A;  . 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 alendronate (FOSAMAX) 70 MG tablet Take 70 mg by mouth every Wednesday. Take with a full glass of water on an empty stomach.  . carvedilol (COREG) 3.125 MG tablet Take 1 tablet (3.125 mg total) by mouth 2 (two) times daily with a meal.  . Cholecalciferol (VITAMIN D3) 1.25 MG (50000 UT) CAPS Take 1 capsule by mouth once a week.   . isosorbide mononitrate (IMDUR) 30 MG 24 hr tablet Take 30 mg by mouth daily.  [place] Kitchen levothyroxine (SYNTHROID, LEVOTHROID) 25 MCG tablet Take 25 mcg by mouth daily before breakfast.   . mirtazapine (REMERON) 15 MG tablet Take 7.5 mg by mouth at bedtime. 1/2 TAB  . Multiple Vitamins-Minerals (PRESERVISION AREDS 2 PO) Take 1 capsule by mouth 2 (two) times daily.   . pantoprazole sodium (PROTONIX) 40 mg/20 mL PACK Take 40 mg by mouth daily. Mix 1 packet on 1 teaspoonful of applesauce or 5 mL apple juice and take daily, DO NOT ADMINISTER WITH ANY OTHER LIQUIDS OR FOODS  . QUEtiapine (SEROQUEL XR) 400 MG 24 hr tablet Take 400 mg by mouth at bedtime. Take 1 tablet along with a 50 mg tablet to = 450 mg  . QUEtiapine (SEROQUEL) 100 MG tablet Take 150 mg by mouth  daily. Take 1-1/2 tablets to = 150 mg  . QUEtiapine (SEROQUEL) 25 MG tablet Take 25 mg by mouth daily.  . rivaroxaban (XARELTO) 10 MG TABS tablet Take 15 mg by mouth daily. 1 and 1/2 tablets to = 15 mg  . sertraline (ZOLOFT) 25 MG tablet Take 25 mg by mouth daily. Take 1 tablet, along with Sertraline 50 mg to equal 75mg , by mouth daily  . sertraline (ZOLOFT) 50 MG tablet Take 75 mg by mouth daily. TAKE 1 AND 1/2 TABLETS (75MG ) BY MOUTH ONCE DAILY MOOD/DEPRESSION  . simvastatin (ZOCOR) 10 MG tablet Take 10 mg by mouth at bedtime.  . traZODone (DESYREL) 100 MG tablet Take 100 mg by mouth at bedtime.    No facility-administered encounter medications on file as of 08/01/2019.     Review of Systems  General she is not complaining of any  fever chills.  Skin does not complain of rashes itching diaphoresis.  Head ears eyes nose mouth and throat not complaining of visual changes or sore throat.  Respiratory denies shortness of breath or cough.  Cardiac does not complain of chest pain or increased edema.  GI is not complaining of abdominal discomfort nausea vomiting diarrhea constipation-.  GU does not complain of dysuria again a urine culture is pending drawn at the ER.  Musculoskeletal does not complain of joint pain currently.  Neurologic does not complain of being dizzy or having a headache or feeling syncopal.  Psych-does have a fairly extensive history as noted above she does not really complain of being depressed or anxious  - She has taken her medications today.     Immunization History  Administered Date(s) Administered  . Influenza-Unspecified 08/26/2016, 10/21/2017  . PPD Test 08/16/2016  . Pneumococcal Conjugate-13 07/15/2018  . Pneumococcal Polysaccharide-23 01/08/2012   Pertinent  Health Maintenance Due  Topic Date Due  . INFLUENZA VACCINE  06/30/2019  . DEXA SCAN  11/30/2019 (Originally 08/15/2004)  . PNA vac Low Risk Adult  Completed   Fall Risk  07/13/2018  07/07/2017  Falls in the past year? Yes No  Number falls in past yr: 1 -  Injury with Fall? No -   Functional Status Survey:    Temperature is 98.0 pulse 84 respirations 19 blood pressure 127/76 oxygen saturation is 97% on room air.    Physical Exam   General this is a fairly well-nourished elderly female no distress lying in bed she was sleeping but easily arousable and alert when awoken.  Her skin is warm and dry.  Eyes visual acuity appears to be intact sclera and conjunctive are clear.  Oropharynx clear mucous membranes moist.  Chest is clear to auscultation with somewhat shallow air entry there is no labored breathing.  Heart is largely regular rate and rhythm has some occasional irregular beat she has trace lower extremity edema   appears to be baseline.  Her abdomen is somewhat obese soft nontender with positive bowel sounds.  Musculoskeletal does move all extremities x4 it appears at baseline somewhat limited exam since she is in bed.  Neurologic appears grossly intact cannot really appreciate lateralizing findings.  Psych she is oriented to self she is cooperative although does not appear too enthusedabout the examand questions Per nursing she has been taking her medications and has been largely cooperative today  Labs reviewed:  August 01, 2019.  Sodium 136 potassium 4.4 BUN 35.5 creatinine 1.71.  TSH 2.61.  WBC 7.6 hemoglobin 12.5 platelets 192   Recent Labs    07/17/19 07/23/19 07/31/19 1458  NA 137 142 134*  K 5.0 4.3 4.0  CL  --   --  99  CO2  --   --  25  GLUCOSE  --   --  107*  BUN 40* 40* 37*  CREATININE 1.5* 1.6* 1.48*  CALCIUM  --   --  9.5   Recent Labs    12/11/18 07/17/19 07/31/19 1458  AST 11* 14 23  ALT 6* 10 18  ALKPHOS 61 60 66  BILITOT  --   --  1.2  PROT  --   --  7.2  ALBUMIN  --   --  4.0   Recent Labs    12/21/18 07/17/19 07/31/19 1458  WBC 5.0 4.2 7.0  NEUTROABS  --  2 5.4  HGB 11.7* 11.2* 12.5  HCT 37 33*  39.0  MCV  --   --  89.7  PLT 162 126* 192   Lab Results  Component Value Date   TSH 2.50 12/11/2018   Lab Results  Component Value Date   HGBA1C 5.9 07/17/2019   Lab Results  Component Value Date   CHOL 130 12/11/2018   HDL 49 12/11/2018   LDLCALC 64 12/11/2018   TRIG 84 12/11/2018   CHOLHDL 1.9 06/19/2017    Significant Diagnostic Results in last 30 days:  Dg Chest Port 1 View  Result Date: 07/31/2019 CLINICAL DATA:  Altered mental status. EXAM: PORTABLE CHEST 1 VIEW COMPARISON:  11/09/2017 FINDINGS: Remote proximal right humerus fracture. Midline trachea. Mild cardiomegaly. Atherosclerosis in the transverse aorta. No pleural effusion or pneumothorax. Clear lungs. No congestive failure. IMPRESSION: Mild cardiomegaly, without acute disease. Aortic Atherosclerosis (ICD10-I70.0). Electronically Signed   By: Abigail Miyamoto M.D.   On: 07/31/2019 15:14    Assessment/Plan #1 history of dementia with behaviors- again this did precipitate a visit to the ER last night apparently by the time she got to the ER she was compliant cooperative- per nursing this remains the situation with patient today- this continues to be a challenging issue however. She has been followed by psychiatry this is been complicated somewhat with coronavirus --but I suspect will need follow-up by psychiatry when this can be arranged.  Also will await results of the urine culture.  2.  Renal insufficiency it appears recent creatinines have been somewhat above recent baseline last few weeks creatinine has been in the 1.5---1.6 area was 1.7 on today's lab but actually was 1.48 in the ER last night there appears to be some variability.  I did speak with her and nursing staff about encouraging fluids   willupdate this later in the week to see if there is a true trend here.  She is not on any diuretic and her lisinopril has been discontinued   [id number]-note greater than 25 minutes spent assessing patient-discussing her  status with nursing staff- reviewing her chart and labs- coordinating a plan of care-of note greater than 50% of time spent coordinating a plan of care with input as noted above         Granville Lewis, PA-C [phone]

## 2019-08-02 ENCOUNTER — Encounter: Payer: Self-pay | Admitting: Internal Medicine

## 2019-08-03 ENCOUNTER — Encounter: Payer: Self-pay | Admitting: Internal Medicine

## 2019-08-03 LAB — BASIC METABOLIC PANEL
BUN: 24 — AB (ref 4–21)
Creatinine: 1.2 — AB (ref 0.5–1.1)
Glucose: 103
Potassium: 4.3 (ref 3.4–5.3)
Sodium: 139 (ref 137–147)

## 2019-08-03 NOTE — Progress Notes (Signed)
Location:  [place] of Service:   SNF  [name], MD  Patient Care Team: [name], MD as PCP - General (Internal Medicine)  Extended Emergency Contact Information Primary Emergency Contact: Tyner,[name] Address: [address] [place], [place] [address] [name] Work Phone: [phone] Mobile Phone: [phone] Relation: Daughter Secondary Emergency Contact: Jazmine, Petrosian Mobile Phone: [phone] Relation: Son    Allergies: Patient has no known allergies.  Chief Complaint  Patient presents with  . Acute Visit    HPI: Patient is 78 y.o. female who is being seen because a routine lab returned with patient's creatinine being 1.57 which is higher than is been prior, 4 months ago being 1.2 and 7 months ago being 1.2 BUN is 40, 4 months it was 22 7 months ago was 31.  Patient is reported to be eating 75% of her meals and drinking 240 to 480 cc with each meal.  Past Medical History:  Diagnosis Date  . A-fib ([place]) 01/07/2012  . ANEMIA 07/29/2010   Qualifier: Diagnosis of  By: Bobby Rumpf CMA (AAMA), Patty    . Angina pectoris ([place])   . Anxiety   . Asthma 01/07/2012  . Barrett's esophagus   . Bronchitis   . 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 Chest pain   . Coronary artery disease    right carotid occlusion   . Dementia with behavioral disturbance ([place]) 09/02/2016  . Depression   . Dysrhythmia    atrial fibrillation  . GERD (gastroesophageal reflux disease)   . 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 Hyperlipidemia   . Hypertension   . Mood disorder ([place])   . Paranoia ([place]) 10/03/2016  . Recurrent upper respiratory infection (URI)   . Stroke [facility])    2007, after left endarterectomy    Past Surgical History:  Procedure Laterality Date  . ABDOMINAL HYSTERECTOMY    . BALLOON DILATION  10/18/2012   Procedure: BALLOON DILATION;  Surgeon: Lear Ng, MD;  [facility];  Service: Endoscopy;   Laterality: N/A;  . CARDIAC CATHETERIZATION    . CAROTID ENDARTERECTOMY     left, complicated by stroke  . CHOLECYSTECTOMY     incidental at time of colectomy  . COLONOSCOPY  12/01/2011   Procedure: COLONOSCOPY;  Surgeon: Lear Ng, MD;  [facility];  Service: Endoscopy;  Laterality: N/A;  . COLONOSCOPY  10/18/2012   Procedure: COLONOSCOPY;  Surgeon: Lear Ng, MD;  [facility];  Service: Endoscopy;  Laterality: N/A;  colonic dilation  . ESOPHAGOGASTRODUODENOSCOPY  12/01/2011   Procedure: ESOPHAGOGASTRODUODENOSCOPY (EGD);  Surgeon: Lear Ng, MD;  Location: [facility];  Service: Endoscopy;  Laterality: N/A;  . HOT HEMOSTASIS  12/01/2011   Procedure: HOT HEMOSTASIS (ARGON PLASMA COAGULATION/BICAP);  Surgeon: Lear Ng, MD;  Location: [facility];  Service: Endoscopy;  Laterality: N/A;  . 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 If you have any questions, ask your nurse or doctor.        alendronate 70 MG tablet Commonly known as: FOSAMAX Take 70 mg by mouth every Wednesday. Take with a full glass of water on an  empty stomach.   carvedilol 3.125 MG tablet Commonly known as: COREG Take 1 tablet (3.125 mg total) by mouth 2 (two) times daily with a meal.   Ensure Take 237 mLs by mouth 2 (two) times daily between meals. Due to weight loss   isosorbide mononitrate 30 MG 24 hr tablet Commonly known as: IMDUR Take 30 mg by mouth daily.   levothyroxine 25 MCG tablet Commonly known as: SYNTHROID Take 25 mcg by mouth daily before breakfast.   lisinopril 2.5 MG tablet Commonly known as: ZESTRIL Take 2.5 mg by mouth daily.   mirtazapine 15 MG tablet Commonly known as: REMERON Take 7.5 mg by mouth at bedtime. 1/2 TAB   PRESERVISION AREDS 2 PO Take 1 capsule by  mouth 2 (two) times daily.   Protonix 40 mg/20 mL Pack Generic drug: pantoprazole sodium Take 40 mg by mouth daily. Mix 1 packet on 1 teaspoonful of applesauce or 5 mL apple juice and take daily, DO NOT ADMINISTER WITH ANY OTHER LIQUIDS OR FOODS   QUEtiapine 100 MG tablet Commonly known as: SEROQUEL Take 150 mg by mouth daily. Take 1-1/2 tablets to = 150 mg   QUEtiapine 400 MG 24 hr tablet Commonly known as: SEROQUEL XR Take 400 mg by mouth at bedtime. Take 1 tablet along with a 50 mg tablet to = 450 mg   QUEtiapine 50 MG tablet Commonly known as: SEROQUEL Take 50 mg by mouth at bedtime. Take 1 tablet along with a 400 mg tablet to = 450 mg   rivaroxaban 10 MG Tabs tablet Commonly known as: XARELTO Take 15 mg by mouth daily. 1 and 1/2 tablets to = 15 mg   sertraline 50 MG tablet Commonly known as: ZOLOFT Take 75 mg by mouth daily. TAKE 1 AND 1/2 TABLETS (75MG ) BY MOUTH ONCE DAILY MOOD/DEPRESSION   sertraline 25 MG tablet Commonly known as: ZOLOFT Take 25 mg by mouth daily. Take 1 tablet, along with Sertraline 50 mg to equal 75mg , by mouth daily   simvastatin 10 MG tablet Commonly known as: ZOCOR Take 10 mg by mouth at bedtime.   traZODone 100 MG tablet Commonly known as: DESYREL Take 100 mg by mouth at bedtime.   Vitamin D3 1.25 MG (50000 UT) Caps Take 1 capsule by mouth once a week.       No orders of the defined types were placed in this encounter.   Immunization History  Administered Date(s) Administered  . Influenza-Unspecified 08/26/2016, 10/21/2017  . PPD Test 08/16/2016  . Pneumococcal Conjugate-13 07/15/2018  . Pneumococcal Polysaccharide-23 01/08/2012    Social History   Tobacco Use  . Smoking status: Former Smoker    Quit date: 04/09/2003    Years since quitting: 16.3  . Smokeless tobacco: Never Used  Substance Use Topics  . Alcohol use: No    Review of Systems  DATA OBTAINED: from patient, nurse GENERAL:  no fevers, fatigue, appetite  changes SKIN: No itching, rash HEENT: No complaint RESPIRATORY: No cough, wheezing, SOB CARDIAC: No chest pain, palpitations, lower extremity edema  GI: No abdominal pain, No N/V/D or constipation, No heartburn or reflux  GU: No dysuria, frequency or urgency, or incontinence  MUSCULOSKELETAL: No unrelieved bone/joint pain NEUROLOGIC: No headache, dizziness  PSYCHIATRIC: No overt anxiety or sadness  Vitals:   08/03/19 1513  BP: (!) 145/79  Pulse: 80  Resp: 18  Temp: 98.6 F (37 C)   Body mass index is 28.9 kg/m. Physical Exam  GENERAL APPEARANCE: Alert, conversant, No acute  distress  SKIN: No diaphoresis rash HEENT: Unremarkable RESPIRATORY: Breathing is even, unlabored. Lung sounds are clear   CARDIOVASCULAR: Heart RRR no murmurs, rubs or gallops. No peripheral edema  GASTROINTESTINAL: Abdomen is soft, non-tender, not distended w/ normal bowel sounds.  GENITOURINARY: Bladder non tender, not distended  MUSCULOSKELETAL: No abnormal joints or musculature NEUROLOGIC: Cranial nerves 2-12 grossly intact. Moves all extremities PSYCHIATRIC: Mood and affect appropriate to situation, but odd, no behavioral issues  Patient Active Problem List   Diagnosis Date Noted  . Real time reverse transcriptase PCR positive for COVID-19 virus 07/09/2019  . Infection requiring airborne isolation precautions 07/09/2019  . 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 Macular degeneration 12/09/2018  . Psychoses ([place]) 12/19/2017  . Insomnia 12/19/2017  . Urinary tract infection due to extended-spectrum beta lactamase (ESBL) producing Escherichia coli 11/17/2017  . Dysphagia, oropharyngeal 11/15/2017  . Altered mental status 11/10/2017  . Acute metabolic encephalopathy [id number]  . Mania ([place]) 06/21/2017  . Hypomagnesemia 06/19/2017  . Essential hypertension 06/18/2017  . Acute encephalopathy 06/17/2017  . Acute lower UTI 06/17/2017  . E. coli UTI 06/17/2017  . Depression 04/24/2017  .  Hypothyroidism 10/03/2016  . Paranoia ([place]) 10/03/2016  . LOC (loss of consciousness) ([place]) 09/02/2016  . 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 Dementia with behavioral disturbance ([place]) 09/02/2016  . Asthma with acute exacerbation 08/21/2016  . Atrial fibrillation with RVR ([place]) 08/21/2016  . Elevated INR 08/21/2016  . Chronic systolic heart failure ([place]) 08/21/2016  . Hyperlipidemia 08/21/2016  . Electrolyte abnormality 08/21/2016  . HCAP (healthcare-associated pneumonia) 08/10/2016  . Sepsis ([place]) 08/09/2016  . Coronary atherosclerosis of native coronary artery 01/21/2014  . Obesity 01/17/2014  . Abdominal pain, generalized 10/18/2012  . GERD (gastroesophageal reflux disease)   . Mood disorder ([place])   . Anxiety   . 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[place])   . Stroke ([place])   . Chest pain 01/07/2012  . Asthma 01/07/2012  . A-fib ([place]) 01/07/2012  . 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 Anemia 01/07/2012  . ANEMIA 07/29/2010  . BARRETTS ESOPHAGUS 06/04/2010    CMP     Component Value Date/Time   NA 134 (L) 07/31/2019 1458   NA 142 07/23/2019   K 4.0 07/31/2019 1458   CL 99 07/31/2019 1458   CO2 25 07/31/2019 1458   GLUCOSE 107 (H) 07/31/2019 1458   BUN 37 (H) 07/31/2019 1458   BUN 40 (A) 07/23/2019   CREATININE 1.48 (H) 07/31/2019 1458   CALCIUM 9.5 07/31/2019 1458   PROT 7.2 07/31/2019 1458   ALBUMIN 4.0 07/31/2019 1458   AST 23 07/31/2019 1458   ALT 18 07/31/2019 1458   ALKPHOS 66 07/31/2019 1458   BILITOT 1.2 07/31/2019 1458   GFRNONAA 33 (L) 07/31/2019 1458   GFRAA 39 (L) 07/31/2019 1458   Recent Labs    07/17/19 07/23/19 07/31/19 1458  NA 137 142 134*  K 5.0 4.3 4.0  CL  --   --  99  CO2  --   --  25  GLUCOSE  --   --  107*  BUN 40* 40* 37*  CREATININE 1.5* 1.6* 1.48*  CALCIUM  --   --  9.5   Recent Labs    12/11/18 07/17/19 07/31/19 1458  AST 11* 14 23  ALT 6* 10 18  ALKPHOS 61 60 66   BILITOT  --   --  1.2  PROT  --   --  7.2  ALBUMIN  --   --  4.0   Recent Labs    12/21/18 07/17/19 07/31/19 1458  WBC 5.0 4.2 7.0  NEUTROABS  --  2 5.4  HGB 11.7* 11.2* 12.5  HCT 37 33* 39.0  MCV  --   --  89.7  PLT 162 126* 192   Recent Labs    12/11/18  CHOL 130  LDLCALC 64  TRIG 84   No results found for: [facility] Lab Results  Component Value Date   TSH 2.50 12/11/2018   Lab Results  Component Value Date   HGBA1C 5.9 07/17/2019   Lab Results  Component Value Date   CHOL 130 12/11/2018   HDL 49 12/11/2018   LDLCALC 64 12/11/2018   TRIG 84 12/11/2018   CHOLHDL 1.9 06/19/2017    Significant Diagnostic Results in last 30 days:  Dg Chest Port 1 View  Result Date: 07/31/2019 CLINICAL DATA:  Altered mental status. EXAM: PORTABLE CHEST 1 VIEW COMPARISON:  11/09/2017 FINDINGS: Remote proximal right humerus fracture. Midline trachea. Mild cardiomegaly. Atherosclerosis in the transverse aorta. No pleural effusion or pneumothorax. Clear lungs. No congestive failure. IMPRESSION: Mild cardiomegaly, without acute disease. Aortic Atherosclerosis (ICD10-I70.0). Electronically Signed   By: Abigail Miyamoto M.D.   On: 07/31/2019 15:14    Assessment and Plan  Acute renal failure, with BUN/creatinine 1.57- DC lisinopril two-point milligrams daily, and start 120 cc every 2 hours x8 times a day while awake with repeat BMP in 1 week      [name], MD

## 2019-08-16 LAB — LIPID PANEL
Cholesterol: 140 (ref 0–200)
HDL: 57 (ref 35–70)
LDL Cholesterol: 58
LDl/HDL Ratio: 2.5
Triglycerides: 128 (ref 40–160)

## 2019-08-29 ENCOUNTER — Encounter: Payer: Self-pay | Admitting: Internal Medicine

## 2019-08-29 NOTE — Progress Notes (Deleted)
Location:  [facility] Room Number: 305-W Place of Service:  SNF [phone]) Provider:  Granville Lewis, PA-C  [name], MD  Patient Care Team: [name], MD as PCP - General (Internal Medicine)  Extended Emergency Contact Information Primary Emergency Contact: Tyner,[name] Address: [address] [place], [place] [address] Cindy [name] of [place] Work Phone: [phone] Mobile Phone: [phone] Relation: Daughter Secondary Emergency Contact: Iline, Mouser Mobile Phone: [phone] Relation: Son  Code Status:  DNR Goals of care: Advanced Directive information Advanced Directives 07/31/2019  Does Patient Have a Medical Advance Directive? Yes  Type of Advance Directive Out of facility DNR (pink MOST or yellow form)  Does patient want to make changes to medical advance directive? -  Copy of [place] in Chart? -  Would patient like information on creating a medical advance directive? -  Pre-existing out of facility DNR order (yellow form or pink MOST form) Yellow form placed in chart (order not valid for inpatient use)     Chief Complaint  Patient presents with  . Medical Management of Chronic Issues    Routine [facility] visit    HPI:  Pt is an 78 y.o. female seen today for medical management of chronic diseases.     Past Medical History:  Diagnosis Date  . A-fib ([place]) 01/07/2012  . ANEMIA 07/29/2010   Qualifier: Diagnosis of  By: Bobby Rumpf CMA (AAMA), Patty    . Angina pectoris ([place])   . Anxiety   . Asthma 01/07/2012  . Barrett's esophagus   . Bronchitis   . 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 Chest pain   . Coronary artery disease    right carotid occlusion   . Dementia with behavioral disturbance ([place]) 09/02/2016  . Depression   . Dysrhythmia    atrial fibrillation  . GERD (gastroesophageal reflux disease)   . 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 Hyperlipidemia   . Hypertension   . Mood  disorder ([place])   . Paranoia ([place]) 10/03/2016  . Recurrent upper respiratory infection (URI)   . Stroke [facility])    2007, after left endarterectomy   Past Surgical History:  Procedure Laterality Date  . ABDOMINAL HYSTERECTOMY    . BALLOON DILATION  10/18/2012   Procedure: BALLOON DILATION;  Surgeon: Lear Ng, MD;  Location: [facility] ENDOSCOPY;  Service: Endoscopy;  Laterality: N/A;  . CARDIAC CATHETERIZATION    . CAROTID ENDARTERECTOMY     left, complicated by stroke  . CHOLECYSTECTOMY     incidental at time of colectomy  . COLONOSCOPY  12/01/2011   Procedure: COLONOSCOPY;  Surgeon: Lear Ng, MD;  [facility]: [facility];  Service: Endoscopy;  Laterality: N/A;  . COLONOSCOPY  10/18/2012   Procedure: COLONOSCOPY;  Surgeon: Lear Ng, MD;  [facility];  Service: Endoscopy;  Laterality: N/A;  colonic dilation  . ESOPHAGOGASTRODUODENOSCOPY  12/01/2011   Procedure: ESOPHAGOGASTRODUODENOSCOPY (EGD);  Surgeon: Lear Ng, MD;  Location: [facility];  Service: Endoscopy;  Laterality: N/A;  . HOT HEMOSTASIS  12/01/2011   Procedure: HOT HEMOSTASIS (ARGON PLASMA COAGULATION/BICAP);  Surgeon: Lear Ng, MD;  Location: [facility];  Service: Endoscopy;  Laterality: N/A;  . 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  acetaminophen (TYLENOL) 325 MG tablet Take 650 mg by mouth every 6 (six) hours as needed for mild pain or moderate pain.  Marland Kitchen alendronate (FOSAMAX) 70 MG tablet Take 70 mg by mouth every Wednesday. Take with a full glass of water on an empty stomach.  . carvedilol (COREG) 3.125 MG tablet Take 1 tablet (3.125 mg total) by mouth 2 (two) times daily with a meal.  . Cholecalciferol (VITAMIN D3) 1.25 MG (50000 UT) CAPS Take 1 capsule by mouth once a week.   . Ensure (ENSURE) Take 237 mLs by mouth 2 (two) times daily.   . isosorbide mononitrate (IMDUR) 30 MG 24 hr tablet Take 30 mg by mouth daily.  Marland Kitchen levothyroxine (SYNTHROID, LEVOTHROID) 25 MCG tablet Take 25 mcg by mouth daily before breakfast.   . mirtazapine (REMERON) 15 MG tablet Take 7.5 mg by mouth at bedtime. 1/2 TAB  . pantoprazole sodium (PROTONIX) 40 mg/20 mL PACK Take 40 mg by mouth daily. Mix 1 packet on 1 teaspoonful of applesauce or 5 mL apple juice and take daily, DO NOT ADMINISTER WITH ANY OTHER LIQUIDS OR FOODS  . QUEtiapine (SEROQUEL XR) 400 MG 24 hr tablet Take 400 mg by mouth at bedtime. Take 1 tablet along with a 100 mg tablet to = 500 mg  . QUEtiapine (SEROQUEL) 100 MG tablet Take 100 mg by mouth at bedtime. Take along with a 400 mg tablet to = 500 mg  . QUEtiapine (SEROQUEL) 200 MG tablet Take 200 mg by mouth every morning.  . rivaroxaban (XARELTO) 10 MG TABS tablet Take 15 mg by mouth daily. 1 and 1/2 tablets to = 15 mg  . sertraline (ZOLOFT) 25 MG tablet Take 25 mg by mouth daily. Take 1 tablet, along with Sertraline 50 mg to equal 75mg , by mouth daily  . sertraline (ZOLOFT) 50 MG tablet Take 75 mg by mouth daily. TAKE 1 AND 1/2 TABLETS (75MG ) BY MOUTH ONCE DAILY MOOD/DEPRESSION  . simvastatin (ZOCOR) 10 MG tablet Take 10 mg by mouth at bedtime.  . traZODone (DESYREL) 100 MG tablet Take 100 mg by mouth at bedtime.   . [DISCONTINUED] QUEtiapine (SEROQUEL) 200 MG tablet Take 200 mg by mouth at bedtime.  . [DISCONTINUED] QUEtiapine (SEROQUEL) 25 MG tablet Take 25 mg by mouth daily.  . [DISCONTINUED] sertraline (ZOLOFT) 50 MG tablet Take 50 mg by mouth daily.   No facility-administered encounter medications on file as of 08/29/2019.     Review of Systems  Immunization History  Administered Date(s) Administered  . Influenza-Unspecified 08/26/2016, 10/21/2017  . PPD Test 08/16/2016  . Pneumococcal Conjugate-13 07/15/2018  . Pneumococcal Polysaccharide-23 01/08/2012   Pertinent  Health Maintenance Due  Topic Date Due  . INFLUENZA  VACCINE  06/30/2019  . DEXA SCAN  11/30/2019 (Originally 08/15/2004)  . PNA vac Low Risk Adult  Completed   Fall Risk  07/13/2018 07/07/2017  Falls in the past year? Yes No  Number falls in past yr: 1 -  Injury with Fall? No -   Functional Status Survey:    Vitals:   08/29/19 0909  BP: 131/62  Pulse: (!) 59  Resp: 18  Temp: (!) 97 F (36.1 C)  TempSrc: Oral  SpO2: 95%  Weight: 142 lb 3.2 oz (64.5 kg)  Height: 5' (1.524 m)   Body mass index is 27.77 kg/m. Physical Exam  Labs reviewed: Recent Labs    07/17/19 07/23/19 07/31/19 1458  NA 137 142 134*  K 5.0 4.3 4.0  CL  --   --  99  CO2  --   --  25  GLUCOSE  --   --  107*  BUN 40* 40* 37*  CREATININE 1.5* 1.6* 1.48*  CALCIUM  --   --  9.5   Recent Labs    12/11/18 07/17/19 07/31/19 1458  AST 11* 14 23  ALT 6* 10 18  ALKPHOS 61 60 66  BILITOT  --   --  1.2  PROT  --   --  7.2  ALBUMIN  --   --  4.0   Recent Labs    12/21/18 07/17/19 07/31/19 1458  WBC 5.0 4.2 7.0  NEUTROABS  --  2 5.4  HGB 11.7* 11.2* 12.5  HCT 37 33* 39.0  MCV  --   --  89.7  PLT 162 126* 192   Lab Results  Component Value Date   TSH 2.50 12/11/2018   Lab Results  Component Value Date   HGBA1C 5.9 07/17/2019   Lab Results  Component Value Date   CHOL 130 12/11/2018   HDL 49 12/11/2018   LDLCALC 64 12/11/2018   TRIG 84 12/11/2018   CHOLHDL 1.9 06/19/2017    Significant Diagnostic Results in last 30 days:  Dg Chest Port 1 View  Result Date: 07/31/2019 CLINICAL DATA:  Altered mental status. EXAM: PORTABLE CHEST 1 VIEW COMPARISON:  11/09/2017 FINDINGS: Remote proximal right humerus fracture. Midline trachea. Mild cardiomegaly. Atherosclerosis in the transverse aorta. No pleural effusion or pneumothorax. Clear lungs. No congestive failure. IMPRESSION: Mild cardiomegaly, without acute disease. Aortic Atherosclerosis (ICD10-I70.0). Electronically Signed   By: Abigail Miyamoto M.D.   On: 07/31/2019 15:14    Assessment/Plan There are no  diagnoses linked to this encounter.   Family/ staff Communication: ***  Labs/tests ordered:  ***

## 2019-09-01 NOTE — Progress Notes (Signed)
This encounter was created in error - please disregard.

## 2019-09-04 ENCOUNTER — Non-Acute Institutional Stay (SKILLED_NURSING_FACILITY): Payer: Medicare Other | Admitting: Internal Medicine

## 2019-09-04 ENCOUNTER — Encounter: Payer: Self-pay | Admitting: Internal Medicine

## 2019-09-04 DIAGNOSIS — I25119 Atherosclerotic heart disease of native coronary artery with unspecified angina pectoris: Secondary | ICD-10-CM

## 2019-09-04 DIAGNOSIS — I480 Paroxysmal atrial fibrillation: Secondary | ICD-10-CM

## 2019-09-04 DIAGNOSIS — I5022 Chronic systolic (congestive) heart failure: Secondary | ICD-10-CM | POA: Diagnosis not present

## 2019-09-04 LAB — BASIC METABOLIC PANEL
BUN: 35 — AB (ref 4–21)
Creatinine: 1.4 — AB (ref 0.5–1.1)
Glucose: 85
Potassium: 4.4 (ref 3.4–5.3)
Sodium: 139 (ref 137–147)

## 2019-09-04 LAB — TSH: TSH: 5.63 (ref 0.41–5.90)

## 2019-09-04 NOTE — Progress Notes (Signed)
Location:  [facility] Room Number: 305-W Place of Service:  SNF (31)  [name], MD  Patient Care Team: [name], MD as PCP - General (Internal Medicine)  Extended Emergency Contact Information Primary Emergency Contact: Tyner,[name] Address: [address] [place], [place] [address] [name] Work Phone: [phone] Mobile Phone: [phone] Relation: Daughter Secondary Emergency Contact: Markida, Varnell Mobile Phone: [phone] Relation: Son    Allergies: Patient has no known allergies.  Chief Complaint  Patient presents with  . Medical Management of Chronic Issues    Routine [facility] visit    HPI: Patient is an 78 y.o. female who is being seen for routine issues of atrial fibrillation, chronic systolic congestive heart failure and coronary artery disease.  Past Medical History:  Diagnosis Date  . A-fib ([place]) 01/07/2012  . ANEMIA 07/29/2010   Qualifier: Diagnosis of  By: Bobby Rumpf CMA (AAMA), Patty    . Angina pectoris ([place])   . Anxiety   . Asthma 01/07/2012  . Barrett's esophagus   . Bronchitis   . 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 Chest pain   . Coronary artery disease    right carotid occlusion   . Dementia with behavioral disturbance ([place]) 09/02/2016  . Depression   . Dysrhythmia    atrial fibrillation  . GERD (gastroesophageal reflux disease)   . 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 Hyperlipidemia   . Hypertension   . Mood disorder ([place])   . Paranoia ([place]) 10/03/2016  . Recurrent upper respiratory infection (URI)   . Stroke [facility])    2007, after left endarterectomy    Past Surgical History:  Procedure Laterality Date  . ABDOMINAL HYSTERECTOMY    . BALLOON DILATION  10/18/2012   Procedure: BALLOON DILATION;  Surgeon: Lear Ng, MD;  [facility];  Service: Endoscopy;  Laterality: N/A;  . CARDIAC CATHETERIZATION    . CAROTID ENDARTERECTOMY     left,  complicated by stroke  . CHOLECYSTECTOMY     incidental at time of colectomy  . COLONOSCOPY  12/01/2011   Procedure: COLONOSCOPY;  Surgeon: Lear Ng, MD;  [facility];  Service: Endoscopy;  Laterality: N/A;  . COLONOSCOPY  10/18/2012   Procedure: COLONOSCOPY;  Surgeon: Lear Ng, MD;  [facility];  Service: Endoscopy;  Laterality: N/A;  colonic dilation  . ESOPHAGOGASTRODUODENOSCOPY  12/01/2011   Procedure: ESOPHAGOGASTRODUODENOSCOPY (EGD);  Surgeon: Lear Ng, MD;  Location: [facility];  Service: Endoscopy;  Laterality: N/A;  . HOT HEMOSTASIS  12/01/2011   Procedure: HOT HEMOSTASIS (ARGON PLASMA COAGULATION/BICAP);  Surgeon: Lear Ng, MD;  Location: [facility];  Service: Endoscopy;  Laterality: N/A;  . 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 If you have any questions, ask your nurse or doctor.        acetaminophen 325 MG tablet Commonly known as: TYLENOL Take 650 mg by mouth every 6 (six) hours as needed for mild pain or moderate pain.   alendronate 70 MG tablet Commonly known as: FOSAMAX Take 70 mg by mouth every Wednesday. Take with a full glass of water on an empty stomach.  carvedilol 3.125 MG tablet Commonly known as: COREG Take 1 tablet (3.125 mg total) by mouth 2 (two) times daily with a meal.   Ensure Take 237 mLs by mouth 2 (two) times daily.   isosorbide mononitrate 30 MG 24 hr tablet Commonly known as: IMDUR Take 30 mg by mouth daily.   levothyroxine 25 MCG tablet Commonly known as: SYNTHROID Take 25 mcg by mouth daily before breakfast.   mirtazapine 15 MG tablet Commonly known as: REMERON Take 7.5 mg by mouth at bedtime. 1/2 TAB   Protonix 40 mg/20 mL Pack Generic drug: pantoprazole sodium Take 40 mg by mouth daily. Mix 1 packet on  1 teaspoonful of applesauce or 5 mL apple juice and take daily, DO NOT ADMINISTER WITH ANY OTHER LIQUIDS OR FOODS   QUEtiapine 400 MG 24 hr tablet Commonly known as: SEROQUEL XR Take 400 mg by mouth at bedtime. Take 1 tablet along with a 100 mg tablet to = 500 mg   QUEtiapine 100 MG tablet Commonly known as: SEROQUEL Take 100 mg by mouth at bedtime. Take along with a 400 mg tablet to = 500 mg   QUEtiapine 200 MG tablet Commonly known as: SEROQUEL Take 200 mg by mouth every morning.   rivaroxaban 10 MG Tabs tablet Commonly known as: XARELTO Take 15 mg by mouth daily. 1 and 1/2 tablets to = 15 mg   sertraline 50 MG tablet Commonly known as: ZOLOFT Take 75 mg by mouth daily. TAKE 1 AND 1/2 TABLETS (75MG ) BY MOUTH ONCE DAILY MOOD/DEPRESSION   sertraline 25 MG tablet Commonly known as: ZOLOFT Take 25 mg by mouth daily. Take 1 tablet, along with Sertraline 50 mg to equal 75mg , by mouth daily   simvastatin 10 MG tablet Commonly known as: ZOCOR Take 10 mg by mouth at bedtime.   traZODone 100 MG tablet Commonly known as: DESYREL Take 100 mg by mouth at bedtime.   Vitamin D3 1.25 MG (50000 UT) Caps Take 1 capsule by mouth once a week.       No orders of the defined types were placed in this encounter.   Immunization History  Administered Date(s) Administered  . Influenza-Unspecified 08/26/2016, 10/21/2017, 08/30/2019  . PPD Test 08/16/2016  . Pneumococcal Conjugate-13 07/15/2018  . Pneumococcal Polysaccharide-23 01/08/2012    Social History   Tobacco Use  . Smoking status: Former Smoker    Quit date: 04/09/2003    Years since quitting: 16.4  . Smokeless tobacco: Never Used  Substance Use Topics  . Alcohol use: No    Review of Systems  DATA OBTAINED: from patient, nurse GENERAL:  no fevers, fatigue, appetite changes SKIN: No itching, rash HEENT: No complaint RESPIRATORY: No cough, wheezing, SOB CARDIAC: No chest pain, palpitations, lower extremity edema  GI:  No abdominal pain, No N/V/D or constipation, No heartburn or reflux  GU: No dysuria, frequency or urgency, or incontinence  MUSCULOSKELETAL: No unrelieved bone/joint pain NEUROLOGIC: No headache, dizziness  PSYCHIATRIC: No overt anxiety or sadness  Vitals:   09/04/19 1030  BP: 130/74  Pulse: 77  Resp: 18  Temp: (!) 97 F (36.1 C)  SpO2: 94%   Body mass index is 27.77 kg/m. Physical Exam  GENERAL APPEARANCE: Alert, conversant, No acute distress  SKIN: No diaphoresis rash HEENT: Unremarkable RESPIRATORY: Breathing is even, unlabored. Lung sounds are clear   CARDIOVASCULAR: Heart RRR no murmurs, rubs or gallops. No peripheral edema  GASTROINTESTINAL: Abdomen is soft, non-tender, not distended w/ normal bowel sounds.  GENITOURINARY: Bladder  non tender, not distended  MUSCULOSKELETAL: No abnormal joints or musculature NEUROLOGIC: Cranial nerves 2-12 grossly intact. Moves all extremities PSYCHIATRIC: Mood and affect appropriate to situation, patient does have paranoia on and off  Patient Active Problem List   Diagnosis Date Noted  . Real time reverse transcriptase PCR positive for COVID-19 virus 07/09/2019  . Infection requiring airborne isolation precautions 07/09/2019  . 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 Macular degeneration 12/09/2018  . Psychoses ([place]) 12/19/2017  . Insomnia 12/19/2017  . Urinary tract infection due to extended-spectrum beta lactamase (ESBL) producing Escherichia coli 11/17/2017  . Dysphagia, oropharyngeal 11/15/2017  . Altered mental status 11/10/2017  . Acute metabolic encephalopathy [id number]  . Mania ([place]) 06/21/2017  . Hypomagnesemia 06/19/2017  . Essential hypertension 06/18/2017  . Acute encephalopathy 06/17/2017  . Acute lower UTI 06/17/2017  . E. coli UTI 06/17/2017  . Depression 04/24/2017  . Hypothyroidism 10/03/2016  . Paranoia ([place]) 10/03/2016  . LOC (loss of consciousness) ([place]) 09/02/2016  . Occlusion of right carotid artery  09/02/2016  . Dementia with behavioral disturbance ([place]) 09/02/2016  . Asthma with acute exacerbation 08/21/2016  . Atrial fibrillation with RVR ([place]) 08/21/2016  . Elevated INR 08/21/2016  . Chronic systolic heart failure ([place]) 08/21/2016  . Hyperlipidemia 08/21/2016  . Electrolyte abnormality 08/21/2016  . HCAP (healthcare-associated pneumonia) 08/10/2016  . Sepsis ([place]) 08/09/2016  . Coronary atherosclerosis of native coronary artery 01/21/2014  . Obesity 01/17/2014  . Abdominal pain, generalized 10/18/2012  . GERD (gastroesophageal reflux disease)   . Mood disorder ([place])   . Anxiety   . 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[place])   . Stroke ([facility])   . Chest pain 01/07/2012  . Asthma 01/07/2012  . A-fib ([place]) 01/07/2012  . 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 Anemia 01/07/2012  . ANEMIA 07/29/2010  . BARRETTS ESOPHAGUS 06/04/2010    CMP     Component Value Date/Time   NA 139 09/04/2019   K 4.4 09/04/2019   CL 99 07/31/2019 1458   CO2 25 07/31/2019 1458   GLUCOSE 107 (H) 07/31/2019 1458   BUN 35 (A) 09/04/2019   CREATININE 1.4 (A) 09/04/2019   CREATININE 1.48 (H) 07/31/2019 1458   CALCIUM 9.5 07/31/2019 1458   PROT 7.2 07/31/2019 1458   ALBUMIN 4.0 07/31/2019 1458   AST 23 07/31/2019 1458   ALT 18 07/31/2019 1458   ALKPHOS 66 07/31/2019 1458   BILITOT 1.2 07/31/2019 1458   GFRNONAA 33 (L) 07/31/2019 1458   GFRAA 39 (L) 07/31/2019 1458   Recent Labs    07/31/19 1458 08/03/19 09/04/19  NA 134* 139 139  K 4.0 4.3 4.4  CL 99  --   --   CO2 25  --   --   GLUCOSE 107*  --   --   BUN 37* 24* 35*  CREATININE 1.48* 1.2* 1.4*  CALCIUM 9.5  --   --    Recent Labs    12/11/18 07/17/19 07/31/19 1458  AST 11* 14 23  ALT 6* 10 18  ALKPHOS 61 60 66  BILITOT  --   --  1.2  PROT  --   --  7.2  ALBUMIN  --   --  4.0   Recent Labs    12/21/18 07/17/19 07/31/19 1458  WBC 5.0 4.2 7.0  NEUTROABS  --  2 5.4  HGB 11.7* 11.2*  12.5  HCT 37 33* 39.0  MCV  --   --  89.7  PLT 162 126* 192   Recent Labs    12/11/18 08/16/19  CHOL 130 140  LDLCALC 64 58  TRIG 84 128   No results found for: [facility] Lab Results  Component Value Date   TSH 5.63 09/04/2019   Lab Results  Component Value Date   HGBA1C 5.9 07/17/2019   Lab Results  Component Value Date   CHOL 140 08/16/2019   HDL 57 08/16/2019   LDLCALC 58 08/16/2019   TRIG 128 08/16/2019   CHOLHDL 1.9 06/19/2017    Significant Diagnostic Results in last 30 days:  No results found.  Assessment and Plan  A-fib ([place]) Stable; continue Xarelto 15 mg daily as prophylaxis and Coreg 3.125 mg twice daily for rate control  Chronic systolic heart failure ([place]) Continues without exacerbation; continue Coreg 3.125 mg twice daily; patient is not on diuretic  Coronary atherosclerosis of native coronary artery No complaints of chest pain or equivalent; continue Coreg 3.125 mg twice daily Imdur 30 mg daily and Xarelto 15 mg daily; patient is on statin    [name], MD

## 2019-09-09 ENCOUNTER — Encounter: Payer: Self-pay | Admitting: Internal Medicine

## 2019-09-09 NOTE — Assessment & Plan Note (Signed)
Stable; continue Xarelto 15 mg daily as prophylaxis and Coreg 3.125 mg twice daily for rate control

## 2019-09-09 NOTE — Assessment & Plan Note (Signed)
Continues without exacerbation; continue Coreg 3.125 mg twice daily; patient is not on diuretic 

## 2019-09-09 NOTE — Assessment & Plan Note (Signed)
No complaints of chest pain or equivalent; continue Coreg 3.125 mg twice daily Imdur 30 mg daily and Xarelto 15 mg daily; patient is on statin

## 2019-09-12 LAB — BASIC METABOLIC PANEL
BUN: 31 — AB (ref 4–21)
Creatinine: 1.2 — AB (ref 0.5–1.1)
Glucose: 143
Potassium: 4.2 (ref 3.4–5.3)
Sodium: 142 (ref 137–147)

## 2019-10-01 ENCOUNTER — Non-Acute Institutional Stay (SKILLED_NURSING_FACILITY): Payer: Medicare Other | Admitting: Internal Medicine

## 2019-10-01 ENCOUNTER — Encounter: Payer: Self-pay | Admitting: Internal Medicine

## 2019-10-01 DIAGNOSIS — G301 Alzheimer's disease with late onset: Secondary | ICD-10-CM

## 2019-10-01 DIAGNOSIS — I480 Paroxysmal atrial fibrillation: Secondary | ICD-10-CM | POA: Diagnosis not present

## 2019-10-01 DIAGNOSIS — F0281 Dementia in other diseases classified elsewhere with behavioral disturbance: Secondary | ICD-10-CM

## 2019-10-01 DIAGNOSIS — F02818 Dementia in other diseases classified elsewhere, unspecified severity, with other behavioral disturbance: Secondary | ICD-10-CM

## 2019-10-01 DIAGNOSIS — K2270 Barrett's esophagus without dysplasia: Secondary | ICD-10-CM

## 2019-10-01 DIAGNOSIS — E034 Atrophy of thyroid (acquired): Secondary | ICD-10-CM

## 2019-10-01 DIAGNOSIS — I5022 Chronic systolic (congestive) heart failure: Secondary | ICD-10-CM

## 2019-10-01 NOTE — Progress Notes (Signed)
Location:  [place] Room Number: 305-W Place of Service:  SNF [phone]) Provider:  Granville Lewis, PA-C  [name], MD  Patient Care Team: [name], MD as PCP - General (Internal Medicine)  Extended Emergency Contact Information Primary Emergency Contact: Tyner,[name] Address: [address] [place], [place] [address] [name] Work Phone: [phone] Mobile Phone: [phone] Relation: Daughter Secondary Emergency Contact: Birdie, Clune Mobile Phone: [phone] Relation: Son  Code Status:  DNR Goals of care: Advanced Directive information Advanced Directives 09/04/2019  Does Patient Have a Medical Advance Directive? Yes  Type of Advance Directive Out of facility DNR (pink MOST or yellow form)  Does patient want to make changes to medical advance directive? No - Patient declined  Copy of [place] in Chart? -  Would patient like information on creating a medical advance directive? -  Pre-existing out of facility DNR order (yellow form or pink MOST form) Yellow form placed in chart (order not valid for inpatient use)     Chief Complaint  Patient presents with  . Medical Management of Chronic Issues    Routine [facility] visit  . Best Practice Recommendations    DEXA, Tdap   Routine visit for medical management of chronic medical conditions including atrial fibrillation-systolic CHF-coronary artery disease-history of CVA-GERD-hypothyroidism and hyperlipidemia.  As well as dementia with behaviors   HPI:  Pt is an 78 y.o. female seen today for medical management of chronic diseases.  As noted above.  Nursing staff is not really reported any recent acute issues and she has no complaints today.  At times she will have increased behaviors and actually went to the ER back in September with behaviors but apparently was at her baseline more when she went to the ER and labs did not really show  anything real remarkable.  She has returned to the facility and has been fairly stable since then apparently does have days where she kind of stays in bed and other days where she is a bit more active.  She is followed by the psychiatric nurse practitioner she is on Seroquel twice a day 200 mg in the morning-and 500 mg later in the day she is also on Zoloft 75 mg a day and has trazodone at night.  In regards to her other issues she is on Xarelto with a history of atrial fibrillation which appears to be rate controlled on Coreg 3.125 mg twice daily at times she will have pulses in the 50s but is asymptomatic.  She also has a history of systolic CHF she is not on any diuretic again she is on the beta-blocker Coreg she is not on lisinopril because of renal issues and renal insufficiency-this appears to be compensated at this point.  She also has a history of GERD with esophagitis and continues on Protonix.  In regards to hypothyroidism she is on Synthroid-she does have an update TSH due in December-TSH was 5.63 last month free T4 was 0.82.       Past Medical History:  Diagnosis Date  . A-fib ([place]) 01/07/2012  . ANEMIA 07/29/2010   Qualifier: Diagnosis of  By: Bobby Rumpf CMA (AAMA), Patty    . Angina pectoris ([place])   . Anxiety   . Asthma 01/07/2012  . Barrett's esophagus   . Bronchitis   . 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 Chest pain   .  Coronary artery disease    right carotid occlusion   . Dementia with behavioral disturbance ([place]) 09/02/2016  . Depression   . Dysrhythmia    atrial fibrillation  . GERD (gastroesophageal reflux disease)   . 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 Hyperlipidemia   . Hypertension   . Mood disorder ([place])   . Paranoia ([place]) 10/03/2016  . Recurrent upper respiratory infection (URI)   . Stroke [facility])    2007, after left endarterectomy   Past Surgical History:  Procedure Laterality Date  . ABDOMINAL HYSTERECTOMY    . BALLOON DILATION  10/18/2012    Procedure: BALLOON DILATION;  Surgeon: Lear Ng, MD;  Location: [facility];  Service: Endoscopy;  Laterality: N/A;  . CARDIAC CATHETERIZATION    . CAROTID ENDARTERECTOMY     left, complicated by stroke  . CHOLECYSTECTOMY     incidental at time of colectomy  . COLONOSCOPY  12/01/2011   Procedure: COLONOSCOPY;  Surgeon: Lear Ng, MD;  [facility]: [facility];  Service: Endoscopy;  Laterality: N/A;  . COLONOSCOPY  10/18/2012   Procedure: COLONOSCOPY;  Surgeon: Lear Ng, MD;  [facility];  Service: Endoscopy;  Laterality: N/A;  colonic dilation  . ESOPHAGOGASTRODUODENOSCOPY  12/01/2011   Procedure: ESOPHAGOGASTRODUODENOSCOPY (EGD);  Surgeon: Lear Ng, MD;  Location: [facility];  Service: Endoscopy;  Laterality: N/A;  . HOT HEMOSTASIS  12/01/2011   Procedure: HOT HEMOSTASIS (ARGON PLASMA COAGULATION/BICAP);  Surgeon: Lear Ng, MD;  Location: [facility];  Service: Endoscopy;  Laterality: N/A;  . 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 acetaminophen (TYLENOL) 325 MG tablet Take 650 mg by mouth every 6 (six) hours as needed for mild pain or moderate pain.  Marland Kitchen alendronate (FOSAMAX) 70 MG tablet Take 70 mg by mouth every Wednesday. Take with a full glass of water on an empty stomach.  . carvedilol (COREG) 3.125 MG tablet Take 1 tablet (3.125 mg total) by mouth 2 (two) times daily with a meal.  . Cholecalciferol (VITAMIN D3) 1.25 MG (50000 UT) CAPS Take 1 capsule by mouth once a week.   . Ensure (ENSURE) Take 237 mLs by mouth 2 (two) times daily.  . isosorbide mononitrate (IMDUR) 30 MG 24 hr tablet Take 30 mg by mouth daily.  Marland Kitchen levothyroxine (SYNTHROID) 50 MCG tablet Take 50 mcg by mouth daily before breakfast.  . mirtazapine (REMERON) 15 MG tablet Take 7.5 mg by mouth at bedtime. 1/2 TAB  . pantoprazole  sodium (PROTONIX) 40 mg/20 mL PACK Take 40 mg by mouth daily. Mix 1 packet on 1 teaspoonful of applesauce or 5 mL apple juice and take daily, DO NOT ADMINISTER WITH ANY OTHER LIQUIDS OR FOODS  . QUEtiapine (SEROQUEL XR) 400 MG 24 hr tablet Take 400 mg by mouth at bedtime. Take 1 tablet along with a 100 mg tablet to = 500 mg  . QUEtiapine (SEROQUEL) 100 MG tablet Take 100 mg by mouth at bedtime. Take along with a 400 mg tablet to = 500 mg  . QUEtiapine (SEROQUEL) 200 MG tablet Take 200 mg by mouth every morning.  . rivaroxaban (XARELTO) 10 MG TABS tablet Take 15 mg by mouth daily. 1 and 1/2 tablets to = 15 mg  . sertraline (ZOLOFT) 25 MG tablet Take 25 mg by mouth daily. Take 1 tablet, along with Sertraline  50 mg to equal 75mg , by mouth daily  . sertraline (ZOLOFT) 50 MG tablet Take 75 mg by mouth daily. TAKE 1 AND 1/2 TABLETS (75MG ) BY MOUTH ONCE DAILY MOOD/DEPRESSION  . simvastatin (ZOCOR) 10 MG tablet Take 10 mg by mouth at bedtime.  . traZODone (DESYREL) 100 MG tablet Take 100 mg by mouth at bedtime.   . [DISCONTINUED] levothyroxine (SYNTHROID, LEVOTHROID) 25 MCG tablet Take 25 mcg by mouth daily before breakfast.    No facility-administered encounter medications on file as of 10/01/2019.     Review of Systems   General is not complaining of any fever or chills.  Skin is not complaining of rashes itching or diaphoresis.  Head ears eyes nose mouth and throat is not complaining of visual changes or sore throat.  Respiratory does not complain of being short of breath or having a cough.  Cardiac does not complain of chest pain or edema.  GI is not complaining of abdominal discomfort nausea vomiting diarrhea constipation-appears to have somewhat of a spotty appetite she is eating her dessert today but does not appear to be interested in the main-.  GU is not complaining of dysuria.  Musculoskeletal does not complain of joint pain currently.  Neurologic is not complaining of dizziness  headache numbness.  Psych does have a history of dementia with behaviors at this point appears to be controlled with apparently occasional behavioral changes she is not complaining of being depressed or anxious she is pleasant appropriate cooperative  Immunization History  Administered Date(s) Administered  . Influenza-Unspecified 08/26/2016, 10/21/2017, 08/30/2019  . PPD Test 08/16/2016  . Pneumococcal Conjugate-13 07/15/2018  . Pneumococcal Polysaccharide-23 01/08/2012   Pertinent  Health Maintenance Due  Topic Date Due  . DEXA SCAN  11/30/2019 (Originally 08/15/2004)  . INFLUENZA VACCINE  Completed  . PNA vac Low Risk Adult  Completed   Fall Risk  07/13/2018 07/07/2017  Falls in the past year? Yes No  Number falls in past yr: 1 -  Injury with Fall? No -   Functional Status Survey:    Vitals:   10/01/19 1044  BP: 91/60  Pulse: 73  Resp: 18  Temp: 98.6 F (37 C)  TempSrc: Oral  SpO2: 96%  Weight: 152 lb 3.2 oz (69 kg)  Height: 5' (1.524 m)  -Updated blood pressure is 103/61 Body mass index is 29.72 kg/m. Physical Exam In general this is a pleasant elderly female in no distress sitting comfortably on the side of her bed.  Her skin is warm and dry.  Eyes visual acuity appears to be intact sclera and conjunctive are clear.  Oropharynx is clear mucous membranes moist.  Chest is clear to auscultation there is no labored breathing.  Air entry is somewhat shallow.  Heart is regular irregular rate and rhythm without murmur gallop or rub she does not really have significant lower extremity edema.  Abdomen is soft nontender with positive bowel sounds it is somewhat obese.  Musculoskeletal does move all extremities x4 at baseline she is able to stand without assistance but is somewhat unsteady.  Neurologic is grossly intact cannot really appreciate lateralizing findings her speech is clear.  Psych she is pleasant and appropriate oriented to self.     Labs reviewed:  Recent Labs    07/31/19 1458 08/03/19 09/04/19 09/12/19  NA 134* 139 139 142  K 4.0 4.3 4.4 4.2  CL 99  --   --   --   CO2 25  --   --   --  GLUCOSE 107*  --   --   --   BUN 37* 24* 35* 31*  CREATININE 1.48* 1.2* 1.4* 1.2*  CALCIUM 9.5  --   --   --    Recent Labs    12/11/18 07/17/19 07/31/19 1458  AST 11* 14 23  ALT 6* 10 18  ALKPHOS 61 60 66  BILITOT  --   --  1.2  PROT  --   --  7.2  ALBUMIN  --   --  4.0   Recent Labs    12/21/18 07/17/19 07/31/19 1458  WBC 5.0 4.2 7.0  NEUTROABS  --  2 5.4  HGB 11.7* 11.2* 12.5  HCT 37 33* 39.0  MCV  --   --  89.7  PLT 162 126* 192   Lab Results  Component Value Date   TSH 5.63 09/04/2019   Lab Results  Component Value Date   HGBA1C 5.9 07/17/2019   Lab Results  Component Value Date   CHOL 140 08/16/2019   HDL 57 08/16/2019   LDLCALC 58 08/16/2019   TRIG 128 08/16/2019   CHOLHDL 1.9 06/19/2017    Significant Diagnostic Results in last 30 days:  No results found.  Assessment/Plan  #1 history of dementia with behaviors at this point appears to be fairly well controlled she is followed by psychiatry as well her Seroquel has been increased to 200 mg in the a.m. and 500 mg later in the day she is also on Zoloft 75 mg a day she is also on Remeron I suspect this is also for appetite stimulation and she does have trazodone at night.  2.-History of atrial fibrillation appears to be rate controlled on Coreg 3.125 mg twice daily at times she will be somewhat bradycardic but has been asymptomatic at this point monitor.-She is on Xarelto for anticoagulation  3.  History of systolic CHF appears to be compensated despite not being on a diuretic she does not show evidence of increased edema or chest congestion she does continue on Coreg she is not on an ACE inhibitor because of renal issues.  4.  Coronary artery disease again appears to be fairly stable on Coreg she is also on Imdur 30 mg a day and continues on aspirin  #5  history of GERD with Barrett's esophagitis she is on Protonix 40 mg a day this has not been an issue recently to my knowledge.  6-history of CVA again she continues on anticoagulation aspirin-Xarelto as well as a statin Zocor-LDL was 58 on September lab.  7.  History of hypothyroidism she is on Synthroid this appears to have been recently increased to 50 mcg a day-TSH was 5.63 last month free T4 was 0.82-update thyroid studies are due in December.  8.  History of hyperlipidemia as noted above she is on Zocor 10 mg a day LDL was 58 in September.  9.-History of osteoporosis she continues on Fosamax as well as vitamin D.  #10 hypertension-she is on low-dose Coreg per chart review she has quite variable blood pressure readings earlier today was 91/60 but later today systolic was [id number] I do note at times she has systolics in the [id number] as well-talking with nursing they feel these variations are sometimes due to her activity level when she is resting very comfortably and not moving much her blood pressure tends to go lower when she is up moving it goes borderline high-at this point will monitor I do not see consistent highs or lows  [id number]  Of note will discuss with nursing about status of Tdap inoculation as well as her DEXA scan   '

## 2019-10-02 ENCOUNTER — Encounter: Payer: Self-pay | Admitting: Internal Medicine

## 2019-10-09 ENCOUNTER — Emergency Department (HOSPITAL_COMMUNITY): Payer: Medicare Other

## 2019-10-09 ENCOUNTER — Other Ambulatory Visit: Payer: Self-pay

## 2019-10-09 ENCOUNTER — Emergency Department (HOSPITAL_COMMUNITY)
Admission: EM | Admit: 2019-10-09 | Discharge: 2019-10-10 | Disposition: A | Discharge status: Home / Self Care | Payer: Medicare Other | Attending: Emergency Medicine | Admitting: Emergency Medicine

## 2019-10-09 ENCOUNTER — Encounter (HOSPITAL_COMMUNITY): Payer: Self-pay | Admitting: Emergency Medicine

## 2019-10-09 ENCOUNTER — Non-Acute Institutional Stay (SKILLED_NURSING_FACILITY): Payer: Medicare Other | Admitting: Internal Medicine

## 2019-10-09 ENCOUNTER — Encounter: Payer: Self-pay | Admitting: Internal Medicine

## 2019-10-09 DIAGNOSIS — I110 Hypertensive heart disease with heart failure: Secondary | ICD-10-CM | POA: Diagnosis not present

## 2019-10-09 DIAGNOSIS — S32010A Wedge compression fracture of first lumbar vertebra, initial encounter for closed fracture: Secondary | ICD-10-CM | POA: Diagnosis not present

## 2019-10-09 DIAGNOSIS — J45909 Unspecified asthma, uncomplicated: Secondary | ICD-10-CM | POA: Insufficient documentation

## 2019-10-09 DIAGNOSIS — I2510 Atherosclerotic heart disease of native coronary artery without angina pectoris: Secondary | ICD-10-CM | POA: Insufficient documentation

## 2019-10-09 DIAGNOSIS — Y939 Activity, unspecified: Secondary | ICD-10-CM | POA: Insufficient documentation

## 2019-10-09 DIAGNOSIS — E039 Hypothyroidism, unspecified: Secondary | ICD-10-CM | POA: Diagnosis not present

## 2019-10-09 DIAGNOSIS — I714 Abdominal aortic aneurysm, without rupture, unspecified: Secondary | ICD-10-CM

## 2019-10-09 DIAGNOSIS — M549 Dorsalgia, unspecified: Secondary | ICD-10-CM

## 2019-10-09 DIAGNOSIS — M546 Pain in thoracic spine: Secondary | ICD-10-CM

## 2019-10-09 DIAGNOSIS — W19XXXA Unspecified fall, initial encounter: Secondary | ICD-10-CM | POA: Insufficient documentation

## 2019-10-09 DIAGNOSIS — I25119 Atherosclerotic heart disease of native coronary artery with unspecified angina pectoris: Secondary | ICD-10-CM

## 2019-10-09 DIAGNOSIS — Z87891 Personal history of nicotine dependence: Secondary | ICD-10-CM | POA: Diagnosis not present

## 2019-10-09 DIAGNOSIS — F0390 Unspecified dementia without behavioral disturbance: Secondary | ICD-10-CM | POA: Insufficient documentation

## 2019-10-09 DIAGNOSIS — S3992XA Unspecified injury of lower back, initial encounter: Secondary | ICD-10-CM | POA: Diagnosis present

## 2019-10-09 DIAGNOSIS — Z8673 Personal history of transient ischemic attack (TIA), and cerebral infarction without residual deficits: Secondary | ICD-10-CM | POA: Insufficient documentation

## 2019-10-09 DIAGNOSIS — I509 Heart failure, unspecified: Secondary | ICD-10-CM | POA: Diagnosis not present

## 2019-10-09 DIAGNOSIS — Z7901 Long term (current) use of anticoagulants: Secondary | ICD-10-CM | POA: Insufficient documentation

## 2019-10-09 DIAGNOSIS — Y92129 Unspecified place in nursing home as the place of occurrence of the external cause: Secondary | ICD-10-CM | POA: Diagnosis not present

## 2019-10-09 DIAGNOSIS — Y999 Unspecified external cause status: Secondary | ICD-10-CM | POA: Diagnosis not present

## 2019-10-09 DIAGNOSIS — Z79899 Other long term (current) drug therapy: Secondary | ICD-10-CM | POA: Diagnosis not present

## 2019-10-09 LAB — TYPE AND SCREEN
ABO/RH(D): A POS
Antibody Screen: NEGATIVE

## 2019-10-09 LAB — COMPREHENSIVE METABOLIC PANEL
ALT: 14 U/L (ref 0–44)
AST: 17 U/L (ref 15–41)
Albumin: 3.4 g/dL — ABNORMAL LOW (ref 3.5–5.0)
Alkaline Phosphatase: 65 U/L (ref 38–126)
Anion gap: 10 (ref 5–15)
BUN: 26 mg/dL — ABNORMAL HIGH (ref 8–23)
CO2: 23 mmol/L (ref 22–32)
Calcium: 9.5 mg/dL (ref 8.9–10.3)
Chloride: 103 mmol/L (ref 98–111)
Creatinine, Ser: 1.27 mg/dL — ABNORMAL HIGH (ref 0.44–1.00)
GFR calc Af Amer: 46 mL/min — ABNORMAL LOW (ref >60)
GFR calc non Af Amer: 40 mL/min — ABNORMAL LOW (ref >60)
Glucose, Bld: 121 mg/dL — ABNORMAL HIGH (ref 70–99)
Potassium: 4.1 mmol/L (ref 3.5–5.1)
Sodium: 136 mmol/L (ref 135–145)
Total Bilirubin: 0.4 mg/dL (ref 0.3–1.2)
Total Protein: 6.2 g/dL — ABNORMAL LOW (ref 6.5–8.1)

## 2019-10-09 LAB — CBC WITH DIFFERENTIAL/PLATELET
Abs Immature Granulocytes: 0.03 10*3/uL (ref 0.00–0.07)
Basophils Absolute: 0 10*3/uL (ref 0.0–0.1)
Basophils Relative: 0 %
Eosinophils Absolute: 0.2 10*3/uL (ref 0.0–0.5)
Eosinophils Relative: 2 %
HCT: 38.7 % (ref 36.0–46.0)
Hemoglobin: 12.5 g/dL (ref 12.0–15.0)
Immature Granulocytes: 1 %
Lymphocytes Relative: 16 %
Lymphs Abs: 1 10*3/uL (ref 0.7–4.0)
MCH: 28.8 pg (ref 26.0–34.0)
MCHC: 32.3 g/dL (ref 30.0–36.0)
MCV: 89.2 fL (ref 80.0–100.0)
Monocytes Absolute: 0.7 10*3/uL (ref 0.1–1.0)
Monocytes Relative: 11 %
Neutro Abs: 4.3 10*3/uL (ref 1.7–7.7)
Neutrophils Relative %: 70 %
Platelets: 143 10*3/uL — ABNORMAL LOW (ref 150–400)
RBC: 4.34 MIL/uL (ref 3.87–5.11)
RDW: 13 % (ref 11.5–15.5)
WBC: 6.2 10*3/uL (ref 4.0–10.5)
nRBC: 0 % (ref 0.0–0.2)

## 2019-10-09 LAB — ABO/RH: ABO/RH(D): A POS

## 2019-10-09 LAB — LIPASE, BLOOD: Lipase: 20 U/L (ref 11–51)

## 2019-10-09 MED ORDER — IOHEXOL 300 MG/ML  SOLN
80.0000 mL | Freq: Once | INTRAMUSCULAR | Status: AC | PRN
  Administered 2019-10-09: 20:00:00 80 mL via INTRAVENOUS

## 2019-10-09 NOTE — Discharge Instructions (Addendum)
Return for any problem.  Follow-up with your regular care provider as instructed.    You may follow-up with Dr. Kathyrn Sheriff in 2 to 3 weeks regarding your L1 compression fracture discovered on CT imaging tonight.  No evidence was found on your work-up today of an abdominal aortic aneurysm.

## 2019-10-09 NOTE — ED Triage Notes (Signed)
Pt presents from [facility] with EMS for evaluation of possible AAA and hernia. Xrays obtained to f/u a fall last Friday revealed possible AAA, prompting EMS call. Xrays neg for fx in low/mid back effected by fall. Pt also has a hernia which has been more painful and larger x 1 week, nausea x3 days without v/d.  H/o afib, takes betablockers

## 2019-10-09 NOTE — Progress Notes (Signed)
This is an acute visit.  Level care skilled.  [facility]  Chief complaint acute visit secondary back pain-findings of abdominal aortic aneurysm.  History of present illness.  Patient is an 78 year old female who is a long-term resident of this facility.  She has a history of atrial fibrillation as well as systolic CHF coronary artery disease history of CVA as well as GERD hypothyroidism hyperlipidemia-she also has a history of dementia with behaviors.  Apparently she fell over the weekend and was found on her back-apparently did not complain of any initial injuries.  However over the last day or so apparently she has had increasing back pain which per nursing staff is unusual for her.  We did order x-rays of the thoracic and lumbar spine which did not show any acute fracture or dislocation-in regards to lumbar spine it did show degenerative disc disease between L4 and L5-and first-degree anterior spondylolisthesis of the L3-L4  It also showed a suspected aneurysmal dilation of the abdominal aortic about 5.6 cm. It appears that this is may be new  I do note a CT scan of the abdomen]  June 2019 showed extensive aortic iliac atherosclerosis but did not mention an aneurysm  .   Her vital signs appear to be stable she does have variable blood pressures but this is not new blood pressure this afternoon is 139/75.  She is lying in bed however he continues to complain of what appears to be fairly acute back pain.   Past Medical History:  Diagnosis Date  . A-fib ([place]) 01/07/2012  . ANEMIA 07/29/2010   Qualifier: Diagnosis of  By: Bobby Rumpf CMA (AAMA), [place]    . Angina pectoris ([place])   . Anxiety   . Asthma 01/07/2012  . Barrett's esophagus   . Bronchitis   . 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 Chest pain   . Coronary artery disease    right carotid occlusion   . Dementia with behavioral disturbance ([place]) 09/02/2016  . Depression   . Dysrhythmia    atrial  fibrillation  . GERD (gastroesophageal reflux disease)   . 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 Hyperlipidemia   . Hypertension   . Mood disorder ([place])   . Paranoia ([place]) 10/03/2016  . Recurrent upper respiratory infection (URI)   . Stroke [facility])    2007, after left endarterectomy        Past Surgical History:  Procedure Laterality Date  . ABDOMINAL HYSTERECTOMY    . BALLOON DILATION  10/18/2012   Procedure: BALLOON DILATION;  Surgeon: Lear Ng, MD;  [facility];  Service: Endoscopy;  Laterality: N/A;  . CARDIAC CATHETERIZATION    . CAROTID ENDARTERECTOMY     left, complicated by stroke  . CHOLECYSTECTOMY     incidental at time of colectomy  . COLONOSCOPY  12/01/2011   Procedure: COLONOSCOPY;  Surgeon: Lear Ng, MD;  [facility];  Service: Endoscopy;  Laterality: N/A;  . COLONOSCOPY  10/18/2012   Procedure: COLONOSCOPY;  Surgeon: Lear Ng, MD;  [facility];  Service: Endoscopy;  Laterality: N/A;  colonic dilation  . ESOPHAGOGASTRODUODENOSCOPY  12/01/2011   Procedure: ESOPHAGOGASTRODUODENOSCOPY (EGD);  Surgeon: Lear Ng, MD;  Location: [facility];  Service: Endoscopy;  Laterality: N/A;  . HOT HEMOSTASIS  12/01/2011   Procedure: HOT HEMOSTASIS (ARGON PLASMA COAGULATION/BICAP);  Surgeon: Lear Ng, MD;  Location: [facility];  Service: Endoscopy;  Laterality: N/A;  . 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 acetaminophen (TYLENOL) 325 MG tablet Take 650 mg by mouth every 6 (six) hours as needed for mild pain or moderate pain.  Marland Kitchen alendronate (FOSAMAX) 70 MG tablet Take 70 mg by mouth every Wednesday. Take with a full glass of water on an empty stomach.  . carvedilol (COREG) 3.125 MG tablet Take 1 tablet (3.125 mg total) by mouth 2 (two) times daily with a  meal.  . Cholecalciferol (VITAMIN D3) 1.25 MG (50000 UT) CAPS Take 1 capsule by mouth once a week.   . Ensure (ENSURE) Take 237 mLs by mouth 2 (two) times daily.  . isosorbide mononitrate (IMDUR) 30 MG 24 hr tablet Take 30 mg by mouth daily.  Marland Kitchen levothyroxine (SYNTHROID) 50 MCG tablet Take 50 mcg by mouth daily before breakfast.  . mirtazapine (REMERON) 15 MG tablet Take 7.5 mg by mouth at bedtime. 1/2 TAB  . pantoprazole sodium (PROTONIX) 40 mg/20 mL PACK Take 40 mg by mouth daily. Mix 1 packet on 1 teaspoonful of applesauce or 5 mL apple juice and take daily, DO NOT ADMINISTER WITH ANY OTHER LIQUIDS OR FOODS  . QUEtiapine (SEROQUEL XR) 400 MG 24 hr tablet Take 400 mg by mouth at bedtime. Take 1 tablet along with a 100 mg tablet to = 500 mg  . QUEtiapine (SEROQUEL) 100 MG tablet Take 100 mg by mouth at bedtime. Take along with a 400 mg tablet to = 500 mg  . QUEtiapine (SEROQUEL) 200 MG tablet Take 200 mg by mouth every morning.  . rivaroxaban (XARELTO) 10 MG TABS tablet Take 15 mg by mouth daily. 1 and 1/2 tablets to = 15 mg  . sertraline (ZOLOFT) 25 MG tablet Take 25 mg by mouth daily. Take 1 tablet, along with Sertraline 50 mg to equal 75mg , by mouth daily  . sertraline (ZOLOFT) 50 MG tablet Take 75 mg by mouth daily. TAKE 1 AND 1/2 TABLETS (75MG ) BY MOUTH ONCE DAILY MOOD/DEPRESSION  . simvastatin (ZOCOR) 10 MG tablet Take 10 mg by mouth at bedtime.  . traZODone (DESYREL) 100 MG tablet Take 100 mg by mouth at bedtime.   . [DISCONTINUED] levothyroxine (SYNTHROID, LEVOTHROID) 25 MCG tablet Take 25 mcg by mouth daily before breakfast.    No facility-administered encounter medications on file as of 10/01/2019.              Immunization History  Administered Date(s) Administered  . Influenza-Unspecified 08/26/2016, 10/21/2017, 08/30/2019  . PPD Test 08/16/2016  . Pneumococcal Conjugate-13 07/15/2018  . Pneumococcal Polysaccharide-23 01/08/2012       Pertinent  Health Maintenance  Due  Topic Date Due  . DEXA SCAN  11/30/2019 (Originally 08/15/2004)  . INFLUENZA VACCINE  Completed  . PNA vac Low Risk Adult  Completed   Fall Risk  07/13/2018 07/07/2017  Falls in the past year? Yes No  Number falls in past yr: 1 -  Injury with Fall? No -   Tobacco Use  . Smoking status: Former Smoker    Quit date: 04/09/2003    Years since quitting: 16.4  . Smokeless tobacco: Never Used  Substance Use Topics  . Alcohol use: No   Review of systems.  Limited secondary to dementia  In general she is not complaining of fever or chills.  Skin does not complain of rashes or itching.  Head ears eyes nose mouth and throat  does not complain of visual changes or sore throat.  Respiratory does not complain of being short of breath or having a cough.  Cardiac is not complaining of chest pain or increased edema.  GI does not really complain of abdominal pain pain appears to be more so in her back does not complain of nausea or vomiting diarrhea constipation.  GU does not complain of dysuria.  Musculoskeletal significant for back pain this is more  Thoracic  mid and lower back-she described it somewhat as a radiating pain.  Neurologic does not complain of dizziness headache or numbness.  And psych does have a history of some dementia and behaviors but at this point appears to be at baseline.    Physical exam.  Temperature is 97.2 pulse 60 respirations 18 blood pressure 139/75 oxygen saturation is 94% on room air.  In general this is a pleasant elderly female in no acute distress but she continues to complain of significant back pain.  Her skin is warm and dry.  Eyes visual acuity appears grossly intact sclera and conjunctive are clear.  Chest is clear to auscultation with somewhat shallow air entry.  Heart is regular rate and rhythm without murmur gallop or rub she has minimal lower extremity edema.  Abdomen she does have a history of hernias appears to be at  baseline--t is soft nontender somewhat obese.\   Musculoskeletal is able to move all extremities x4 is able to sit up in bed but does have extensive pain in her mid lower back there is pain to palpation of the area I do not see any deformity.  Does not really have pain with flexion extension of the hips bilaterally.  Neurologic appears grossly intact her speech is clear cannot really appreciate lateralizing findings.  Psych she is pleasant and appropriate   Labs.  September 12, 2019.  Sodium 142 potassium 4.2 BUN 30.5 creatinine 1.23.  Free T4 was 0.82.  July 17, 2019.  WBC 4.2 hemoglobin 11.2 platelets 126.  Assessment plan.  Acute back pain with what appears to be a new finding of abdominal aortic aneurysm-per staff this is a unique presentation for her with significant complaints of back pain-x-ray did show degenerative changes which may be contributing to this however with  history of aneurysm and back pain I suspect she will need a fairly urgent abdominal ultrasound-we will send her to the ER for evaluation.  [id number]

## 2019-10-09 NOTE — ED Notes (Signed)
ED tech notes BP 215/105mmHg, notifies RN and EDP.

## 2019-10-09 NOTE — ED Provider Notes (Signed)
[facility] EMERGENCY DEPARTMENT Provider Note   CSN: [id number] Arrival date & time: 10/09/19  1934     History   Chief Complaint Chief Complaint  Patient presents with   Hernia    HPI Cindy [name] is a 78 y.o. female.     78 year old female with prior medical history as detailed below presents for evaluation of back pain.  Patient was sent from her facility for evaluation.  She reportedly had an unwitnessed fall over the weekend.  She has been complaining of low back pain since the fall.  Plain film imaging of her lumbar spine performed earlier today did not demonstrate significant bony abnormality.  However on imaging there was a questionable aortic aneurysm.  Patient is without abdominal pain.  She was sent for further evaluation of the possible AAA.  Patient has a valid DNR.  She is fairly comfortable at time of my evaluation.  She declines pain medication for her back.  The history is provided by the patient, medical records and the EMS personnel.  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   Chest pain    Coronary artery disease    right carotid occlusion    Dementia with behavioral disturbance ([place]) 09/02/2016   Depression    Dysrhythmia    atrial fibrillation   GERD (gastroesophageal reflux disease)    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   Hyperlipidemia    Hypertension    Mood disorder ([place])    Paranoia ([place]) 10/03/2016   Recurrent upper respiratory infection (URI)     Stroke ([place])    2007, after left endarterectomy    Patient Active Problem List   Diagnosis Date Noted   Real time reverse transcriptase PCR positive for COVID-19 virus 07/09/2019   Infection requiring airborne isolation precautions 07/09/2019   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   Dementia with behavioral disturbance ([place]) 09/02/2016   Asthma with acute exacerbation 08/21/2016   Atrial fibrillation with RVR ([place]) 08/21/2016   Elevated INR 99991111   Chronic systolic heart failure ([place]) 08/21/2016   Hyperlipidemia 08/21/2016   Electrolyte abnormality 08/21/2016   HCAP (healthcare-associated pneumonia) 08/10/2016   Sepsis ([place]) 08/09/2016   Coronary atherosclerosis of native coronary artery 01/21/2014   Obesity 01/17/2014   Abdominal pain, generalized 10/18/2012   GERD (gastroesophageal reflux disease)  Mood disorder ([place])    Anxiety    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[place])    Stroke ([place])    Chest pain 01/07/2012   Asthma 01/07/2012   A-fib ([place]) 01/07/2012   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   Anemia 01/07/2012   ANEMIA 07/29/2010   BARRETTS ESOPHAGUS 06/04/2010    Past Surgical History:  Procedure Laterality Date     ABDOMINAL HYSTERECTOMY     BALLOON DILATION  10/18/2012   Procedure: BALLOON DILATION;  Surgeon: Lear Ng, MD;  Location: [facility] ENDOSCOPY;  Service: Endoscopy;  Laterality: N/A;   CARDIAC CATHETERIZATION     CAROTID ENDARTERECTOMY     left, complicated by stroke   CHOLECYSTECTOMY     incidental at time of colectomy   COLONOSCOPY  12/01/2011   Procedure: COLONOSCOPY;  Surgeon: Lear Ng, MD;  Location: [facility] ENDOSCOPY;  Service: Endoscopy;  Laterality: N/A;   COLONOSCOPY  10/18/2012   Procedure: COLONOSCOPY;  Surgeon: Lear Ng, MD;  Location: [facility] ENDOSCOPY;  Service: Endoscopy;  Laterality: N/A;  colonic dilation   ESOPHAGOGASTRODUODENOSCOPY  12/01/2011   Procedure: ESOPHAGOGASTRODUODENOSCOPY (EGD);  Surgeon: Lear Ng, MD;  Location: [facility];  Service: Endoscopy;  Laterality: N/A;   HOT HEMOSTASIS  12/01/2011   Procedure: HOT HEMOSTASIS (ARGON PLASMA COAGULATION/BICAP);  Surgeon: Lear Ng, MD;  Location: [facility];  Service: Endoscopy;  Laterality: N/A;   PARTIAL COLECTOMY     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      Home Medications    Prior to Admission medications   Medication Sig Start Date End Date Taking? Authorizing Provider  acetaminophen (TYLENOL) 325 MG tablet Take 650 mg by mouth every 6 (six) hours as needed for mild pain or moderate pain.    [provider]  alendronate (FOSAMAX) 70 MG tablet Take 70 mg by mouth every Wednesday. Take with a full glass of water on an empty stomach.    [provider]  carvedilol (COREG) 3.125 MG tablet Take 1 tablet (3.125 mg total) by mouth 2 (two) times daily with a meal. 08/14/16   Johnson, Clanford L, MD  Cholecalciferol (VITAMIN D3) 1.25 MG (50000 UT) CAPS Take 1 capsule by mouth once a week.     [provider]  Ensure (ENSURE) Take 237 mLs by mouth 2 (two) times daily.     [provider]  isosorbide mononitrate (IMDUR) 30 MG 24 hr tablet Take 30 mg by mouth daily.    [provider]  levothyroxine (SYNTHROID) 50 MCG tablet Take 50 mcg by mouth daily before breakfast.    [provider]  mirtazapine (REMERON) 15 MG tablet Take 7.5 mg by mouth at bedtime. 1/2 TAB    [provider]  pantoprazole sodium (PROTONIX) 40 mg/20 mL PACK Take 40 mg by mouth daily. Mix 1 packet on 1 teaspoonful of applesauce or 5 mL apple juice and take daily, DO NOT ADMINISTER WITH ANY OTHER LIQUIDS OR FOODS    [provider]  QUEtiapine (SEROQUEL XR) 400 MG 24 hr tablet Take 400 mg by mouth at bedtime. Take 1 tablet along with a 100 mg tablet to = 500 mg    [provider]  QUEtiapine (SEROQUEL) 100 MG tablet Take 100 mg by mouth at bedtime. Take along with a 400 mg tablet to = 500 mg    [provider]  QUEtiapine (SEROQUEL) 200 MG tablet Take 200 mg by mouth every morning.    [provider]  rivaroxaban (XARELTO) 10 MG TABS tablet Take 15 mg by mouth daily. 1 and 1/2 tablets to = 15 mg    [provider]  sertraline (ZOLOFT) 25 MG tablet Take 25 mg by mouth daily. Take 1 tablet, along with Sertraline 50 mg to equal 75mg , by mouth daily    [provider]  sertraline (ZOLOFT) 50 MG tablet Take 75 mg by mouth daily. TAKE 1 AND 1/2 TABLETS (75MG ) BY MOUTH ONCE DAILY MOOD/DEPRESSION    [provider]  simvastatin (ZOCOR) 10 MG tablet Take 10 mg by mouth at bedtime.    [provider]  traZODone (DESYREL) 100 MG tablet Take 100 mg by mouth at bedtime.     [provider]    Family History Family History  Problem Relation Age of Onset   Heart disease Mother    Cancer Mother    Heart attack Father    Cancer Sister        ovarian    Social History Social History   Tobacco Use   Smoking status: Former Smoker    Quit date: 04/09/2003    Years since quitting: 16.5     Smokeless tobacco: Never Used  Substance Use Topics   Alcohol use: No   Drug use: No     Allergies   Patient has no known allergies.   Review of Systems Review of Systems  Musculoskeletal: Positive for back pain.  All other systems reviewed and are negative.    Physical Exam Updated Vital Signs BP (!) 155/133    Pulse 66 Comment: Afib   Temp 98.7 F (37.1 C) (Oral)    Resp 14    Ht 5' (1.524 m)    SpO2 95%    BMI 29.72 kg/m   Physical Exam Vitals signs and nursing note reviewed.  Constitutional:      General: She is not in acute distress.    Appearance: Normal appearance. She is well-developed.  HENT:     Head: Normocephalic and atraumatic.  Eyes:     Conjunctiva/sclera: Conjunctivae normal.     Pupils: Pupils are equal, round, and reactive to light.  Neck:     Musculoskeletal: Normal range of motion and neck supple.  Cardiovascular:     Rate and Rhythm: Normal rate and regular rhythm.     Heart sounds: Normal heart sounds.  Pulmonary:     Effort: Pulmonary effort is normal. No respiratory distress.     Breath sounds: Normal breath sounds.  Abdominal:     General: There is no distension.     Palpations: Abdomen is soft.     Tenderness: There is no abdominal tenderness.     Comments: Large non-tender midline abdominal hernia   Musculoskeletal: Normal range of motion.        General: No deformity.     Comments: Mild diffuse lumbar spine tenderness - no step off, no appreciable bony abnormality   Skin:    General: Skin is warm and dry.  Neurological:     Mental Status: She is alert and oriented to person, place, and time.      ED Treatments / Results  Labs (all labs ordered are listed, but only abnormal results are displayed) Labs Reviewed  COMPREHENSIVE METABOLIC PANEL - Abnormal; Notable for the following components:      Result Value   Glucose, Bld  121 (*)    BUN 26 (*)    Creatinine, Ser 1.27 (*)    Total Protein 6.2 (*)    Albumin 3.4 (*)     GFR calc non Af Amer 40 (*)    GFR calc Af Amer 46 (*)    All other components within normal limits  CBC WITH DIFFERENTIAL/PLATELET - Abnormal; Notable for the following components:   Platelets 143 (*)    All other components within normal limits  LIPASE, BLOOD  TYPE AND SCREEN  ABO/RH    EKG EKG Interpretation  Date/Time:  Tuesday October 09 2019 19:46:13 EST Ventricular Rate:  62 PR Interval:    QRS Duration: 97 QT Interval:  442 QTC Calculation: 449 R Axis:   51 Text Interpretation: Atrial fibrillation Nonspecific repol abnormality, diffuse leads Confirmed by Dene Gentry [phone]) on 10/09/2019 7:50:08 PM   Radiology Ct Abdomen Pelvis W Contrast  Result Date: 10/09/2019 CLINICAL DATA:  BACK PAIN. ABDOMINAL PULSATILE MASS WITH AAA SUSPECTED. PAINFUL HERNIA. EXAM: CT ABDOMEN AND PELVIS WITH CONTRAST CT LUMBAR SPINE WITHOUT CONTRAST TECHNIQUE: Multidetector CT imaging of the abdomen and pelvis was performed using the standard protocol following bolus administration of intravenous contrast. Technique: Multiplanar CT images of the lumbar spine were reconstructed from contemporary CT of the Abdomen and Pelvis. CONTRAST:  53mL OMNIPAQUE IOHEXOL 300 MG/ML  SOLN COMPARISON:  CT pelvis dated April 29, 2018. CT dated May 29, 2012. FINDINGS: Lower chest: The lung bases are clear. The heart size is normal. Hepatobiliary: There are innumerable low-attenuation structures throughout the liver. These are statistically most likely to represent benign cysts. Status post cholecystectomy.There is some mild intrahepatic and extrahepatic biliary ductal dilatation without evidence for an obstructing mass. Pancreas: Normal contours without ductal dilatation. No peripancreatic fluid collection. Spleen: No splenic laceration or hematoma. Adrenals/Urinary Tract: --Adrenal glands: No adrenal hemorrhage. --Right kidney/ureter: No hydronephrosis or perinephric hematoma. --Left kidney/ureter: No hydronephrosis or  perinephric hematoma. --Urinary bladder: Unremarkable. Stomach/Bowel: --Stomach/Duodenum: The partially visualized esophagus is patulous and fluid-filled to the level of the mid chest. The upper portions of the esophagus are not visualized on this exam. --Small bowel: No dilatation or inflammation. --Colon: There is a large amount of stool throughout the colon. There are scattered colonic diverticula without CT evidence for diverticulitis. There is a large stool ball in the transverse colon. There is an abdominal wall hernia containing a loop of the transverse colon. There does not appear to be obstruction secondary to this hernia. There is a large amount of stool in the ascending colon. --Appendix: Not visualized. No right lower quadrant inflammation or free fluid. Vascular/Lymphatic: There are extensive atherosclerotic changes throughout the abdominal aorta without evidence for an abdominal aortic aneurysm. There is moderate narrowing at the origin of the SMA and celiac axis without evidence for high-grade stenosis. There is no abdominal aortic aneurysm. There is a moderate grade stenosis at the origin of the right common iliac artery. --No retroperitoneal lymphadenopathy. --No mesenteric lymphadenopathy. --No pelvic or inguinal lymphadenopathy. Reproductive: Status post hysterectomy. No adnexal mass. Other: No ascites or free air. There is a large ventral wall hernia containing a loop of the transverse colon. There are no findings to suggest underlying strangulation or incarceration. Musculoskeletal. There is an acute compression fracture of the superior endplate of the L1 vertebral body. There is no significant retropulsion. There is a subacute fracture through the pedicles of the L3 vertebral body. This results in grade 1 anterolisthesis of L3 on L4. There are degenerative changes  throughout the visualized thoracolumbar spine. IMPRESSION: 1. Acute compression fracture of the superior endplate of the L1  vertebral body without significant retropulsion. 2. Subacute fracture through the pedicles of the L3 vertebral body with grade 1 anterolisthesis of L3 on L4. 3. Large amount of stool throughout the colon with a large stool ball in the splenic flexure of the colon. 4. Large ventral wall hernia containing a loop of transverse colon without evidence for underlying strangulation or obstruction. 5. Atherosclerotic changes of the abdominal aorta without evidence for an abdominal aortic aneurysm. 6. Patulous fluid-filled esophagus to the level of the mid chest. This is similar in appearance when compared to CT dated May 29, 2012. Aortic Atherosclerosis (ICD10-I70.0). Electronically Signed   By: Constance Holster M.D.   On: 10/09/2019 21:08   Ct L-spine No Charge  Result Date: 10/09/2019 CLINICAL DATA:  BACK PAIN. ABDOMINAL PULSATILE MASS WITH AAA SUSPECTED. PAINFUL HERNIA. EXAM: CT ABDOMEN AND PELVIS WITH CONTRAST CT LUMBAR SPINE WITHOUT CONTRAST TECHNIQUE: Multidetector CT imaging of the abdomen and pelvis was performed using the standard protocol following bolus administration of intravenous contrast. Technique: Multiplanar CT images of the lumbar spine were reconstructed from contemporary CT of the Abdomen and Pelvis. CONTRAST:  66mL OMNIPAQUE IOHEXOL 300 MG/ML  SOLN COMPARISON:  CT pelvis dated April 29, 2018. CT dated May 29, 2012. FINDINGS: Lower chest: The lung bases are clear. The heart size is normal. Hepatobiliary: There are innumerable low-attenuation structures throughout the liver. These are statistically most likely to represent benign cysts. Status post cholecystectomy.There is some mild intrahepatic and extrahepatic biliary ductal dilatation without evidence for an obstructing mass. Pancreas: Normal contours without ductal dilatation. No peripancreatic fluid collection. Spleen: No splenic laceration or hematoma. Adrenals/Urinary Tract: --Adrenal glands: No adrenal hemorrhage. --Right kidney/ureter: No  hydronephrosis or perinephric hematoma. --Left kidney/ureter: No hydronephrosis or perinephric hematoma. --Urinary bladder: Unremarkable. Stomach/Bowel: --Stomach/Duodenum: The partially visualized esophagus is patulous and fluid-filled to the level of the mid chest. The upper portions of the esophagus are not visualized on this exam. --Small bowel: No dilatation or inflammation. --Colon: There is a large amount of stool throughout the colon. There are scattered colonic diverticula without CT evidence for diverticulitis. There is a large stool ball in the transverse colon. There is an abdominal wall hernia containing a loop of the transverse colon. There does not appear to be obstruction secondary to this hernia. There is a large amount of stool in the ascending colon. --Appendix: Not visualized. No right lower quadrant inflammation or free fluid. Vascular/Lymphatic: There are extensive atherosclerotic changes throughout the abdominal aorta without evidence for an abdominal aortic aneurysm. There is moderate narrowing at the origin of the SMA and celiac axis without evidence for high-grade stenosis. There is no abdominal aortic aneurysm. There is a moderate grade stenosis at the origin of the right common iliac artery. --No retroperitoneal lymphadenopathy. --No mesenteric lymphadenopathy. --No pelvic or inguinal lymphadenopathy. Reproductive: Status post hysterectomy. No adnexal mass. Other: No ascites or free air. There is a large ventral wall hernia containing a loop of the transverse colon. There are no findings to suggest underlying strangulation or incarceration. Musculoskeletal. There is an acute compression fracture of the superior endplate of the L1 vertebral body. There is no significant retropulsion. There is a subacute fracture through the pedicles of the L3 vertebral body. This results in grade 1 anterolisthesis of L3 on L4. There are degenerative changes throughout the visualized thoracolumbar spine.  IMPRESSION: 1. Acute compression fracture of the superior endplate  of the L1 vertebral body without significant retropulsion. 2. Subacute fracture through the pedicles of the L3 vertebral body with grade 1 anterolisthesis of L3 on L4. 3. Large amount of stool throughout the colon with a large stool ball in the splenic flexure of the colon. 4. Large ventral wall hernia containing a loop of transverse colon without evidence for underlying strangulation or obstruction. 5. Atherosclerotic changes of the abdominal aorta without evidence for an abdominal aortic aneurysm. 6. Patulous fluid-filled esophagus to the level of the mid chest. This is similar in appearance when compared to CT dated May 29, 2012. Aortic Atherosclerosis (ICD10-I70.0). Electronically Signed   By: Constance Holster M.D.   On: 10/09/2019 21:08    Procedures Procedures (including critical care time)  Medications Ordered in ED Medications - No data to display   Initial Impression / Assessment and Plan / ED Course  I have reviewed the triage vital signs and the nursing notes.  Pertinent labs & imaging results that were available during my care of the patient were reviewed by me and considered in my medical decision making (see chart for details).        MDM   Screen complete  RIKAYLA GROAH was evaluated in Emergency Department on 10/09/2019 for the symptoms described in the history of present illness. She was evaluated in the context of the global COVID-19 pandemic, which necessitated consideration that the patient might be at risk for infection with the SARS-CoV-2 virus that causes COVID-19. Institutional protocols and algorithms that pertain to the evaluation of patients at risk for COVID-19 are in a state of rapid change based on information released by regulatory bodies including the CDC and federal and state organizations. These policies and algorithms were followed during the patient's care in the ED.  Patient is  presenting for evaluation of back pain and possible AAA.  CT imaging shows no evidence of AAA.  L1 compression fracture that appears to be acute was found on CT imaging.  This is likely the cause of the patient's reported back discomfort.  Case discussed with neurosurgery who agrees with plan for outpatient management with follow-up with Dr. Kathyrn Sheriff in 2 to 3 weeks.  Patient will be discharge with TLSO brace for comfort.   Patient understands need for close follow-up.  Strict return precautions given and understood.   Final Clinical Impressions(s) / ED Diagnoses   Final diagnoses:  None    ED Discharge Orders    None       Valarie Merino, MD 10/09/19 [id number]

## 2019-10-10 ENCOUNTER — Other Ambulatory Visit: Payer: Self-pay | Admitting: Internal Medicine

## 2019-10-10 ENCOUNTER — Non-Acute Institutional Stay (SKILLED_NURSING_FACILITY): Payer: Medicare Other | Admitting: Internal Medicine

## 2019-10-10 DIAGNOSIS — S32010D Wedge compression fracture of first lumbar vertebra, subsequent encounter for fracture with routine healing: Secondary | ICD-10-CM | POA: Diagnosis not present

## 2019-10-10 DIAGNOSIS — R52 Pain, unspecified: Secondary | ICD-10-CM | POA: Diagnosis not present

## 2019-10-10 DIAGNOSIS — I10 Essential (primary) hypertension: Secondary | ICD-10-CM | POA: Diagnosis not present

## 2019-10-10 MED ORDER — TRAMADOL HCL 50 MG PO TABS: 50.0000 mg | ORAL_TABLET | Freq: Two times a day (BID) | ORAL | 0 refills | Status: AC

## 2019-10-10 NOTE — ED Notes (Signed)
Patient verbalizes understanding of discharge instructions. Opportunity for questioning and answers were provided. Armband removed by staff, pt discharged from ED. Pt. ambulatory and discharged back to [facility] via [place]

## 2019-10-10 NOTE — ED Notes (Signed)
Pt to be allowed to eat and BP rechecked. OK per EDP for pt to be discharged and transported with elevated blood pressure.

## 2019-10-10 NOTE — ED Notes (Signed)
PT given sandwich per MD will re-check BP when finished.

## 2019-10-10 NOTE — ED Notes (Signed)
Spoke to pt family via phone (Ms. Tyner), updated about plan and care, aware that [facility] will transport home

## 2019-10-10 NOTE — Telephone Encounter (Signed)
Pended to [employer]

## 2019-10-10 NOTE — Progress Notes (Signed)
This is an acute visit.  Level care skilled.  Facility [facility]  Chief complaint acute visit follow-up ER visit for acute back pain-with x-rays showing possible abdominal aortic aneurysm.--- Follow-up x-rays in the ER positive for an L1 compression fracture  History of present illness.  Patient is an 78 year old female who is a long-term resident of facility.-She apparently fell over the weekend and later complained of increased back pain.  I did see her yesterday and ordered a mobile x-ray which came back showing degenerative changes and a possible abdominal aortic aneurysm of 5.6 cm which was a new finding.  Out of concern for the acute back painand abdominal aortic aneurysm she was sent to the ER where CT did NOT show the aneurysm but did show an L1 compression fracture thought possibly to be acute.  She was prescribed a back brace with neurosurgery follow-up.  She apparently also was giving pain medication.  She is back in the facility and per staff still has significant pain at time-in fact her blood-pressure was elevated with systolic by machine of over 200 however when I took it manually it was 180 and she was resting in bed and more comfortable I suspect pain was causing the elevated readings.  Which were trending down.  Currently she is not describing acute pain she is lying in bed comfortably she does have orders for Tylenol but staff feels she needs something stronger which appears to be the case.  Past Medical History:  Diagnosis Date  . A-fib ([place]) 01/07/2012  . ANEMIA 07/29/2010   Qualifier: Diagnosis of  By: Bobby Rumpf CMA (AAMA), Patty    . Angina pectoris ([place])   . Anxiety   . Asthma 01/07/2012  . Barrett's esophagus   . Bronchitis   . 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 Chest pain   . Coronary artery disease    right carotid occlusion   . Dementia with behavioral disturbance ([place]) 09/02/2016  . Depression   . Dysrhythmia    atrial fibrillation  . GERD  (gastroesophageal reflux disease)   . 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 Hyperlipidemia   . Hypertension   . Mood disorder ([place])   . Paranoia ([place]) 10/03/2016  . Recurrent upper respiratory infection (URI)   . Stroke [facility])    2007, after left endarterectomy        Patient Active Problem List   Diagnosis Date Noted  . Real time reverse transcriptase PCR positive for COVID-19 virus 07/09/2019  . Infection requiring airborne isolation precautions 07/09/2019  . 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 Macular degeneration 12/09/2018  . Psychoses ([place]) 12/19/2017  . Insomnia 12/19/2017  . Urinary tract infection due to extended-spectrum beta lactamase (ESBL) producing Escherichia coli 11/17/2017  . Dysphagia, oropharyngeal 11/15/2017  . Altered mental status 11/10/2017  . Acute metabolic encephalopathy [id number]  . Mania ([place]) 06/21/2017  . Hypomagnesemia 06/19/2017  . Essential hypertension 06/18/2017  . Acute encephalopathy 06/17/2017  . Acute lower UTI 06/17/2017  . E. coli UTI 06/17/2017  . Depression 04/24/2017  . Hypothyroidism 10/03/2016  . Paranoia ([place]) 10/03/2016  . LOC (loss of consciousness) ([place]) 09/02/2016  . 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 Dementia with behavioral disturbance ([place]) 09/02/2016  . Asthma with acute exacerbation 08/21/2016  . Atrial fibrillation with RVR ([place]) 08/21/2016  . Elevated INR 08/21/2016  . Chronic systolic heart failure ([place]) 08/21/2016  . Hyperlipidemia 08/21/2016  . Electrolyte abnormality 08/21/2016  . HCAP (healthcare-associated  pneumonia) 08/10/2016  . Sepsis ([place]) 08/09/2016  . Coronary atherosclerosis of native coronary artery 01/21/2014  . Obesity 01/17/2014  . Abdominal pain, generalized 10/18/2012  . GERD (gastroesophageal reflux disease)   . Mood disorder ([place])   . Anxiety   . 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[place])   . Stroke ([place])   . Chest  pain 01/07/2012  . Asthma 01/07/2012  . A-fib ([place]) 01/07/2012  . 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 Anemia 01/07/2012  . ANEMIA 07/29/2010  . BARRETTS ESOPHAGUS 06/04/2010         Past Surgical History:  Procedure Laterality Date  . ABDOMINAL HYSTERECTOMY    . BALLOON DILATION  10/18/2012   Procedure: BALLOON DILATION;  Surgeon: Lear Ng, MD;  Location: [facility];  Service: Endoscopy;  Laterality: N/A;  . CARDIAC CATHETERIZATION    . CAROTID ENDARTERECTOMY     left, complicated by stroke  . CHOLECYSTECTOMY     incidental at time of colectomy  . COLONOSCOPY  12/01/2011   Procedure: COLONOSCOPY;  Surgeon: Lear Ng, MD;  Location: [facility];  Service: Endoscopy;  Laterality: N/A;  . COLONOSCOPY  10/18/2012   Procedure: COLONOSCOPY;  Surgeon: Lear Ng, MD;  Location: [facility];  Service: Endoscopy;  Laterality: N/A;  colonic dilation  . ESOPHAGOGASTRODUODENOSCOPY  12/01/2011   Procedure: ESOPHAGOGASTRODUODENOSCOPY (EGD);  Surgeon: Lear Ng, MD;  Location: [facility];  Service: Endoscopy;  Laterality: N/A;  . HOT HEMOSTASIS  12/01/2011   Procedure: HOT HEMOSTASIS (ARGON PLASMA COAGULATION/BICAP);  Surgeon: Lear Ng, MD;  Location: [facility];  Service: Endoscopy;  Laterality: N/A;  . 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      MEDICATIONS  Medication Sig Start Date End Date Taking? Authorizing Provider  acetaminophen (TYLENOL) 325 MG tablet Take 650 mg by mouth every 6 (six) hours as needed for mild pain or moderate pain.    [provider]  alendronate (FOSAMAX) 70 MG tablet Take 70 mg by mouth every Wednesday. Take with a full glass of water on an empty stomach.    [provider]  carvedilol (COREG) 3.125 MG tablet Take 1 tablet (3.125 mg total)  by mouth 2 (two) times daily with a meal. 08/14/16   Johnson, Clanford L, MD  Cholecalciferol (VITAMIN D3) 1.25 MG (50000 UT) CAPS Take 1 capsule by mouth once a week.     [provider]  Ensure (ENSURE) Take 237 mLs by mouth 2 (two) times daily.    [provider]  isosorbide mononitrate (IMDUR) 30 MG 24 hr tablet Take 30 mg by mouth daily.    [provider]  levothyroxine (SYNTHROID) 50 MCG tablet Take 50 mcg by mouth daily before breakfast.    [provider]  mirtazapine (REMERON) 15 MG tablet Take 7.5 mg by mouth at bedtime. 1/2 TAB    [provider]  pantoprazole sodium (PROTONIX) 40 mg/20 mL PACK Take 40 mg by mouth daily. Mix 1 packet on 1 teaspoonful of applesauce or 5 mL apple juice and take daily, DO NOT ADMINISTER WITH ANY OTHER LIQUIDS OR FOODS    [provider]  QUEtiapine (SEROQUEL XR) 400 MG 24 hr tablet Take 400 mg by mouth at bedtime. Take 1 tablet along with a  100 mg tablet to = 500 mg    [provider]  QUEtiapine (SEROQUEL) 100 MG tablet Take 100 mg by mouth at bedtime. Take along with a 400 mg tablet to = 500 mg    [provider]  QUEtiapine (SEROQUEL) 200 MG tablet Take 200 mg by mouth every morning.    [provider]  rivaroxaban (XARELTO) 10 MG TABS tablet Take 15 mg by mouth daily. 1 and 1/2 tablets to = 15 mg    [provider]  sertraline (ZOLOFT) 25 MG tablet Take 25 mg by mouth daily. Take 1 tablet, along with Sertraline 50 mg to equal 75mg , by mouth daily    [provider]  sertraline (ZOLOFT) 50 MG tablet Take 75 mg by mouth daily. TAKE 1 AND 1/2 TABLETS (75MG ) BY MOUTH ONCE DAILY MOOD/DEPRESSION    [provider]  simvastatin (ZOCOR) 10 MG tablet Take 10 mg by mouth at bedtime.    [provider]  traZODone (DESYREL) 100 MG tablet Take 100 mg by mouth at bedtime.     [provider]     Family History      Family History  Problem Relation Age of Onset  . Heart disease Mother   . Cancer Mother   . Heart attack Father   . Cancer Sister        ovarian    Social History Social History   Tobacco Use  . Smoking status: Former Smoker    Quit date: 04/09/2003    Years since quitting: 16.5  . Smokeless tobacco: Never Used  Substance Use Topics  . Alcohol use: No  . Drug use: No     Allergies              Patient has no known allergies.   Review of systems this is somewhat limited secondary to dementia.  General she is not complaining of fever or chills.  Skin does not complain of itching or rashes.  Head ears eyes nose mouth and throat no complaints of visual changes or sore throat.  Respiratory is not complaining of being short of breath or having a cough.  Cardiac is not complaining of chest pain or increased edema from baseline.  GI does not complain of any abdominal pain nausea vomiting diarrhea constipation.  GU no complaints of dysuria.  Musculoskeletal has at times complains of mid low back pain this is more with movement.  Neurologic does not complain of dizziness headache or numbness.  And psych is not complaining of being overtly anxious or depressed.  Physical exam.  Temperature is 97.9 pulse 60 respirations 18 blood pressure 180/94-earlier today was 130/72  In general this is an elderly female who appears to be at her baseline in no distress-at first she was a little agitated but later appeared to be resting comfortably.  Her skin is warm and dry.  Eyes visual acuity appears grossly intact sclera and conjunctive are clear.  Oropharynx clear mucous membranes moist.  Chest is clear to auscultation there is no labored breathing.  Heart is irregular irregular rate and rhythm without murmur gallop or rub.  Her abdomen is soft nontender with positive bowel sounds    musculoskeletal does move all extremities x4 at  baseline she has tenderness palpation of her lower back more in the lumbar area I do not note any acute type deformities.   Neurologic appears grossly intact her speech is clear cannot appreciate lateralizing findings-- touch  Sensation  extremity  lower extremities appears to be intact.  Psych she is oriented to self she is cooperative initially was somewhat agitated but once was she was more comfortable she was pleasant and cooperative.  Labs.  October 09, 2019.  Lipase was 20.  WBC 6.2 hemoglobin 12.5 platelets 143.  Sodium 136 potassium 4.1 BUN 26 creatinine 1.27.  Albumin was 3.4 otherwise liver function tests within normal limits.  Assessment and plan.  1.  History of L1 compression fracture-she will be followed by neurosurgery-for pain management will order tramadol 50 mg twice daily as needed if Tylenol is not effective--will have to keep a close eye on this.  She does have orders for a back brace as well although compliance at times I suspect may be a challenge.  2.  Hypertension-- blood pressure earlier today was 130/72 she does have some variable blood pressures-I suspect the spike this afternoon was due to her pain-- manual reading did show a lower blood pressure-we'll write an order for as needed clonidine 0.1 mg every 8 hours as needed for systolic greater than [id number] or diastolic greater than [id number].  She does continue on Coreg 3.125 mg twice daily routinely  Nursing staff will monitor her blood pressure to make sure it's stable throughout the evening-I suspect it will continue to moderate as long as her pain is controlled  #3 abdominal aortic aneurysm-CT scan in hospital did not show evidence of this   [id number]

## 2019-10-13 ENCOUNTER — Encounter: Payer: Self-pay | Admitting: Internal Medicine

## 2019-10-15 NOTE — Telephone Encounter (Signed)
This encounter was created in error - please disregard.

## 2019-10-18 ENCOUNTER — Encounter (HOSPITAL_COMMUNITY): Payer: Self-pay | Admitting: Emergency Medicine

## 2019-10-18 ENCOUNTER — Emergency Department (HOSPITAL_COMMUNITY)
Admission: EM | Admit: 2019-10-18 | Discharge: 2019-10-30 | Disposition: E | Payer: Medicare Other | Attending: Emergency Medicine | Admitting: Emergency Medicine

## 2019-10-18 DIAGNOSIS — Z66 Do not resuscitate: Secondary | ICD-10-CM | POA: Insufficient documentation

## 2019-10-18 DIAGNOSIS — I469 Cardiac arrest, cause unspecified: Secondary | ICD-10-CM | POA: Insufficient documentation

## 2019-10-18 DIAGNOSIS — R0603 Acute respiratory distress: Secondary | ICD-10-CM

## 2019-10-30 ENCOUNTER — Other Ambulatory Visit: Payer: Self-pay

## 2019-10-30 NOTE — ED Notes (Addendum)
Pt brought in by [facility] for cardiac arrest. EMS advised they were originally called out for respiratory distress. EMS advised the pt was "gurgling" upon their arrival and they started to deliver BVM assisted ventilations due to advanced directives. EMS reports they were told the pt has been SOB for a week now but was refusing to go to the hospital. EMS advised once the pt was unresponsive the facility called for them to transport to the pt to the ED. EMS advised the pt has a state DNR form and they treated to the best of the advanced directive. While in route, EMS reports the pt lost pulses, went into V-fib, and they stopped all efforts. EMS reports they confirmed the pt deceased at 1128 hrs. this date. EMS advised they had a 20g IV established in her left hand and she was in pulmonary edema. EMS advised they had to continue on with transport because they already initiated transport to the hospital. Pt was transferred over to facility bed.

## 2019-10-30 NOTE — Progress Notes (Signed)
Responded to ED call to support daughter.  Mother passed.  Pt was DOA..Daughter escorted to bedside. Daughter spoke with EDP for update.  Provided prayer, listening, emotional and grief support. Remained with family until departure.  Daughter said she wanted Rosezetta Schlatter  to have Body.  Jaclynn Major, [place], [facility], Pager [phone]

## 2019-10-30 NOTE — ED Provider Notes (Signed)
[facility] Emergency Department Provider Note MRN:  [id number]  Arrival date & time: 11/15/19     Chief Complaint   Dead on arrival History of Present Illness   Cindy [name] is a 78 y.o. year-old female with a history of A. fib, CHF presenting to the ED with chief complaint of dead on arrival.  Per report, patient has been having some increased shortness of breath for the past week, became lethargic today, significant respiratory distress.  Patient lost pulses in route with EMS.  DNR and hand, and so EMS ceased efforts and called time of death in route.  Review of Systems  Positive for shortness of breath, cardiac arrest  Patient's Health History    Past Medical History:  Diagnosis Date  . A-fib ([place]) 01/07/2012  . ANEMIA 07/29/2010   Qualifier: Diagnosis of  By: Bobby Rumpf CMA (AAMA), Patty    . Angina pectoris ([place])   . Anxiety   . Asthma 01/07/2012  . Barrett's esophagus   . Bronchitis   . 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 Chest pain   . Coronary artery disease    right carotid occlusion   . Dementia with behavioral disturbance ([place]) 09/02/2016  . Depression   . Dysrhythmia    atrial fibrillation  . GERD (gastroesophageal reflux disease)   . 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 Hyperlipidemia   . Hypertension   . Mood disorder ([place])   . Paranoia ([place]) 10/03/2016  . Recurrent upper respiratory infection (URI)   . Stroke [facility])    2007, after left endarterectomy    Past Surgical History:  Procedure Laterality Date  . ABDOMINAL HYSTERECTOMY    . BALLOON DILATION  10/18/2012   Procedure: BALLOON DILATION;  Surgeon: Lear Ng, MD;  [facility];  Service: Endoscopy;  Laterality: N/A;  . CARDIAC CATHETERIZATION    . CAROTID ENDARTERECTOMY     left, complicated by stroke  . CHOLECYSTECTOMY     incidental at time of colectomy  . COLONOSCOPY  12/01/2011   Procedure: COLONOSCOPY;  Surgeon: Lear Ng, MD;  [facility];  Service: Endoscopy;  Laterality: N/A;  . COLONOSCOPY  10/18/2012   Procedure: COLONOSCOPY;  Surgeon: Lear Ng, MD;  [facility];  Service: Endoscopy;  Laterality: N/A;  colonic dilation  . ESOPHAGOGASTRODUODENOSCOPY  12/01/2011   Procedure: ESOPHAGOGASTRODUODENOSCOPY (EGD);  Surgeon: Lear Ng, MD;  Location: [facility];  Service: Endoscopy;  Laterality: N/A;  . HOT HEMOSTASIS  12/01/2011   Procedure: HOT HEMOSTASIS (ARGON PLASMA COAGULATION/BICAP);  Surgeon: Lear Ng, MD;  Location: [facility];  Service: Endoscopy;  Laterality: N/A;  . 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 Heart disease Mother   . Cancer Mother   . Heart attack Father   . Cancer Sister        ovarian    Social History   Socioeconomic History  . Marital status: Divorced    Spouse name: Not on file  . Number of children: Not on file  . Years of education: Not on file  . Highest education level: Not on file  Occupational History  . Occupation: retired Tour manager  . Financial resource strain: Not hard at all  . Food insecurity    Worry: Never true    Inability: Never true  .  Transportation needs    Medical: No    Non-medical: No  Tobacco Use  . Smoking status: Former Smoker    Quit date: 04/09/2003    Years since quitting: 16.5  . Smokeless tobacco: Never Used  Substance and Sexual Activity  . Alcohol use: No  . Drug use: No  . Sexual activity: Never  Lifestyle  . Physical activity    Days per week: 0 days    Minutes per session: 0 min  . Stress: Not at all  Relationships  . Social Herbalist on phone: Once a week    Gets together: Once a week    Attends religious service: Never    Active member of club or organization: No    Attends meetings of clubs or organizations: Never    Relationship status: Divorced  . Intimate partner  violence    Fear of current or ex partner: No    Emotionally abused: No    Physically abused: No    Forced sexual activity: No  Other Topics Concern  . Not on file  Social History Narrative   Admitted to [facility] 08/16/16   Divorced   Former smoker-stopped 2004   Alcohol none   DNR     Physical Exam  Vital Signs and Nursing Notes reviewed There were no vitals filed for this visit.  CONSTITUTIONAL: Ill-appearing NEURO: Unresponsive EYES: Pupils fixed ENT/NECK:  no LAD, no JVD CARDIO: Pulseless, poorly perfused PULM: Not breathing GI/GU:  normal bowel sounds, non-distended, non-tender MSK/SPINE:  No gross deformities, no edema SKIN:  no rash, atraumatic PSYCH: Unable to assess  Diagnostic and Interventional Summary    EKG Interpretation  Date/Time:    Ventricular Rate:    PR Interval:    QRS Duration:   QT Interval:    QTC Calculation:   R Axis:     Text Interpretation:        Labs Reviewed - No data to display  No orders to display    Medications - No data to display   Procedures  /  Critical Care Procedures  ED Course and Medical Decision Making  I have reviewed the triage vital signs and the nursing notes.  Pertinent labs & imaging results that were available during my care of the patient were reviewed by me and considered in my medical decision making (see below for details).     Patient is deceased on arrival, DNR, brought to the emergency department because she was already in transit with EMS.  Family notified, to be transported to funeral home.    Barth Kirks. [place] Small, [place] [email or web address]  Final Clinical Impressions(s) / ED Diagnoses     ICD-10-CM   1. Respiratory distress  R06.03   2. Cardiac arrest [facility])  I46.9     ED Discharge Orders    None       Discharge Instructions Discussed with and Provided to Patient:   Discharge Instructions   None       Maudie Flakes, MD  Nov 14, 2019 1200

## 2019-10-30 DEATH — deceased
